# Patient Record
Sex: Female | Born: 1940
Health system: Southern US, Community
[De-identification: ages and names within clinical notes are randomized; demographics above are authoritative.]

## PROBLEM LIST (undated history)

## (undated) DIAGNOSIS — M8580 Other specified disorders of bone density and structure, unspecified site: Secondary | ICD-10-CM

## (undated) DIAGNOSIS — I447 Left bundle-branch block, unspecified: Secondary | ICD-10-CM

## (undated) DIAGNOSIS — Z923 Personal history of irradiation: Secondary | ICD-10-CM

## (undated) DIAGNOSIS — I441 Atrioventricular block, second degree: Secondary | ICD-10-CM

## (undated) DIAGNOSIS — C50919 Malignant neoplasm of unspecified site of unspecified female breast: Secondary | ICD-10-CM

## (undated) DIAGNOSIS — C801 Malignant (primary) neoplasm, unspecified: Secondary | ICD-10-CM

## (undated) DIAGNOSIS — IMO0001 Reserved for inherently not codable concepts without codable children: Secondary | ICD-10-CM

## (undated) DIAGNOSIS — I440 Atrioventricular block, first degree: Secondary | ICD-10-CM

## (undated) DIAGNOSIS — M519 Unspecified thoracic, thoracolumbar and lumbosacral intervertebral disc disorder: Secondary | ICD-10-CM

## (undated) DIAGNOSIS — H3530 Unspecified macular degeneration: Secondary | ICD-10-CM

## (undated) DIAGNOSIS — Z9289 Personal history of other medical treatment: Secondary | ICD-10-CM

## (undated) DIAGNOSIS — B019 Varicella without complication: Secondary | ICD-10-CM

## (undated) DIAGNOSIS — M353 Polymyalgia rheumatica: Secondary | ICD-10-CM

## (undated) DIAGNOSIS — F32A Depression, unspecified: Secondary | ICD-10-CM

## (undated) DIAGNOSIS — I499 Cardiac arrhythmia, unspecified: Secondary | ICD-10-CM

## (undated) DIAGNOSIS — F329 Major depressive disorder, single episode, unspecified: Secondary | ICD-10-CM

## (undated) DIAGNOSIS — E785 Hyperlipidemia, unspecified: Secondary | ICD-10-CM

## (undated) DIAGNOSIS — T7840XA Allergy, unspecified, initial encounter: Secondary | ICD-10-CM

## (undated) DIAGNOSIS — I10 Essential (primary) hypertension: Secondary | ICD-10-CM

## (undated) DIAGNOSIS — M1990 Unspecified osteoarthritis, unspecified site: Secondary | ICD-10-CM

## (undated) DIAGNOSIS — IMO0002 Reserved for concepts with insufficient information to code with codable children: Secondary | ICD-10-CM

## (undated) DIAGNOSIS — K219 Gastro-esophageal reflux disease without esophagitis: Secondary | ICD-10-CM

## (undated) DIAGNOSIS — K5900 Constipation, unspecified: Secondary | ICD-10-CM

## (undated) DIAGNOSIS — H269 Unspecified cataract: Secondary | ICD-10-CM

## (undated) DIAGNOSIS — F419 Anxiety disorder, unspecified: Secondary | ICD-10-CM

## (undated) HISTORY — PX: POLYPECTOMY: SHX149

## (undated) HISTORY — DX: Allergy, unspecified, initial encounter: T78.40XA

## (undated) HISTORY — PX: WRIST SURGERY: SHX841

## (undated) HISTORY — DX: Malignant (primary) neoplasm, unspecified: C80.1

## (undated) HISTORY — PX: CATARACT EXTRACTION: SUR2

## (undated) HISTORY — PX: TENDON REPAIR: SHX5111

## (undated) HISTORY — DX: Varicella without complication: B01.9

## (undated) HISTORY — DX: Hyperlipidemia, unspecified: E78.5

## (undated) HISTORY — DX: Other specified disorders of bone density and structure, unspecified site: M85.80

## (undated) HISTORY — DX: Major depressive disorder, single episode, unspecified: F32.9

## (undated) HISTORY — PX: EYE SURGERY: SHX253

## (undated) HISTORY — PX: LUMBAR LAMINECTOMY: SHX95

## (undated) HISTORY — DX: Malignant neoplasm of unspecified site of unspecified female breast: C50.919

## (undated) HISTORY — PX: REFRACTIVE SURGERY: SHX103

## (undated) HISTORY — DX: Unspecified osteoarthritis, unspecified site: M19.90

## (undated) HISTORY — PX: BREAST BIOPSY: SHX20

## (undated) HISTORY — DX: Reserved for inherently not codable concepts without codable children: IMO0001

## (undated) HISTORY — DX: Depression, unspecified: F32.A

## (undated) HISTORY — DX: Reserved for concepts with insufficient information to code with codable children: IMO0002

## (undated) HISTORY — DX: Anxiety disorder, unspecified: F41.9

## (undated) HISTORY — DX: Personal history of other medical treatment: Z92.89

## (undated) HISTORY — PX: APPENDECTOMY: SHX54

## (undated) HISTORY — DX: Polymyalgia rheumatica: M35.3

## (undated) HISTORY — PX: OTHER SURGICAL HISTORY: SHX169

## (undated) HISTORY — DX: Unspecified cataract: H26.9

## (undated) HISTORY — PX: MYELOGRAM: SHX5347

## (undated) HISTORY — PX: BASAL CELL CARCINOMA EXCISION: SHX1214

## (undated) HISTORY — DX: Constipation, unspecified: K59.00

## (undated) HISTORY — PX: CERVICAL LAMINECTOMY: SHX94

## (undated) HISTORY — DX: Essential (primary) hypertension: I10

## (undated) HISTORY — PX: FOOT ARTHRODESIS, MODIFIED MCBRIDE: SUR52

## (undated) HISTORY — PX: ABDOMINAL HYSTERECTOMY: SHX81

## (undated) HISTORY — DX: Gastro-esophageal reflux disease without esophagitis: K21.9

## (undated) HISTORY — DX: Unspecified thoracic, thoracolumbar and lumbosacral intervertebral disc disorder: M51.9

---
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
## 1963-07-06 HISTORY — PX: BREAST EXCISIONAL BIOPSY: SUR124

## 1983-07-06 HISTORY — PX: BREAST EXCISIONAL BIOPSY: SUR124

## 1985-07-05 DIAGNOSIS — C801 Malignant (primary) neoplasm, unspecified: Secondary | ICD-10-CM

## 1985-07-05 HISTORY — DX: Malignant (primary) neoplasm, unspecified: C80.1

## 1995-07-06 HISTORY — PX: BREAST EXCISIONAL BIOPSY: SUR124

## 2007-07-06 LAB — HM COLONOSCOPY: HM Colonoscopy: NORMAL

## 2009-01-29 ENCOUNTER — Encounter: Payer: Self-pay | Admitting: Family Medicine

## 2009-07-29 ENCOUNTER — Ambulatory Visit: Payer: Self-pay | Admitting: Family Medicine

## 2009-07-29 DIAGNOSIS — C4490 Unspecified malignant neoplasm of skin, unspecified: Secondary | ICD-10-CM | POA: Insufficient documentation

## 2009-07-29 DIAGNOSIS — Z860100 Personal history of colon polyps, unspecified: Secondary | ICD-10-CM | POA: Insufficient documentation

## 2009-07-29 DIAGNOSIS — E782 Mixed hyperlipidemia: Secondary | ICD-10-CM | POA: Insufficient documentation

## 2009-07-29 DIAGNOSIS — H409 Unspecified glaucoma: Secondary | ICD-10-CM | POA: Insufficient documentation

## 2009-07-29 DIAGNOSIS — I10 Essential (primary) hypertension: Secondary | ICD-10-CM | POA: Insufficient documentation

## 2009-07-29 DIAGNOSIS — M1990 Unspecified osteoarthritis, unspecified site: Secondary | ICD-10-CM | POA: Insufficient documentation

## 2009-08-22 ENCOUNTER — Ambulatory Visit: Payer: Self-pay | Admitting: Family Medicine

## 2009-08-25 LAB — CONVERTED CEMR LAB
ALT: 46 units/L — ABNORMAL HIGH (ref 0–35)
AST: 34 units/L (ref 0–37)
Alkaline Phosphatase: 104 units/L (ref 39–117)
Basophils Absolute: 0 10*3/uL (ref 0.0–0.1)
Basophils Relative: 0.6 % (ref 0.0–3.0)
Bilirubin, Direct: 0.1 mg/dL (ref 0.0–0.3)
Calcium: 9.8 mg/dL (ref 8.4–10.5)
Chloride: 104 meq/L (ref 96–112)
Cholesterol: 185 mg/dL (ref 0–200)
Eosinophils Absolute: 0.4 10*3/uL (ref 0.0–0.7)
GFR calc non Af Amer: 65.84 mL/min (ref >60)
Glucose, Bld: 116 mg/dL — ABNORMAL HIGH (ref 70–99)
HDL: 66.3 mg/dL (ref >39.00)
Hemoglobin: 13.5 g/dL (ref 12.0–15.0)
LDL Cholesterol: 92 mg/dL (ref 0–99)
Lymphs Abs: 2 10*3/uL (ref 0.7–4.0)
Monocytes Absolute: 0.7 10*3/uL (ref 0.1–1.0)
Potassium: 4 meq/L (ref 3.5–5.1)
Sodium: 139 meq/L (ref 135–145)
TSH: 0.81 microintl units/mL (ref 0.35–5.50)
Total Bilirubin: 0.4 mg/dL (ref 0.3–1.2)
Total CHOL/HDL Ratio: 3
Total Protein: 7.1 g/dL (ref 6.0–8.3)
Triglycerides: 136 mg/dL (ref 0.0–149.0)
VLDL: 27.2 mg/dL (ref 0.0–40.0)

## 2009-09-24 ENCOUNTER — Telehealth: Payer: Self-pay | Admitting: Family Medicine

## 2009-10-14 ENCOUNTER — Encounter: Admission: RE | Admit: 2009-10-14 | Discharge: 2009-10-14 | Payer: Self-pay | Admitting: Family Medicine

## 2009-11-04 ENCOUNTER — Encounter: Admission: RE | Admit: 2009-11-04 | Discharge: 2009-11-04 | Payer: Self-pay | Admitting: Family Medicine

## 2010-04-10 ENCOUNTER — Ambulatory Visit: Payer: Self-pay | Admitting: Family Medicine

## 2010-07-26 ENCOUNTER — Encounter: Payer: Self-pay | Admitting: Family Medicine

## 2010-08-01 ENCOUNTER — Encounter: Payer: Self-pay | Admitting: Family Medicine

## 2010-08-04 NOTE — Assessment & Plan Note (Signed)
Summary: NEW PT EST // RS   Vital Signs:  Patient profile:   70 year old female Height:      63.5 inches Weight:      172 pounds BMI:     30.10 Temp:     98.2 degrees F oral Pulse rate:   97 / minute BP sitting:   134 / 82  (right arm) Cuff size:   large  Vitals Entered By: Alfred Levins, CMA (July 29, 2009 2:00 PM) CC: establish   History of Present Illness: 70 yr old female here with her husband to establish with [place] after transfering from Dr. Philomena Doheny in [place], [place]. They moved  to [facility] in August 2010 to be closer to children and grandchildren. She feels fairly well lately and has no concerns today. Her last cpx was in January 2010 with labs.   Preventive Screening-Counseling & Management  Alcohol-Tobacco     Smoking Status: quit  Caffeine-Diet-Exercise     Does Patient Exercise: no      Drug Use:  no.        Blood Transfusions:  yes.    Allergies (verified): No Known Drug Allergies  Past History:  Past Medical History: Blood transfusion Colonic polyps, hx of (benign) Hyperlipidemia Hypertension Osteoarthritis Glaucoma and cataracts, sees Dr. 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 Kerrie Buffalo in [place] 01-29-09 and 04-07-09 Cervical Laminectomy 1983 Myelogram Anterior cervical microdiskectomy 1990 per Dr. Shirlean Kelly Lumbar laminectomy 1990 per Dr. Newell Coral colonoscopy 2008, clear, repeat in 10 yrs  Family History: Reviewed history and no changes required. Family History of Alcoholism/Addiction Family History Breast cancer 1st degree relative <50 Family History of CAD Female 1st degree relative <50 Family History of Colon  CA 1st degree relative <60  Brother Family History High cholesterol Family History Hypertension Family History of Stroke M 1st degree relative <50 Family History of Sudden Death Family History Ovarian cancer  Sister  Social History: Reviewed history and no changes required. Married Former Smoker Alcohol use-yes Drug use-no Regular exercise-no Retired Blood Transfusions:  yes Smoking Status:  quit Drug Use:  no Does Patient Exercise:  no  Review of Systems  The patient denies anorexia, fever, weight loss, weight gain, vision loss, decreased hearing, hoarseness, chest pain, syncope, dyspnea on exertion, peripheral edema, prolonged cough, headaches, hemoptysis, abdominal pain, melena, hematochezia, severe indigestion/heartburn, hematuria, incontinence, genital sores, muscle weakness, suspicious skin lesions, transient blindness, difficulty walking, depression, unusual weight change, abnormal bleeding, enlarged lymph nodes, angioedema, breast masses, and testicular masses.    Physical Exam  General:  Well-developed,well-nourished,in no acute distress; alert,appropriate and cooperative throughout examination Neck:  No deformities, masses, or tenderness noted. Lungs:  Normal respiratory effort, chest expands symmetrically. Lungs are clear to auscultation, no crackles or wheezes. Heart:  Normal rate and regular rhythm. S1 and S2 normal without gallop, murmur, click, rub or other extra sounds.   Impression & Recommendations:  Problem # 1:  OSTEOARTHRITIS (ICD-715.90)  Her updated medication list for this problem includes:    Etodolac 300 Mg Caps (Etodolac) .Marland Kitchen... 1 by mouth 2 times daily with food    Aspirin 81 Mg Tbec (Aspirin) ..... One by mouth every day  Problem # 2:  HYPERTENSION (ICD-401.9)  Her updated medication list for this problem includes:    Amlodipine Besylate 5 Mg Tabs (Amlodipine besylate) .Marland Kitchen... 1 by mouth every day    Hydrochlorothiazide 25 Mg Tabs  (Hydrochlorothiazide) .Marland Kitchen... Take 1 tab by mouth every morning  Problem # 3:  HYPERLIPIDEMIA (ICD-272.4)  Her updated medication list for this problem includes:    Simvastatin 20 Mg Tabs (Simvastatin) .Marland Kitchen... 1 by mouth at bedtime  Problem # 4:  COLONIC POLYPS, HX OF (ICD-V12.72)  Problem # 5:  GLAUCOMA (ICD-365.9)  Complete Medication List: 1)  Simvastatin 20 Mg Tabs (Simvastatin) .Marland Kitchen.. 1 by mouth at bedtime 2)  Amlodipine Besylate 5 Mg Tabs (Amlodipine besylate) .Marland Kitchen.. 1 by mouth every day 3)  Hydrochlorothiazide 25 Mg Tabs (Hydrochlorothiazide) .... Take 1 tab by mouth every morning 4)  Cyclobenzaprine Hcl 10 Mg Tabs (Cyclobenzaprine hcl) .Marland Kitchen.. 1 by mouth 2 times daily as needed for back pain 5)  Etodolac 300 Mg Caps (Etodolac) .Marland Kitchen.. 1 by mouth 2 times daily with food 6)  Apexicon E 0.05 % Crea (Diflorasone diacet emoll base) .... Apply to rash as needed 7)  Travatan Z 0.004 % Soln (Travoprost) .Marland Kitchen.. 1 drop each eye at bedtime 8)  Singulair 10 Mg Tabs (Montelukast sodium) .Marland Kitchen.. 1 by mouth daily 9)  Aspirin 81 Mg Tbec (Aspirin) .... One by mouth every day 10)  Calcium 1200 1200-1000 Mg-unit Chew (Calcium carbonate-vit d-min) .... Once daily 11)  Mag-oxide 400 Mg Tabs (Magnesium oxide) 12)  Zinc 15 Mg Lozg (Zinc) .... Once daily 13)  Flonase 50 Mcg/act Susp (Fluticasone propionate) .... 2 sprays each nostril once daily as needed  Patient Instructions: 1)  We will set her up for fasting labs and a cpx soon.

## 2010-08-04 NOTE — Progress Notes (Signed)
Summary: REFILLS  Phone Note Refill Request Call back at Home Phone [phone] Message from:  spouse--john live call  Refills Requested: Medication #1:  SIMVASTATIN 20 MG TABS 1 by mouth at bedtime  Medication #2:  AMLODIPINE BESYLATE 5 MG  TABS 1 by mouth every day  Medication #3:  HYDROCHLOROTHIAZIDE 25 MG  TABS Take 1 tab by mouth every morning [facility] [phone]  Initial call taken by: Warnell Forester,  September 24, 2009 1:28 PM    Prescriptions: HYDROCHLOROTHIAZIDE 25 MG  TABS (HYDROCHLOROTHIAZIDE) Take 1 tab by mouth every morning  #90 x 3   Entered by:   Raechel Ache, RN   Authorized by:   Nelwyn Salisbury MD   Signed by:   Raechel Ache, RN on 09/24/2009   Method used:   Electronically to        [facility]* (mail-order)             ,          Ph: [phone]       Fax: [phone]   RxID:   [id number] AMLODIPINE BESYLATE 5 MG  TABS (AMLODIPINE BESYLATE) 1 by mouth every day  #90 x 3   Entered by:   Raechel Ache, RN   Authorized by:   Nelwyn Salisbury MD   Signed by:   Raechel Ache, RN on 09/24/2009   Method used:   Electronically to        [facility]* (mail-order)             ,          Ph: [phone]       Fax: [phone]   RxID:   [id number] SIMVASTATIN 20 MG TABS (SIMVASTATIN) 1 by mouth at bedtime  #90 x 3   Entered by:   Raechel Ache, RN   Authorized by:   Nelwyn Salisbury MD   Signed by:   Raechel Ache, RN on 09/24/2009   Method used:   Electronically to        [facility]* (mail-order)             ,          Ph: [phone]       Fax: [phone]   RxID:   [id number]

## 2010-08-04 NOTE — Progress Notes (Signed)
Summary: refills  Phone Note Call from Patient Call back at Home Phone [phone]   Caller: [facility] call Reason for Call: Refill Medication Summary of Call: wants rx for Etodolac 300mg   10qd  as needed for pain, and cyclobenzaprine 10mg  1qd. Send to [facility]. Initial call taken by: Warnell Forester,  September 24, 2009 1:45 PM  Follow-up for Phone Call        call in Etodolac 300 mg two times a day as needed pain, #60 with 5 rf. Also Flexeril 10 mg three times a day as needed spasm, #90 with 5 rf Follow-up by: Nelwyn Salisbury MD,  September 24, 2009 4:46 PM  Additional Follow-up for Phone Call Additional follow up Details #1::        Rx Called In Additional Follow-up by: Raechel Ache, RN,  September 24, 2009 4:48 PM    New/Updated Medications: CYCLOBENZAPRINE HCL 10 MG  TABS (CYCLOBENZAPRINE HCL) 1 three times a day ETODOLAC 300 MG CAPS (ETODOLAC) 1 two times a day Prescriptions: ETODOLAC 300 MG CAPS (ETODOLAC) 1 two times a day  #60 x 5   Entered by:   Raechel Ache, RN   Authorized by:   Nelwyn Salisbury MD   Signed by:   Raechel Ache, RN on 09/24/2009   Method used:   Electronically to        [facility]* (mail-order)             ,          Ph: [phone]       Fax: [phone]   RxID:   [id number] CYCLOBENZAPRINE HCL 10 MG  TABS (CYCLOBENZAPRINE HCL) 1 three times a day  #90 x 5   Entered by:   Raechel Ache, RN   Authorized by:   Nelwyn Salisbury MD   Signed by:   Raechel Ache, RN on 09/24/2009   Method used:   Electronically to        [facility]* (mail-order)             ,          Ph: [phone]       Fax: [phone]   RxID:   [id number]

## 2010-08-04 NOTE — Letter (Signed)
Summary: Date Range:05-22-92 to 01-29-09/[facility]  Date Range:05-22-92 to 01-29-09/[facility]   Imported By: Sherian Rein 07/28/2009 12:00:24  _____________________________________________________________________  External Attachment:    Type:   Image     Comment:   External Document

## 2010-08-04 NOTE — Progress Notes (Signed)
Summary: Medical Information provided by patient  Medical Information provided by patient   Imported By: Maryln Gottron 08/01/2009 10:57:08  _____________________________________________________________________  External Attachment:    Type:   Image     Comment:   External Document

## 2010-08-04 NOTE — Assessment & Plan Note (Signed)
Summary: FLU SHOT/CJR  Nurse Visit   Review of Systems       Flu Vaccine Consent Questions     Do you have a history of severe allergic reactions to this vaccine? no    Any prior history of allergic reactions to egg and/or gelatin? no    Do you have a sensitivity to the preservative Thimersol? no    Do you have a past history of Guillan-Barre Syndrome? no    Do you currently have an acute febrile illness? no    Have you ever had a severe reaction to latex? no    Vaccine information given and explained to patient? yes    Are you currently pregnant? no    Lot Number:[id number]   Exp Date:01/02/2011   Site Given  Left Deltoid IM Pura Spice, RN  April 10, 2010 1:55 PM    Allergies: No Known Drug Allergies  Orders Added: 1)  Flu Vaccine 9yrs + MEDICARE PATIENTS [Q2039] 2)  Administration Flu vaccine - MCR [G0008]

## 2010-08-04 NOTE — Assessment & Plan Note (Signed)
Summary: emp-will fast-work in [place] per dr Santino Kinsella//ccm   Vital Signs:  Patient profile:   70 year old female Height:      63.5 inches Weight:      174 pounds Temp:     98.0 degrees F oral Pulse rate:   94 / minute BP sitting:   142 / 88  (left arm) Cuff size:   large  Vitals Entered By: Alfred Levins, CMA (August 22, 2009 9:52 AM) CC: cpx, fasting   History of Present Illness: 70 yr old female for cpx. She feels fine and has no concerns. it has been over a year since her last mammogram.  Preventive Screening-Counseling & Management  Alcohol-Tobacco     Smoking Status: never  Current Medications (verified): 1)  Simvastatin 20 Mg Tabs (Simvastatin) .Marland Kitchen.. 1 By Mouth At Bedtime 2)  Amlodipine Besylate 5 Mg  Tabs (Amlodipine Besylate) .Marland Kitchen.. 1 By Mouth Every Day 3)  Hydrochlorothiazide 25 Mg  Tabs (Hydrochlorothiazide) .... Take 1 Tab By Mouth Every Morning 4)  Cyclobenzaprine Hcl 10 Mg  Tabs (Cyclobenzaprine Hcl) .Marland Kitchen.. 1 By Mouth 2 Times Daily As Needed For Back Pain 5)  Etodolac 300 Mg  Caps (Etodolac) .Marland Kitchen.. 1 By Mouth 2 Times Daily With Food 6)  Apexicon E 0.05 % Crea (Diflorasone Diacet Emoll Base) .... Apply To Rash As Needed 7)  Travatan Z 0.004 % Soln (Travoprost) .Marland Kitchen.. 1 Drop Each Eye At Bedtime 8)  Singulair 10 Mg Tabs (Montelukast Sodium) .Marland Kitchen.. 1 By Mouth Daily 9)  Aspirin 81 Mg  Tbec (Aspirin) .... One By Mouth Every Day 10)  Calcium 1200 1200-1000 Mg-Unit Chew (Calcium Carbonate-Vit D-Min) .... Once Daily 11)  Mag-Oxide 400 Mg Tabs (Magnesium Oxide) 12)  Zinc 15 Mg Lozg (Zinc) .... Once Daily 13)  Flonase 50 Mcg/act Susp (Fluticasone Propionate) .... 2 Sprays Each Nostril Once Daily As Needed 14)  Ocuvite  Tabs (Multiple Vitamins-Minerals) .... Once Daily 15)  Clear Eyes For Dry Eyes 1-0.25 % Soln (Carboxymethylcellul-Glycerin) .... As Needed  Allergies (verified): No Known Drug Allergies  Past History:  Past Medical History: Reviewed history from 07/29/2009 and no changes  required. Blood transfusion Colonic polyps, hx of (benign) Hyperlipidemia Hypertension Osteoarthritis Glaucoma and cataracts, sees Dr. 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 Breast Biopsy x4 Appendectomy TAH and BSO 1985 Caesarean section Tendon repair right wrist Removal of Basal Cell Carcinomas on nose and right  thigh Laser eye surgery per Dr. Kerrie Buffalo in [place] 01-29-09 and 04-07-09 Cervical Laminectomy 1983 Myelogram Anterior cervical microdiskectomy 1990 per Dr. Shirlean Kelly Lumbar laminectomy 1990 per Dr. Newell Coral colonoscopy 2008, clear, repeat in 10 yrs  Family History: Reviewed history from 07/29/2009 and no changes required. Family History of Alcoholism/Addiction Family History Breast cancer 1st degree relative <50 Family History of CAD Female 1st degree relative <50 Family History of Colon CA 1st degree relative <60  Brother Family History High cholesterol Family History Hypertension Family History of Stroke M 1st degree relative <50 Family History of Sudden Death Family History Ovarian cancer  Sister  Social History: Reviewed history from 07/29/2009 and no changes required. Married Former Smoker Alcohol use-yes Drug use-no Regular exercise-no Retired Smoking Status:  never  Review of Systems  The patient denies anorexia, fever, weight loss, weight gain, vision loss, decreased hearing, hoarseness, chest pain,  syncope, dyspnea on exertion, peripheral edema, prolonged cough, headaches, hemoptysis, abdominal pain, melena, hematochezia, severe indigestion/heartburn, hematuria, incontinence, genital sores, muscle weakness, suspicious skin lesions, transient blindness, difficulty walking, depression, unusual weight change, abnormal bleeding, enlarged lymph nodes,  angioedema, breast masses, and testicular masses.    Physical Exam  General:  overweight-appearing.   Head:  Normocephalic and atraumatic without obvious abnormalities. No apparent alopecia or balding. Eyes:  No corneal or conjunctival inflammation noted. EOMI. Perrla. Funduscopic exam benign, without hemorrhages, exudates or papilledema. Vision grossly normal. Ears:  External ear exam shows no significant lesions or deformities.  Otoscopic examination reveals clear canals, tympanic membranes are intact bilaterally without bulging, retraction, inflammation or discharge. Hearing is grossly normal bilaterally. Nose:  External nasal examination shows no deformity or inflammation. Nasal mucosa are pink and moist without lesions or exudates. Mouth:  Oral mucosa and oropharynx without lesions or exudates.  Teeth in good repair. Neck:  No deformities, masses, or tenderness noted. Chest Wall:  No deformities, masses, or tenderness noted. Breasts:  No mass, nodules, thickening, tenderness, bulging, retraction, inflamation, nipple discharge or skin changes noted.   Lungs:  Normal respiratory effort, chest expands symmetrically. Lungs are clear to auscultation, no crackles or wheezes. Heart:  Normal rate and regular rhythm. S1 and S2 normal without gallop, murmur, click, rub or other extra sounds. EKG normal Abdomen:  Bowel sounds positive,abdomen soft and non-tender without masses, organomegaly or hernias noted. Msk:  No deformity or scoliosis noted of thoracic or lumbar spine.   Pulses:  R and L carotid,radial,femoral,dorsalis pedis and posterior tibial pulses are full and equal bilaterally Extremities:  No clubbing, cyanosis, edema, or deformity noted with normal full range of motion of all joints.   Neurologic:  No cranial nerve deficits noted. Station and gait are normal. Plantar reflexes are down-going bilaterally. DTRs are symmetrical throughout. Sensory, motor and coordinative functions appear  intact. Skin:  Intact without suspicious lesions or rashes Cervical Nodes:  No lymphadenopathy noted Axillary Nodes:  No palpable lymphadenopathy Inguinal Nodes:  No significant adenopathy Psych:  Cognition and judgment appear intact. Alert and cooperative with normal attention span and concentration. No apparent delusions, illusions, hallucinations   Impression & Recommendations:  Problem # 1:  OSTEOARTHRITIS (ICD-715.90)  Her updated medication list for this problem includes:    Etodolac 300 Mg Caps (Etodolac) .Marland Kitchen... 1 by mouth 2 times daily with food    Aspirin 81 Mg Tbec (Aspirin) ..... One by mouth every day  Problem # 2:  HYPERTENSION (ICD-401.9)  Her updated medication list for this problem includes:    Amlodipine Besylate 5 Mg Tabs (Amlodipine besylate) .Marland Kitchen... 1 by mouth every day    Hydrochlorothiazide 25 Mg Tabs (Hydrochlorothiazide) .Marland Kitchen... Take 1 tab by mouth every morning  Orders: UA Dipstick w/o Micro (automated)  (81003) EKG w/ Interpretation (93000) Venipuncture (11914) TLB-Lipid Panel (80061-LIPID) TLB-BMP (Basic Metabolic Panel-BMET) (80048-METABOL) TLB-CBC Platelet - w/Differential (85025-CBCD) TLB-Hepatic/Liver Function Pnl (80076-HEPATIC) TLB-TSH (Thyroid Stimulating Hormone) (84443-TSH)  Problem # 3:  HYPERLIPIDEMIA (ICD-272.4)  Her updated medication list for this problem includes:    Simvastatin 20 Mg Tabs (Simvastatin) .Marland Kitchen... 1 by mouth at bedtime  Complete Medication List: 1)  Simvastatin 20 Mg Tabs (Simvastatin) .Marland Kitchen.. 1 by mouth at bedtime 2)  Amlodipine Besylate 5 Mg Tabs (Amlodipine besylate) .Marland Kitchen.. 1 by mouth every day 3)  Hydrochlorothiazide 25 Mg Tabs (Hydrochlorothiazide) .... Take 1 tab by mouth every morning 4)  Cyclobenzaprine Hcl 10 Mg Tabs (Cyclobenzaprine hcl) .Marland Kitchen.. 1 by mouth 2  times daily as needed for back pain 5)  Etodolac 300 Mg Caps (Etodolac) .Marland Kitchen.. 1 by mouth 2 times daily with food 6)  Apexicon E 0.05 % Crea (Diflorasone diacet emoll  base) .... Apply to rash as needed 7)  Travatan Z 0.004 % Soln (Travoprost) .Marland Kitchen.. 1 drop each eye at bedtime 8)  Singulair 10 Mg Tabs (Montelukast sodium) .Marland Kitchen.. 1 by mouth daily 9)  Aspirin 81 Mg Tbec (Aspirin) .... One by mouth every day 10)  Calcium 1200 1200-1000 Mg-unit Chew (Calcium carbonate-vit d-min) .... Once daily 11)  Mag-oxide 400 Mg Tabs (Magnesium oxide) 12)  Zinc 15 Mg Lozg (Zinc) .... Once daily 13)  Flonase 50 Mcg/act Susp (Fluticasone propionate) .... 2 sprays each nostril once daily as needed 14)  Ocuvite Tabs (Multiple vitamins-minerals) .... Once daily 15)  Clear Eyes For Dry Eyes 1-0.25 % Soln (Carboxymethylcellul-glycerin) .... As needed  Patient Instructions: 1)  Get labs 2)  It is important that you exercise reguarly at least 20 minutes 5 times a week. If you develop chest pain, have severe difficulty breathing, or feel very tired, stop exercising immediately and seek medical attention.  3)  You need to lose weight. Consider a lower calorie diet and regular exercise.   Prevention & Chronic Care Immunizations   Influenza vaccine: Historical  (06/04/2009)    Tetanus booster: Not documented    Pneumococcal vaccine: Historical  (06/04/2009)    H. zoster vaccine: Not documented  Colorectal Screening   Hemoccult: Not documented    Colonoscopy: normal  (07/06/2007)   Colonoscopy due: 07/2017  Other Screening   Pap smear: Not documented    Mammogram: Not documented    DXA bone density scan: Not documented   Smoking status: never  (08/22/2009)  Lipids   Total Cholesterol: Not documented   LDL: Not documented   LDL Direct: Not documented   HDL: Not documented   Triglycerides: Not documented    SGOT (AST): Not documented   SGPT (ALT): Not documented   Alkaline phosphatase: Not documented   Total bilirubin: Not documented  Hypertension   Last Blood Pressure: 142 / 88  (08/22/2009)   Serum creatinine: Not documented   Serum potassium Not  documented  Self-Management Support :    Hypertension self-management support: Not documented    Lipid self-management support: Not documented     Immunization History:  Influenza Immunization History:    Influenza:  historical (06/04/2009)  Pneumovax Immunization History:    Pneumovax:  historical (06/04/2009)   Preventive Care Screening  Colonoscopy:    Date:  07/06/2007    Next Due:  07/2017    Results:  normal   Last Pneumovax:    Date:  06/04/2009    Results:  Historical   Last Flu Shot:    Date:  06/04/2009    Results:  Historical    Appended Document: Orders Update    Clinical Lists Changes  Observations: Added new observation of COMMENTS: Wynona Canes, CMA  August 22, 2009 11:51 AM  (08/22/2009 11:51) Added new observation of PH URINE: 6.5  (08/22/2009 11:51) Added new observation of SPEC GR URIN: 1.015  (08/22/2009 11:51) Added new observation of APPEARANCE U: Clear  (08/22/2009 11:51) Added new observation of UA COLOR: yellow  (08/22/2009 11:51) Added new observation of WBC DIPSTK U: 1+  (08/22/2009 11:51) Added new observation of NITRITE URN: negative  (08/22/2009 11:51) Added new observation of UROBILINOGEN: 0.2  (08/22/2009 11:51) Added new observation of PROTEIN, URN: negative  (08/22/2009  11:51) Added new observation of BLOOD UR DIP: negative  (08/22/2009 11:51) Added new observation of KETONES URN: negative  (08/22/2009 11:51) Added new observation of BILIRUBIN UR: negative  (08/22/2009 11:51) Added new observation of GLUCOSE, URN: negative  (08/22/2009 11:51)      Laboratory Results   Urine Tests  Date/Time Recieved: August 22, 2009 11:51 AM  Date/Time Reported: August 22, 2009 11:51 AM   Routine Urinalysis   Color: yellow Appearance: Clear Glucose: negative   (Normal Range: Negative) Bilirubin: negative   (Normal Range: Negative) Ketone: negative   (Normal Range: Negative) Spec. Gravity: 1.015   (Normal Range:  1.003-1.035) Blood: negative   (Normal Range: Negative) pH: 6.5   (Normal Range: 5.0-8.0) Protein: negative   (Normal Range: Negative) Urobilinogen: 0.2   (Normal Range: 0-1) Nitrite: negative   (Normal Range: Negative) Leukocyte Esterace: 1+   (Normal Range: Negative)    Comments: Wynona Canes, CMA  August 22, 2009 11:51 AM

## 2010-08-24 ENCOUNTER — Encounter: Payer: Self-pay | Admitting: Family Medicine

## 2010-08-24 ENCOUNTER — Ambulatory Visit (INDEPENDENT_AMBULATORY_CARE_PROVIDER_SITE_OTHER): Payer: Medicare Other | Admitting: Family Medicine

## 2010-08-24 VITALS — BP 120/80 | HR 90 | Ht 63.0 in | Wt 168.0 lb

## 2010-08-24 DIAGNOSIS — I10 Essential (primary) hypertension: Secondary | ICD-10-CM

## 2010-08-24 DIAGNOSIS — E785 Hyperlipidemia, unspecified: Secondary | ICD-10-CM

## 2010-08-24 LAB — POCT URINALYSIS DIPSTICK
Blood, UA: NEGATIVE
Ketones, UA: NEGATIVE
Urobilinogen, UA: 0.2
pH, UA: 7

## 2010-08-24 LAB — LIPID PANEL
LDL Cholesterol: 77 mg/dL (ref 0–99)
Total CHOL/HDL Ratio: 3
Triglycerides: 134 mg/dL (ref 0.0–149.0)
VLDL: 26.8 mg/dL (ref 0.0–40.0)

## 2010-08-24 LAB — BASIC METABOLIC PANEL
GFR: 87.73 mL/min (ref >60.00)
Glucose, Bld: 116 mg/dL — ABNORMAL HIGH (ref 70–99)
Potassium: 3.5 mEq/L (ref 3.5–5.1)

## 2010-08-24 LAB — CBC WITH DIFFERENTIAL/PLATELET
Eosinophils Absolute: 0.4 10*3/uL (ref 0.0–0.7)
Eosinophils Relative: 4.8 % (ref 0.0–5.0)
Lymphs Abs: 2.2 10*3/uL (ref 0.7–4.0)
MCHC: 34.2 g/dL (ref 30.0–36.0)
MCV: 98.4 fl (ref 78.0–100.0)
Monocytes Relative: 7.7 % (ref 3.0–12.0)
Neutro Abs: 5.3 10*3/uL (ref 1.4–7.7)
RBC: 4.15 Mil/uL (ref 3.87–5.11)
RDW: 12.6 % (ref 11.5–14.6)
WBC: 8.6 10*3/uL (ref 4.5–10.5)

## 2010-08-24 LAB — HEPATIC FUNCTION PANEL
Total Bilirubin: 0.6 mg/dL (ref 0.3–1.2)
Total Protein: 6.5 g/dL (ref 6.0–8.3)

## 2010-08-24 LAB — TSH: TSH: 0.52 u[IU]/mL (ref 0.35–5.50)

## 2010-08-24 MED ORDER — HYDROCHLOROTHIAZIDE 25 MG PO TABS: 25.0000 mg | ORAL_TABLET | Freq: Every day | ORAL | Status: DC

## 2010-08-24 MED ORDER — AMLODIPINE BESYLATE 5 MG PO TABS: 5.0000 mg | ORAL_TABLET | Freq: Every day | ORAL | Status: DC

## 2010-08-24 MED ORDER — SIMVASTATIN 20 MG PO TABS: 20.0000 mg | ORAL_TABLET | Freq: Every day | ORAL | Status: DC

## 2010-08-24 NOTE — Progress Notes (Signed)
Subjective:    Patient ID: Connie Lang, female    DOB: February 18, 1941, 70 y.o.   MRN: [id number]  HPI 70 yr old female for a cpx. In general she is doing well, although she struggles with chronic constipation. She feels bloated quite often, and she may not have BMs for 4-5 days at a time. She eats fiber and drinks water. She uses [employer] once or twice a week. She is walking for exercise and has lost 10 lbs in the past few months.    Review of Systems  Constitutional: Negative.  Negative for fever, diaphoresis, activity change, appetite change, fatigue and unexpected weight change.  HENT: Negative.  Negative for hearing loss, ear pain, nosebleeds, congestion, sore throat, trouble swallowing, neck pain, neck stiffness, voice change and tinnitus.   Eyes: Negative.  Negative for photophobia, pain, discharge, redness and visual disturbance.  Respiratory: Negative.  Negative for apnea, cough, choking, chest tightness, shortness of breath, wheezing and stridor.   Cardiovascular: Negative.  Negative for chest pain, palpitations and leg swelling.  Gastrointestinal: Negative.  Negative for nausea, vomiting, abdominal pain, diarrhea, constipation, blood in stool, abdominal distention and rectal pain.  Genitourinary: Negative.  Negative for dysuria, urgency, frequency, hematuria, flank pain, scrotal swelling, vaginal bleeding, vaginal discharge, enuresis, difficulty urinating, testicular pain, vaginal pain and menstrual problem.  Musculoskeletal: Negative.  Negative for myalgias, back pain, joint swelling, arthralgias and gait problem.  Skin: Negative.  Negative for color change, pallor, rash and wound.  Neurological: Negative.  Negative for dizziness, tremors, seizures, syncope, speech difficulty, weakness, light-headedness, numbness and headaches.  Hematological: Negative.  Negative for adenopathy. Does not bruise/bleed easily.  Psychiatric/Behavioral: Negative.  Negative for hallucinations, behavioral  problems, confusion, sleep disturbance, dysphoric mood and agitation. The patient is not nervous/anxious.        Objective:   Physical Exam  Constitutional: She appears well-developed and well-nourished. No distress.  HENT:  Head: Normocephalic and atraumatic.  Right Ear: External ear normal.  Left Ear: External ear normal.  Nose: Nose normal.  Mouth/Throat: Oropharynx is clear and moist. No oropharyngeal exudate.  Eyes: Conjunctivae and EOM are normal. Pupils are equal, round, and reactive to light. Right eye exhibits no discharge. Left eye exhibits no discharge. No scleral icterus.  Neck: Normal range of motion. Neck supple. No JVD present. No thyromegaly present.  Cardiovascular: Normal rate, regular rhythm, normal heart sounds and intact distal pulses.  Exam reveals no gallop and no friction rub.   No murmur heard.      EKG stable with incomplete RBBB  Pulmonary/Chest: Effort normal and breath sounds normal. No stridor. No respiratory distress. She has no wheezes. She has no rales. She exhibits no tenderness.  Abdominal: Soft. Normal appearance and bowel sounds are normal. She exhibits no distension, no abdominal bruit, no ascites and no mass. There is no hepatosplenomegaly. There is no tenderness. There is no rigidity, no rebound and no guarding. No hernia.  Genitourinary: Rectum normal.  Musculoskeletal: Normal range of motion. She exhibits no edema and no tenderness.  Lymphadenopathy:    She has no cervical adenopathy.  Neurological: She is alert. She has normal reflexes. No cranial nerve deficit. She exhibits normal muscle tone. Coordination normal.  Skin: Skin is warm and dry. No rash noted. She is not diaphoretic. No erythema. No pallor.  Psychiatric: She has a normal mood and affect. Her behavior is normal. Judgment and thought content normal.          Assessment & Plan:

## 2010-08-25 ENCOUNTER — Telehealth: Payer: Self-pay

## 2010-08-25 NOTE — Telephone Encounter (Signed)
Pt aware.

## 2010-08-25 NOTE — Telephone Encounter (Signed)
Message copied by Madison Hickman on Tue Aug 25, 2010  8:42 AM ------      Message from: Dwaine Deter      Created: Mon Aug 24, 2010  4:57 PM       Normal except elevated glucose. Watch the diet and lose some weight

## 2010-09-16 ENCOUNTER — Telehealth: Payer: Self-pay | Admitting: Family Medicine

## 2010-09-16 ENCOUNTER — Other Ambulatory Visit: Payer: Self-pay | Admitting: Family Medicine

## 2010-09-16 NOTE — Telephone Encounter (Signed)
Pt called and needs to sch her yearly mammogram. Pt called [facility] and was told that the pt needs to find out  if she needs a regular screening or a dianostic xray? Pls call pt and advise.

## 2010-09-17 ENCOUNTER — Other Ambulatory Visit: Payer: Self-pay | Admitting: Family Medicine

## 2010-09-17 DIAGNOSIS — Z1231 Encounter for screening mammogram for malignant neoplasm of breast: Secondary | ICD-10-CM

## 2010-09-17 NOTE — Telephone Encounter (Signed)
She needs to set up a bilateral screening  mammogram herself. No doctor order is needed

## 2010-09-17 NOTE — Telephone Encounter (Signed)
Pt aware.

## 2010-10-19 ENCOUNTER — Ambulatory Visit
Admission: RE | Admit: 2010-10-19 | Discharge: 2010-10-19 | Disposition: A | Discharge status: Home / Self Care | Payer: Medicare Other | Source: Ambulatory Visit | Attending: Family Medicine | Admitting: Family Medicine

## 2010-10-19 DIAGNOSIS — Z1231 Encounter for screening mammogram for malignant neoplasm of breast: Secondary | ICD-10-CM

## 2010-10-27 ENCOUNTER — Telehealth: Payer: Self-pay | Admitting: Family Medicine

## 2010-10-27 NOTE — Telephone Encounter (Signed)
Pts spouse called and said that pt is needing a script for Cyclobenzaprine hcl 10 mg to be faxed to [facility] mail order pharmacy for 90 day supply.

## 2010-10-28 MED ORDER — CYCLOBENZAPRINE HCL 10 MG PO TABS: 10.0000 mg | ORAL_TABLET | Freq: Three times a day (TID) | ORAL | Status: DC | PRN

## 2010-10-28 NOTE — Telephone Encounter (Signed)
I took care of this.

## 2011-02-25 ENCOUNTER — Other Ambulatory Visit: Payer: Self-pay | Admitting: Family Medicine

## 2011-03-02 ENCOUNTER — Telehealth: Payer: Self-pay | Admitting: Family Medicine

## 2011-03-02 MED ORDER — DIFLORASONE DIACET EMOLL BASE 0.05 % EX CREA: 1.0000 "application " | TOPICAL_CREAM | CUTANEOUS | Status: DC | PRN

## 2011-03-02 NOTE — Telephone Encounter (Signed)
Script for Apexicon was approved and sent e-scribe.

## 2011-03-03 NOTE — Telephone Encounter (Signed)
There is no medication request with this message.

## 2011-05-17 ENCOUNTER — Ambulatory Visit: Payer: Medicare Other | Admitting: Family Medicine

## 2011-05-17 ENCOUNTER — Telehealth: Payer: Self-pay | Admitting: *Deleted

## 2011-05-17 NOTE — Telephone Encounter (Signed)
Agree with above 

## 2011-05-17 NOTE — Telephone Encounter (Addendum)
Pt came in today for a flu shot and said that for the past 2 weeks she has had 3 episodes of chest pain with SOB, neck, nausea, and jaw pain.  She has also had some Acid Reflux.  Right now she is not having any SOB, chest pain, nausea, neck, or jaw pain.  Her BP is 154/102 O2 is 98% pulse 92.  She is taking Norvasc 5 mg and HCTZ 25 mg daily.  She has no hx of heart disease or MI.  She in fact states that she is concerned because she is pretty much healthy.  Pt had taken her BP this morning and she states that her BP goes up when she goes to the dr.  [place] Monday with Dr Clent Ridges and per Dr Clent Ridges since pt is no acute distress she is ok to wait and see him the morning 11/13.  Check BP tonight and if still elevated take another Norvasc pill.  If pts symptoms return she is to go straight to the ER.  Pt is also to take asa 81 mg if she has not already.  No flu shot today.  Pt was given Dr Claris Che instructions and she is going to see him tomorrow at 11 am.  She agreed to go to ER if chest pain came back today.  Pt does have a hx of hyperlipidemia so I told pt to come in fasting just in case Dr Clent Ridges wanted to draw labs.  I also explained to pt that heart attack symptoms are different in females than males.  Told pt that symptoms can occur in the jaw and neck area.

## 2011-05-18 ENCOUNTER — Ambulatory Visit (INDEPENDENT_AMBULATORY_CARE_PROVIDER_SITE_OTHER): Payer: Medicare Other | Admitting: Family Medicine

## 2011-05-18 ENCOUNTER — Encounter: Payer: Self-pay | Admitting: Family Medicine

## 2011-05-18 DIAGNOSIS — R079 Chest pain, unspecified: Secondary | ICD-10-CM

## 2011-05-18 DIAGNOSIS — K219 Gastro-esophageal reflux disease without esophagitis: Secondary | ICD-10-CM

## 2011-05-18 DIAGNOSIS — Z23 Encounter for immunization: Secondary | ICD-10-CM

## 2011-05-18 MED ORDER — OMEPRAZOLE 40 MG PO CPDR: 40.0000 mg | DELAYED_RELEASE_CAPSULE | Freq: Every day | ORAL | Status: DC

## 2011-05-18 NOTE — Progress Notes (Signed)
  Subjective:    Patient ID: Connie Lang, female    DOB: Apr 26, 1941, 70 y.o.   MRN: [id number]  HPI Here to discuss chest pains she has been having for the past 4 weeks. She has had 3 different episodes of these, the first one was the worst and lasted the longest (about 2 hours). The other 2 lasted about 45 minutes each. The last of these was 5 days ago. She describes a squeezing chest discomfort which radiates up to the neck, mild SOB, and some nausea. No sweats. These are never associated with exertion. Of note, she had a treadmill ECHO stress test on 08-22-08 due to some T wave inversions seen on her EKG. This was normal with no signs of ischemia and an EF of 55-60%. She does have frequent GERD however, with daily episodes of burning in the epigastrium and chest and throat. Sometimes she chokes on food that refluxes. She takes a lot of TUMS, and this always helps for awhile. No trouble swallowing.    Review of Systems  Constitutional: Negative.   Respiratory: Negative.   Cardiovascular: Positive for chest pain. Negative for palpitations and leg swelling.  Gastrointestinal: Negative.        Objective:   Physical Exam  Constitutional: She appears well-developed and well-nourished.  Neck: Neck supple. No thyromegaly present.  Cardiovascular: Normal rate, regular rhythm, normal heart sounds and intact distal pulses.  Exam reveals no gallop and no friction rub.   No murmur heard.      EKG shows no change from her baseline with anterior T wave changes   Pulmonary/Chest: Effort normal and breath sounds normal.  Lymphadenopathy:    She has no cervical adenopathy.          Assessment & Plan:  It seems all these symptoms are from GERD. Start on Omeprazole every morning. Recheck in 2 weeks

## 2011-05-18 NOTE — Progress Notes (Signed)
Addended by: Aniceto Boss A on: 05/18/2011 12:59 PM   Modules accepted: Orders

## 2011-06-11 ENCOUNTER — Telehealth: Payer: Self-pay | Admitting: Family Medicine

## 2011-06-11 MED ORDER — OMEPRAZOLE 40 MG PO CPDR: 40.0000 mg | DELAYED_RELEASE_CAPSULE | Freq: Every day | ORAL | Status: DC

## 2011-06-11 NOTE — Telephone Encounter (Signed)
Script sent e-scribe to [facility].

## 2011-09-08 ENCOUNTER — Telehealth: Payer: Self-pay | Admitting: Family Medicine

## 2011-09-08 NOTE — Telephone Encounter (Signed)
Dr. Clent Ridges - pt is requesting a Tier Exception on omeprazole 40mg  caps. [employer] states she has to have tried a generic or lower tier med, which I don't see that he has. The other option is to explain why a lower tier (Tier 1) med would not work for her. If NEITHER of these are an option, the Prior Auth will not go through - you'll need to change her med to a lower tier med. I think pantoprazole is Tier 1. Please advise. Thanks.

## 2011-09-09 NOTE — Telephone Encounter (Signed)
Change this to Pantoprozole 40 mg a day. Please send in for a one year supply, thanks

## 2011-09-09 NOTE — Telephone Encounter (Signed)
Connie Lang, I think he meant to route this to you. Can you send this in? Also, please let pt or husband know about med change. Thanks!

## 2011-09-10 MED ORDER — PANTOPRAZOLE SODIUM 40 MG PO TBEC: 40.0000 mg | DELAYED_RELEASE_TABLET | Freq: Every day | ORAL | Status: DC

## 2011-09-10 NOTE — Telephone Encounter (Signed)
Spoke with pt and sent a 30 day supply in to [facility].

## 2011-09-14 ENCOUNTER — Telehealth: Payer: Self-pay | Admitting: Family Medicine

## 2011-09-14 NOTE — Telephone Encounter (Signed)
duplicate

## 2011-09-14 NOTE — Telephone Encounter (Addendum)
Pt came by office and said that he and his wifes mail order pharmacy has changed from [facility] to [facility]. Pt has brought by a copy form for [facility]. Pt still uses [facility] at [address], sometimes. Pt does not needs any refills at this time. Pt and spouse are both coming in for ov on Thursday 09/16/11.

## 2011-09-16 ENCOUNTER — Other Ambulatory Visit: Payer: Self-pay | Admitting: Family Medicine

## 2011-09-16 ENCOUNTER — Encounter: Payer: Self-pay | Admitting: Family Medicine

## 2011-09-16 ENCOUNTER — Ambulatory Visit (INDEPENDENT_AMBULATORY_CARE_PROVIDER_SITE_OTHER): Payer: Medicare Other | Admitting: Family Medicine

## 2011-09-16 VITALS — BP 128/88 | HR 88 | Temp 97.9°F | Ht 63.5 in | Wt 182.0 lb

## 2011-09-16 DIAGNOSIS — E785 Hyperlipidemia, unspecified: Secondary | ICD-10-CM

## 2011-09-16 DIAGNOSIS — J309 Allergic rhinitis, unspecified: Secondary | ICD-10-CM

## 2011-09-16 DIAGNOSIS — Z9109 Other allergy status, other than to drugs and biological substances: Secondary | ICD-10-CM

## 2011-09-16 DIAGNOSIS — K219 Gastro-esophageal reflux disease without esophagitis: Secondary | ICD-10-CM

## 2011-09-16 DIAGNOSIS — Z1231 Encounter for screening mammogram for malignant neoplasm of breast: Secondary | ICD-10-CM

## 2011-09-16 DIAGNOSIS — I10 Essential (primary) hypertension: Secondary | ICD-10-CM

## 2011-09-16 LAB — CBC WITH DIFFERENTIAL/PLATELET
Basophils Absolute: 0.1 10*3/uL (ref 0.0–0.1)
Eosinophils Absolute: 0.4 10*3/uL (ref 0.0–0.7)
HCT: 42 % (ref 36.0–46.0)
Lymphs Abs: 2.4 10*3/uL (ref 0.7–4.0)
MCHC: 33.7 g/dL (ref 30.0–36.0)
MCV: 98.7 fl (ref 78.0–100.0)
Monocytes Absolute: 0.7 10*3/uL (ref 0.1–1.0)
Monocytes Relative: 9.1 % (ref 3.0–12.0)
Neutro Abs: 4.7 10*3/uL (ref 1.4–7.7)
Platelets: 206 10*3/uL (ref 150.0–400.0)
RDW: 12.8 % (ref 11.5–14.6)

## 2011-09-16 LAB — POCT URINALYSIS DIPSTICK
Blood, UA: NEGATIVE
Ketones, UA: NEGATIVE
Protein, UA: NEGATIVE
pH, UA: 7

## 2011-09-16 LAB — HEPATIC FUNCTION PANEL
ALT: 54 U/L — ABNORMAL HIGH (ref 0–35)
AST: 39 U/L — ABNORMAL HIGH (ref 0–37)
Albumin: 4.7 g/dL (ref 3.5–5.2)
Alkaline Phosphatase: 107 U/L (ref 39–117)
Total Bilirubin: 0.5 mg/dL (ref 0.3–1.2)

## 2011-09-16 LAB — BASIC METABOLIC PANEL
BUN: 17 mg/dL (ref 6–23)
Chloride: 97 mEq/L (ref 96–112)
Glucose, Bld: 117 mg/dL — ABNORMAL HIGH (ref 70–99)
Potassium: 4.1 mEq/L (ref 3.5–5.1)

## 2011-09-16 MED ORDER — SIMVASTATIN 20 MG PO TABS: 20.0000 mg | ORAL_TABLET | Freq: Every day | ORAL | Status: DC

## 2011-09-16 MED ORDER — DIFLORASONE DIACET EMOLL BASE 0.05 % EX CREA: 1.0000 "application " | TOPICAL_CREAM | Freq: Two times a day (BID) | CUTANEOUS | Status: DC

## 2011-09-16 MED ORDER — HYDROCHLOROTHIAZIDE 25 MG PO TABS: 25.0000 mg | ORAL_TABLET | Freq: Every day | ORAL | Status: DC

## 2011-09-16 MED ORDER — PANTOPRAZOLE SODIUM 40 MG PO TBEC: 40.0000 mg | DELAYED_RELEASE_TABLET | Freq: Every day | ORAL | Status: DC

## 2011-09-16 MED ORDER — AMLODIPINE BESYLATE 5 MG PO TABS: 5.0000 mg | ORAL_TABLET | Freq: Every day | ORAL | Status: DC

## 2011-09-16 NOTE — Progress Notes (Signed)
  Subjective:    Patient ID: Connie Lang, female    DOB: 01-23-1941, 71 y.o.   MRN: [id number]  HPI 71 yr old female for a cpx. She feels fine with no complaints. We saw her for chest pains a few months ago and thought these were due to GERD. In fact, after starting on an acid blocker these pains immediately stopped, and she has felt fine ever since. She and her husband have started an exercise program at the [facility], and they work out 3 days a week. Her mammogram is coming up in May.    Review of Systems  Constitutional: Negative.   HENT: Negative.   Eyes: Negative.   Respiratory: Negative.   Cardiovascular: Negative.   Gastrointestinal: Negative.   Genitourinary: Negative for dysuria, urgency, frequency, hematuria, flank pain, decreased urine volume, enuresis, difficulty urinating, pelvic pain and dyspareunia.  Musculoskeletal: Negative.   Skin: Negative.   Neurological: Negative.   Hematological: Negative.   Psychiatric/Behavioral: Negative.        Objective:   Physical Exam  Constitutional: She is oriented to person, place, and time. She appears well-developed and well-nourished. No distress.  HENT:  Head: Normocephalic and atraumatic.  Right Ear: External ear normal.  Left Ear: External ear normal.  Nose: Nose normal.  Mouth/Throat: Oropharynx is clear and moist. No oropharyngeal exudate.  Eyes: Conjunctivae and EOM are normal. Pupils are equal, round, and reactive to light. No scleral icterus.  Neck: Normal range of motion. Neck supple. No JVD present. No thyromegaly present.  Cardiovascular: Normal rate, regular rhythm, normal heart sounds and intact distal pulses.  Exam reveals no gallop and no friction rub.   No murmur heard. Pulmonary/Chest: Effort normal and breath sounds normal. No respiratory distress. She has no wheezes. She has no rales. She exhibits no tenderness.  Abdominal: Soft. Bowel sounds are normal. She exhibits no distension and no mass. There is no  tenderness. There is no rebound and no guarding.  Musculoskeletal: Normal range of motion. She exhibits no edema and no tenderness.  Lymphadenopathy:    She has no cervical adenopathy.  Neurological: She is alert and oriented to person, place, and time. She has normal reflexes. No cranial nerve deficit. She exhibits normal muscle tone. Coordination normal.  Skin: Skin is warm and dry. No rash noted. No erythema.  Psychiatric: She has a normal mood and affect. Her behavior is normal. Judgment and thought content normal.          Assessment & Plan:  Well exam. Get fasting labs today

## 2011-09-20 NOTE — Progress Notes (Signed)
Quick Note:  Left a message for pt to return call. ______ 

## 2011-09-20 NOTE — Progress Notes (Signed)
Quick Note:  Pt aware ______ 

## 2011-10-20 ENCOUNTER — Ambulatory Visit
Admission: RE | Admit: 2011-10-20 | Discharge: 2011-10-20 | Disposition: A | Discharge status: Home / Self Care | Payer: Medicare Other | Source: Ambulatory Visit | Attending: Family Medicine | Admitting: Family Medicine

## 2011-10-20 DIAGNOSIS — Z1231 Encounter for screening mammogram for malignant neoplasm of breast: Secondary | ICD-10-CM

## 2012-03-21 ENCOUNTER — Other Ambulatory Visit: Payer: Self-pay | Admitting: Family Medicine

## 2012-03-21 NOTE — Telephone Encounter (Signed)
Pt needs new rx generic flexeril 10mg  #270 [facility] [phone]. Pt switch pharm

## 2012-03-21 NOTE — Telephone Encounter (Signed)
Rx last filled on 10/28/10 #[id number]. Pt last seen 09/16/2011.  Please advise.

## 2012-03-22 MED ORDER — CYCLOBENZAPRINE HCL 10 MG PO TABS: 10.0000 mg | ORAL_TABLET | Freq: Three times a day (TID) | ORAL | Status: DC | PRN

## 2012-03-22 NOTE — Telephone Encounter (Signed)
I sent script e-scribe. 

## 2012-03-22 NOTE — Telephone Encounter (Signed)
Refill for one year 

## 2012-03-31 ENCOUNTER — Encounter: Payer: Self-pay | Admitting: Family Medicine

## 2012-03-31 ENCOUNTER — Ambulatory Visit (INDEPENDENT_AMBULATORY_CARE_PROVIDER_SITE_OTHER): Payer: Medicare Other

## 2012-03-31 DIAGNOSIS — Z23 Encounter for immunization: Secondary | ICD-10-CM

## 2012-06-06 ENCOUNTER — Ambulatory Visit (INDEPENDENT_AMBULATORY_CARE_PROVIDER_SITE_OTHER): Payer: Medicare Other | Admitting: Family Medicine

## 2012-06-06 ENCOUNTER — Encounter: Payer: Self-pay | Admitting: Family Medicine

## 2012-06-06 VITALS — BP 130/84 | HR 85 | Temp 97.6°F | Wt 168.0 lb

## 2012-06-06 DIAGNOSIS — M546 Pain in thoracic spine: Secondary | ICD-10-CM

## 2012-06-06 MED ORDER — NABUMETONE 500 MG PO TABS: 500.0000 mg | ORAL_TABLET | Freq: Two times a day (BID) | ORAL | Status: DC | PRN

## 2012-06-06 NOTE — Progress Notes (Signed)
  Subjective:    Patient ID: Connie Lang, female    DOB: 22-Apr-1941, 71 y.o.   MRN: [id number]  HPI Here for one week of sharp intermittent pain in the right middle back. No recent trauma but she usually works out at [employer] 3 days a week which includes lifting weights. She is using heat and Nabumetone and Flexeril, and she actually feels better today.    Review of Systems  Constitutional: Negative.   Musculoskeletal: Positive for back pain.       Objective:   Physical Exam  Constitutional: She appears well-developed and well-nourished. No distress.  Musculoskeletal:       Mildly tender in the right middle back, no spasms, full ROM           Assessment & Plan:  Thoracic pain, probably an irritated nerve root. Advised her to rest and to avoid any upper body exercises or weight lifting for one month. Recheck prn

## 2012-07-06 ENCOUNTER — Encounter: Payer: Self-pay | Admitting: Family Medicine

## 2012-09-20 ENCOUNTER — Encounter: Payer: Self-pay | Admitting: Family Medicine

## 2012-09-20 ENCOUNTER — Ambulatory Visit (INDEPENDENT_AMBULATORY_CARE_PROVIDER_SITE_OTHER): Payer: Medicare Other | Admitting: Family Medicine

## 2012-09-20 VITALS — BP 140/90 | HR 87 | Temp 97.8°F | Ht 64.0 in | Wt 172.0 lb

## 2012-09-20 DIAGNOSIS — M810 Age-related osteoporosis without current pathological fracture: Secondary | ICD-10-CM

## 2012-09-20 DIAGNOSIS — I10 Essential (primary) hypertension: Secondary | ICD-10-CM

## 2012-09-20 DIAGNOSIS — E785 Hyperlipidemia, unspecified: Secondary | ICD-10-CM

## 2012-09-20 DIAGNOSIS — E876 Hypokalemia: Secondary | ICD-10-CM

## 2012-09-20 DIAGNOSIS — Z0000 Encounter for general adult medical examination without abnormal findings: Secondary | ICD-10-CM

## 2012-09-20 DIAGNOSIS — Z23 Encounter for immunization: Secondary | ICD-10-CM

## 2012-09-20 LAB — POCT URINALYSIS DIPSTICK
Bilirubin, UA: NEGATIVE
Glucose, UA: NEGATIVE
Nitrite, UA: NEGATIVE
pH, UA: 7

## 2012-09-20 LAB — LIPID PANEL
Cholesterol: 200 mg/dL (ref 0–200)
HDL: 67.4 mg/dL (ref >39.00)
LDL Cholesterol: 94 mg/dL (ref 0–99)
VLDL: 38.8 mg/dL (ref 0.0–40.0)

## 2012-09-20 LAB — CBC WITH DIFFERENTIAL/PLATELET
Basophils Absolute: 0.1 10*3/uL (ref 0.0–0.1)
Basophils Relative: 0.7 % (ref 0.0–3.0)
Eosinophils Absolute: 0.3 10*3/uL (ref 0.0–0.7)
Lymphocytes Relative: 23.8 % (ref 12.0–46.0)
MCHC: 34.2 g/dL (ref 30.0–36.0)
Neutrophils Relative %: 62.7 % (ref 43.0–77.0)
RBC: 4.22 Mil/uL (ref 3.87–5.11)

## 2012-09-20 LAB — BASIC METABOLIC PANEL
BUN: 12 mg/dL (ref 6–23)
Calcium: 9.5 mg/dL (ref 8.4–10.5)
Creatinine, Ser: 0.7 mg/dL (ref 0.4–1.2)
GFR: 88.68 mL/min (ref >60.00)
Glucose, Bld: 119 mg/dL — ABNORMAL HIGH (ref 70–99)

## 2012-09-20 LAB — TSH: TSH: 0.69 u[IU]/mL (ref 0.35–5.50)

## 2012-09-20 LAB — HEPATIC FUNCTION PANEL: Albumin: 4.5 g/dL (ref 3.5–5.2)

## 2012-09-20 MED ORDER — FLUTICASONE PROPIONATE 50 MCG/ACT NA SUSP: 2.0000 | Freq: Every day | NASAL | Status: DC

## 2012-09-20 MED ORDER — HYDROCHLOROTHIAZIDE 25 MG PO TABS: 25.0000 mg | ORAL_TABLET | Freq: Every day | ORAL | Status: DC

## 2012-09-20 MED ORDER — AMLODIPINE BESYLATE 5 MG PO TABS: 5.0000 mg | ORAL_TABLET | Freq: Every day | ORAL | Status: DC

## 2012-09-20 MED ORDER — MONTELUKAST SODIUM 10 MG PO TABS: 10.0000 mg | ORAL_TABLET | Freq: Every day | ORAL | Status: DC

## 2012-09-20 MED ORDER — SIMVASTATIN 20 MG PO TABS: 20.0000 mg | ORAL_TABLET | Freq: Every day | ORAL | Status: DC

## 2012-09-20 MED ORDER — CYCLOBENZAPRINE HCL 10 MG PO TABS: 10.0000 mg | ORAL_TABLET | Freq: Three times a day (TID) | ORAL | Status: DC | PRN

## 2012-09-20 NOTE — Progress Notes (Signed)
  Subjective:    Patient ID: Connie Lang, female    DOB: [birth year], 72 y.o.   MRN: [id number]  HPI 72 yr old female here for a cpx. She feels well and has no concerns. She and her husband are getting back into the gym to exercise.    Review of Systems  Constitutional: Negative.   HENT: Negative.   Eyes: Negative.   Respiratory: Negative.   Cardiovascular: Negative.   Gastrointestinal: Negative.   Genitourinary: Negative for dysuria, urgency, frequency, hematuria, flank pain, decreased urine volume, enuresis, difficulty urinating, pelvic pain and dyspareunia.  Musculoskeletal: Negative.   Skin: Negative.   Neurological: Negative.   Psychiatric/Behavioral: Negative.        Objective:   Physical Exam  Constitutional: She is oriented to person, place, and time. She appears well-developed and well-nourished. No distress.  HENT:  Head: Normocephalic and atraumatic.  Right Ear: External ear normal.  Left Ear: External ear normal.  Nose: Nose normal.  Mouth/Throat: Oropharynx is clear and moist. No oropharyngeal exudate.  Eyes: Conjunctivae and EOM are normal. Pupils are equal, round, and reactive to light. No scleral icterus.  Neck: Normal range of motion. Neck supple. No JVD present. No thyromegaly present.  Cardiovascular: Normal rate, regular rhythm, normal heart sounds and intact distal pulses.  Exam reveals no gallop and no friction rub.   No murmur heard. EKG normal   Pulmonary/Chest: Effort normal and breath sounds normal. No respiratory distress. She has no wheezes. She has no rales. She exhibits no tenderness.  Abdominal: Soft. Bowel sounds are normal. She exhibits no distension and no mass. There is no tenderness. There is no rebound and no guarding.  Musculoskeletal: Normal range of motion. She exhibits no edema and no tenderness.  Lymphadenopathy:    She has no cervical adenopathy.  Neurological: She is alert and oriented to person, place, and time. She has  normal reflexes. No cranial nerve deficit. She exhibits normal muscle tone. Coordination normal.  Skin: Skin is warm and dry. No rash noted. No erythema.  Psychiatric: She has a normal mood and affect. Her behavior is normal. Judgment and thought content normal.          Assessment & Plan:  Well exam. Get fasting labs

## 2012-09-20 NOTE — Addendum Note (Signed)
Addended by: Aniceto Boss A on: 09/20/2012 12:35 PM   Modules accepted: Orders

## 2012-09-22 ENCOUNTER — Encounter: Payer: Self-pay | Admitting: Family Medicine

## 2012-09-22 ENCOUNTER — Other Ambulatory Visit: Payer: Self-pay | Admitting: Family Medicine

## 2012-09-22 DIAGNOSIS — E876 Hypokalemia: Secondary | ICD-10-CM

## 2012-09-22 MED ORDER — POTASSIUM CHLORIDE CRYS ER 20 MEQ PO TBCR: 20.0000 meq | EXTENDED_RELEASE_TABLET | Freq: Every day | ORAL | Status: DC

## 2012-09-22 NOTE — Addendum Note (Signed)
Addended by: Aniceto Boss A on: 09/22/2012 02:09 PM   Modules accepted: Orders

## 2012-09-22 NOTE — Addendum Note (Signed)
Addended by: Aniceto Boss A on: 09/22/2012 01:57 PM   Modules accepted: Orders

## 2012-09-25 ENCOUNTER — Ambulatory Visit (INDEPENDENT_AMBULATORY_CARE_PROVIDER_SITE_OTHER)
Admission: RE | Admit: 2012-09-25 | Discharge: 2012-09-25 | Disposition: A | Discharge status: Home / Self Care | Payer: Medicare Other | Source: Ambulatory Visit | Attending: Family Medicine | Admitting: Family Medicine

## 2012-09-25 DIAGNOSIS — M810 Age-related osteoporosis without current pathological fracture: Secondary | ICD-10-CM

## 2012-09-26 MED ORDER — PANTOPRAZOLE SODIUM 40 MG PO TBEC: 40.0000 mg | DELAYED_RELEASE_TABLET | Freq: Every day | ORAL | Status: DC

## 2012-09-26 MED ORDER — DIFLORASONE DIACET EMOLL BASE 0.05 % EX CREA: 1.0000 "application " | TOPICAL_CREAM | Freq: Two times a day (BID) | CUTANEOUS | Status: DC

## 2012-10-03 ENCOUNTER — Other Ambulatory Visit: Payer: Self-pay

## 2012-10-03 DIAGNOSIS — Z1231 Encounter for screening mammogram for malignant neoplasm of breast: Secondary | ICD-10-CM

## 2012-10-03 NOTE — Progress Notes (Signed)
Quick Note:  I spoke with pt and released results in my chart ______ 

## 2012-10-25 ENCOUNTER — Other Ambulatory Visit (INDEPENDENT_AMBULATORY_CARE_PROVIDER_SITE_OTHER): Payer: Medicare Other

## 2012-10-25 DIAGNOSIS — E876 Hypokalemia: Secondary | ICD-10-CM

## 2012-10-25 LAB — BASIC METABOLIC PANEL
BUN: 12 mg/dL (ref 6–23)
Calcium: 9.3 mg/dL (ref 8.4–10.5)
GFR: 90.16 mL/min (ref >60.00)
Potassium: 3.8 mEq/L (ref 3.5–5.1)

## 2012-10-27 ENCOUNTER — Encounter: Payer: Self-pay | Admitting: Family Medicine

## 2012-10-30 ENCOUNTER — Ambulatory Visit
Admission: RE | Admit: 2012-10-30 | Discharge: 2012-10-30 | Disposition: A | Discharge status: Home / Self Care | Payer: Medicare Other | Source: Ambulatory Visit

## 2012-10-30 DIAGNOSIS — Z1231 Encounter for screening mammogram for malignant neoplasm of breast: Secondary | ICD-10-CM

## 2013-02-12 ENCOUNTER — Other Ambulatory Visit: Payer: Self-pay | Admitting: Family Medicine

## 2013-02-12 MED ORDER — NABUMETONE 500 MG PO TABS: 500.0000 mg | ORAL_TABLET | Freq: Two times a day (BID) | ORAL | Status: DC | PRN

## 2013-02-13 ENCOUNTER — Other Ambulatory Visit (INDEPENDENT_AMBULATORY_CARE_PROVIDER_SITE_OTHER): Payer: Self-pay

## 2013-04-24 ENCOUNTER — Ambulatory Visit (INDEPENDENT_AMBULATORY_CARE_PROVIDER_SITE_OTHER): Payer: Medicare Other

## 2013-04-24 DIAGNOSIS — Z23 Encounter for immunization: Secondary | ICD-10-CM

## 2013-06-20 ENCOUNTER — Other Ambulatory Visit: Payer: Self-pay | Admitting: Family Medicine

## 2013-09-21 ENCOUNTER — Encounter: Payer: Self-pay | Admitting: Family Medicine

## 2013-09-21 ENCOUNTER — Ambulatory Visit (INDEPENDENT_AMBULATORY_CARE_PROVIDER_SITE_OTHER): Payer: Medicare Other | Admitting: Family Medicine

## 2013-09-21 VITALS — BP 148/90 | Temp 97.9°F | Ht 64.0 in | Wt 181.0 lb

## 2013-09-21 DIAGNOSIS — I4510 Unspecified right bundle-branch block: Secondary | ICD-10-CM

## 2013-09-21 DIAGNOSIS — M79676 Pain in unspecified toe(s): Secondary | ICD-10-CM

## 2013-09-21 DIAGNOSIS — Z0000 Encounter for general adult medical examination without abnormal findings: Secondary | ICD-10-CM

## 2013-09-21 DIAGNOSIS — M79609 Pain in unspecified limb: Secondary | ICD-10-CM | POA: Diagnosis not present

## 2013-09-21 DIAGNOSIS — E785 Hyperlipidemia, unspecified: Secondary | ICD-10-CM

## 2013-09-21 DIAGNOSIS — I10 Essential (primary) hypertension: Secondary | ICD-10-CM | POA: Diagnosis not present

## 2013-09-21 LAB — LIPID PANEL
CHOLESTEROL: 176 mg/dL (ref 0–200)
HDL: 60.1 mg/dL (ref >39.00)
LDL CALC: 75 mg/dL (ref 0–99)
TRIGLYCERIDES: 204 mg/dL — AB (ref 0.0–149.0)
Total CHOL/HDL Ratio: 3
VLDL: 40.8 mg/dL — AB (ref 0.0–40.0)

## 2013-09-21 LAB — BASIC METABOLIC PANEL
BUN: 13 mg/dL (ref 6–23)
CHLORIDE: 94 meq/L — AB (ref 96–112)
CO2: 30 mEq/L (ref 19–32)
CREATININE: 0.7 mg/dL (ref 0.4–1.2)
Calcium: 9.6 mg/dL (ref 8.4–10.5)
GFR: 82.86 mL/min (ref >60.00)
GLUCOSE: 117 mg/dL — AB (ref 70–99)
Potassium: 3.9 mEq/L (ref 3.5–5.1)
Sodium: 133 mEq/L — ABNORMAL LOW (ref 135–145)

## 2013-09-21 LAB — POCT URINALYSIS DIPSTICK
Bilirubin, UA: NEGATIVE
Blood, UA: NEGATIVE
Glucose, UA: NEGATIVE
KETONES UA: NEGATIVE
NITRITE UA: NEGATIVE
PH UA: 7.5
PROTEIN UA: NEGATIVE
Spec Grav, UA: 1.015
Urobilinogen, UA: 0.2

## 2013-09-21 LAB — CBC WITH DIFFERENTIAL/PLATELET
Basophils Absolute: 0 10*3/uL (ref 0.0–0.1)
Basophils Relative: 0.7 % (ref 0.0–3.0)
Eosinophils Absolute: 0.3 10*3/uL (ref 0.0–0.7)
Eosinophils Relative: 4.1 % (ref 0.0–5.0)
HCT: 40.3 % (ref 36.0–46.0)
HEMOGLOBIN: 13.5 g/dL (ref 12.0–15.0)
LYMPHS ABS: 1.9 10*3/uL (ref 0.7–4.0)
LYMPHS PCT: 30.7 % (ref 12.0–46.0)
MCHC: 33.4 g/dL (ref 30.0–36.0)
MCV: 96.7 fl (ref 78.0–100.0)
MONO ABS: 0.7 10*3/uL (ref 0.1–1.0)
MONOS PCT: 11.1 % (ref 3.0–12.0)
NEUTROS PCT: 53.4 % (ref 43.0–77.0)
Neutro Abs: 3.4 10*3/uL (ref 1.4–7.7)
PLATELETS: 217 10*3/uL (ref 150.0–400.0)
RBC: 4.16 Mil/uL (ref 3.87–5.11)
RDW: 12.7 % (ref 11.5–14.6)
WBC: 6.3 10*3/uL (ref 4.5–10.5)

## 2013-09-21 LAB — HEPATIC FUNCTION PANEL
ALBUMIN: 4.5 g/dL (ref 3.5–5.2)
ALT: 42 U/L — AB (ref 0–35)
AST: 32 U/L (ref 0–37)
Alkaline Phosphatase: 79 U/L (ref 39–117)
BILIRUBIN TOTAL: 0.8 mg/dL (ref 0.3–1.2)
Bilirubin, Direct: 0 mg/dL (ref 0.0–0.3)
Total Protein: 7.3 g/dL (ref 6.0–8.3)

## 2013-09-21 LAB — TSH: TSH: 1.1 u[IU]/mL (ref 0.35–5.50)

## 2013-09-21 MED ORDER — SIMVASTATIN 20 MG PO TABS: 20.0000 mg | ORAL_TABLET | Freq: Every day | ORAL | Status: DC

## 2013-09-21 MED ORDER — AMLODIPINE BESYLATE 5 MG PO TABS: 5.0000 mg | ORAL_TABLET | Freq: Every day | ORAL | Status: DC

## 2013-09-21 MED ORDER — POTASSIUM CHLORIDE CRYS ER 20 MEQ PO TBCR: 20.0000 meq | EXTENDED_RELEASE_TABLET | Freq: Every day | ORAL | Status: DC

## 2013-09-21 MED ORDER — HYDROCHLOROTHIAZIDE 25 MG PO TABS: 25.0000 mg | ORAL_TABLET | Freq: Every day | ORAL | Status: DC

## 2013-09-21 MED ORDER — PANTOPRAZOLE SODIUM 40 MG PO TBEC: 40.0000 mg | DELAYED_RELEASE_TABLET | Freq: Every day | ORAL | Status: DC

## 2013-09-21 MED ORDER — CYCLOBENZAPRINE HCL 10 MG PO TABS: 10.0000 mg | ORAL_TABLET | Freq: Three times a day (TID) | ORAL | Status: DC | PRN

## 2013-09-21 MED ORDER — NABUMETONE 500 MG PO TABS: ORAL_TABLET | ORAL | Status: DC

## 2013-09-21 NOTE — Progress Notes (Signed)
Pre visit review using our clinic review tool, if applicable. No additional management support is needed unless otherwise documented below in the visit note. 

## 2013-09-21 NOTE — Progress Notes (Signed)
   Subjective:    Patient ID: Connie Lang, female    DOB: 1941/02/28, 73 y.o.   MRN: [id number]  HPI 73 yr old female for a cpx. She has only one concern and that is swelling and pain in the right great toe. She thinks she fractured this toe years ago and now it really bothers her.    Review of Systems  Constitutional: Negative.   HENT: Negative.   Eyes: Negative.   Respiratory: Negative.   Cardiovascular: Negative.   Gastrointestinal: Negative.   Genitourinary: Negative for dysuria, urgency, frequency, hematuria, flank pain, decreased urine volume, enuresis, difficulty urinating, pelvic pain and dyspareunia.  Musculoskeletal: Negative.   Skin: Negative.   Neurological: Negative.   Psychiatric/Behavioral: Negative.        Objective:   Physical Exam  Constitutional: She is oriented to person, place, and time. She appears well-developed and well-nourished. No distress.  HENT:  Head: Normocephalic and atraumatic.  Right Ear: External ear normal.  Left Ear: External ear normal.  Nose: Nose normal.  Mouth/Throat: Oropharynx is clear and moist. No oropharyngeal exudate.  Eyes: Conjunctivae and EOM are normal. Pupils are equal, round, and reactive to light. No scleral icterus.  Neck: Normal range of motion. Neck supple. No JVD present. No thyromegaly present.  Cardiovascular: Normal rate, regular rhythm, normal heart sounds and intact distal pulses.  Exam reveals no gallop and no friction rub.   No murmur heard. EKG shows old first degree AVB and new RBBB  Pulmonary/Chest: Effort normal and breath sounds normal. No respiratory distress. She has no wheezes. She has no rales. She exhibits no tenderness.  Abdominal: Soft. Bowel sounds are normal. She exhibits no distension and no mass. There is no tenderness. There is no rebound and no guarding.  Musculoskeletal: Normal range of motion. She exhibits no edema and no tenderness.  The MTP of the right great toe is swollen and tender    Lymphadenopathy:    She has no cervical adenopathy.  Neurological: She is alert and oriented to person, place, and time. She has normal reflexes. No cranial nerve deficit. She exhibits normal muscle tone. Coordination normal.  Skin: Skin is warm and dry. No rash noted. No erythema.  Psychiatric: She has a normal mood and affect. Her behavior is normal. Judgment and thought content normal.          Assessment & Plan:  Well exam. Get fasting labs. Refer to Podiatry for the toe. Refer to Cardiology for new RBBB.

## 2013-09-24 ENCOUNTER — Telehealth: Payer: Self-pay | Admitting: Family Medicine

## 2013-09-24 ENCOUNTER — Other Ambulatory Visit: Payer: Self-pay

## 2013-09-24 DIAGNOSIS — Z1231 Encounter for screening mammogram for malignant neoplasm of breast: Secondary | ICD-10-CM

## 2013-09-24 NOTE — Telephone Encounter (Signed)
Relevant patient education assigned to patient using Emmi. ° °

## 2013-10-11 ENCOUNTER — Encounter: Payer: Self-pay | Admitting: Podiatry

## 2013-10-11 ENCOUNTER — Ambulatory Visit (INDEPENDENT_AMBULATORY_CARE_PROVIDER_SITE_OTHER): Payer: Medicare Other

## 2013-10-11 ENCOUNTER — Ambulatory Visit (INDEPENDENT_AMBULATORY_CARE_PROVIDER_SITE_OTHER): Payer: Medicare Other | Admitting: Podiatry

## 2013-10-11 VITALS — BP 148/85 | HR 82 | Resp 16 | Ht 64.0 in | Wt 170.0 lb

## 2013-10-11 DIAGNOSIS — M2020 Hallux rigidus, unspecified foot: Secondary | ICD-10-CM

## 2013-10-11 NOTE — Patient Instructions (Signed)
Hallux Rigidus Hallux rigidus is a condition involving pain and a loss of motion of the first (big) toe. The pain gets worse with lifting up (extension) of the toe. This is usually due to arthritic bony bumps (spurring) of the joint at the base of the big toe.  SYMPTOMS   Pain, with lifting up of the toe.  Tenderness over the joint where the big toe meets the foot.  Redness, swelling, and warmth over the top of the base of the big toe (sometimes).  Foot pain, stiffness, and limping. CAUSES  Halllux rigidus is caused by arthritis of the joint where the big toe meets the foot. The arthritis creates a bone spur that pinches the soft tissues, when the toe is extended. RISK INCREASES WITH:  Tight shoes, with a narrow toe box.  Family history of foot problems.  Gout and rheumatoid and psoriatic arthritis.  History of previous toe injury, including "turf toe."  Long first toe, flat feet, and other big toe bony bumps.  Arthritis of the big toe. PREVENTION   Wear wide toed shoes that fit well.  Tape the big toe, to reduce motion and to prevent pinching of the tissues between the bone.  Maintain physical fitness:  Foot and ankle flexibility.  Muscle strength and endurance. PROGNOSIS  This condition can usually be managed with proper treatment. However, surgery is typically required to prevent the problem from recurring.  RELATED COMPLICATIONS  Injury to other areas of the foot or ankle, caused by abnormal walking in an attempt to avoid the pain felt when walking normally. TREATMENT Treatment first involves stopping the activities that aggravate your symptoms. Ice and medicine can be used to reduce the pain and inflammation. Modifications to shoes may help reduce pain, including wearing stiff-soled shoes, shoes with a wide toe box, inserting a padded donut to relieve pressure on top of the joint, or wearing an arch support. Corticosteroid injections may be given to reduce inflammation.  If non-surgical treatment is unsuccessful, surgery may be needed. Surgical options include removing the arthritic bony spur, cutting a bone in the foot to change the arc of motion (allowing the toe to extend more), or fusion of the joint (eliminating all motion in the joint at the base of the big toe).  MEDICATION   If pain medicine is needed, nonsteroidal anti-inflammatory medicines (aspirin and ibuprofen), or other minor pain relievers (acetaminophen), are often advised.  Do not take pain medicine for 7 days before surgery.  Prescription pain relievers are usually prescribed only after surgery. Use only as directed and only as much as you need.  Ointments for arthritis, applied to the skin, may give some relief.  Injections of corticosteroids may be given to reduce inflammation. HEAT AND COLD  Cold treatment (icing) relieves pain and reduces inflammation. Cold treatment should be applied for 10 to 15 minutes every 2 to 3 hours, and immediately after activity that aggravates your symptoms. Use ice packs or an ice massage.  Heat treatment may be used before performing the stretching and strengthening activities prescribed by your caregiver, physical therapist, or athletic trainer. Use a heat pack or a warm water soak. SEEK MEDICAL CARE IF:   Symptoms get worse or do not improve in 2 weeks, despite treatment.  After surgery you develop fever, increasing pain, redness, swelling, drainage of fluids, bleeding, or increasing warmth.  New, unexplained symptoms develop. (Drugs used in treatment may produce side effects.) Document Released: 06/21/2005 Document Revised: 09/13/2011 Document Reviewed: 10/03/2008 [employer] Patient  Information 2014 [employer].  

## 2013-10-11 NOTE — Progress Notes (Signed)
Subjective:     Patient ID: Connie Lang, female   DOB: 07-29-1940, 73 y.o.   MRN: [id number]  Foot Pain   patient presents with a large long first metatarsal head right that she states is been there for years and has become worse recently. It is sore with pressure and she gets some pain within the joint but not like when she gets on top of the foot   Review of Systems  All other systems reviewed and are negative.      Objective:   Physical Exam  Nursing note and vitals reviewed. Cardiovascular: Intact distal pulses.   Musculoskeletal: Normal range of motion.  Neurological: She is alert.  Skin: Skin is warm.   neurovascular status intact with muscle strength adequate and large hyperostosis medial aspect first metatarsal and dorsal aspect first metatarsal head right with reduction of the range of motion and discomfort when I pressed around the bone structure itself. Fill time was normal and arch height within normal limits    Assessment:     HAV deformity with hallux limitus deformity right and large spur which becomes irritated was shoe gear with possible joint damage present    Plan:     H&P and x-rays reviewed. Patient does have adequate range of motion of the first MPJ with no crepitus I am hopeful that this will turn out to be spurs and lack of motion which is causing her pain. At this point I have recommended removal of bone spurs with modified McBride bunionectomy and that someday the possibility exists for implant procedure. Patient does not want implant at this time and wants to try removing bone spurs understanding there is no guarantee that this will solve her problem and at this time we allowed her to review a consent form: Over line byline the surgery itself and all risk. Patient is scheduled for outpatient surgery in is encouraged to call us with any questions
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
## 2013-10-15 ENCOUNTER — Encounter: Payer: Self-pay | Admitting: Family Medicine

## 2013-10-17 NOTE — Telephone Encounter (Signed)
Tell her to NOT stop the potassium . Keep taking it

## 2013-10-23 ENCOUNTER — Encounter: Payer: Self-pay | Admitting: Podiatry

## 2013-10-23 DIAGNOSIS — M2010 Hallux valgus (acquired), unspecified foot: Secondary | ICD-10-CM

## 2013-10-29 ENCOUNTER — Encounter: Payer: Self-pay | Admitting: Podiatry

## 2013-10-29 ENCOUNTER — Ambulatory Visit (INDEPENDENT_AMBULATORY_CARE_PROVIDER_SITE_OTHER): Payer: Medicare Other

## 2013-10-29 ENCOUNTER — Ambulatory Visit (INDEPENDENT_AMBULATORY_CARE_PROVIDER_SITE_OTHER): Payer: Medicare Other | Admitting: Podiatry

## 2013-10-29 VITALS — BP 142/87 | HR 96 | Resp 16

## 2013-10-29 DIAGNOSIS — M205X9 Other deformities of toe(s) (acquired), unspecified foot: Secondary | ICD-10-CM

## 2013-10-29 DIAGNOSIS — M2010 Hallux valgus (acquired), unspecified foot: Secondary | ICD-10-CM

## 2013-10-31 ENCOUNTER — Ambulatory Visit: Payer: Medicare Other | Admitting: Cardiology

## 2013-10-31 NOTE — Progress Notes (Signed)
Subjective:     Patient ID: Connie Lang, female   DOB: [birth year], 73 y.o.   MRN: [id number]  HPI patient presents stating I'm doing great with my right foot one week after surgery   Review of Systems     Objective:   Physical Exam Neurovascular status intact with well-healed surgical site first MPJ right with wound edges well coapted and no structural bone deformity noted. Negative Homans sign noted    Assessment:     Healing well from foot surgery first metatarsal right    Plan:     Reviewed x-rays and reapplied sterile dressing and instructed on soaks. Reappoint her recheck earlier if any issues should occur

## 2013-11-01 ENCOUNTER — Encounter: Payer: Self-pay | Admitting: Cardiology

## 2013-11-01 ENCOUNTER — Ambulatory Visit
Admission: RE | Admit: 2013-11-01 | Discharge: 2013-11-01 | Disposition: A | Discharge status: Home / Self Care | Payer: Medicare Other | Source: Ambulatory Visit

## 2013-11-01 ENCOUNTER — Ambulatory Visit (INDEPENDENT_AMBULATORY_CARE_PROVIDER_SITE_OTHER): Payer: Medicare Other | Admitting: Cardiology

## 2013-11-01 VITALS — BP 156/87 | HR 100 | Ht 64.0 in | Wt 181.0 lb

## 2013-11-01 DIAGNOSIS — Z1231 Encounter for screening mammogram for malignant neoplasm of breast: Secondary | ICD-10-CM

## 2013-11-01 DIAGNOSIS — I10 Essential (primary) hypertension: Secondary | ICD-10-CM

## 2013-11-01 DIAGNOSIS — I4510 Unspecified right bundle-branch block: Secondary | ICD-10-CM

## 2013-11-01 DIAGNOSIS — E7800 Pure hypercholesterolemia, unspecified: Secondary | ICD-10-CM

## 2013-11-01 DIAGNOSIS — E669 Obesity, unspecified: Secondary | ICD-10-CM | POA: Insufficient documentation

## 2013-11-01 NOTE — Progress Notes (Signed)
[facility]. [address] [place], [place]  [address] Phone: [phone] Fax:  [phone]  Date:  11/01/2013   ID:  Connie Lang, DOB Nov 26, 1940, MRN [id number]  PCP:  Laurey Morale, MD   History of Present Illness: Connie Lang is a 73 y.o. female here for evaluation of right bundle branch block. EKG reviewed.  She has not had any significant symptoms. She is wearing a boot on her right foot because of arthritis changes. She's had this for years.  Isolated episode of chest pain in 2/15. SOB at top of stairs. Felt a pulling on legs. It passed no longer. This was an isolated episode. She's not had any recurrent chest discomfort.  Wt Readings from Last 3 Encounters:  11/01/13 181 lb (82.101 kg)  10/11/13 170 lb (77.111 kg)  09/21/13 181 lb (82.101 kg)     Past Medical History  Diagnosis Date  . Transfusion history     blood   . Hyperlipemia   . Hypertension   . Osteoarthritis   . Glaucoma     glaucoma and cataracts sees Dr Satira Sark   . 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 Chicken pox   . DJD (degenerative joint disease)   . GERD (gastroesophageal reflux disease)     Past Surgical History  Procedure Laterality Date  . Breast biopsy       x 4  . Appendectomy    . Abdominal hysterectomy      TAH BSO 1985  . Cesarean section    . Tendon repair      rt wrist  . Basal cell carcinoma excision      nose and rt thigh  . Refractive surgery      01-29-2009 04-07-2009 Dr Foye Clock in [place]   . Cervical laminectomy      1983  . Myelogram    . Cervical microdiskectomy      1990 Dr Jovita Gamma  . Lumbar laminectomy      1990 Dr Jovita Gamma  . Colonoscopy  07-06-07    clear, repeat in 10 yrs   . Foot arthrodesis, modified mcbride      Current Outpatient Prescriptions  Medication Sig Dispense Refill  . amLODipine (NORVASC) 5 MG tablet Take 1 tablet (5 mg  total) by mouth daily.  90 tablet  3  . aspirin 81 MG tablet Take 81 mg by mouth daily.        . chlorpheniramine (CHLOR-TRIMETON) 4 MG tablet Take 4 mg by mouth 2 (two) times daily as needed.        . cyclobenzaprine (FLEXERIL) 10 MG tablet Take 1 tablet (10 mg total) by mouth 3 (three) times daily as needed for muscle spasms.  270 tablet  3  . diflorasone-emollient (APEXICON E) 0.05 % CREA Apply 1 application topically 2 (two) times daily.  60 Tube  3  . hydrochlorothiazide (HYDRODIURIL) 25 MG tablet Take 1 tablet (25 mg total) by mouth daily.  90 tablet  3  . nabumetone (RELAFEN) 500 MG tablet TAKE 1 TABLET BY MOUTH TWICE DAILY AS NEEDED FOR PAIN  180 tablet  3  . NON FORMULARY AREDS 2 1 TAB PO QD      . Omega-3 Fatty Acids (OMEGA 3 PO) Take by mouth.      . pantoprazole (PROTONIX) 40 MG tablet Take 1  tablet (40 mg total) by mouth daily.  90 tablet  3  . potassium chloride SA (KLOR-CON M20) 20 MEQ tablet Take 1 tablet (20 mEq total) by mouth daily.  90 tablet  3  . Propylene Glycol (SYSTANE BALANCE OP) Apply to eye as needed.        . simvastatin (ZOCOR) 20 MG tablet Take 1 tablet (20 mg total) by mouth at bedtime.  90 tablet  3  . timolol (BETIMOL) 0.5 % ophthalmic solution Place 1 drop into both eyes daily.        . travoprost, benzalkonium, (TRAVATAN) 0.004 % ophthalmic solution Place 1 drop into both eyes at bedtime.        . [DISCONTINUED] Calcium Carbonate-Vit D-Min 1200-1000 MG-UNIT CHEW Chew 1 tablet by mouth daily.         No current facility-administered medications for this visit.    Allergies:   No Known Allergies  Social History:  The patient  reports that she quit smoking about 18 years ago. She has never used smokeless tobacco. She reports that she drinks about 3.5 ounces of alcohol per week. She reports that she does not use illicit drugs. she is a retired [employer] scientist. I take her husband. [name].  Family History  Problem Relation Age of Onset  . Cancer Sister     ovarian  .  Alzheimer's disease Father   . Alzheimer's disease Paternal 75   Father with CAD CABG.   ROS:  Please see the history of present illness.   Denies any recurrent chest pain, fevers, chills, orthopnea, PND, syncope, rashes   All other systems reviewed and negative.   PHYSICAL EXAM: VS:  BP 156/87  Pulse 100  Ht 5\' 4"  (1.626 m)  Wt 181 lb (82.101 kg)  BMI 31.05 kg/m2 Well nourished, well developed, in no acute distress HEENT: normal, K[place]pp/AT, EOMI Neck: no JVD, normal carotid upstroke, no bruit Cardiac:  normal S1, S2; RRR; no murmur Lungs:  clear to auscultation bilaterally, no wheezing, rhonchi or rales Abd: soft, nontender, no hepatomegaly, no bruits Ext: no edema, 2+ distal pulses Skin: warm and dry GU: deferred Neuro: no focal abnormalities noted, AAO x 3  EKG:  09/21/13 -Sinus rhythm, 76, mild first degree AV block, right bundle branch block   ASSESSMENT AND PLAN:  1. New onset right bundle branch block-she has had EKGs in the past several years and this is new for her. Right bundle branch block is not usually associated with advancement of coronary artery disease as can be the case with left bundle branch block. Sometimes it can be indicative of structural change with a heart and therefore I will check an echocardiogram to ensure proper structure and function. She has not had any symptoms of syncope, bradycardia. Transient episode of shortness of breath noted. If this were to continue or get worse, I would encourage further cardiac testing such as stress test. For now, we will continue just echocardiogram. Described for them at length the physiology behind right bundle branch block. 2. Hypertension-continue with current medications. Mildly elevated today. Continue to monitor. 3. Obesity-encourage weight loss. 4. Hyperlipidemia-excellent use of simvastatin. 5. She may followup with me as needed.  Signed, Candee Furbish, MD [facility]  11/01/2013 3:47 PM

## 2013-11-01 NOTE — Patient Instructions (Signed)
Your physician recommends that you continue on your current medications as directed. Please refer to the Current Medication list given to you today.  Your physician has requested that you have an echocardiogram. Echocardiography is a painless test that uses sound waves to create images of your heart. It provides your doctor with information about the size and shape of your heart and how well your heart's chambers and valves are working. This procedure takes approximately one hour. There are no restrictions for this procedure.  Your physician recommends that you schedule a follow-up as needed.

## 2013-11-06 ENCOUNTER — Telehealth: Payer: Self-pay | Admitting: Family Medicine

## 2013-11-06 MED ORDER — CYCLOBENZAPRINE HCL 10 MG PO TABS: 10.0000 mg | ORAL_TABLET | Freq: Three times a day (TID) | ORAL | Status: DC | PRN

## 2013-11-06 NOTE — Telephone Encounter (Signed)
I sent script e-scribe. 

## 2013-11-06 NOTE — Telephone Encounter (Signed)
[id number] [place], [place] - [address]. AT [address] is requesting re-fill on  cyclobenzaprine (FLEXERIL) 10 MG tablet

## 2013-11-19 ENCOUNTER — Ambulatory Visit (HOSPITAL_COMMUNITY): Payer: Medicare Other | Attending: Cardiology | Admitting: Radiology

## 2013-11-19 DIAGNOSIS — R079 Chest pain, unspecified: Secondary | ICD-10-CM

## 2013-11-19 DIAGNOSIS — R9431 Abnormal electrocardiogram [ECG] [EKG]: Secondary | ICD-10-CM

## 2013-11-19 DIAGNOSIS — I4510 Unspecified right bundle-branch block: Secondary | ICD-10-CM | POA: Insufficient documentation

## 2013-11-19 NOTE — Progress Notes (Signed)
Echocardiogram performed.  

## 2013-12-18 NOTE — Progress Notes (Signed)
Dr. Paulla Dolly performed Right Keller/McBride Bunionectomy.  Vicodin 10/325 #30 one or two tablets every 4 to 6 hours prn pain.

## 2014-04-22 ENCOUNTER — Encounter: Payer: Self-pay | Admitting: Family Medicine

## 2014-04-22 ENCOUNTER — Ambulatory Visit (INDEPENDENT_AMBULATORY_CARE_PROVIDER_SITE_OTHER): Payer: Medicare Other | Admitting: Family Medicine

## 2014-04-22 VITALS — BP 148/93 | HR 88 | Temp 98.4°F | Ht 64.0 in | Wt 187.0 lb

## 2014-04-22 DIAGNOSIS — S40022A Contusion of left upper arm, initial encounter: Secondary | ICD-10-CM

## 2014-04-22 DIAGNOSIS — Z23 Encounter for immunization: Secondary | ICD-10-CM

## 2014-04-22 NOTE — Progress Notes (Signed)
   Subjective:    Patient ID: Connie Lang, female    DOB: 23-May-1941, 73 y.o.   MRN: [id number]  HPI Here for some bruising on the left forearm over the weekend with no recent trauma that she knows of. She takes aspirin daily. There is no discomfort in the arm at all.    Review of Systems  Constitutional: Negative.   Respiratory: Negative.   Cardiovascular: Negative.   Skin: Positive for color change.       Objective:   Physical Exam  Constitutional: She appears well-developed and well-nourished.  Cardiovascular: Normal rate, regular rhythm, normal heart sounds and intact distal pulses.   Pulmonary/Chest: Effort normal and breath sounds normal.  Skin:  The left forearm has a large ecchymosis which is not tender at all. There is a small lipoma on the periphery of this area but I do not think this is related at all           Assessment & Plan:  She has a bruise of unknown etiology. She will return if this happens anywhere else

## 2014-04-22 NOTE — Progress Notes (Signed)
Pre visit review using our clinic review tool, if applicable. No additional management support is needed unless otherwise documented below in the visit note. 

## 2014-06-20 ENCOUNTER — Telehealth: Payer: Self-pay

## 2014-06-20 MED ORDER — DIFLORASONE DIACET EMOLL BASE 0.05 % EX CREA: 1.0000 "application " | TOPICAL_CREAM | Freq: Two times a day (BID) | CUTANEOUS | Status: DC

## 2014-06-20 NOTE — Telephone Encounter (Signed)
Rx request for diflorasone-emollient (APEXICON E) 0.05 % CREA- apply bid 60 grams  [facility] [address]  Pls advise.

## 2014-06-20 NOTE — Telephone Encounter (Signed)
Yes please refill this for one year

## 2014-06-20 NOTE — Telephone Encounter (Signed)
Dr. Sarajane Jews, okay to refill Apexicon 0.05% cream?

## 2014-07-02 ENCOUNTER — Encounter: Payer: Self-pay | Admitting: Family Medicine

## 2014-07-02 ENCOUNTER — Ambulatory Visit (INDEPENDENT_AMBULATORY_CARE_PROVIDER_SITE_OTHER): Payer: Medicare Other | Admitting: Family Medicine

## 2014-07-02 VITALS — BP 151/96 | HR 97 | Temp 98.9°F | Ht 64.0 in | Wt 186.0 lb

## 2014-07-02 DIAGNOSIS — M159 Polyosteoarthritis, unspecified: Secondary | ICD-10-CM

## 2014-07-02 DIAGNOSIS — M150 Primary generalized (osteo)arthritis: Secondary | ICD-10-CM

## 2014-07-02 DIAGNOSIS — B3789 Other sites of candidiasis: Secondary | ICD-10-CM

## 2014-07-02 MED ORDER — KETOCONAZOLE 2 % EX CREA: 1.0000 "application " | TOPICAL_CREAM | Freq: Two times a day (BID) | CUTANEOUS | Status: DC

## 2014-07-02 MED ORDER — HYDROCODONE-ACETAMINOPHEN 5-325 MG PO TABS: 1.0000 | ORAL_TABLET | Freq: Four times a day (QID) | ORAL | Status: DC | PRN

## 2014-07-02 NOTE — Progress Notes (Signed)
   Subjective:    Patient ID: Connie Lang, female    DOB: 17-Dec-1940, 73 y.o.   MRN: [id number]  HPI Here for 2 things. First she has had a burning rash beneath both breasts for a month. Second she has had pain in a number of her joints lately, primarily the right shoulder, the right hip, and both knees. She takes nabumetone daily.    Review of Systems  Constitutional: Negative.   Musculoskeletal: Positive for arthralgias. Negative for joint swelling.  Skin: Positive for rash.       Objective:   Physical Exam  Constitutional: She appears well-developed and well-nourished.  Musculoskeletal:  Her right shoulder is tender, has limited ROM and has a lot of crepitus. The right hip also has pain with all motions   Skin:  Bands of red macerated macular rash under both breasts           Assessment & Plan:  Treat the yeast rash with ketoconazole. She has diffuse osteoarthritis so she is given some Vicodin to use prn. Refer to Rheumatology

## 2014-07-02 NOTE — Progress Notes (Signed)
Pre visit review using our clinic review tool, if applicable. No additional management support is needed unless otherwise documented below in the visit note. 

## 2014-08-05 ENCOUNTER — Telehealth: Payer: Self-pay | Admitting: Family Medicine

## 2014-08-05 NOTE — Telephone Encounter (Signed)
Pt called to ask for a refill on the following med HYDROcodone-acetaminophen (NORCO) 5-325 MG per tablet .   She is asking if Dr Sarajane Jews will up her to 10-325 mg . She said 5 is not doing her any good

## 2014-08-06 MED ORDER — HYDROCODONE-ACETAMINOPHEN 10-325 MG PO TABS: 1.0000 | ORAL_TABLET | Freq: Four times a day (QID) | ORAL | Status: DC | PRN

## 2014-08-06 NOTE — Telephone Encounter (Signed)
Script is ready for pick up and I left a message for pt.

## 2014-08-06 NOTE — Telephone Encounter (Signed)
done

## 2014-09-12 ENCOUNTER — Other Ambulatory Visit: Payer: Self-pay | Admitting: Family Medicine

## 2014-09-13 ENCOUNTER — Telehealth: Payer: Self-pay

## 2014-09-13 NOTE — Telephone Encounter (Signed)
-----   Message from Murphy sent at 09/13/2014  1:03 PM EST ----- PA was approved for 1 year.  ----- Message -----    From: Colleen Can    Sent: 09/12/2014   4:31 PM      To: [facility] sent over a request for pt's Flexeril 10 mg and in the comments noted prior auth needed.

## 2014-09-13 NOTE — Telephone Encounter (Signed)
Noted  

## 2014-09-26 ENCOUNTER — Ambulatory Visit (INDEPENDENT_AMBULATORY_CARE_PROVIDER_SITE_OTHER): Payer: Medicare Other | Admitting: Family Medicine

## 2014-09-26 ENCOUNTER — Ambulatory Visit (INDEPENDENT_AMBULATORY_CARE_PROVIDER_SITE_OTHER)
Admission: RE | Admit: 2014-09-26 | Discharge: 2014-09-26 | Disposition: A | Discharge status: Home / Self Care | Payer: Medicare Other | Source: Ambulatory Visit | Attending: Family Medicine | Admitting: Family Medicine

## 2014-09-26 ENCOUNTER — Encounter: Payer: Self-pay | Admitting: Family Medicine

## 2014-09-26 VITALS — BP 120/80 | Temp 97.8°F | Ht 63.5 in | Wt 188.0 lb

## 2014-09-26 DIAGNOSIS — Z0000 Encounter for general adult medical examination without abnormal findings: Secondary | ICD-10-CM

## 2014-09-26 DIAGNOSIS — R059 Cough, unspecified: Secondary | ICD-10-CM

## 2014-09-26 DIAGNOSIS — R05 Cough: Secondary | ICD-10-CM

## 2014-09-26 DIAGNOSIS — Z23 Encounter for immunization: Secondary | ICD-10-CM | POA: Diagnosis not present

## 2014-09-26 LAB — LIPID PANEL
CHOL/HDL RATIO: 3
Cholesterol: 189 mg/dL (ref 0–200)
HDL: 63 mg/dL (ref >39.00)
LDL Cholesterol: 91 mg/dL (ref 0–99)
NONHDL: 126
TRIGLYCERIDES: 175 mg/dL — AB (ref 0.0–149.0)
VLDL: 35 mg/dL (ref 0.0–40.0)

## 2014-09-26 LAB — BASIC METABOLIC PANEL
BUN: 14 mg/dL (ref 6–23)
CO2: 30 mEq/L (ref 19–32)
Calcium: 10.1 mg/dL (ref 8.4–10.5)
Chloride: 97 mEq/L (ref 96–112)
Creatinine, Ser: 0.71 mg/dL (ref 0.40–1.20)
GFR: 85.33 mL/min (ref >60.00)
GLUCOSE: 121 mg/dL — AB (ref 70–99)
POTASSIUM: 3.7 meq/L (ref 3.5–5.1)
SODIUM: 134 meq/L — AB (ref 135–145)

## 2014-09-26 LAB — CBC WITH DIFFERENTIAL/PLATELET
BASOS PCT: 0.7 % (ref 0.0–3.0)
Basophils Absolute: 0.1 10*3/uL (ref 0.0–0.1)
EOS PCT: 4.3 % (ref 0.0–5.0)
Eosinophils Absolute: 0.5 10*3/uL (ref 0.0–0.7)
HEMATOCRIT: 42.5 % (ref 36.0–46.0)
Hemoglobin: 14.6 g/dL (ref 12.0–15.0)
LYMPHS PCT: 22.1 % (ref 12.0–46.0)
Lymphs Abs: 2.4 10*3/uL (ref 0.7–4.0)
MCHC: 34.3 g/dL (ref 30.0–36.0)
MCV: 96.1 fl (ref 78.0–100.0)
MONO ABS: 1.1 10*3/uL — AB (ref 0.1–1.0)
MONOS PCT: 10.3 % (ref 3.0–12.0)
NEUTROS PCT: 62.6 % (ref 43.0–77.0)
Neutro Abs: 6.9 10*3/uL (ref 1.4–7.7)
PLATELETS: 245 10*3/uL (ref 150.0–400.0)
RBC: 4.42 Mil/uL (ref 3.87–5.11)
RDW: 13.3 % (ref 11.5–15.5)
WBC: 11 10*3/uL — AB (ref 4.0–10.5)

## 2014-09-26 LAB — POCT URINALYSIS DIPSTICK
Bilirubin, UA: NEGATIVE
Blood, UA: NEGATIVE
Glucose, UA: NEGATIVE
Ketones, UA: NEGATIVE
NITRITE UA: NEGATIVE
PH UA: 7.5
PROTEIN UA: NEGATIVE
Spec Grav, UA: 1.015
UROBILINOGEN UA: 0.2

## 2014-09-26 LAB — HEPATIC FUNCTION PANEL
ALT: 70 U/L — AB (ref 0–35)
AST: 65 U/L — AB (ref 0–37)
Albumin: 4.3 g/dL (ref 3.5–5.2)
Alkaline Phosphatase: 108 U/L (ref 39–117)
Bilirubin, Direct: 0.1 mg/dL (ref 0.0–0.3)
TOTAL PROTEIN: 7.5 g/dL (ref 6.0–8.3)
Total Bilirubin: 0.5 mg/dL (ref 0.2–1.2)

## 2014-09-26 LAB — TSH: TSH: 1.46 u[IU]/mL (ref 0.35–4.50)

## 2014-09-26 NOTE — Addendum Note (Signed)
Addended by: Colleen Can on: 09/26/2014 10:41 AM   Modules accepted: Orders

## 2014-09-26 NOTE — Progress Notes (Signed)
Pre visit review using our clinic review tool, if applicable. No additional management support is needed unless otherwise documented below in the visit note. 

## 2014-09-26 NOTE — Progress Notes (Signed)
   Subjective:    Patient ID: Connie Lang, female    DOB: 1940-08-31, 74 y.o.   MRN: [id number]  HPI 74 yr old female for a cpx. She feels well in general except for her joint pains. She saw Harrel Carina PA for a rheumatologic consult and her labs indicated she has some type of inflammatory arthritis on top of the osteoarthritis she has had for years. Her ESR was mildly elevated at 23 but her RF was negative. She was put on a prednisone taper and she responded immediately, and th stiffness and pain improved dramatically. She is due to follow up with rheumatology next week.    Review of Systems  Constitutional: Negative.   HENT: Negative.   Eyes: Negative.   Respiratory: Negative.   Cardiovascular: Negative.   Gastrointestinal: Negative.   Genitourinary: Negative for dysuria, urgency, frequency, hematuria, flank pain, decreased urine volume, enuresis, difficulty urinating, pelvic pain and dyspareunia.  Musculoskeletal: Negative.   Skin: Negative.   Neurological: Negative.   Psychiatric/Behavioral: Negative.        Objective:   Physical Exam  Constitutional: She is oriented to person, place, and time. She appears well-developed and well-nourished. No distress.  HENT:  Head: Normocephalic and atraumatic.  Right Ear: External ear normal.  Left Ear: External ear normal.  Nose: Nose normal.  Mouth/Throat: Oropharynx is clear and moist. No oropharyngeal exudate.  Eyes: Conjunctivae and EOM are normal. Pupils are equal, round, and reactive to light. No scleral icterus.  Neck: Normal range of motion. Neck supple. No JVD present. No thyromegaly present.  Cardiovascular: Normal rate, regular rhythm, normal heart sounds and intact distal pulses.  Exam reveals no gallop and no friction rub.   No murmur heard. EKG shows stable incomplete RBBB  Pulmonary/Chest: Effort normal and breath sounds normal. No respiratory distress. She has no wheezes. She has no rales. She exhibits no tenderness.    Abdominal: Soft. Bowel sounds are normal. She exhibits no distension and no mass. There is no tenderness. There is no rebound and no guarding.  Musculoskeletal: Normal range of motion. She exhibits no edema or tenderness.  Lymphadenopathy:    She has no cervical adenopathy.  Neurological: She is alert and oriented to person, place, and time. She has normal reflexes. No cranial nerve deficit. She exhibits normal muscle tone. Coordination normal.  Skin: Skin is warm and dry. No rash noted. No erythema.  Psychiatric: She has a normal mood and affect. Her behavior is normal. Judgment and thought content normal.          Assessment & Plan:  Well exam. Get fasting labs. Follow up with rheumatology as above

## 2014-09-26 NOTE — Addendum Note (Signed)
Addended by: Alysia Penna A on: 09/26/2014 10:23 AM   Modules accepted: Orders

## 2014-09-27 ENCOUNTER — Other Ambulatory Visit: Payer: Self-pay

## 2014-09-27 DIAGNOSIS — Z1231 Encounter for screening mammogram for malignant neoplasm of breast: Secondary | ICD-10-CM

## 2014-11-05 ENCOUNTER — Ambulatory Visit
Admission: RE | Admit: 2014-11-05 | Discharge: 2014-11-05 | Disposition: A | Discharge status: Home / Self Care | Payer: Medicare Other | Source: Ambulatory Visit

## 2014-11-05 DIAGNOSIS — Z1231 Encounter for screening mammogram for malignant neoplasm of breast: Secondary | ICD-10-CM

## 2014-12-11 ENCOUNTER — Encounter: Payer: Self-pay | Admitting: Family Medicine

## 2015-01-02 ENCOUNTER — Telehealth: Payer: Self-pay | Admitting: Family Medicine

## 2015-01-02 NOTE — Telephone Encounter (Signed)
Pt needs new rx hydrocodone. Pt only has 4 pills left

## 2015-01-03 MED ORDER — HYDROCODONE-ACETAMINOPHEN 10-325 MG PO TABS: 1.0000 | ORAL_TABLET | Freq: Four times a day (QID) | ORAL | Status: DC | PRN

## 2015-01-03 NOTE — Telephone Encounter (Signed)
done

## 2015-01-03 NOTE — Telephone Encounter (Signed)
Script is ready for pick up and I left a message.

## 2015-02-04 ENCOUNTER — Other Ambulatory Visit: Payer: Self-pay | Admitting: Family Medicine

## 2015-02-04 ENCOUNTER — Telehealth: Payer: Self-pay | Admitting: Family Medicine

## 2015-02-04 MED ORDER — HYDROCODONE-ACETAMINOPHEN 10-325 MG PO TABS: 1.0000 | ORAL_TABLET | Freq: Four times a day (QID) | ORAL | Status: DC | PRN

## 2015-02-04 NOTE — Telephone Encounter (Signed)
done

## 2015-02-04 NOTE — Telephone Encounter (Signed)
Pt request refill of the following: HYDROcodone-acetaminophen (NORCO) 10-325 MG per tablet ° ° °Phamacy: ° °

## 2015-02-05 NOTE — Telephone Encounter (Signed)
Script is ready for pick up and I sent pt a my chart message with this information.

## 2015-03-03 ENCOUNTER — Telehealth: Payer: Self-pay | Admitting: Family Medicine

## 2015-03-03 ENCOUNTER — Other Ambulatory Visit: Payer: Self-pay | Admitting: Family Medicine

## 2015-03-03 NOTE — Telephone Encounter (Addendum)
Husband called to request HYDROcodone-acetaminophen (NORCO) 10-325 MG per tablet

## 2015-03-04 MED ORDER — HYDROCODONE-ACETAMINOPHEN 10-325 MG PO TABS: 1.0000 | ORAL_TABLET | Freq: Four times a day (QID) | ORAL | Status: DC | PRN

## 2015-03-04 NOTE — Telephone Encounter (Signed)
done

## 2015-03-04 NOTE — Telephone Encounter (Signed)
Script is ready for pick up and I left a message.

## 2015-03-20 ENCOUNTER — Telehealth: Payer: Self-pay | Admitting: Family Medicine

## 2015-03-20 DIAGNOSIS — M810 Age-related osteoporosis without current pathological fracture: Secondary | ICD-10-CM

## 2015-03-20 NOTE — Telephone Encounter (Signed)
Pt needs a repeat dexa. She is scheduled at [facility] but needs an order put in please.

## 2015-03-21 NOTE — Telephone Encounter (Signed)
Test was ordered

## 2015-03-21 NOTE — Telephone Encounter (Signed)
I spoke with pt  

## 2015-03-27 ENCOUNTER — Ambulatory Visit (INDEPENDENT_AMBULATORY_CARE_PROVIDER_SITE_OTHER)
Admission: RE | Admit: 2015-03-27 | Discharge: 2015-03-27 | Disposition: A | Discharge status: Home / Self Care | Payer: Medicare Other | Source: Ambulatory Visit | Attending: Family Medicine | Admitting: Family Medicine

## 2015-03-27 DIAGNOSIS — M810 Age-related osteoporosis without current pathological fracture: Secondary | ICD-10-CM | POA: Diagnosis not present

## 2015-04-08 ENCOUNTER — Telehealth: Payer: Self-pay | Admitting: Family Medicine

## 2015-04-08 MED ORDER — HYDROCODONE-ACETAMINOPHEN 10-325 MG PO TABS: 1.0000 | ORAL_TABLET | Freq: Four times a day (QID) | ORAL | Status: DC | PRN

## 2015-04-08 NOTE — Telephone Encounter (Signed)
done

## 2015-04-08 NOTE — Telephone Encounter (Signed)
Pt needs new rx hydrocodone °

## 2015-04-10 ENCOUNTER — Ambulatory Visit (INDEPENDENT_AMBULATORY_CARE_PROVIDER_SITE_OTHER): Payer: Medicare Other

## 2015-04-10 DIAGNOSIS — Z23 Encounter for immunization: Secondary | ICD-10-CM

## 2015-04-10 NOTE — Telephone Encounter (Signed)
Script is ready for pick up and I spoke with pt.  

## 2015-05-12 ENCOUNTER — Telehealth: Payer: Self-pay | Admitting: Family Medicine

## 2015-05-12 NOTE — Telephone Encounter (Signed)
Patient's husband called to have his wife's hydrocodone prescription refilled.

## 2015-05-13 MED ORDER — HYDROCODONE-ACETAMINOPHEN 10-325 MG PO TABS: 1.0000 | ORAL_TABLET | Freq: Four times a day (QID) | ORAL | Status: DC | PRN

## 2015-05-13 NOTE — Telephone Encounter (Signed)
done

## 2015-05-13 NOTE — Telephone Encounter (Signed)
Script is ready for pick up and I left a message.

## 2015-05-21 ENCOUNTER — Encounter: Payer: Self-pay | Admitting: Family Medicine

## 2015-05-22 NOTE — Telephone Encounter (Signed)
Have her take an extra Amlodipine tablet this am and see me tomorrow

## 2015-05-22 NOTE — Telephone Encounter (Signed)
I spoke with pt and went over below information, she will schedule appointment for tomorrow 05/23/2015 will Dr. Sarajane Jews.

## 2015-05-23 ENCOUNTER — Ambulatory Visit (INDEPENDENT_AMBULATORY_CARE_PROVIDER_SITE_OTHER): Payer: Medicare Other | Admitting: Family Medicine

## 2015-05-23 ENCOUNTER — Encounter: Payer: Self-pay | Admitting: Family Medicine

## 2015-05-23 VITALS — BP 153/91 | HR 95 | Temp 99.2°F | Ht 63.5 in | Wt 199.0 lb

## 2015-05-23 DIAGNOSIS — R079 Chest pain, unspecified: Secondary | ICD-10-CM

## 2015-05-23 DIAGNOSIS — I10 Essential (primary) hypertension: Secondary | ICD-10-CM

## 2015-05-23 LAB — BASIC METABOLIC PANEL
BUN: 18 mg/dL (ref 6–23)
CO2: 32 mEq/L (ref 19–32)
CREATININE: 0.76 mg/dL (ref 0.40–1.20)
Calcium: 10.2 mg/dL (ref 8.4–10.5)
Chloride: 96 mEq/L (ref 96–112)
GFR: 78.74 mL/min (ref >60.00)
GLUCOSE: 105 mg/dL — AB (ref 70–99)
Potassium: 3.8 mEq/L (ref 3.5–5.1)
Sodium: 141 mEq/L (ref 135–145)

## 2015-05-23 MED ORDER — AMLODIPINE BESYLATE 10 MG PO TABS: 10.0000 mg | ORAL_TABLET | Freq: Every day | ORAL | Status: DC

## 2015-05-23 NOTE — Progress Notes (Signed)
   Subjective:    Patient ID: Connie Lang, female    DOB: 08-17-1940, 74 y.o.   MRN: [id number]  HPI Here to follow up on HTN and to discuss an episode of chest pain and SOB that occurred last week. This lasted about 30 minutes and went away. The pain radiated up into both sides of her neck. Her BP at home has been elevated in the range of 150s over 90s. Se has been on Prednisone for about a year per Dr. Amil Amen for PMR, and she only gets pain relief at doses of 10 mg a day or more. She had a normal ECHO last year.   Review of Systems  Constitutional: Negative.   Respiratory: Positive for shortness of breath. Negative for cough and wheezing.   Cardiovascular: Positive for chest pain. Negative for palpitations and leg swelling.  Musculoskeletal: Positive for arthralgias and gait problem.       Objective:   Physical Exam  Constitutional: She appears well-developed and well-nourished. No distress.  Morbidly obese   Neck: Neck supple. No thyromegaly present.  Cardiovascular: Normal rate, regular rhythm, normal heart sounds and intact distal pulses.   No murmur heard. Pulmonary/Chest: Effort normal and breath sounds normal. No respiratory distress. She has no wheezes. She has no rales.  Musculoskeletal: She exhibits no edema.  Lymphadenopathy:    She has no cervical adenopathy.          Assessment & Plan:  For the HTN, we will increase the dose of Amlodipine to 10 mg daily. We need to rule out ischemia so we will schedule a stress test soon.

## 2015-05-23 NOTE — Progress Notes (Signed)
Pre visit review using our clinic review tool, if applicable. No additional management support is needed unless otherwise documented below in the visit note. 

## 2015-06-10 ENCOUNTER — Telehealth: Payer: Self-pay | Admitting: *Deleted

## 2015-06-10 ENCOUNTER — Ambulatory Visit (HOSPITAL_COMMUNITY): Payer: Medicare Other | Attending: Cardiology

## 2015-06-10 DIAGNOSIS — R0609 Other forms of dyspnea: Secondary | ICD-10-CM | POA: Insufficient documentation

## 2015-06-10 DIAGNOSIS — I10 Essential (primary) hypertension: Secondary | ICD-10-CM | POA: Insufficient documentation

## 2015-06-10 DIAGNOSIS — R002 Palpitations: Secondary | ICD-10-CM | POA: Insufficient documentation

## 2015-06-10 DIAGNOSIS — R079 Chest pain, unspecified: Secondary | ICD-10-CM | POA: Diagnosis present

## 2015-06-10 MED ORDER — REGADENOSON 0.4 MG/5ML IV SOLN
0.4000 mg | Freq: Once | INTRAVENOUS | Status: AC
  Administered 2015-06-10: 0.4 mg via INTRAVENOUS

## 2015-06-10 MED ORDER — TECHNETIUM TC 99M SESTAMIBI GENERIC - CARDIOLITE
33.0000 | Freq: Once | INTRAVENOUS | Status: AC | PRN
  Administered 2015-06-10: 33 via INTRAVENOUS

## 2015-06-10 NOTE — Telephone Encounter (Signed)
Walked into office and [facility] on [address]. Changed in system.

## 2015-06-12 ENCOUNTER — Other Ambulatory Visit: Payer: Self-pay | Admitting: Family Medicine

## 2015-06-12 ENCOUNTER — Ambulatory Visit (HOSPITAL_COMMUNITY): Payer: Medicare Other | Attending: Cardiology

## 2015-06-12 LAB — MYOCARDIAL PERFUSION IMAGING
CSEPPHR: 100 {beats}/min
LVDIAVOL: 65 mL
LVSYSVOL: 20 mL
NUC STRESS TID: 1.15
RATE: 0.37
Rest HR: 93 {beats}/min
SDS: 6
SRS: 1
SSS: 7

## 2015-06-12 MED ORDER — TECHNETIUM TC 99M SESTAMIBI GENERIC - CARDIOLITE
33.0000 | Freq: Once | INTRAVENOUS | Status: AC | PRN
  Administered 2015-06-12: 33 via INTRAVENOUS

## 2015-06-13 ENCOUNTER — Other Ambulatory Visit: Payer: Self-pay | Admitting: Family Medicine

## 2015-06-17 ENCOUNTER — Telehealth: Payer: Self-pay | Admitting: Family Medicine

## 2015-06-17 ENCOUNTER — Other Ambulatory Visit: Payer: Self-pay | Admitting: Family Medicine

## 2015-06-17 MED ORDER — HYDROCODONE-ACETAMINOPHEN 10-325 MG PO TABS: 1.0000 | ORAL_TABLET | Freq: Four times a day (QID) | ORAL | Status: DC | PRN

## 2015-06-17 NOTE — Telephone Encounter (Signed)
Pt need new hydrocodone

## 2015-06-17 NOTE — Telephone Encounter (Signed)
done

## 2015-06-18 NOTE — Telephone Encounter (Signed)
I sent pt a my chart message, script ready for pick up.

## 2015-06-19 ENCOUNTER — Other Ambulatory Visit: Payer: Self-pay | Admitting: Family Medicine

## 2015-06-19 MED ORDER — CYCLOBENZAPRINE HCL 10 MG PO TABS: ORAL_TABLET | ORAL | Status: DC

## 2015-06-24 ENCOUNTER — Encounter: Payer: Self-pay | Admitting: Family Medicine

## 2015-06-26 ENCOUNTER — Telehealth: Payer: Self-pay | Admitting: Family Medicine

## 2015-06-26 MED ORDER — FUROSEMIDE 20 MG PO TABS: 20.0000 mg | ORAL_TABLET | Freq: Every day | ORAL | Status: DC

## 2015-06-26 NOTE — Telephone Encounter (Signed)
I spoke with pt and went over all of the below information. Updated medication list, sent new script for Lasix e-scribe.

## 2015-06-26 NOTE — Telephone Encounter (Signed)
The swelling is probably coming from the Amlidipine. We increased the dose from 5 mg to 10 mg a day about the same time this swelling appeared, and this is a common side effect of high dose Amlodipine. I suggest decreasing the dose back down to 5 mg a day. Stop the HCTZ and switch to Lasix 20 mg to take daily. Call in #30 with 5 rf

## 2015-06-26 NOTE — Telephone Encounter (Signed)
Updated medication list

## 2015-07-03 ENCOUNTER — Encounter: Payer: Self-pay | Admitting: Family Medicine

## 2015-07-06 DIAGNOSIS — C50919 Malignant neoplasm of unspecified site of unspecified female breast: Secondary | ICD-10-CM

## 2015-07-06 HISTORY — DX: Malignant neoplasm of unspecified site of unspecified female breast: C50.919

## 2015-07-07 NOTE — Telephone Encounter (Signed)
Try taking the Lasix every other day. Drink plenty of water

## 2015-07-08 ENCOUNTER — Telehealth: Payer: Self-pay | Admitting: Family Medicine

## 2015-07-08 MED ORDER — HYDROCODONE-ACETAMINOPHEN 10-325 MG PO TABS: 1.0000 | ORAL_TABLET | Freq: Four times a day (QID) | ORAL | Status: DC | PRN

## 2015-07-08 NOTE — Telephone Encounter (Signed)
Patient notified that Rx ready for pick up. Thanks!

## 2015-07-08 NOTE — Telephone Encounter (Signed)
Needs a refill on her hydrocodone please call when ready for pick up.

## 2015-07-08 NOTE — Telephone Encounter (Signed)
Please my last phone note

## 2015-07-08 NOTE — Telephone Encounter (Signed)
done

## 2015-07-09 ENCOUNTER — Telehealth: Payer: Self-pay

## 2015-07-09 NOTE — Telephone Encounter (Signed)
Patient contacted

## 2015-07-09 NOTE — Telephone Encounter (Signed)
I notified patient to take Lasix every other day - along with drinking plenty plenty of water per Dr. Sarajane Jews. Patient now states that the swelling is worse in her feet and ankles. She states that she doesn't want to cut back her her medications because she doesn't want the swelling to get worse. Patient wants to know if an appt is needed? Or if she needs to adjust dosage?

## 2015-07-09 NOTE — Telephone Encounter (Signed)
Appt scheduled

## 2015-07-09 NOTE — Telephone Encounter (Signed)
Please call the patient. 

## 2015-07-09 NOTE — Telephone Encounter (Signed)
Have her make another OV to work this out

## 2015-07-14 ENCOUNTER — Ambulatory Visit: Payer: Medicare Other | Admitting: Family Medicine

## 2015-07-23 ENCOUNTER — Encounter: Payer: Self-pay | Admitting: Family Medicine

## 2015-07-23 ENCOUNTER — Ambulatory Visit (INDEPENDENT_AMBULATORY_CARE_PROVIDER_SITE_OTHER): Payer: Medicare Other | Admitting: Family Medicine

## 2015-07-23 VITALS — BP 128/82 | HR 102 | Temp 98.4°F | Ht 64.0 in | Wt 202.0 lb

## 2015-07-23 DIAGNOSIS — M353 Polymyalgia rheumatica: Secondary | ICD-10-CM | POA: Diagnosis not present

## 2015-07-23 DIAGNOSIS — R609 Edema, unspecified: Secondary | ICD-10-CM | POA: Diagnosis not present

## 2015-07-23 NOTE — Progress Notes (Signed)
   Subjective:    Patient ID: Connie Lang, female    DOB: September 30, 1940, 75 y.o.   MRN: [id number]  HPI Here to discuss swelling in the feet, hands, and face that started 2 months ago. No chest pain or SOB. Her renal function is normal, as seen on a BMET from November. She has been taking daily Prednisone per Dr. Amil Amen for PMR, and no doubt the swelling is a side effect of this. We started her on Lasix 20 mg daily recently and this has helped a lot. Her facial swelling and hand swelling have resolved and the foot swelling has improved. She has also seen Dr. Sherwood Gambler for low back pain and he has diagnosed her with spinal stenosis. She is scheduled for some cortisone shots for this in 2 weeks.   Review of Systems  Constitutional: Negative.   Respiratory: Negative.   Cardiovascular: Positive for leg swelling. Negative for chest pain and palpitations.  Gastrointestinal: Negative.   Neurological: Negative.        Objective:   Physical Exam  Constitutional: She appears well-developed and well-nourished. No distress.  Neck: No thyromegaly present.  Cardiovascular: Normal rate, regular rhythm, normal heart sounds and intact distal pulses.   Pulmonary/Chest: Effort normal and breath sounds normal. No respiratory distress. She has no wheezes. She has no rales.  Musculoskeletal:  2+ edema to the feet   Lymphadenopathy:    She has no cervical adenopathy.          Assessment & Plan:  Her edema is the result of daily steroid use. The Lasix is counter balancing this somewhat. Hopefully she can stop the prednisone at some point soon. Recheck prn

## 2015-07-23 NOTE — Progress Notes (Signed)
Pre visit review using our clinic review tool, if applicable. No additional management support is needed unless otherwise documented below in the visit note. 

## 2015-08-05 ENCOUNTER — Telehealth: Payer: Self-pay | Admitting: Family Medicine

## 2015-08-05 NOTE — Telephone Encounter (Signed)
Pt needs new rx hydrocodone °

## 2015-08-06 MED ORDER — HYDROCODONE-ACETAMINOPHEN 10-325 MG PO TABS: 1.0000 | ORAL_TABLET | Freq: Four times a day (QID) | ORAL | Status: DC | PRN

## 2015-08-06 NOTE — Telephone Encounter (Signed)
Script is ready for pick up and I left a message.

## 2015-08-06 NOTE — Telephone Encounter (Signed)
done

## 2015-08-29 ENCOUNTER — Other Ambulatory Visit: Payer: Self-pay | Admitting: Family Medicine

## 2015-08-29 NOTE — Telephone Encounter (Signed)
Can we refill this? 

## 2015-09-01 ENCOUNTER — Telehealth: Payer: Self-pay | Admitting: Family Medicine

## 2015-09-01 NOTE — Telephone Encounter (Signed)
Pt would like to know if her KLOR-CON M20 20 MEQ tablet can be changed   to a 10 MEQ.  Pt states the 20 is just too big and she tried cutting in half but still gets  cault in her throat.  Pt has not picked up this rx so would like it changed.  KLOR-CON M20 20 MEQ   90 day  180 tabs  [facility]

## 2015-09-01 NOTE — Telephone Encounter (Signed)
Change this to Klor-con 10 mEq to take 2 tabs daily, #180 with 3 rf

## 2015-09-02 ENCOUNTER — Other Ambulatory Visit: Payer: Self-pay

## 2015-09-02 MED ORDER — POTASSIUM CHLORIDE ER 10 MEQ PO TBCR: EXTENDED_RELEASE_TABLET | ORAL | Status: DC

## 2015-09-02 NOTE — Telephone Encounter (Signed)
New Rx sent in - patient notified.

## 2015-09-08 ENCOUNTER — Telehealth: Payer: Self-pay | Admitting: Family Medicine

## 2015-09-08 MED ORDER — HYDROCODONE-ACETAMINOPHEN 10-325 MG PO TABS: 1.0000 | ORAL_TABLET | Freq: Four times a day (QID) | ORAL | Status: DC | PRN

## 2015-09-08 NOTE — Telephone Encounter (Signed)
Pt needs new rx hydrocodone °

## 2015-09-08 NOTE — Telephone Encounter (Signed)
done

## 2015-09-09 ENCOUNTER — Other Ambulatory Visit: Payer: Self-pay | Admitting: Family Medicine

## 2015-09-09 NOTE — Telephone Encounter (Signed)
Rx is ready for pick up pt is aware

## 2015-09-19 DIAGNOSIS — M4316 Spondylolisthesis, lumbar region: Secondary | ICD-10-CM | POA: Diagnosis not present

## 2015-09-19 DIAGNOSIS — M4726 Other spondylosis with radiculopathy, lumbar region: Secondary | ICD-10-CM | POA: Diagnosis not present

## 2015-09-19 DIAGNOSIS — M4806 Spinal stenosis, lumbar region: Secondary | ICD-10-CM | POA: Diagnosis not present

## 2015-09-19 DIAGNOSIS — M5136 Other intervertebral disc degeneration, lumbar region: Secondary | ICD-10-CM | POA: Diagnosis not present

## 2015-09-24 ENCOUNTER — Encounter: Payer: Medicare Other | Admitting: Family Medicine

## 2015-09-25 DIAGNOSIS — M4726 Other spondylosis with radiculopathy, lumbar region: Secondary | ICD-10-CM | POA: Diagnosis not present

## 2015-09-25 DIAGNOSIS — M5136 Other intervertebral disc degeneration, lumbar region: Secondary | ICD-10-CM | POA: Diagnosis not present

## 2015-09-25 DIAGNOSIS — M4806 Spinal stenosis, lumbar region: Secondary | ICD-10-CM | POA: Diagnosis not present

## 2015-09-26 DIAGNOSIS — T8090XA Unspecified complication following infusion and therapeutic injection, initial encounter: Secondary | ICD-10-CM | POA: Diagnosis not present

## 2015-09-29 DIAGNOSIS — M8580 Other specified disorders of bone density and structure, unspecified site: Secondary | ICD-10-CM | POA: Diagnosis not present

## 2015-09-29 DIAGNOSIS — M5136 Other intervertebral disc degeneration, lumbar region: Secondary | ICD-10-CM | POA: Diagnosis not present

## 2015-09-29 DIAGNOSIS — M150 Primary generalized (osteo)arthritis: Secondary | ICD-10-CM | POA: Diagnosis not present

## 2015-09-29 DIAGNOSIS — Z7952 Long term (current) use of systemic steroids: Secondary | ICD-10-CM | POA: Diagnosis not present

## 2015-09-29 DIAGNOSIS — M353 Polymyalgia rheumatica: Secondary | ICD-10-CM | POA: Diagnosis not present

## 2015-10-02 ENCOUNTER — Telehealth: Payer: Self-pay | Admitting: Family Medicine

## 2015-10-02 ENCOUNTER — Other Ambulatory Visit: Payer: Self-pay

## 2015-10-02 ENCOUNTER — Encounter: Payer: Self-pay | Admitting: Family Medicine

## 2015-10-02 ENCOUNTER — Ambulatory Visit (INDEPENDENT_AMBULATORY_CARE_PROVIDER_SITE_OTHER): Payer: Medicare Other | Admitting: Family Medicine

## 2015-10-02 VITALS — BP 157/112 | HR 89 | Temp 99.1°F | Ht 64.0 in | Wt 207.0 lb

## 2015-10-02 DIAGNOSIS — Z0000 Encounter for general adult medical examination without abnormal findings: Secondary | ICD-10-CM

## 2015-10-02 DIAGNOSIS — Z1231 Encounter for screening mammogram for malignant neoplasm of breast: Secondary | ICD-10-CM

## 2015-10-02 DIAGNOSIS — K140 Glossitis: Secondary | ICD-10-CM

## 2015-10-02 DIAGNOSIS — I10 Essential (primary) hypertension: Secondary | ICD-10-CM | POA: Diagnosis not present

## 2015-10-02 DIAGNOSIS — D72829 Elevated white blood cell count, unspecified: Secondary | ICD-10-CM

## 2015-10-02 LAB — BASIC METABOLIC PANEL
BUN: 23 mg/dL (ref 6–23)
CALCIUM: 10.9 mg/dL — AB (ref 8.4–10.5)
CO2: 30 mEq/L (ref 19–32)
CREATININE: 0.83 mg/dL (ref 0.40–1.20)
Chloride: 98 mEq/L (ref 96–112)
GFR: 71.06 mL/min (ref >60.00)
Glucose, Bld: 110 mg/dL — ABNORMAL HIGH (ref 70–99)
POTASSIUM: 4.4 meq/L (ref 3.5–5.1)
Sodium: 138 mEq/L (ref 135–145)

## 2015-10-02 LAB — CBC WITH DIFFERENTIAL/PLATELET
BASOS ABS: 0 10*3/uL (ref 0.0–0.1)
Basophils Relative: 0.1 % (ref 0.0–3.0)
EOS ABS: 0 10*3/uL (ref 0.0–0.7)
Eosinophils Relative: 0.2 % (ref 0.0–5.0)
HEMATOCRIT: 44.9 % (ref 36.0–46.0)
Hemoglobin: 15.3 g/dL — ABNORMAL HIGH (ref 12.0–15.0)
LYMPHS PCT: 11.2 % — AB (ref 12.0–46.0)
Lymphs Abs: 2.1 10*3/uL (ref 0.7–4.0)
MCHC: 34 g/dL (ref 30.0–36.0)
MCV: 99.6 fl (ref 78.0–100.0)
MONO ABS: 1.3 10*3/uL — AB (ref 0.1–1.0)
Monocytes Relative: 6.8 % (ref 3.0–12.0)
NEUTROS ABS: 15 10*3/uL — AB (ref 1.4–7.7)
NEUTROS PCT: 81.7 % — AB (ref 43.0–77.0)
PLATELETS: 269 10*3/uL (ref 150.0–400.0)
RBC: 4.51 Mil/uL (ref 3.87–5.11)
RDW: 13.4 % (ref 11.5–15.5)
WBC: 18.4 10*3/uL (ref 4.0–10.5)

## 2015-10-02 LAB — HEPATIC FUNCTION PANEL
ALT: 32 U/L (ref 0–35)
AST: 28 U/L (ref 0–37)
Albumin: 4.3 g/dL (ref 3.5–5.2)
Alkaline Phosphatase: 75 U/L (ref 39–117)
BILIRUBIN TOTAL: 0.7 mg/dL (ref 0.2–1.2)
Bilirubin, Direct: 0.1 mg/dL (ref 0.0–0.3)
Total Protein: 6.9 g/dL (ref 6.0–8.3)

## 2015-10-02 LAB — POC URINALSYSI DIPSTICK (AUTOMATED)
BILIRUBIN UA: NEGATIVE
Glucose, UA: NEGATIVE
Ketones, UA: NEGATIVE
NITRITE UA: NEGATIVE
PH UA: 7.5
Protein, UA: NEGATIVE
RBC UA: NEGATIVE
Spec Grav, UA: 1.015
UROBILINOGEN UA: 0.2

## 2015-10-02 LAB — LIPID PANEL
CHOL/HDL RATIO: 3
Cholesterol: 253 mg/dL — ABNORMAL HIGH (ref 0–200)
HDL: 80.9 mg/dL (ref >39.00)
LDL CALC: 136 mg/dL — AB (ref 0–99)
NonHDL: 171.73
TRIGLYCERIDES: 178 mg/dL — AB (ref 0.0–149.0)
VLDL: 35.6 mg/dL (ref 0.0–40.0)

## 2015-10-02 LAB — TSH: TSH: 0.56 u[IU]/mL (ref 0.35–4.50)

## 2015-10-02 LAB — VITAMIN B12: VITAMIN B 12: 1017 pg/mL — AB (ref 211–911)

## 2015-10-02 MED ORDER — AMLODIPINE BESYLATE 10 MG PO TABS: 10.0000 mg | ORAL_TABLET | Freq: Every day | ORAL | Status: DC

## 2015-10-02 MED ORDER — FUROSEMIDE 20 MG PO TABS: 20.0000 mg | ORAL_TABLET | Freq: Every day | ORAL | Status: DC

## 2015-10-02 MED ORDER — SIMVASTATIN 20 MG PO TABS: 20.0000 mg | ORAL_TABLET | Freq: Every day | ORAL | Status: DC

## 2015-10-02 MED ORDER — POTASSIUM CHLORIDE ER 10 MEQ PO TBCR: EXTENDED_RELEASE_TABLET | ORAL | Status: DC

## 2015-10-02 MED ORDER — PANTOPRAZOLE SODIUM 40 MG PO TBEC: 40.0000 mg | DELAYED_RELEASE_TABLET | Freq: Every day | ORAL | Status: DC

## 2015-10-02 MED ORDER — LISINOPRIL 10 MG PO TABS: 10.0000 mg | ORAL_TABLET | Freq: Every day | ORAL | Status: DC

## 2015-10-02 NOTE — Progress Notes (Signed)
   Subjective:    Patient ID: Connie Lang, female    DOB: [birth year], 74 y.o.   MRN: [id number]  HPI 75 yr old female for a cpx. She feels well except for her joint pains and low back pain. She had another lumbar ESI this week per Dr. Sherwood Gambler.    Review of Systems  Constitutional: Negative.   HENT: Negative.   Eyes: Negative.   Respiratory: Negative.   Cardiovascular: Negative.   Gastrointestinal: Negative.   Genitourinary: Negative for dysuria, urgency, frequency, hematuria, flank pain, decreased urine volume, enuresis, difficulty urinating, pelvic pain and dyspareunia.  Musculoskeletal: Positive for back pain, arthralgias, neck pain and neck stiffness.  Skin: Negative.   Neurological: Negative.   Psychiatric/Behavioral: Negative.        Objective:   Physical Exam  Constitutional: She is oriented to person, place, and time. She appears well-developed and well-nourished. No distress.  obese  HENT:  Head: Normocephalic and atraumatic.  Right Ear: External ear normal.  Left Ear: External ear normal.  Nose: Nose normal.  Mouth/Throat: Oropharynx is clear and moist. No oropharyngeal exudate.  Eyes: Conjunctivae and EOM are normal. Pupils are equal, round, and reactive to light. No scleral icterus.  Neck: Normal range of motion. Neck supple. No JVD present. No thyromegaly present.  Cardiovascular: Normal rate, regular rhythm, normal heart sounds and intact distal pulses.  Exam reveals no gallop and no friction rub.   No murmur heard. EKG normal   Pulmonary/Chest: Effort normal and breath sounds normal. No respiratory distress. She has no wheezes. She has no rales. She exhibits no tenderness.  Abdominal: Soft. Bowel sounds are normal. She exhibits no distension and no mass. There is no tenderness. There is no rebound and no guarding.  Musculoskeletal: Normal range of motion. She exhibits no edema or tenderness.  Lymphadenopathy:    She has no cervical adenopathy.    Neurological: She is alert and oriented to person, place, and time. She has normal reflexes. No cranial nerve deficit. She exhibits normal muscle tone. Coordination normal.  Skin: Skin is warm and dry. No rash noted. No erythema.  Psychiatric: She has a normal mood and affect. Her behavior is normal. Judgment and thought content normal.          Assessment & Plan:  Well exam. We discussed diet and exercise. Add Lisinopril 10 mg daily for the HTN. Get fasting labs

## 2015-10-02 NOTE — Telephone Encounter (Signed)
Manuela Schwartz from the lab called to report the pt has a critical WBC count of 18.4.  Called and reported this information to Dr. Sarajane Jews.  He stated it is okay for this to wait until the next business day when he returns (today is his half day).  Also stated the pt had an epidural steroid injection 2-3 days ago and this could be the reason of the increase.  Will forward to Dr. Sarajane Jews.

## 2015-10-02 NOTE — Progress Notes (Signed)
Pre visit review using our clinic review tool, if applicable. No additional management support is needed unless otherwise documented below in the visit note. 

## 2015-10-03 NOTE — Addendum Note (Signed)
Addended by: Alysia Penna A on: 10/03/2015 01:21 PM   Modules accepted: Orders

## 2015-10-06 ENCOUNTER — Telehealth: Payer: Self-pay | Admitting: Family Medicine

## 2015-10-06 ENCOUNTER — Other Ambulatory Visit: Payer: Self-pay | Admitting: Family Medicine

## 2015-10-06 MED ORDER — CIPROFLOXACIN HCL 500 MG PO TABS: 500.0000 mg | ORAL_TABLET | Freq: Two times a day (BID) | ORAL | Status: DC

## 2015-10-06 MED ORDER — HYDROCODONE-ACETAMINOPHEN 10-325 MG PO TABS: 1.0000 | ORAL_TABLET | ORAL | Status: DC | PRN

## 2015-10-06 NOTE — Telephone Encounter (Signed)
Pt request refill of the following:  HYDROcodone-acetaminophen (NORCO) 10-325 MG tablet  Pt said she is taking 5 a day and need a 150 tablets    Phamacy:

## 2015-10-06 NOTE — Telephone Encounter (Signed)
done

## 2015-10-06 NOTE — Telephone Encounter (Signed)
Script is ready for pick up and I sent pt a my chart message.

## 2015-10-21 ENCOUNTER — Other Ambulatory Visit (INDEPENDENT_AMBULATORY_CARE_PROVIDER_SITE_OTHER): Payer: Medicare Other

## 2015-10-21 DIAGNOSIS — D72829 Elevated white blood cell count, unspecified: Secondary | ICD-10-CM | POA: Diagnosis not present

## 2015-10-21 LAB — CBC WITH DIFFERENTIAL/PLATELET
BASOS ABS: 0 10*3/uL (ref 0.0–0.1)
BASOS PCT: 0.2 % (ref 0.0–3.0)
EOS PCT: 0.8 % (ref 0.0–5.0)
Eosinophils Absolute: 0.1 10*3/uL (ref 0.0–0.7)
HCT: 41.2 % (ref 36.0–46.0)
HEMOGLOBIN: 13.9 g/dL (ref 12.0–15.0)
Lymphocytes Relative: 9.6 % — ABNORMAL LOW (ref 12.0–46.0)
Lymphs Abs: 1.4 10*3/uL (ref 0.7–4.0)
MCHC: 33.8 g/dL (ref 30.0–36.0)
MCV: 99.6 fl (ref 78.0–100.0)
MONO ABS: 1 10*3/uL (ref 0.1–1.0)
Monocytes Relative: 7.2 % (ref 3.0–12.0)
NEUTROS ABS: 12 10*3/uL — AB (ref 1.4–7.7)
Neutrophils Relative %: 82.2 % — ABNORMAL HIGH (ref 43.0–77.0)
Platelets: 226 10*3/uL (ref 150.0–400.0)
RBC: 4.13 Mil/uL (ref 3.87–5.11)
RDW: 13.6 % (ref 11.5–15.5)
WBC: 14.6 10*3/uL — AB (ref 4.0–10.5)

## 2015-11-04 ENCOUNTER — Telehealth: Payer: Self-pay | Admitting: Family Medicine

## 2015-11-04 MED ORDER — HYDROCODONE-ACETAMINOPHEN 10-325 MG PO TABS: 1.0000 | ORAL_TABLET | ORAL | Status: DC | PRN

## 2015-11-04 NOTE — Telephone Encounter (Signed)
° ° °  Pt request refill of the following: ° ° °HYDROcodone-acetaminophen (NORCO) 10-325 MG tablet ° ° °Phamacy: °

## 2015-11-04 NOTE — Telephone Encounter (Signed)
done

## 2015-11-04 NOTE — Telephone Encounter (Signed)
Script is ready for pick up and I left a voice message for pt. 

## 2015-11-06 ENCOUNTER — Ambulatory Visit
Admission: RE | Admit: 2015-11-06 | Discharge: 2015-11-06 | Disposition: A | Discharge status: Home / Self Care | Payer: Medicare Other | Source: Ambulatory Visit

## 2015-11-06 DIAGNOSIS — Z1231 Encounter for screening mammogram for malignant neoplasm of breast: Secondary | ICD-10-CM | POA: Diagnosis not present

## 2015-11-11 ENCOUNTER — Other Ambulatory Visit: Payer: Self-pay | Admitting: Family Medicine

## 2015-11-11 DIAGNOSIS — R928 Other abnormal and inconclusive findings on diagnostic imaging of breast: Secondary | ICD-10-CM

## 2015-11-18 ENCOUNTER — Other Ambulatory Visit: Payer: Self-pay | Admitting: Family Medicine

## 2015-11-18 ENCOUNTER — Other Ambulatory Visit: Payer: Self-pay

## 2015-11-18 DIAGNOSIS — R928 Other abnormal and inconclusive findings on diagnostic imaging of breast: Secondary | ICD-10-CM

## 2015-11-19 ENCOUNTER — Other Ambulatory Visit: Payer: Self-pay | Admitting: Family Medicine

## 2015-11-19 ENCOUNTER — Ambulatory Visit
Admission: RE | Admit: 2015-11-19 | Discharge: 2015-11-19 | Disposition: A | Discharge status: Home / Self Care | Payer: Medicare Other | Source: Ambulatory Visit | Attending: Family Medicine | Admitting: Family Medicine

## 2015-11-19 DIAGNOSIS — R928 Other abnormal and inconclusive findings on diagnostic imaging of breast: Secondary | ICD-10-CM

## 2015-11-19 DIAGNOSIS — R921 Mammographic calcification found on diagnostic imaging of breast: Secondary | ICD-10-CM | POA: Diagnosis not present

## 2015-11-21 ENCOUNTER — Encounter: Payer: Self-pay | Admitting: Family Medicine

## 2015-11-21 ENCOUNTER — Ambulatory Visit (INDEPENDENT_AMBULATORY_CARE_PROVIDER_SITE_OTHER): Payer: Medicare Other | Admitting: Family Medicine

## 2015-11-21 VITALS — BP 131/80 | HR 102 | Temp 98.6°F | Ht 64.0 in | Wt 216.0 lb

## 2015-11-21 DIAGNOSIS — R601 Generalized edema: Secondary | ICD-10-CM

## 2015-11-21 DIAGNOSIS — E669 Obesity, unspecified: Secondary | ICD-10-CM | POA: Diagnosis not present

## 2015-11-21 DIAGNOSIS — M353 Polymyalgia rheumatica: Secondary | ICD-10-CM

## 2015-11-21 DIAGNOSIS — I10 Essential (primary) hypertension: Secondary | ICD-10-CM

## 2015-11-21 LAB — POC URINALSYSI DIPSTICK (AUTOMATED)
BILIRUBIN UA: NEGATIVE
Glucose, UA: NEGATIVE
KETONES UA: NEGATIVE
NITRITE UA: NEGATIVE
PH UA: 7
PROTEIN UA: NEGATIVE
RBC UA: NEGATIVE
Spec Grav, UA: 1.02
Urobilinogen, UA: 0.2

## 2015-11-21 LAB — BASIC METABOLIC PANEL
BUN: 18 mg/dL (ref 6–23)
CALCIUM: 9.7 mg/dL (ref 8.4–10.5)
CO2: 26 meq/L (ref 19–32)
CREATININE: 0.8 mg/dL (ref 0.40–1.20)
Chloride: 102 mEq/L (ref 96–112)
GFR: 74.12 mL/min (ref >60.00)
Glucose, Bld: 130 mg/dL — ABNORMAL HIGH (ref 70–99)
Potassium: 4.1 mEq/L (ref 3.5–5.1)
Sodium: 139 mEq/L (ref 135–145)

## 2015-11-21 LAB — HEPATIC FUNCTION PANEL
ALBUMIN: 4.4 g/dL (ref 3.5–5.2)
ALT: 33 U/L (ref 0–35)
AST: 27 U/L (ref 0–37)
Alkaline Phosphatase: 77 U/L (ref 39–117)
BILIRUBIN DIRECT: 0.1 mg/dL (ref 0.0–0.3)
TOTAL PROTEIN: 6.6 g/dL (ref 6.0–8.3)
Total Bilirubin: 0.6 mg/dL (ref 0.2–1.2)

## 2015-11-21 LAB — CBC WITH DIFFERENTIAL/PLATELET
BASOS ABS: 0.1 10*3/uL (ref 0.0–0.1)
Basophils Relative: 0.4 % (ref 0.0–3.0)
EOS ABS: 0.1 10*3/uL (ref 0.0–0.7)
Eosinophils Relative: 1 % (ref 0.0–5.0)
HCT: 41.1 % (ref 36.0–46.0)
Hemoglobin: 13.8 g/dL (ref 12.0–15.0)
LYMPHS ABS: 1.7 10*3/uL (ref 0.7–4.0)
Lymphocytes Relative: 11.5 % — ABNORMAL LOW (ref 12.0–46.0)
MCHC: 33.6 g/dL (ref 30.0–36.0)
MCV: 101.5 fl — ABNORMAL HIGH (ref 78.0–100.0)
Monocytes Absolute: 1.3 10*3/uL — ABNORMAL HIGH (ref 0.1–1.0)
Monocytes Relative: 8.6 % (ref 3.0–12.0)
NEUTROS ABS: 11.8 10*3/uL — AB (ref 1.4–7.7)
NEUTROS PCT: 78.5 % — AB (ref 43.0–77.0)
PLATELETS: 266 10*3/uL (ref 150.0–400.0)
RBC: 4.05 Mil/uL (ref 3.87–5.11)
RDW: 13.6 % (ref 11.5–15.5)
WBC: 15 10*3/uL — ABNORMAL HIGH (ref 4.0–10.5)

## 2015-11-21 LAB — BRAIN NATRIURETIC PEPTIDE: PRO B NATRI PEPTIDE: 36 pg/mL (ref 0.0–100.0)

## 2015-11-21 LAB — TSH: TSH: 0.64 u[IU]/mL (ref 0.35–4.50)

## 2015-11-21 MED ORDER — FUROSEMIDE 40 MG PO TABS: 40.0000 mg | ORAL_TABLET | Freq: Two times a day (BID) | ORAL | Status: DC

## 2015-11-21 NOTE — Progress Notes (Signed)
Pre visit review using our clinic review tool, if applicable. No additional management support is needed unless otherwise documented below in the visit note. 

## 2015-11-21 NOTE — Progress Notes (Signed)
   Subjective:    Patient ID: Connie Lang, female    DOB: 05-Dec-1940, 75 y.o.   MRN: [id number]  HPI Here for the fairly rapid worsening of swelling in the legs in the past month. She takes 20 mg a day of Lasix. Her feet and legs are very swollen and uncomfortable. She has also become more SOB when walking. No chest pain or coughing. She had labs in March showing normal renal function and she had a normal gated SPECT evaluation in December showing normal systolic function. She had an elevated WBC count then to 18 K but we felt this may have been affected by the epidural steroid injection she had received jsut days before. I asked her to return in 2 weeks to repeat a CBC but she did not return. The only new medication she is taking is the Fosamax recently started by her rheumatologist, Dr. Amil Amen.    Review of Systems  Constitutional: Positive for fatigue.  Respiratory: Positive for chest tightness and shortness of breath. Negative for cough and wheezing.   Cardiovascular: Positive for leg swelling. Negative for chest pain and palpitations.  Neurological: Negative.        Objective:   Physical Exam  Constitutional:  Morbidly obese. She gets very SOB when walking down the hall. Using a cane.   Neck: No thyromegaly present.  Cardiovascular: Normal rate, regular rhythm, normal heart sounds and intact distal pulses.   No murmur heard. Pulmonary/Chest: Effort normal. No respiratory distress. She has no wheezes.  Bilateral inferior rales   Musculoskeletal:  4+ edema in both feet and lower legs to just above the knees.   Lymphadenopathy:    She has no cervical adenopathy.          Assessment & Plan:  She has recently developed generalized edema including some pulmonary edema. Increase the Lasix to 40 mg bid. Get labs today to include a BMET and a BNP. Repeat the CBC. Recheck next week.  Laurey Morale, MD

## 2015-11-24 DIAGNOSIS — M353 Polymyalgia rheumatica: Secondary | ICD-10-CM | POA: Diagnosis not present

## 2015-11-26 MED ORDER — SULFAMETHOXAZOLE-TRIMETHOPRIM 800-160 MG PO TABS: 1.0000 | ORAL_TABLET | Freq: Two times a day (BID) | ORAL | Status: DC

## 2015-11-26 NOTE — Addendum Note (Signed)
Addended by: Aggie Hacker A on: 11/26/2015 04:43 PM   Modules accepted: Orders

## 2015-11-28 ENCOUNTER — Ambulatory Visit
Admission: RE | Admit: 2015-11-28 | Discharge: 2015-11-28 | Disposition: A | Discharge status: Home / Self Care | Payer: Medicare Other | Source: Ambulatory Visit | Attending: Family Medicine | Admitting: Family Medicine

## 2015-11-28 ENCOUNTER — Other Ambulatory Visit: Payer: Self-pay | Admitting: Family Medicine

## 2015-11-28 DIAGNOSIS — M4726 Other spondylosis with radiculopathy, lumbar region: Secondary | ICD-10-CM | POA: Diagnosis not present

## 2015-11-28 DIAGNOSIS — M5136 Other intervertebral disc degeneration, lumbar region: Secondary | ICD-10-CM | POA: Diagnosis not present

## 2015-11-28 DIAGNOSIS — D0511 Intraductal carcinoma in situ of right breast: Secondary | ICD-10-CM | POA: Diagnosis not present

## 2015-11-28 DIAGNOSIS — R921 Mammographic calcification found on diagnostic imaging of breast: Secondary | ICD-10-CM | POA: Diagnosis not present

## 2015-11-28 DIAGNOSIS — R928 Other abnormal and inconclusive findings on diagnostic imaging of breast: Secondary | ICD-10-CM

## 2015-11-28 DIAGNOSIS — M4806 Spinal stenosis, lumbar region: Secondary | ICD-10-CM | POA: Diagnosis not present

## 2015-11-28 DIAGNOSIS — M4316 Spondylolisthesis, lumbar region: Secondary | ICD-10-CM | POA: Diagnosis not present

## 2015-12-02 DIAGNOSIS — H401111 Primary open-angle glaucoma, right eye, mild stage: Secondary | ICD-10-CM | POA: Diagnosis not present

## 2015-12-02 DIAGNOSIS — H401122 Primary open-angle glaucoma, left eye, moderate stage: Secondary | ICD-10-CM | POA: Diagnosis not present

## 2015-12-03 ENCOUNTER — Telehealth: Payer: Self-pay | Admitting: *Deleted

## 2015-12-03 DIAGNOSIS — C50411 Malignant neoplasm of upper-outer quadrant of right female breast: Secondary | ICD-10-CM

## 2015-12-03 NOTE — Telephone Encounter (Signed)
Confirmed [facility] for 12/10/15 at 1215pm .  Instructions and contact information given.

## 2015-12-05 ENCOUNTER — Telehealth: Payer: Self-pay | Admitting: Family Medicine

## 2015-12-05 MED ORDER — HYDROCODONE-ACETAMINOPHEN 10-325 MG PO TABS: 1.0000 | ORAL_TABLET | ORAL | Status: DC | PRN

## 2015-12-05 NOTE — Telephone Encounter (Signed)
done

## 2015-12-05 NOTE — Telephone Encounter (Signed)
Pt needs new rx hydrocodone °

## 2015-12-05 NOTE — Telephone Encounter (Signed)
Script is ready for pick up and left a message.

## 2015-12-10 ENCOUNTER — Encounter: Payer: Self-pay | Admitting: General Practice

## 2015-12-10 ENCOUNTER — Other Ambulatory Visit: Payer: Self-pay | Admitting: General Surgery

## 2015-12-10 ENCOUNTER — Ambulatory Visit
Admission: RE | Admit: 2015-12-10 | Discharge: 2015-12-10 | Disposition: A | Discharge status: Home / Self Care | Payer: Medicare Other | Source: Ambulatory Visit | Attending: Radiation Oncology | Admitting: Radiation Oncology

## 2015-12-10 ENCOUNTER — Ambulatory Visit (HOSPITAL_BASED_OUTPATIENT_CLINIC_OR_DEPARTMENT_OTHER): Payer: Medicare Other | Admitting: Hematology and Oncology

## 2015-12-10 ENCOUNTER — Other Ambulatory Visit (HOSPITAL_BASED_OUTPATIENT_CLINIC_OR_DEPARTMENT_OTHER): Payer: Medicare Other

## 2015-12-10 ENCOUNTER — Encounter: Payer: Self-pay | Admitting: Hematology and Oncology

## 2015-12-10 ENCOUNTER — Ambulatory Visit: Payer: Medicare Other | Admitting: Physical Therapy

## 2015-12-10 VITALS — BP 132/76 | HR 100 | Temp 98.4°F | Resp 19 | Ht 64.0 in | Wt 210.7 lb

## 2015-12-10 DIAGNOSIS — D0511 Intraductal carcinoma in situ of right breast: Secondary | ICD-10-CM | POA: Diagnosis not present

## 2015-12-10 DIAGNOSIS — C50411 Malignant neoplasm of upper-outer quadrant of right female breast: Secondary | ICD-10-CM | POA: Diagnosis not present

## 2015-12-10 DIAGNOSIS — I10 Essential (primary) hypertension: Secondary | ICD-10-CM | POA: Diagnosis not present

## 2015-12-10 LAB — CBC WITH DIFFERENTIAL/PLATELET
BASO%: 0.3 % (ref 0.0–2.0)
BASOS ABS: 0.1 10*3/uL (ref 0.0–0.1)
EOS ABS: 0.2 10*3/uL (ref 0.0–0.5)
EOS%: 1.5 % (ref 0.0–7.0)
HEMATOCRIT: 40.8 % (ref 34.8–46.6)
HEMOGLOBIN: 13.8 g/dL (ref 11.6–15.9)
LYMPH#: 2.1 10*3/uL (ref 0.9–3.3)
LYMPH%: 14.2 % (ref 14.0–49.7)
MCH: 34.2 pg — AB (ref 25.1–34.0)
MCHC: 33.8 g/dL (ref 31.5–36.0)
MCV: 101 fL (ref 79.5–101.0)
MONO#: 1.6 10*3/uL — ABNORMAL HIGH (ref 0.1–0.9)
MONO%: 10.8 % (ref 0.0–14.0)
NEUT%: 73.2 % (ref 38.4–76.8)
NEUTROS ABS: 11 10*3/uL — AB (ref 1.5–6.5)
Platelets: 210 10*3/uL (ref 145–400)
RBC: 4.04 10*6/uL (ref 3.70–5.45)
RDW: 12.6 % (ref 11.2–14.5)
WBC: 15 10*3/uL — AB (ref 3.9–10.3)

## 2015-12-10 LAB — COMPREHENSIVE METABOLIC PANEL
ALBUMIN: 3.8 g/dL (ref 3.5–5.0)
ALK PHOS: 78 U/L (ref 40–150)
ALT: 42 U/L (ref 0–55)
AST: 37 U/L — AB (ref 5–34)
Anion Gap: 12 mEq/L — ABNORMAL HIGH (ref 3–11)
BILIRUBIN TOTAL: 0.43 mg/dL (ref 0.20–1.20)
BUN: 17.3 mg/dL (ref 7.0–26.0)
CALCIUM: 10.4 mg/dL (ref 8.4–10.4)
CO2: 28 mEq/L (ref 22–29)
CREATININE: 1 mg/dL (ref 0.6–1.1)
Chloride: 101 mEq/L (ref 98–109)
EGFR: 55 mL/min/{1.73_m2} — ABNORMAL LOW (ref >90)
GLUCOSE: 121 mg/dL (ref 70–140)
POTASSIUM: 3.9 meq/L (ref 3.5–5.1)
Sodium: 141 mEq/L (ref 136–145)
TOTAL PROTEIN: 7.1 g/dL (ref 6.4–8.3)

## 2015-12-10 NOTE — Assessment & Plan Note (Signed)
Right breast biopsy UOQ 11/27/2015: DCIS with necrosis and calcifications, high-grade, ER 0%, PR 0%, mammogram revealed right breast calcifications 1.4 cm, Tis N0 stage 0  Pathology review: I discussed with the patient the difference between DCIS and invasive breast cancer. It is considered a precancerous lesion. DCIS is classified as a 0. It is generally detected through mammograms as calcifications. We discussed the significance of grades and its impact on prognosis. We also discussed the importance of ER and PR receptors and their implications to adjuvant treatment options. Prognosis of DCIS dependence on grade, comedo necrosis. It is anticipated that if not treated, 20-30% of DCIS can develop into invasive breast cancer.  Recommendation: 1. Genetic counseling because of family history with sister with ovarian cancer and an aunt with breast cancer 2. Breast conserving surgery 3. Followed by adjuvant radiation therapy No role of antiestrogen therapy because she is ER/PR negative.  Return to clinic after surgery to discuss the final pathology report.

## 2015-12-10 NOTE — Progress Notes (Signed)
[facility] CONSULT NOTE  Patient Care Team: Laurey Morale, MD as PCP - General Marygrace Drought, MD (Ophthalmology) Jovita Gamma, MD (Neurosurgery)  CHIEF COMPLAINTS/PURPOSE OF CONSULTATION:  Newly diagnosed breast cancer  HISTORY OF PRESENTING ILLNESS:  Connie Lang 75 y.o. female is here because of recent diagnosis of right breast DCIS. Patient had a routine screening mammogram that revealed right breast calcifications in the upper outer quadrant measuring 1.4 cm. Iridectomy biopsy was performed which revealed high-grade DCIS with necrosis that was ER/PR negative. She is here accompanied by her husband to discuss treatment options. She was presented this morning at the multidisciplinary tumor board.  I reviewed her records extensively and collaborated the history with the patient.  SUMMARY OF ONCOLOGIC HISTORY:   Breast cancer of upper-outer quadrant of right female breast ([place])   11/28/2015 Initial Diagnosis Right breast biopsy UOQ: DCIS with necrosis and calcifications, high-grade, ER 0%, PR 0%, mammogram revealed right breast calcifications 1.4 cm, Tis N0 stage 0    In terms of breast cancer risk profile:  She menarched at early age of 41 and went to menopause at age 66  She had 4 pregnancy, her first child was born at age 56  She has received birth control pills for approximately 4 years.  She was exposed to hormone replacement therapy for 4 years.  She has  family history of Breast/GYN/GI cancer  Maternal niece brain tumor Paternal aunt breast cancer Sister ovarian cancer in 68s Brother colon cancer in 84s   MEDICAL HISTORY:  Past Medical History  Diagnosis Date  . Transfusion history     blood   . Hyperlipemia   . Hypertension   . Osteoarthritis   . Glaucoma     glaucoma and cataracts sees Dr Satira Sark   . Cancer ([facility]) 1987    cancerous cells in vaginal wall seemed to be metastatic from the gut but no primary was ever found   . Chicken pox    . DJD (degenerative joint disease)   . GERD (gastroesophageal reflux disease)   . PMR (polymyalgia rheumatica) ([facility])     sees Dr. Leigh Aurora   . Lumbar disc disease     sees Dr. Sherwood Gambler   . Breast cancer ([place])   . Depression   . Anxiety     SURGICAL HISTORY: Past Surgical History  Procedure Laterality Date  . Breast biopsy       x 4  . Appendectomy    . Abdominal hysterectomy      TAH BSO 1985  . Cesarean section    . Tendon repair      rt wrist  . Basal cell carcinoma excision      nose and rt thigh  . Refractive surgery      01-29-2009 04-07-2009 Dr Foye Clock in [place]  . Cervical laminectomy      1983  . Myelogram    . Cervical microdiskectomy      1990 Dr Jovita Gamma  . Lumbar laminectomy      1990 Dr Jovita Gamma  . Colonoscopy  07-06-07    clear, repeat in 10 yrs   . Foot arthrodesis, modified mcbride      SOCIAL HISTORY: Social History   Social History  . Marital Status: Unknown    Spouse Name: N/A  . Number of Children: N/A  . Years of Education: N/A   Occupational History  . Not on file.   Social History Main Topics  .  Smoking status: Former Smoker    Quit date: 07/06/1995  . Smokeless tobacco: Never Used  . Alcohol Use: 4.2 oz/week    7 Standard drinks or equivalent per week     Comment: 1-2 glasses of wine each day  . Drug Use: No  . Sexual Activity: Not on file   Other Topics Concern  . Not on file   Social History Narrative    FAMILY HISTORY: Family History  Problem Relation Age of Onset  . Cancer Sister     ovarian  . Alzheimer's disease Father   . Alzheimer's disease Paternal Aunt   . Breast cancer Paternal Aunt   . Colon cancer Brother     ALLERGIES:  has No Known Allergies.  MEDICATIONS:  Current Outpatient Prescriptions  Medication Sig Dispense Refill  . alendronate (FOSAMAX) 70 MG tablet Take 70 mg by mouth once a week. Take with a full glass of water on an empty stomach.    Marland Kitchen amLODipine (NORVASC) 10 MG  tablet Take 1 tablet (10 mg total) by mouth daily. 90 tablet 3  . aspirin 81 MG tablet Take 81 mg by mouth daily. Reported on 11/21/2015    . chlorpheniramine (CHLOR-TRIMETON) 4 MG tablet Take 4 mg by mouth 2 (two) times daily as needed.      . cyclobenzaprine (FLEXERIL) 10 MG tablet TAKE 1 TABLET BY MOUTH 3 TIMES DAILY AS NEEDED FOR MUSCLE SPASMS. *PRIOR AUTH* 270 tablet 1  . furosemide (LASIX) 40 MG tablet Take 1 tablet (40 mg total) by mouth 2 (two) times daily. 60 tablet 3  . HYDROcodone-acetaminophen (NORCO) 10-325 MG tablet Take 1 tablet by mouth every 4 (four) hours as needed for moderate pain. 150 tablet 0  . ketoconazole (NIZORAL) 2 % cream Apply 1 application topically 2 (two) times daily. 30 g 2  . lisinopril (PRINIVIL,ZESTRIL) 10 MG tablet Take 1 tablet (10 mg total) by mouth daily. 90 tablet 3  . NON FORMULARY AREDS 2 Take 2 PO QD    . Omega-3 Fatty Acids (OMEGA 3 PO) Take by mouth.    . pantoprazole (PROTONIX) 40 MG tablet Take 1 tablet (40 mg total) by mouth daily. 90 tablet 3  . potassium chloride (KLOR-CON 10) 10 MEQ tablet Take 2 tablets by mouth daily 180 tablet 3  . PREDNISONE PO Take 5 mg by mouth daily.     Marland Kitchen Propylene Glycol (SYSTANE BALANCE OP) Apply to eye as needed.      . simvastatin (ZOCOR) 20 MG tablet Take 1 tablet (20 mg total) by mouth at bedtime. 90 tablet 3  . sulfamethoxazole-trimethoprim (BACTRIM DS,SEPTRA DS) 800-160 MG tablet Take 1 tablet by mouth 2 (two) times daily. 14 tablet 0  . timolol (TIMOPTIC-XR) 0.5 % ophthalmic gel-forming   3  . travoprost, benzalkonium, (TRAVATAN) 0.004 % ophthalmic solution Place 1 drop into both eyes at bedtime.      . [DISCONTINUED] Calcium Carbonate-Vit D-Min 1200-1000 MG-UNIT CHEW Chew 1 tablet by mouth daily.       No current facility-administered medications for this visit.    REVIEW OF SYSTEMS:   Constitutional: Denies fevers, chills or abnormal night sweats Eyes: Denies blurriness of vision, double vision or watery  eyes Ears, nose, mouth, throat, and face: Denies mucositis or sore throat Respiratory: Denies cough, dyspnea or wheezes Cardiovascular: Denies palpitation, chest discomfort or lower extremity swelling Gastrointestinal:  Denies nausea, heartburn or change in bowel habits Skin: Denies abnormal skin rashes Lymphatics: Denies new lymphadenopathy or easy bruising  Neurological:Denies numbness, tingling or new weaknesses Behavioral/Psych: Mood is stable, no new changes  Breast:  Denies any palpable lumps or discharge All other systems were reviewed with the patient and are negative.  PHYSICAL EXAMINATION: ECOG PERFORMANCE STATUS: 1 - Symptomatic but completely ambulatory  Filed Vitals:   12/10/15 1249  BP: 132/76  Pulse: 100  Temp: 98.4 F (36.9 C)  Resp: 19   Filed Weights   12/10/15 1249  Weight: 210 lb 11.2 oz (95.573 kg)    GENERAL:alert, no distress and comfortable SKIN: skin color, texture, turgor are normal, no rashes or significant lesions EYES: normal, conjunctiva are pink and non-injected, sclera clear OROPHARYNX:no exudate, no erythema and lips, buccal mucosa, and tongue normal  NECK: supple, thyroid normal size, non-tender, without nodularity LYMPH:  no palpable lymphadenopathy in the cervical, axillary or inguinal LUNGS: clear to auscultation and percussion with normal breathing effort HEART: regular rate & rhythm and no murmurs and no lower extremity edema ABDOMEN:abdomen soft, non-tender and normal bowel sounds Musculoskeletal:no cyanosis of digits and no clubbing  PSYCH: alert & oriented x 3 with fluent speech NEURO: no focal motor/sensory deficits BREAST: No palpable nodules in breast. No palpable axillary or supraclavicular lymphadenopathy (exam performed in the presence of a chaperone)   LABORATORY DATA:  I have reviewed the data as listed Lab Results  Component Value Date   WBC 15.0* 12/10/2015   HGB 13.8 12/10/2015   HCT 40.8 12/10/2015   MCV 101.0  12/10/2015   PLT 210 12/10/2015   Lab Results  Component Value Date   NA 141 12/10/2015   K 3.9 12/10/2015   CL 102 11/21/2015   CO2 28 12/10/2015    RADIOGRAPHIC STUDIES: I have personally reviewed the radiological reports and agreed with the findings in the report.  ASSESSMENT AND PLAN:  Breast cancer of upper-outer quadrant of right female breast ([place]) Right breast biopsy UOQ 11/27/2015: DCIS with necrosis and calcifications, high-grade, ER 0%, PR 0%, mammogram revealed right breast calcifications 1.4 cm, Tis N0 stage 0  Pathology review: I discussed with the patient the difference between DCIS and invasive breast cancer. It is considered a precancerous lesion. DCIS is classified as a 0. It is generally detected through mammograms as calcifications. We discussed the significance of grades and its impact on prognosis. We also discussed the importance of ER and PR receptors and their implications to adjuvant treatment options. Prognosis of DCIS dependence on grade, comedo necrosis. It is anticipated that if not treated, 20-30% of DCIS can develop into invasive breast cancer.  Recommendation: 1. Genetic counseling because of family history with sister with ovarian cancer and an aunt with breast cancer 2. Breast conserving surgery 3. Followed by adjuvant radiation therapy No role of antiestrogen therapy because she is ER/PR negative.  Return to clinic after surgery to discuss the final pathology report.  All questions were answered. The patient knows to call the clinic with any problems, questions or concerns.    Rulon Eisenmenger, MD 12/10/2015

## 2015-12-10 NOTE — Addendum Note (Signed)
Addended by: Cheree Ditto on: 12/10/2015 04:37 PM   Modules accepted: Orders, Medications

## 2015-12-12 NOTE — Progress Notes (Signed)
[facility] Psychosocial Distress Screening Spiritual Care  Accompanied by counseling intern Aviry Reich/LPCA, met with Ms Felan and her husband at [facility] to introduce [facility] team/resources, reviewing distress screen per protocol.  The patient scored a 0 on the Psychosocial Distress Thermometer which indicates mild distress. Also assessed for distress and other psychosocial needs.   ONCBCN DISTRESS SCREENING 12/12/2015  Screening Type Initial Screening  Distress experienced in past week (1-10) 0  Information Concerns Type Lack of info about treatment  Physical Problem type Pain;Nausea/vomiting;Getting around;Bathing/dressing;Breathing;Mouth sores/swallowing;Constipation/diarrhea;Skin dry/itchy;Swollen arms/legs  Referral to support programs Yes  Other Spiritual Care, counseling intern   Per pt, she is not experiencing distress because of her past experience with breast cancer and hx regular mammography.  She is using perspective and experience to cope, stating that dx is less stressful than many past challenges.  Couple has four children and hx in [employer], which lend joy and perspective to life.  Per pt, her biggest stressors are pain, getting around, and bathing/dressing.  Couple reports no other needs at this time.  Follow up needed: No.  Family is aware of ongoing [facility] programming/team availability for support, but please page if needs arise/circumstances change.  Thank you.  [place], [place], [facility] Pager [phone] Voicemail [phone]

## 2015-12-16 ENCOUNTER — Other Ambulatory Visit: Payer: Self-pay | Admitting: General Surgery

## 2015-12-16 ENCOUNTER — Telehealth: Payer: Self-pay | Admitting: *Deleted

## 2015-12-16 DIAGNOSIS — C50411 Malignant neoplasm of upper-outer quadrant of right female breast: Secondary | ICD-10-CM

## 2015-12-16 NOTE — Telephone Encounter (Signed)
Spoke to pt concerning [place] from 12/10/15. Denies questions regarding dx or treatment care plan.  Pt wishes to move genetic appt out to July.  POF placed for Dr. Lindi Adie appt and to move genetics appt.  Referral placed for appt with Dr. Lisbeth Renshaw

## 2015-12-17 ENCOUNTER — Encounter: Payer: Self-pay | Admitting: Radiation Oncology

## 2015-12-17 ENCOUNTER — Encounter (HOSPITAL_BASED_OUTPATIENT_CLINIC_OR_DEPARTMENT_OTHER): Payer: Self-pay | Admitting: *Deleted

## 2015-12-17 NOTE — Progress Notes (Signed)
Radiation Oncology         [phone] ________________________________  Name: Connie Lang MRN: [id number]  Date: 12/10/2015  DOB: 1940-07-10  CC:FRY,STEPHEN A, MD  Rolm Bookbinder, MD     REFERRING PHYSICIAN: Rolm Bookbinder, MD   DIAGNOSIS: The encounter diagnosis was Breast cancer of upper-outer quadrant of right female breast [facility]).  Breast cancer of upper-outer quadrant of right female breast [facility])   Staging form: Breast, AJCC 7th Edition     Clinical stage from 12/10/2015: Stage 0 (Tis (DCIS), N0, M0) - Unsigned    HISTORY OF PRESENT ILLNESS::Connie Lang is a 75 y.o. female who is seen for an initial consultation visit regarding the patient's diagnosis of DCIS of the right breast.  The patient presented was originally with suspicious calcifications on screening mammogram. A diagnostic mammogram confirmed this finding and a stereotactic biopsy of this area was performed. The area in question measures 1.4 cm. This returned positive for DCIS corresponding to a grade 3 tumor. Receptors studies have been performed and the tumor is estrogen receptor negative and progesterone receptor negative.  The patient has not undergone an MRI scan of the breasts.    PREVIOUS RADIATION THERAPY: No   PAST MEDICAL HISTORY:  has a past medical history of Transfusion history; Hyperlipemia; Hypertension; Osteoarthritis; Glaucoma; Cancer ([place]) (1987); Chicken pox; DJD (degenerative joint disease); GERD (gastroesophageal reflux disease); PMR (polymyalgia rheumatica) ([place]); Lumbar disc disease; Breast cancer ([place]); Depression; and Anxiety.     PAST SURGICAL HISTORY: Past Surgical History  Procedure Laterality Date  . Breast biopsy       x 4  . Appendectomy    . Abdominal hysterectomy      TAH BSO 1985  . Cesarean section    . Tendon repair      rt wrist  . Basal cell carcinoma excision      nose and rt thigh  . Refractive surgery      01-29-2009 04-07-2009 Dr Foye Clock  in [place]  . Cervical laminectomy      1983  . Myelogram    . Cervical microdiskectomy      1990 Dr Jovita Gamma  . Lumbar laminectomy      1990 Dr Jovita Gamma  . Colonoscopy  07-06-07    clear, repeat in 10 yrs   . Foot arthrodesis, modified mcbride       FAMILY HISTORY: family history includes Alzheimer's disease in her father and paternal aunt; Breast cancer in her paternal aunt; Cancer in her sister; Colon cancer in her brother.   SOCIAL HISTORY:  reports that she quit smoking about 20 years ago. She has never used smokeless tobacco. She reports that she drinks about 4.2 oz of alcohol per week. She reports that she does not use illicit drugs.   ALLERGIES: Review of patient's allergies indicates no known allergies.   MEDICATIONS:  Current Outpatient Prescriptions  Medication Sig Dispense Refill  . alendronate (FOSAMAX) 70 MG tablet Take 70 mg by mouth once a week. Take with a full glass of water on an empty stomach.    Marland Kitchen amLODipine (NORVASC) 5 MG tablet Take 5 mg by mouth daily.    . Calcium Carb-Cholecalciferol (CALCIUM 600+D3) 600-800 MG-UNIT TABS Take by mouth 2 (two) times daily.    . chlorpheniramine (CHLOR-TRIMETON) 4 MG tablet Take 4 mg by mouth 2 (two) times daily as needed.      . cyclobenzaprine (FLEXERIL) 10 MG tablet TAKE 1 TABLET BY MOUTH 3 TIMES  DAILY AS NEEDED FOR MUSCLE SPASMS. *PRIOR AUTH* 270 tablet 1  . dextromethorphan-guaiFENesin (ROBITUSSIN-DM) 10-100 MG/5ML liquid Take by mouth as needed for cough.    . diphenhydrAMINE (BENADRYL) 25 mg capsule Take 25 mg by mouth as needed.    . diphenhydrAMINE-zinc acetate (BENADRYL) cream Apply 1 application topically as needed for itching.    . Fish Oil-Cholecalciferol (FISH OIL + D3 PO) Take by mouth 2 (two) times daily.    . furosemide (LASIX) 40 MG tablet Take 1 tablet (40 mg total) by mouth 2 (two) times daily. 60 tablet 3  . HYDROcodone-acetaminophen (NORCO) 10-325 MG tablet Take 1 tablet by mouth every 4  (four) hours as needed for moderate pain. 150 tablet 0  . Misc. Devices (NASAL SPRAY BOTTLE) MISC by Does not apply route as needed.    . NON FORMULARY AREDS 2 Take 2 PO QD    . pantoprazole (PROTONIX) 40 MG tablet Take 1 tablet (40 mg total) by mouth daily. 90 tablet 3  . potassium chloride (KLOR-CON 10) 10 MEQ tablet Take 2 tablets by mouth daily 180 tablet 3  . predniSONE (DELTASONE) 5 MG tablet   0  . Propylene Glycol (SYSTANE BALANCE OP) Apply to eye as needed.      . simvastatin (ZOCOR) 20 MG tablet Take 1 tablet (20 mg total) by mouth at bedtime. 90 tablet 3  . sulfamethoxazole-trimethoprim (BACTRIM DS,SEPTRA DS) 800-160 MG tablet Take 1 tablet by mouth 2 (two) times daily. 14 tablet 0  . timolol (TIMOPTIC-XR) 0.5 % ophthalmic gel-forming   3  . TRAVATAN Z 0.004 % SOLN ophthalmic solution   3  . vitamin B-12 (CYANOCOBALAMIN) 1000 MCG tablet Take 1,000 mcg by mouth daily.    . [DISCONTINUED] Calcium Carbonate-Vit D-Min 1200-1000 MG-UNIT CHEW Chew 1 tablet by mouth daily.       No current facility-administered medications for this encounter.     REVIEW OF SYSTEMS:  A 15 point review of systems is documented in the electronic medical record. This was obtained by the nursing staff. However, I reviewed this with the patient to discuss relevant findings and make appropriate changes.  Pertinent items are noted in HPI.    PHYSICAL EXAM:  vitals were not taken for this visit.  ECOG = 1  0 - Asymptomatic (Fully active, able to carry on all predisease activities without restriction)  1 - Symptomatic but completely ambulatory (Restricted in physically strenuous activity but ambulatory and able to carry out work of a light or sedentary nature. For example, light housework, office work)  2 - Symptomatic, <50% in bed during the day (Ambulatory and capable of all self care but unable to carry out any work activities. Up and about more than 50% of waking hours)  3 - Symptomatic, >50% in bed, but  not bedbound (Capable of only limited self-care, confined to bed or chair 50% or more of waking hours)  4 - Bedbound (Completely disabled. Cannot carry on any self-care. Totally confined to bed or chair)  5 - Death   Eustace Pen MM, Creech RH, Tormey DC, et al. [phone]). "Toxicity and response criteria of the [facility]". [place] Oncol. 5 (6): 649-55  General: Well-developed, in no acute distress HEENT: Normocephalic, atraumatic; oral cavity clear     LABORATORY DATA:  Lab Results  Component Value Date   WBC 15.0* 12/10/2015   HGB 13.8 12/10/2015   HCT 40.8 12/10/2015   MCV 101.0 12/10/2015   PLT 210 12/10/2015  Lab Results  Component Value Date   NA 141 12/10/2015   K 3.9 12/10/2015   CL 102 11/21/2015   CO2 28 12/10/2015   Lab Results  Component Value Date   ALT 42 12/10/2015   AST 37* 12/10/2015   ALKPHOS 78 12/10/2015   BILITOT 0.43 12/10/2015      RADIOGRAPHY: Mm Digital Diagnostic Unilat R  11/28/2015  CLINICAL DATA:  Status post stereotactic biopsy of right breast calcifications. EXAM: DIAGNOSTIC RIGHT MAMMOGRAM POST STEREOTACTIC BIOPSY COMPARISON:  Previous exam(s). FINDINGS: Mammographic images were obtained following stereotactic guided biopsy of calcifications in the upper-outer quadrant of the right breast. Mammographic images show there is a coil shaped clip in appropriate position in the upper-outer quadrant of the right breast. IMPRESSION: Status post stereotactic biopsy right breast calcifications with pathology pending. Final Assessment: Post Procedure Mammograms for Marker Placement Electronically Signed   By: Lillia Mountain M.D.   On: 11/28/2015 11:26   Mm Digital Diagnostic Unilat R  11/19/2015  CLINICAL DATA:  75 year old female presenting for screening recall of right breast calcifications. EXAM: DIGITAL DIAGNOSTIC RIGHT MAMMOGRAM WITH CAD COMPARISON:  Previous exam(s). ACR Breast Density Category c: The breast tissue is  heterogeneously dense, which may obscure small masses. FINDINGS: In the upper-outer quadrant of the right breast in the anterior depth there is a group of linearly oriented dot and dash calcifications spanning approximately 1.4 cm. Mammographic images were processed with CAD. IMPRESSION: Suspicious linear branching calcifications in the upper-outer quadrant of the right breast. RECOMMENDATION: Stereotactic biopsy of the calcifications in the upper-outer quadrant of the right breast is recommended. Scheduled for 11/28/2015 at 10 a.m. I have discussed the findings and recommendations with the patient. Results were also provided in writing at the conclusion of the visit. If applicable, a reminder letter will be sent to the patient regarding the next appointment. BI-RADS CATEGORY  4: Suspicious abnormality - biopsy should be considered. Electronically Signed   By: Ammie Ferrier M.D.   On: 11/19/2015 16:52   Mm Rt Breast Bx W Loc Dev 1st Lesion Image Bx Spec Stereo Guide  12/03/2015  ADDENDUM REPORT: 12/02/2015 14:44 ADDENDUM: Pathology revealed HIGH GRADE DUCTAL CARCINOMA IN SITU WITH NECROSIS AND ASSOCIATED CALCIFICATION of the upper outer quadrant of the Right breast. This was found to be concordant by Dr. Lillia Mountain. Pathology results were discussed with the patient by telephone. The patient reported doing well after the biopsy with tenderness at the site. Post biopsy instructions and care were reviewed and questions were answered. The patient was encouraged to call [facility] for any additional concerns. The patient was referred to [facility] on December 10, 2015. Pathology results reported by Terie Purser, RN on 12/02/2015. Electronically Signed   By: Lillia Mountain M.D.   On: 12/02/2015 14:44  12/03/2015  CLINICAL DATA:  Right breast calcifications. EXAM: RIGHT BREAST STEREOTACTIC CORE NEEDLE BIOPSY COMPARISON:   Previous exams. FINDINGS: The patient and I discussed the procedure of stereotactic-guided biopsy including benefits and alternatives. We discussed the high likelihood of a successful procedure. We discussed the risks of the procedure including infection, bleeding, tissue injury, clip migration, and inadequate sampling. Informed written consent was given. The usual time out protocol was performed immediately prior to the procedure. Using sterile technique and 1% lidocaine and 1% lidocaine with epinephrine as local anesthetic, under stereotactic guidance, a 9 gauge vacuum assisted device was used to perform core  needle biopsy of calcifications in the upper-outer quadrant of the right breast using a superior to inferior approach. Specimen radiograph was performed showing calcifications are present in the tissue samples. Specimens with calcifications are identified for pathology. At the conclusion of the procedure, a coil shaped tissue marker clip was deployed into the biopsy cavity. Follow-up 2-view mammogram was performed and dictated separately. IMPRESSION: Stereotactic-guided biopsy of the right breast. No apparent complications. Electronically Signed: By: Lillia Mountain M.D. On: 11/28/2015 11:25       IMPRESSION:    Breast cancer of upper-outer quadrant of right female breast ([place])   11/28/2015 Initial Diagnosis Right breast biopsy UOQ: DCIS with necrosis and calcifications, high-grade, ER 0%, PR 0%, mammogram revealed right breast calcifications 1.4 cm, Tis N0 stage 0    The patient has a recent diagnosis of DCIS of the Right breast. She appears to be a good candidate for breast conservation treatment.  I discussed with the patient the role of adjuvant radiation treatment in this setting. We discussed the potential benefit of radiation treatment, especially with regards to local control of the patient's tumor. We also discussed the possible side effects and risks of such a treatment as well. Given the  fact that the patient has a grade 3 DCIS with necrosis, which is receptor negative, this places her at increased risk of local failure and I would recommend adjuvant radiation treatment in this setting.  All of the patient's questions were answered. The patient wishes to proceed with radiation treatment at the appropriate time.  PLAN: I look forward to seeing the patient postoperatively to review her case and further discuss and coordinate an anticipated course of radiation treatment.  The patient would be a very good candidate I believe for a hypo-fractionated four-week course of radiation treatment.     ________________________________   Jodelle Gross, MD, PhD   **Disclaimer: This note was dictated with voice recognition software. Similar sounding words can inadvertently be transcribed and this note may contain transcription errors which may not have been corrected upon publication of note.**

## 2015-12-18 ENCOUNTER — Telehealth: Payer: Self-pay | Admitting: Hematology and Oncology

## 2015-12-18 NOTE — Telephone Encounter (Signed)
appt made and r/s per 6/14 pof. Letter sent to patient by mail

## 2015-12-23 ENCOUNTER — Ambulatory Visit
Admission: RE | Admit: 2015-12-23 | Discharge: 2015-12-23 | Disposition: A | Discharge status: Home / Self Care | Payer: Medicare Other | Source: Ambulatory Visit | Attending: General Surgery | Admitting: General Surgery

## 2015-12-23 DIAGNOSIS — D0511 Intraductal carcinoma in situ of right breast: Secondary | ICD-10-CM | POA: Diagnosis not present

## 2015-12-23 DIAGNOSIS — C50411 Malignant neoplasm of upper-outer quadrant of right female breast: Secondary | ICD-10-CM

## 2015-12-23 NOTE — Progress Notes (Signed)
Pt given 8 oz carton of boost breeze with written and verbal instructions to drink by 850 morning of surgery. Also this is the only liquid after midnight she is to drink  Teach back and pt voiced understanding

## 2015-12-24 ENCOUNTER — Other Ambulatory Visit: Payer: Medicare Other

## 2015-12-24 ENCOUNTER — Encounter: Payer: Medicare Other | Admitting: Genetic Counselor

## 2015-12-25 ENCOUNTER — Ambulatory Visit
Admission: RE | Admit: 2015-12-25 | Discharge: 2015-12-25 | Disposition: A | Discharge status: Home / Self Care | Payer: Medicare Other | Source: Ambulatory Visit | Attending: General Surgery | Admitting: General Surgery

## 2015-12-25 ENCOUNTER — Ambulatory Visit (HOSPITAL_BASED_OUTPATIENT_CLINIC_OR_DEPARTMENT_OTHER): Payer: Medicare Other | Admitting: Certified Registered"

## 2015-12-25 ENCOUNTER — Ambulatory Visit (HOSPITAL_BASED_OUTPATIENT_CLINIC_OR_DEPARTMENT_OTHER)
Admission: RE | Admit: 2015-12-25 | Discharge: 2015-12-25 | Disposition: A | Discharge status: Home / Self Care | Payer: Medicare Other | Source: Ambulatory Visit | Attending: General Surgery | Admitting: General Surgery

## 2015-12-25 ENCOUNTER — Encounter (HOSPITAL_BASED_OUTPATIENT_CLINIC_OR_DEPARTMENT_OTHER)
Admission: RE | Disposition: A | Discharge status: Home / Self Care | Payer: Self-pay | Source: Ambulatory Visit | Attending: General Surgery

## 2015-12-25 ENCOUNTER — Encounter (HOSPITAL_BASED_OUTPATIENT_CLINIC_OR_DEPARTMENT_OTHER): Payer: Self-pay | Admitting: Certified Registered"

## 2015-12-25 DIAGNOSIS — F419 Anxiety disorder, unspecified: Secondary | ICD-10-CM | POA: Diagnosis not present

## 2015-12-25 DIAGNOSIS — M1990 Unspecified osteoarthritis, unspecified site: Secondary | ICD-10-CM | POA: Diagnosis not present

## 2015-12-25 DIAGNOSIS — C50411 Malignant neoplasm of upper-outer quadrant of right female breast: Secondary | ICD-10-CM

## 2015-12-25 DIAGNOSIS — K219 Gastro-esophageal reflux disease without esophagitis: Secondary | ICD-10-CM | POA: Diagnosis not present

## 2015-12-25 DIAGNOSIS — Z85038 Personal history of other malignant neoplasm of large intestine: Secondary | ICD-10-CM | POA: Diagnosis not present

## 2015-12-25 DIAGNOSIS — Z87891 Personal history of nicotine dependence: Secondary | ICD-10-CM | POA: Insufficient documentation

## 2015-12-25 DIAGNOSIS — E7800 Pure hypercholesterolemia, unspecified: Secondary | ICD-10-CM | POA: Insufficient documentation

## 2015-12-25 DIAGNOSIS — D0511 Intraductal carcinoma in situ of right breast: Secondary | ICD-10-CM | POA: Diagnosis not present

## 2015-12-25 DIAGNOSIS — F329 Major depressive disorder, single episode, unspecified: Secondary | ICD-10-CM | POA: Diagnosis not present

## 2015-12-25 DIAGNOSIS — I10 Essential (primary) hypertension: Secondary | ICD-10-CM | POA: Insufficient documentation

## 2015-12-25 DIAGNOSIS — R928 Other abnormal and inconclusive findings on diagnostic imaging of breast: Secondary | ICD-10-CM | POA: Diagnosis not present

## 2015-12-25 HISTORY — PX: BREAST LUMPECTOMY: SHX2

## 2015-12-25 HISTORY — PX: BREAST LUMPECTOMY WITH RADIOACTIVE SEED LOCALIZATION: SHX6424

## 2015-12-25 SURGERY — BREAST LUMPECTOMY WITH RADIOACTIVE SEED LOCALIZATION
Anesthesia: General | Site: Breast | Laterality: Right

## 2015-12-25 MED ORDER — HYDROMORPHONE HCL 1 MG/ML IJ SOLN
INTRAMUSCULAR | Status: AC
  Filled 2015-12-25: qty 1

## 2015-12-25 MED ORDER — CEFAZOLIN SODIUM-DEXTROSE 2-4 GM/100ML-% IV SOLN
INTRAVENOUS | Status: AC
  Filled 2015-12-25: qty 100

## 2015-12-25 MED ORDER — BUPIVACAINE HCL (PF) 0.25 % IJ SOLN
INTRAMUSCULAR | Status: DC | PRN
  Administered 2015-12-25: 10 mL

## 2015-12-25 MED ORDER — FENTANYL CITRATE (PF) 100 MCG/2ML IJ SOLN
INTRAMUSCULAR | Status: AC
  Filled 2015-12-25: qty 2

## 2015-12-25 MED ORDER — DEXAMETHASONE SODIUM PHOSPHATE 10 MG/ML IJ SOLN
INTRAMUSCULAR | Status: AC
  Filled 2015-12-25: qty 1

## 2015-12-25 MED ORDER — GLYCOPYRROLATE 0.2 MG/ML IJ SOLN: 0.2000 mg | Freq: Once | INTRAMUSCULAR | Status: DC | PRN

## 2015-12-25 MED ORDER — GABAPENTIN 300 MG PO CAPS
300.0000 mg | ORAL_CAPSULE | ORAL | Status: AC
  Administered 2015-12-25: 300 mg via ORAL

## 2015-12-25 MED ORDER — OXYCODONE HCL 5 MG/5ML PO SOLN: 5.0000 mg | Freq: Once | ORAL | Status: DC | PRN

## 2015-12-25 MED ORDER — HYDROCODONE-ACETAMINOPHEN 10-325 MG PO TABS: 1.0000 | ORAL_TABLET | Freq: Four times a day (QID) | ORAL | Status: DC | PRN

## 2015-12-25 MED ORDER — ACETAMINOPHEN 500 MG PO TABS
ORAL_TABLET | ORAL | Status: AC
  Filled 2015-12-25: qty 2

## 2015-12-25 MED ORDER — HYDROMORPHONE HCL 1 MG/ML IJ SOLN
0.2500 mg | INTRAMUSCULAR | Status: DC | PRN
  Administered 2015-12-25: 0.25 mg via INTRAVENOUS
  Administered 2015-12-25 (×2): 0.5 mg via INTRAVENOUS
  Administered 2015-12-25: 0.25 mg via INTRAVENOUS

## 2015-12-25 MED ORDER — DEXAMETHASONE SODIUM PHOSPHATE 4 MG/ML IJ SOLN
INTRAMUSCULAR | Status: DC | PRN
  Administered 2015-12-25: 8 mg via INTRAVENOUS

## 2015-12-25 MED ORDER — GABAPENTIN 300 MG PO CAPS
ORAL_CAPSULE | ORAL | Status: AC
  Filled 2015-12-25: qty 1

## 2015-12-25 MED ORDER — ONDANSETRON HCL 4 MG/2ML IJ SOLN: 4.0000 mg | Freq: Four times a day (QID) | INTRAMUSCULAR | Status: DC | PRN

## 2015-12-25 MED ORDER — ONDANSETRON HCL 4 MG/2ML IJ SOLN
INTRAMUSCULAR | Status: AC
  Filled 2015-12-25: qty 2

## 2015-12-25 MED ORDER — FENTANYL CITRATE (PF) 100 MCG/2ML IJ SOLN
50.0000 ug | INTRAMUSCULAR | Status: DC | PRN
  Administered 2015-12-25: 50 ug via INTRAVENOUS

## 2015-12-25 MED ORDER — OXYCODONE HCL 5 MG PO TABS: 5.0000 mg | ORAL_TABLET | Freq: Once | ORAL | Status: DC | PRN

## 2015-12-25 MED ORDER — LACTATED RINGERS IV SOLN
INTRAVENOUS | Status: DC
  Administered 2015-12-25 (×2): via INTRAVENOUS

## 2015-12-25 MED ORDER — ACETAMINOPHEN 500 MG PO TABS
1000.0000 mg | ORAL_TABLET | ORAL | Status: AC
  Administered 2015-12-25: 1000 mg via ORAL

## 2015-12-25 MED ORDER — MIDAZOLAM HCL 2 MG/2ML IJ SOLN: 1.0000 mg | INTRAMUSCULAR | Status: DC | PRN

## 2015-12-25 MED ORDER — SCOPOLAMINE 1 MG/3DAYS TD PT72: 1.0000 | MEDICATED_PATCH | Freq: Once | TRANSDERMAL | Status: DC | PRN

## 2015-12-25 MED ORDER — LIDOCAINE 2% (20 MG/ML) 5 ML SYRINGE
INTRAMUSCULAR | Status: AC
  Filled 2015-12-25: qty 5

## 2015-12-25 MED ORDER — CEFAZOLIN SODIUM-DEXTROSE 2-4 GM/100ML-% IV SOLN
2.0000 g | INTRAVENOUS | Status: AC
  Administered 2015-12-25: 2 g via INTRAVENOUS

## 2015-12-25 MED ORDER — ONDANSETRON HCL 4 MG/2ML IJ SOLN
INTRAMUSCULAR | Status: DC | PRN
  Administered 2015-12-25: 4 mg via INTRAVENOUS

## 2015-12-25 MED ORDER — LIDOCAINE HCL (CARDIAC) 20 MG/ML IV SOLN
INTRAVENOUS | Status: DC | PRN
  Administered 2015-12-25: 30 mg via INTRAVENOUS

## 2015-12-25 MED ORDER — EPHEDRINE SULFATE 50 MG/ML IJ SOLN
INTRAMUSCULAR | Status: DC | PRN
  Administered 2015-12-25 (×2): 10 mg via INTRAVENOUS

## 2015-12-25 SURGICAL SUPPLY — 57 items
APPLIER CLIP 9.375 MED OPEN (MISCELLANEOUS)
APR CLP MED 9.3 20 MLT OPN (MISCELLANEOUS)
BINDER BREAST LRG (GAUZE/BANDAGES/DRESSINGS) IMPLANT
BINDER BREAST MEDIUM (GAUZE/BANDAGES/DRESSINGS) IMPLANT
BINDER BREAST XLRG (GAUZE/BANDAGES/DRESSINGS) IMPLANT
BINDER BREAST XXLRG (GAUZE/BANDAGES/DRESSINGS) ×1 IMPLANT
BLADE SURG 15 STRL LF DISP TIS (BLADE) ×1 IMPLANT
BLADE SURG 15 STRL SS (BLADE) ×2
CANISTER SUC SOCK COL 7IN (MISCELLANEOUS) IMPLANT
CANISTER SUCT 1200ML W/VALVE (MISCELLANEOUS) IMPLANT
CHLORAPREP W/TINT 26ML (MISCELLANEOUS) ×2 IMPLANT
CLIP APPLIE 9.375 MED OPEN (MISCELLANEOUS) IMPLANT
COVER BACK TABLE 60X90IN (DRAPES) ×2 IMPLANT
COVER MAYO STAND STRL (DRAPES) ×2 IMPLANT
COVER PROBE W GEL 5X96 (DRAPES) ×2 IMPLANT
DECANTER SPIKE VIAL GLASS SM (MISCELLANEOUS) IMPLANT
DEVICE DUBIN W/COMP PLATE 8390 (MISCELLANEOUS) ×2 IMPLANT
DRAPE LAPAROSCOPIC ABDOMINAL (DRAPES) ×2 IMPLANT
DRAPE UTILITY XL STRL (DRAPES) ×2 IMPLANT
DRSG TEGADERM 4X4.75 (GAUZE/BANDAGES/DRESSINGS) IMPLANT
ELECT COATED BLADE 2.86 ST (ELECTRODE) ×2 IMPLANT
ELECT REM PT RETURN 9FT ADLT (ELECTROSURGICAL) ×2
ELECTRODE REM PT RTRN 9FT ADLT (ELECTROSURGICAL) ×1 IMPLANT
GLOVE BIO SURGEON STRL SZ7 (GLOVE) ×4 IMPLANT
GLOVE BIOGEL PI IND STRL 7.0 (GLOVE) IMPLANT
GLOVE BIOGEL PI IND STRL 7.5 (GLOVE) ×1 IMPLANT
GLOVE BIOGEL PI INDICATOR 7.0 (GLOVE) ×1
GLOVE BIOGEL PI INDICATOR 7.5 (GLOVE) ×2
GLOVE ECLIPSE 6.5 STRL STRAW (GLOVE) ×1 IMPLANT
GOWN STRL REUS W/ TWL LRG LVL3 (GOWN DISPOSABLE) ×2 IMPLANT
GOWN STRL REUS W/TWL LRG LVL3 (GOWN DISPOSABLE) ×4
ILLUMINATOR WAVEGUIDE N/F (MISCELLANEOUS) IMPLANT
KIT MARKER MARGIN INK (KITS) ×2 IMPLANT
LIGHT WAVEGUIDE WIDE FLAT (MISCELLANEOUS) IMPLANT
LIQUID BAND (GAUZE/BANDAGES/DRESSINGS) ×2 IMPLANT
NDL HYPO 25X1 1.5 SAFETY (NEEDLE) ×1 IMPLANT
NEEDLE HYPO 25X1 1.5 SAFETY (NEEDLE) ×2 IMPLANT
NS IRRIG 1000ML POUR BTL (IV SOLUTION) IMPLANT
PACK BASIN DAY SURGERY FS (CUSTOM PROCEDURE TRAY) ×2 IMPLANT
PENCIL BUTTON HOLSTER BLD 10FT (ELECTRODE) ×2 IMPLANT
SLEEVE SCD COMPRESS KNEE MED (MISCELLANEOUS) ×2 IMPLANT
SPONGE GAUZE 4X4 12PLY STER LF (GAUZE/BANDAGES/DRESSINGS) IMPLANT
SPONGE LAP 4X18 X RAY DECT (DISPOSABLE) ×2 IMPLANT
STRIP CLOSURE SKIN 1/2X4 (GAUZE/BANDAGES/DRESSINGS) ×2 IMPLANT
SUT MNCRL AB 4-0 PS2 18 (SUTURE) ×2 IMPLANT
SUT MON AB 5-0 PS2 18 (SUTURE) IMPLANT
SUT SILK 2 0 SH (SUTURE) IMPLANT
SUT VIC AB 2-0 SH 27 (SUTURE) ×2
SUT VIC AB 2-0 SH 27XBRD (SUTURE) ×1 IMPLANT
SUT VIC AB 3-0 SH 27 (SUTURE) ×2
SUT VIC AB 3-0 SH 27X BRD (SUTURE) ×1 IMPLANT
SUT VIC AB 5-0 PS2 18 (SUTURE) IMPLANT
SYR CONTROL 10ML LL (SYRINGE) ×2 IMPLANT
TOWEL OR 17X24 6PK STRL BLUE (TOWEL DISPOSABLE) ×2 IMPLANT
TOWEL OR NON WOVEN STRL DISP B (DISPOSABLE) ×1 IMPLANT
TUBE CONNECTING 20X1/4 (TUBING) IMPLANT
YANKAUER SUCT BULB TIP NO VENT (SUCTIONS) IMPLANT

## 2015-12-25 NOTE — H&P (Signed)
75 yof who has history of remote bilateral excisional biopsies, family history in paternal aunt with breast cancer at age 76 and sister with ovarian cancer who was noted on screening mm to have a 1.4 cm area of calcifications in the right uoq. her density is c. she underwent core biopsy that shows hg dcis that is er/pr negative. she has no complaints referable to either breast. she does have pmr and is on prednisone. she uses a walker or a cane due to falls.    Other Problems Tawni Pummel, RN; 12/10/2015 7:52 AM) Anxiety Disorder Arthritis Back Pain Colon Cancer Depression Gastroesophageal Reflux Disease Hemorrhoids High blood pressure Hypercholesterolemia Transfusion history  Past Surgical History Tawni Pummel, RN; 12/10/2015 7:52 AM) Breast Biopsy Bilateral. Cataract Surgery Bilateral. Cesarean Section - 1 Foot Surgery Right. Hysterectomy (not due to cancer) - Complete  Diagnostic Studies History Tawni Pummel, RN; 12/10/2015 7:52 AM) Colonoscopy 1-5 years ago Mammogram within last year Pap Smear >5 years ago  Medication History Tawni Pummel, RN; 12/10/2015 7:52 AM) Medications Reconciled  Social History Tawni Pummel, RN; 12/10/2015 7:52 AM) Alcohol use Moderate alcohol use. No caffeine use No drug use Tobacco use Former smoker.  Family History Tawni Pummel, RN; 12/10/2015 7:52 AM) Arthritis Brother, Daughter. Cerebrovascular Accident Mother. Colon Cancer Brother. Heart Disease Father. Hypertension Brother, Father, Mother. Ovarian Cancer Sister.  Pregnancy / Birth History Tawni Pummel, RN; 12/10/2015 7:52 AM) Age at menarche 75 years. Age of menopause 76-50 Contraceptive History Oral contraceptives. Gravida 4 Maternal age 13-25 Para 4    Review of Systems Tawni Pummel RN; 12/10/2015 7:52 AM) General Present- Weight Gain. Not Present- Appetite Loss, Chills, Fatigue, Fever, Night Sweats and Weight Loss. Skin  Present- Dryness. Not Present- Change in Wart/Mole, Hives, Jaundice, New Lesions, Non-Healing Wounds, Rash and Ulcer. HEENT Present- Hoarseness and Wears glasses/contact lenses. Not Present- Earache, Hearing Loss, Nose Bleed, Oral Ulcers, Ringing in the Ears, Seasonal Allergies, Sinus Pain, Sore Throat, Visual Disturbances and Yellow Eyes. Respiratory Present- Difficulty Breathing and Wheezing. Not Present- Bloody sputum, Chronic Cough and Snoring. Breast Not Present- Breast Mass, Breast Pain, Nipple Discharge and Skin Changes. Cardiovascular Present- Swelling of Extremities. Not Present- Chest Pain, Difficulty Breathing Lying Down, Leg Cramps, Palpitations, Rapid Heart Rate and Shortness of Breath. Gastrointestinal Present- Bloating, Constipation and Hemorrhoids. Not Present- Abdominal Pain, Bloody Stool, Change in Bowel Habits, Chronic diarrhea, Difficulty Swallowing, Excessive gas, Gets full quickly at meals, Indigestion, Nausea, Rectal Pain and Vomiting. Female Genitourinary Not Present- Frequency, Nocturia, Painful Urination, Pelvic Pain and Urgency. Musculoskeletal Present- Back Pain, Joint Pain, Joint Stiffness, Muscle Pain and Swelling of Extremities. Not Present- Muscle Weakness. Neurological Present- Trouble walking. Not Present- Decreased Memory, Fainting, Headaches, Numbness, Seizures, Tingling, Tremor and Weakness. Psychiatric Present- Anxiety and Depression. Not Present- Bipolar, Change in Sleep Pattern, Fearful and Frequent crying. Endocrine Present- Hair Changes. Not Present- Cold Intolerance, Excessive Hunger, Heat Intolerance, Hot flashes and New Diabetes. Hematology Present- Easy Bruising. Not Present- Excessive bleeding, Gland problems, HIV and Persistent Infections.   Physical Exam Rolm Bookbinder MD; 12/13/2015 3:31 PM) General Mental Status-Alert. Orientation-Oriented X3.  Chest and Lung Exam Chest and lung exam reveals -on auscultation, normal breath sounds, no  adventitious sounds and normal vocal resonance.  Breast Nipples-No Discharge. Breast Lump-No Palpable Breast Mass. Note: well healed incisions   Cardiovascular Cardiovascular examination reveals -normal heart sounds, regular rate and rhythm with no murmurs.  Lymphatic Head & Neck  General Head & Neck Lymphatics: Bilateral - Description - Normal. Axillary  General Axillary Region: Bilateral - Description - Normal. Note: no Cunningham adenopathy     Assessment & Plan Rolm Bookbinder MD; 12/13/2015 3:33 PM) Aliene Altes CARCINOMA IN SITU (DCIS) OF RIGHT BREAST (D05.11) Story: genetic testing, right breast seed guided lumpectomy We discussed the staging and pathophysiology of breast cancer. We discussed all of the different options for treatment for breast cancer including surgery, chemotherapy, radiation therapy, Herceptin, and antiestrogen therapy. We discussed the options for treatment of the breast cancer which included lumpectomy versus a mastectomy. We discussed the performance of the lumpectomy with radioactive seed placement. We discussed a 5-10% chance of a positive margin requiring reexcision in the operating room. We also discussed that she will need radiation therapy if she undergoes lumpectomy. We discussed the mastectomy (removal of whole breast) and the postoperative care for that as well. Mastectomy can be followed by reconstruction. The decision for lumpectomy vs mastectomy has no impact on decision for chemotherapy. Most mastectomy patients will not need radiation therapy. We discussed that there is no difference in her survival whether she undergoes lumpectomy with radiation therapy or antiestrogen therapy versus a mastectomy. There is also no real difference between her recurrence in the breast. We discussed the risks of operation including bleeding, infection, possible reoperation. She understands her further therapy will be based on what her stages at the time of her  operation.

## 2015-12-25 NOTE — Anesthesia Procedure Notes (Signed)
Procedure Name: LMA Insertion Date/Time: 12/25/2015 1:35 PM Performed by: Melynda Ripple D Pre-anesthesia Checklist: Patient identified, Emergency Drugs available, Suction available and Patient being monitored Patient Re-evaluated:Patient Re-evaluated prior to inductionOxygen Delivery Method: Circle system utilized Preoxygenation: Pre-oxygenation with 100% oxygen Intubation Type: IV induction Ventilation: Mask ventilation without difficulty LMA: LMA inserted LMA Size: 4.0 Number of attempts: 1 Airway Equipment and Method: Bite block Placement Confirmation: positive ETCO2 Tube secured with: Tape Dental Injury: Teeth and Oropharynx as per pre-operative assessment

## 2015-12-25 NOTE — Discharge Instructions (Signed)
[facility] °Office Phone Number [phone] ° °POST OP INSTRUCTIONS ° °Always review your discharge instruction sheet given to you by the facility where your surgery was performed. ° °IF YOU HAVE DISABILITY OR FAMILY LEAVE FORMS, YOU MUST BRING THEM TO THE OFFICE FOR PROCESSING.  DO NOT GIVE THEM TO YOUR DOCTOR. ° °1. A prescription for pain medication may be given to you upon discharge.  Take your pain medication as prescribed, if needed.  If narcotic pain medicine is not needed, then you may take acetaminophen (Tylenol), naprosyn (Alleve) or ibuprofen (Advil) as needed. °2. Take your usually prescribed medications unless otherwise directed °3. If you need a refill on your pain medication, please contact your pharmacy.  They will contact our office to request authorization.  Prescriptions will not be filled after 5pm or on week-ends. °4. You should eat very light the first 24 hours after surgery, such as soup, crackers, pudding, etc.  Resume your normal diet the day after surgery. °5. Most patients will experience some swelling and bruising in the breast.  Ice packs and a good support bra will help.  Wear the breast binder provided or a sports bra for 72 hours day and night.  After that wear a sports bra during the day until you return to the office. Swelling and bruising can take several days to resolve.  °6. It is common to experience some constipation if taking pain medication after surgery.  Increasing fluid intake and taking a stool softener will usually help or prevent this problem from occurring.  A mild laxative (Milk of Magnesia or Miralax) should be taken according to package directions if there are no bowel movements after 48 hours. °7. Unless discharge instructions indicate otherwise, you may remove your bandages 48 hours after surgery and you may shower at that time.  You may have steri-strips (small skin tapes) in place directly over the incision.  These strips should be left on the  skin for 7-10 days and will come off on their own.  If your surgeon used skin glue on the incision, you may shower in 24 hours.  The glue will flake off over the next 2-3 weeks.  Any sutures or staples will be removed at the office during your follow-up visit. °8. ACTIVITIES:  You may resume regular daily activities (gradually increasing) beginning the next day.  Wearing a good support bra or sports bra minimizes pain and swelling.  You may have sexual intercourse when it is comfortable. °a. You may drive when you no longer are taking prescription pain medication, you can comfortably wear a seatbelt, and you can safely maneuver your car and apply brakes. °b. RETURN TO WORK:  ______________________________________________________________________________________ °9. You should see your doctor in the office for a follow-up appointment approximately two weeks after your surgery.  Your doctor’s nurse will typically make your follow-up appointment when she calls you with your pathology report.  Expect your pathology report 3-4 business days after your surgery.  You may call to check if you do not hear from us after three days. °10. OTHER INSTRUCTIONS: _______________________________________________________________________________________________ _____________________________________________________________________________________________________________________________________ °_____________________________________________________________________________________________________________________________________ °_____________________________________________________________________________________________________________________________________ ° °WHEN TO CALL DR WAKEFIELD: °1. Fever over 101.0 °2. Nausea and/or vomiting. °3. Extreme swelling or bruising. °4. Continued bleeding from incision. °5. Increased pain, redness, or drainage from the incision. ° °The clinic staff is available to answer your questions during regular  business hours.  Please don’t hesitate to call and ask to speak to one of the nurses for clinical concerns.  If   you have a medical emergency, go to the nearest emergency room or call 911.  A surgeon from [facility] is always on call at the hospital. ° °For further questions, please visit [email or web address] ° ° ° °Post Anesthesia Home Care Instructions ° °Activity: °Get plenty of rest for the remainder of the day. A responsible adult should stay with you for 24 hours following the procedure.  °For the next 24 hours, DO NOT: °-Drive a car °-Operate machinery °-Drink alcoholic beverages °-Take any medication unless instructed by your physician °-Make any legal decisions or sign important papers. ° °Meals: °Start with liquid foods such as gelatin or soup. Progress to regular foods as tolerated. Avoid greasy, spicy, heavy foods. If nausea and/or vomiting occur, drink only clear liquids until the nausea and/or vomiting subsides. Call your physician if vomiting continues. ° °Special Instructions/Symptoms: °Your throat may feel dry or sore from the anesthesia or the breathing tube placed in your throat during surgery. If this causes discomfort, gargle with warm salt water. The discomfort should disappear within 24 hours. ° °If you had a scopolamine patch placed behind your ear for the management of post- operative nausea and/or vomiting: ° °1. The medication in the patch is effective for 72 hours, after which it should be removed.  Wrap patch in a tissue and discard in the trash. Wash hands thoroughly with soap and water. °2. You may remove the patch earlier than 72 hours if you experience unpleasant side effects which may include dry mouth, dizziness or visual disturbances. °3. Avoid touching the patch. Wash your hands with soap and water after contact with the patch. °  ° °

## 2015-12-25 NOTE — Op Note (Signed)
Preoperative diagnoses:right breast dcis, stage 0 breast cancer Postoperative diagnosis: Same as above Procedure:Right breast seed guided lumpectomy Surgeon: Dr. Serita Grammes Anesthesia: Gen. Estimated blood loss: Minimal Complications: None Drains: None Specimens:right breast tissue marked with paint Sponge and needle count correct at completion Disposition to recovery stable  Indications: This is a 5 yof with multiple comorbidities who presents with newly diagnosed right breast dcis.  We discussed options and will proceed with right breast lumpectomy.  She had a seed placed prior to day of surgery.   Procedure:After informed consent was obtained she was then taken to the operating room. She was given cefazolin. Sequential compression devices on her legs. She was placed under general anesthesia without complication. Her right breast was then prepped and draped in the standard sterile surgical fashion. A surgical timeout was then performed.  I located the radioactive seed with the neoprobe.I infiltrated marcaine in the outer right breast. I elected to use a curvilinear incision on the outer breast due to the fact the seed appeared close to the skin. I opened the skin and entered the breast tissue. The seed was initially identified and then when I used a lap sponge it was gone. The seed was present on the sponge and had just come out of the biopsy cavity immediately after entering through the skin.  This was confirmed by the neoprobe. I took a mm of the seed to confirm and sent to radiology. I then proceeded to do the lumpectomy of this tissue underneath where the biopsy cavity was. This was painted. This was then taken for mammogram which confirmed removal of the clip. This was confirmed by radiology. This was then sent to pathology. Hemostasis was observed. I closed the breast tissue with a 2-0 Vicryl. The dermis was closed with 3-0 Vicryl the skin with 4-0 Monocryl. Dermabond was placed. She  was transferred to recovery stable.

## 2015-12-25 NOTE — Transfer of Care (Signed)
Immediate Anesthesia Transfer of Care Note  Patient: Connie Lang  Procedure(s) Performed: Procedure(s): RIGHT BREAST LUMPECTOMY WITH RADIOACTIVE SEED LOCALIZATION (Right)  Patient Location: PACU  Anesthesia Type:General  Level of Consciousness: awake, alert  and oriented  Airway & Oxygen Therapy: Patient Spontanous Breathing and Patient connected to face mask oxygen  Post-op Assessment: Report given to RN and Post -op Vital signs reviewed and stable  Post vital signs: Reviewed and stable  Last Vitals:  Filed Vitals:   12/25/15 1114  BP: 126/75  Pulse: 93  Temp: 36.8 C  Resp: 18    Last Pain:  Filed Vitals:   12/25/15 1121  PainSc: 0-No pain      Patients Stated Pain Goal: 0 (62/[id number]/97 [id number])  Complications: No apparent anesthesia complications

## 2015-12-25 NOTE — Interval H&P Note (Signed)
History and Physical Interval Note:  12/25/2015 1:12 PM  Connie Lang  has presented today for surgery, with the diagnosis of right breast DCIS  The various methods of treatment have been discussed with the patient and family. After consideration of risks, benefits and other options for treatment, the patient has consented to  Procedure(s): RIGHT BREAST LUMPECTOMY WITH RADIOACTIVE SEED LOCALIZATION (Right) as a surgical intervention .  The patient's history has been reviewed, patient examined, no change in status, stable for surgery.  I have reviewed the patient's chart and labs.  Questions were answered to the patient's satisfaction.     Daelen Belvedere

## 2015-12-25 NOTE — Anesthesia Postprocedure Evaluation (Signed)
Anesthesia Post Note  Patient: Connie Lang  Procedure(s) Performed: Procedure(s) (LRB): RIGHT BREAST LUMPECTOMY WITH RADIOACTIVE SEED LOCALIZATION (Right)  Patient location during evaluation: PACU Anesthesia Type: General Level of consciousness: awake and alert and patient cooperative Pain management: pain level controlled Vital Signs Assessment: post-procedure vital signs reviewed and stable Respiratory status: spontaneous breathing and respiratory function stable Cardiovascular status: stable Anesthetic complications: no    Last Vitals:  Filed Vitals:   12/25/15 1515 12/25/15 1541  BP: 134/77 133/67  Pulse: 92 94  Temp:  36.8 C  Resp: 9 12    Last Pain:  Filed Vitals:   12/25/15 1542  PainSc: [place]

## 2015-12-25 NOTE — Anesthesia Preprocedure Evaluation (Signed)
Anesthesia Evaluation  Patient identified by MRN, date of birth, ID band Patient awake    Reviewed: Allergy & Precautions, NPO status , Patient's Chart, lab work & pertinent test results  Airway Mallampati: II   Neck ROM: full    Dental   Pulmonary former smoker,    breath sounds clear to auscultation       Cardiovascular hypertension,  Rhythm:regular Rate:Normal     Neuro/Psych Anxiety Depression    GI/Hepatic GERD  ,  Endo/Other  obese  Renal/GU      Musculoskeletal  (+) Arthritis , Osteoarthritis,    Abdominal   Peds  Hematology   Anesthesia Other Findings   Reproductive/Obstetrics                             Anesthesia Physical Anesthesia Plan  ASA: II  Anesthesia Plan: General   Post-op Pain Management:    Induction: Intravenous  Airway Management Planned: LMA  Additional Equipment:   Intra-op Plan:   Post-operative Plan:   Informed Consent: I have reviewed the patients History and Physical, chart, labs and discussed the procedure including the risks, benefits and alternatives for the proposed anesthesia with the patient or authorized representative who has indicated his/her understanding and acceptance.     Plan Discussed with: CRNA, Anesthesiologist and Surgeon  Anesthesia Plan Comments:         Anesthesia Quick Evaluation

## 2015-12-26 ENCOUNTER — Encounter (HOSPITAL_BASED_OUTPATIENT_CLINIC_OR_DEPARTMENT_OTHER): Payer: Self-pay | Admitting: General Surgery

## 2015-12-26 NOTE — Addendum Note (Signed)
Addendum  created 12/26/15 0809 by Tawni Millers, CRNA   Modules edited: Charges VN

## 2015-12-30 DIAGNOSIS — M353 Polymyalgia rheumatica: Secondary | ICD-10-CM | POA: Diagnosis not present

## 2015-12-30 DIAGNOSIS — R600 Localized edema: Secondary | ICD-10-CM | POA: Diagnosis not present

## 2015-12-30 DIAGNOSIS — M5136 Other intervertebral disc degeneration, lumbar region: Secondary | ICD-10-CM | POA: Diagnosis not present

## 2015-12-30 DIAGNOSIS — Z7952 Long term (current) use of systemic steroids: Secondary | ICD-10-CM | POA: Diagnosis not present

## 2015-12-30 DIAGNOSIS — M150 Primary generalized (osteo)arthritis: Secondary | ICD-10-CM | POA: Diagnosis not present

## 2015-12-30 DIAGNOSIS — M8580 Other specified disorders of bone density and structure, unspecified site: Secondary | ICD-10-CM | POA: Diagnosis not present

## 2015-12-31 ENCOUNTER — Telehealth: Payer: Self-pay | Admitting: Family Medicine

## 2015-12-31 NOTE — Telephone Encounter (Signed)
Pt request refill  °HYDROcodone-acetaminophen (NORCO) 10-325 MG tablet °

## 2016-01-02 MED ORDER — HYDROCODONE-ACETAMINOPHEN 10-325 MG PO TABS: 1.0000 | ORAL_TABLET | ORAL | Status: DC | PRN

## 2016-01-02 NOTE — Telephone Encounter (Signed)
done

## 2016-01-02 NOTE — Telephone Encounter (Signed)
Script is ready for pick up and I spoke with pt.  

## 2016-01-09 NOTE — Progress Notes (Signed)
Location of Breast Cancer:Right Breast, Upper Outer Quadrant: High Grade, DCIS  Histology per Pathology Report: Ductal Carcinoma  Diagnosis: 12/25/15 Breast, lumpectomy, Right - HIGH GRADE DUCTAL CARCINOMA IN SITU WITH COMEDONECROSIS. - RESECTION MARGINS ARE NEGATIVE FOR CARCINOMA. - BIOPSY SITE CHANGES. - SEE ONCOLOGY TABLE. 11/28/15 Diagnosis Breast, right, needle core biopsy, upper outer quadrant - DUCTAL CARCINOMA IN SITU WITH NECROSIS AND ASSOCIATED CALCIFICATION  Receptor Status: ER(0%), PR (0%), Her2-neu (), Ki()  Presentation found on screening mammography?: Routine mammogram  Past/Anticipated interventions by surgeon, if any: Dr. Rolm Bookbinder:  Lumpectomy Right Breast.last seen 01/09/16  Past/Anticipated interventions by medical oncology, if any: Chemotherapy : None  Lymphedema issues, if any:   no Pain issues, if any:  spinal stenosis, right hip pain  SAFETY ISSUES:yes, uses walker, has had a few falls, has macular degeneraion  Prior radiation? No  Pacemaker/ICD?  NO  Possible current pregnancy? No  Is the patient on methotrexate?   Current Complaints / other details:  Married,  Menarche age 59, 89 pregnancy age 42, G25, P36, BC x 4 years, HRT 4 years  Sister - Hx of Ovarian Cancer Aunt - Hx of Breast Cancer    Deirdre Evener, RN 01/09/2016,3:33 PM    Wt Readings from Last 3 Encounters:  01/12/16 210 lb 14.4 oz (95.664 kg)  12/25/15 211 lb (95.709 kg)  12/10/15 210 lb 11.2 oz (95.573 kg)   BP 119/50 mmHg  Pulse 92  Temp(Src) 98.2 F (36.8 C) (Oral)  Resp 20  Ht 5' 3.5" (1.613 m)  Wt 210 lb 14.4 oz (95.664 kg)  BMI 36.77 kg/m2  SpO2 93%

## 2016-01-12 ENCOUNTER — Ambulatory Visit
Admission: RE | Admit: 2016-01-12 | Discharge: 2016-01-12 | Disposition: A | Discharge status: Home / Self Care | Payer: Medicare Other | Source: Ambulatory Visit | Attending: Radiation Oncology | Admitting: Radiation Oncology

## 2016-01-12 ENCOUNTER — Encounter: Payer: Self-pay | Admitting: Radiation Oncology

## 2016-01-12 VITALS — BP 119/50 | HR 92 | Temp 98.2°F | Resp 20 | Ht 63.5 in | Wt 210.9 lb

## 2016-01-12 DIAGNOSIS — C50411 Malignant neoplasm of upper-outer quadrant of right female breast: Secondary | ICD-10-CM

## 2016-01-12 DIAGNOSIS — Z171 Estrogen receptor negative status [ER-]: Secondary | ICD-10-CM | POA: Insufficient documentation

## 2016-01-12 DIAGNOSIS — Z510 Encounter for antineoplastic radiation therapy: Secondary | ICD-10-CM | POA: Insufficient documentation

## 2016-01-12 NOTE — Progress Notes (Signed)
Radiation Oncology         [phone] ________________________________  Name: Connie Lang MRN: [id number]  Date: 01/12/2016  DOB: 1940/11/02  Follow-Up Visit Note  CC: Laurey Morale, MD  Nicholas Lose, MD  Diagnosis: High grade DCIS of the right breast (ER/PR negative)  Interval Since Last Radiation: N/A  Narrative:  The patient returns today for routine follow-up. The patient presented to breast clinic on 12/10/15. The patient presented was originally with suspicious calcifications on screening mammogram. A diagnostic mammogram confirmed this finding and a stereotactic biopsy of this area was performed. The area in question measures 1.4 cm. This returned positive for DCIS corresponding to a grade 3 tumor. Receptors studies have been performed and the tumor is estrogen receptor negative and progesterone receptor negative. She went on to have a right lumpectomy on 12/25/15. This revealed high grade DCIS with comedonecrosis spanning 1.8 cm (ER/PR negative). The resection margins were negative. The patient and her husband present today to discuss the role of radiation in the management of her disease.  Review of Systems: The patient denies bruising or swelling of the right breast. She reports back pain due to spinal stenosis and right hip pain and continues to use a walker due to this. She describes concerns with range of motion of her right upper extremity for treatment, but has been trying to do the exercises at home instructed by PT. She denies any chest pain or shortness of breath. No other complaints are noted.    Past Medical History:  Past Medical History  Diagnosis Date  . Transfusion history     blood   . Hyperlipemia   . Hypertension   . Osteoarthritis   . Glaucoma     glaucoma and cataracts sees Dr Satira Sark   . Cancer ([place]) 1987    cancerous cells in vaginal wall seemed to be metastatic from the gut but no primary was ever found   . Chicken pox   . DJD (degenerative joint  disease)   . GERD (gastroesophageal reflux disease)   . PMR (polymyalgia rheumatica) ([facility])     sees Dr. Leigh Aurora   . Lumbar disc disease     sees Dr. Sherwood Gambler   . Breast cancer ([place])   . Depression   . Anxiety     Past Surgical History: Past Surgical History  Procedure Laterality Date  . Breast biopsy       x 4  . Appendectomy    . Abdominal hysterectomy      TAH BSO 1985  . Cesarean section    . Tendon repair      rt wrist  . Basal cell carcinoma excision      nose and rt thigh  . Refractive surgery      01-29-2009 04-07-2009 Dr Foye Clock in [place]  . Cervical laminectomy      1983  . Myelogram    . Cervical microdiskectomy      1990 Dr Jovita Gamma  . Lumbar laminectomy      1990 Dr Jovita Gamma  . Colonoscopy  07-06-07    clear, repeat in 10 yrs   . Foot arthrodesis, modified mcbride    . Breast lumpectomy with radioactive seed localization Right 12/25/2015    Procedure: RIGHT BREAST LUMPECTOMY WITH RADIOACTIVE SEED LOCALIZATION;  Surgeon: Rolm Bookbinder, MD;  Location: [facility];  Service: General;  Laterality: Right;    Social History:  Social History   Social History  .  Marital Status: Married    Spouse Name: N/A  . Number of Children: N/A  . Years of Education: N/A   Occupational History  . Not on file.   Social History Main Topics  . Smoking status: Former Smoker    Quit date: 07/06/1995  . Smokeless tobacco: Never Used  . Alcohol Use: 4.2 oz/week    7 Standard drinks or equivalent per week     Comment: 1-2 glasses of wine each day  . Drug Use: No  . Sexual Activity: Not on file   Other Topics Concern  . Not on file   Social History Narrative    Family History: Family History  Problem Relation Age of Onset  . Cancer Sister     ovarian  . Alzheimer's disease Father   . Alzheimer's disease Paternal Aunt   . Breast cancer Paternal Aunt   . Colon cancer Brother     ALLERGIES:  has No Known  Allergies.  Meds: Current Outpatient Prescriptions  Medication Sig Dispense Refill  . anti-nausea (EMETROL) solution Take 30 mLs by mouth as needed for nausea or vomiting.    . magnesium hydroxide (MILK OF MAGNESIA) 400 MG/5ML suspension Take 30 mLs by mouth.    . senna (SENOKOT) 8.6 MG tablet Take 1 tablet by mouth as needed for constipation.    . alendronate (FOSAMAX) 70 MG tablet Take 70 mg by mouth once a week. Take with a full glass of water on an empty stomach.    . amLODipine (NORVASC) 5 MG tablet Take 5 mg by mouth daily.    . Calcium Carb-Cholecalciferol (CALCIUM 600+D3) 600-800 MG-UNIT TABS Take by mouth 2 (two) times daily.    . cyclobenzaprine (FLEXERIL) 10 MG tablet TAKE 1 TABLET BY MOUTH 3 TIMES DAILY AS NEEDED FOR MUSCLE SPASMS. *PRIOR AUTH* 270 tablet 1  . diphenhydrAMINE (BENADRYL) 25 mg capsule Take 25 mg by mouth as needed.    . diphenhydrAMINE-zinc acetate (BENADRYL) cream Apply 1 application topically as needed for itching.    . Fish Oil-Cholecalciferol (FISH OIL + D3 PO) Take by mouth 2 (two) times daily.    . furosemide (LASIX) 40 MG tablet Take 1 tablet (40 mg total) by mouth 2 (two) times daily. 60 tablet 3  . HYDROcodone-acetaminophen (NORCO) 10-325 MG tablet Take 1 tablet by mouth every 6 (six) hours as needed. 20 tablet 0  . HYDROcodone-acetaminophen (NORCO) 10-325 MG tablet Take 1 tablet by mouth every 4 (four) hours as needed for moderate pain. 150 tablet 0  . Misc. Devices (NASAL SPRAY BOTTLE) MISC by Does not apply route as needed.    . NON FORMULARY AREDS 2 Take 2 PO QD    . pantoprazole (PROTONIX) 40 MG tablet Take 1 tablet (40 mg total) by mouth daily. 90 tablet 3  . potassium chloride (KLOR-CON 10) 10 MEQ tablet Take 2 tablets by mouth daily 180 tablet 3  . predniSONE (DELTASONE) 5 MG tablet Take 3 mg by mouth daily. Next week Sunday 01/18/16 starts 2mg daily  0  . Propylene Glycol (SYSTANE BALANCE OP) Apply to eye as needed.      . simvastatin (ZOCOR) 20  MG tablet Take 1 tablet (20 mg total) by mouth at bedtime. 90 tablet 3  . timolol (TIMOPTIC-XR) 0.5 % ophthalmic gel-forming Place 1 drop into both eyes daily.   3  . TRAVATAN Z 0.004 % SOLN ophthalmic solution Place 1 drop into both eyes at bedtime.   3  . vitamin B-12 (CYANOCOBALAMIN)   1000 MCG tablet Take 1,000 mcg by mouth daily.    . [DISCONTINUED] Calcium Carbonate-Vit D-Min 1200-1000 MG-UNIT CHEW Chew 1 tablet by mouth daily.       No current facility-administered medications for this encounter.    Physical Findings:  height is 5' 3.5" (1.613 m) and weight is 210 lb 14.4 oz (95.664 kg). Her oral temperature is 98.2 F (36.8 C). Her blood pressure is 119/50 and her pulse is 92. Her respiration is 20 and oxygen saturation is 93%.  In general this is a well appearing Caucasian female in no acute distress. She's alert and oriented x4 and appropriate throughout the examination. Cardiopulmonary assessment is negative for acute distress and she exhibits normal effort.   Minimal derma bond is noted along the incision of the right breast. Minimal ecchymosis along the lateral margin of her incision is present and the site is without edema or cellulitis. The patient reports discomfort with extending her right upper extremity past 90 degrees of abduction. No visible defects of the shoulder are noted.   Lab Findings: Lab Results  Component Value Date   WBC 15.0* 12/10/2015   HGB 13.8 12/10/2015   HCT 40.8 12/10/2015   MCV 101.0 12/10/2015   PLT 210 12/10/2015     Radiographic Findings: Mm Breast Surgical Specimen  12/25/2015  CLINICAL DATA:  Post right breast lumpectomy. EXAM: SPECIMEN RADIOGRAPH OF THE RIGHT BREAST COMPARISON:  Previous exam(s). FINDINGS: Status post excision of the right breast. The biopsy marker clip is present, completely intact, and was marked for pathology. The radioactive seed is also present, presented in a separate container embedded in a small amount of tissue.  IMPRESSION: Specimen radiograph of the right breast. Electronically Signed   By: Dobrinka  Dimitrova M.D.   On: 12/25/2015 14:09   Mm Rt Radioactive Seed Loc Mammo Guide  12/23/2015  ADDENDUM REPORT: 12/23/2015 15:10 ADDENDUM: Total activity:  0.263 millicuries Electronically Signed   By: Elizabeth  Brown M.D.   On: 12/23/2015 15:10  12/23/2015  CLINICAL DATA:  Patient presents for seed localization prior to lumpectomy for known right breast DCIS. EXAM: MAMMOGRAPHIC GUIDED RADIOACTIVE SEED LOCALIZATION OF THE RIGHT BREAST COMPARISON:  Previous exam(s). FINDINGS: Patient presents for radioactive seed localization prior to lumpectomy. I met with the patient and we discussed the procedure of seed localization including benefits and alternatives. We discussed the high likelihood of a successful procedure. We discussed the risks of the procedure including infection, bleeding, tissue injury and further surgery. We discussed the low dose of radioactivity involved in the procedure. Informed, written consent was given. The usual time-out protocol was performed immediately prior to the procedure. Using mammographic guidance, sterile technique, 1% lidocaine and an I-125 radioactive seed, coil shaped clip in the upper-outer quadrant of the right breast was localized using a cephalad approach. The follow-up mammogram images confirm the seed in the expected location and were marked for Dr. Wakefield. Follow-up survey of the patient confirms presence of the radioactive seed. Order number of I-125 seed:  [id number]. Total activity:  0.263  Reference Date: 12/12/2015 The patient tolerated the procedure well and was [facility]. She was given instructions regarding seed removal. IMPRESSION: Radioactive seed localization of the right breast. No apparent complications. Electronically Signed: By: Elizabeth  Brown M.D. On: 12/23/2015 15:04    Impression: High grade DCIS of the right breast (ER/PR  negative)  Plan:  Dr. Moody met with  the patient today regarding her diagnosis and options for treatment.  We   discussed the role of radiation in decreasing local failures in patients who undergo lumpectomy. We discussed the process of CT simulation and the placement tattoos. We discussed 20 fractions of treatment over 4 weeks as an outpatient. We discussed the possibility of asymptomatic lung damage. We discussed the low likelihood of secondary malignancies. We discussed the possible side effects including but not limited to skin redness, fatigue, permanent skin darkening, and breast swelling. The patient is scheduled to follow up with Dr. Gudena on 01/14/16 at 2:45PM and is scheduled for genetic counseling on 01/27/16. The patient signed a consent form and this was placed in hr medical chart. CT simulation is scheduled 01/21/16 at 3PM and treatment is anticipated to start the following week.   Alison C. Perkins, PAC  This document serves as a record of services personally performed by Alison Perkins, PA-C and [name] Moody, MD. It was created on their behalf by Jalaal Khan, a trained medical scribe. The creation of this record is based on the scribe's personal observations and the providers' statements to them. This document has been checked and approved by the attending provider.  

## 2016-01-12 NOTE — Progress Notes (Signed)
Location of Breast Cancer:Right Breast, Upper Outer Quadrant: High Grade, DCIS  Histology per Pathology Report: Ductal Carcinoma  Diagnosis: 12/25/15 Breast, lumpectomy, Right - HIGH GRADE DUCTAL CARCINOMA IN SITU WITH COMEDONECROSIS. - RESECTION MARGINS ARE NEGATIVE FOR CARCINOMA. - BIOPSY SITE CHANGES. - SEE ONCOLOGY TABLE. 11/28/15 Diagnosis Breast, right, needle core biopsy, upper outer quadrant - DUCTAL CARCINOMA IN SITU WITH NECROSIS AND ASSOCIATED CALCIFICATION  Receptor Status: ER(0%), PR (0%), Her2-neu (), Ki()  Presentation found on screening mammography?: routine mammogramj  Past/Anticipated interventions by surgeon, if any: Dr. Matthew Wakefield:  Lumpectomy Right Breast, last seen 01/09/16  Past/Anticipated interventions by medical oncology, if any: Chemotherapy : None  Lymphedema issues, if any:    NO  Pain issues, if any:  Hydrocodone  Spinal stenosis, right hip pain,   SAFETY ISSUES: Yes, Walker, macular degeneration,  Falls,   Prior radiation? No  Pacemaker/ICD?  No  Possible current pregnancy? No  Is the patient on methotrexate?  No  Current Complaints / other details:  Married,  Menarche age 9, 1 pregnancy age 21, G4, P4, BC x 4 years, HRT 4 years  Sister - Hx of Ovarian Cancer Paternal Aunt - Hx of Breast Cancer    McElroy, Janice Bruner, RN 01/12/2016,3:43 PM BP 119/50 mmHg  Pulse 92  Temp(Src) 98.2 F (36.8 C) (Oral)  Resp 20  Ht 5' 3.5" (1.613 m)  Wt 210 lb 14.4 oz (95.664 kg)  BMI 36.77 kg/m2  Wt Readings from Last 3 Encounters:  01/12/16 210 lb 14.4 oz (95.664 kg)  12/25/15 211 lb (95.709 kg)  12/10/15 210 lb 11.2 oz (95.573 kg)    

## 2016-01-12 NOTE — Progress Notes (Signed)
Please see the Nurse Progress Note in the MD Initial Consult Encounter for this patient. 

## 2016-01-14 ENCOUNTER — Ambulatory Visit: Payer: Medicare Other | Admitting: Hematology and Oncology

## 2016-01-14 DIAGNOSIS — T1511XA Foreign body in conjunctival sac, right eye, initial encounter: Secondary | ICD-10-CM | POA: Diagnosis not present

## 2016-01-21 ENCOUNTER — Ambulatory Visit
Admission: RE | Admit: 2016-01-21 | Discharge: 2016-01-21 | Disposition: A | Discharge status: Home / Self Care | Payer: Medicare Other | Source: Ambulatory Visit | Attending: Radiation Oncology | Admitting: Radiation Oncology

## 2016-01-21 DIAGNOSIS — C50411 Malignant neoplasm of upper-outer quadrant of right female breast: Secondary | ICD-10-CM | POA: Diagnosis not present

## 2016-01-21 DIAGNOSIS — Z510 Encounter for antineoplastic radiation therapy: Secondary | ICD-10-CM | POA: Diagnosis not present

## 2016-01-22 DIAGNOSIS — M8580 Other specified disorders of bone density and structure, unspecified site: Secondary | ICD-10-CM | POA: Diagnosis not present

## 2016-01-22 DIAGNOSIS — M353 Polymyalgia rheumatica: Secondary | ICD-10-CM | POA: Diagnosis not present

## 2016-01-22 DIAGNOSIS — R600 Localized edema: Secondary | ICD-10-CM | POA: Diagnosis not present

## 2016-01-22 DIAGNOSIS — M25511 Pain in right shoulder: Secondary | ICD-10-CM | POA: Diagnosis not present

## 2016-01-22 DIAGNOSIS — M150 Primary generalized (osteo)arthritis: Secondary | ICD-10-CM | POA: Diagnosis not present

## 2016-01-22 DIAGNOSIS — M5136 Other intervertebral disc degeneration, lumbar region: Secondary | ICD-10-CM | POA: Diagnosis not present

## 2016-01-22 DIAGNOSIS — Z7952 Long term (current) use of systemic steroids: Secondary | ICD-10-CM | POA: Diagnosis not present

## 2016-01-23 ENCOUNTER — Encounter: Payer: Self-pay | Admitting: Hematology and Oncology

## 2016-01-23 ENCOUNTER — Telehealth: Payer: Self-pay | Admitting: Hematology and Oncology

## 2016-01-23 ENCOUNTER — Ambulatory Visit (HOSPITAL_BASED_OUTPATIENT_CLINIC_OR_DEPARTMENT_OTHER): Payer: Medicare Other | Admitting: Hematology and Oncology

## 2016-01-23 VITALS — BP 133/79 | HR 94 | Temp 98.0°F | Resp 19 | Wt 204.1 lb

## 2016-01-23 DIAGNOSIS — Z510 Encounter for antineoplastic radiation therapy: Secondary | ICD-10-CM | POA: Diagnosis not present

## 2016-01-23 DIAGNOSIS — C50411 Malignant neoplasm of upper-outer quadrant of right female breast: Secondary | ICD-10-CM | POA: Diagnosis not present

## 2016-01-23 NOTE — Assessment & Plan Note (Signed)
Right lumpectomy 12/25/2015: High-grade DCIS with comedonecrosis, margins negative, ER 0%, PR 0%  Pathology counseling: I discussed the final pathology report of the patient provided  a copy of this report. I discussed the margins. We also discussed the final staging along with previously performed ER/PR  testing.  Treatment plan: Adjuvant radiation therapy No role of antiestrogen therapy because she is ER/PR negative.  Return to clinic in 6 months with survivorship clinic.

## 2016-01-23 NOTE — Telephone Encounter (Signed)
Gave patient avs report and appointments for July 2017 and January 2018.

## 2016-01-23 NOTE — Progress Notes (Signed)
Patient Care Team: Laurey Morale, MD as PCP - General Marygrace Drought, MD (Ophthalmology) Jovita Gamma, MD (Neurosurgery)  DIAGNOSIS: Breast cancer of upper-outer quadrant of right female breast [facility])   Staging form: Breast, AJCC 7th Edition     Clinical stage from 12/10/2015: Stage 0 (Tis (DCIS), N0, M0) - Unsigned   SUMMARY OF ONCOLOGIC HISTORY:   Breast cancer of upper-outer quadrant of right female breast ([place])   11/28/2015 Initial Diagnosis Right breast biopsy UO[facility]: DCIS with necrosis and calcifications, high-grade, ER 0%, PR 0%, mammogram revealed right breast calcifications 1.4 cm, Tis N0 stage 0   12/25/2015 Surgery Right lumpectomy: High-grade DCIS with comedonecrosis, margins negative, ER 0%, PR 0%    CHIEF COMPLIANT: Follow-up to discuss treatment plan after lumpectomy  INTERVAL HISTORY: Connie Lang is a 75 year old who underwent right breast lumpectomy and is about to start adjuvant radiation therapy. She is here today to discuss his surgical report. She reports no problems from the surgery. She is healing extremely well.  REVIEW OF SYSTEMS:   Constitutional: Denies fevers, chills or abnormal weight loss Eyes: Denies blurriness of vision Ears, nose, mouth, throat, and face: Denies mucositis or sore throat Respiratory: Denies cough, dyspnea or wheezes Cardiovascular: Denies palpitation, chest discomfort Gastrointestinal:  Denies nausea, heartburn or change in bowel habits Skin: Denies abnormal skin rashes Lymphatics: Denies new lymphadenopathy or easy bruising Neurological:Denies numbness, tingling or new weaknesses Behavioral/Psych: Mood is stable, no new changes  Extremities: No lower extremity edema Breast: Right lumpectomy All other systems were reviewed with the patient and are negative.  I have reviewed the past medical history, past surgical history, social history and family history with the patient and they are unchanged from previous  note.  ALLERGIES:  has No Known Allergies.  MEDICATIONS:  Current Outpatient Prescriptions  Medication Sig Dispense Refill  . alendronate (FOSAMAX) 70 MG tablet Take 70 mg by mouth once a week. Take with a full glass of water on an empty stomach.    Marland Kitchen amLODipine (NORVASC) 5 MG tablet Take 5 mg by mouth daily.    Marland Kitchen anti-nausea (EMETROL) solution Take 30 mLs by mouth as needed for nausea or vomiting.    . Calcium Carb-Cholecalciferol (CALCIUM 600+D3) 600-800 MG-UNIT TABS Take by mouth 2 (two) times daily.    . cyclobenzaprine (FLEXERIL) 10 MG tablet TAKE 1 TABLET BY MOUTH 3 TIMES DAILY AS NEEDED FOR MUSCLE SPASMS. *PRIOR AUTH* 270 tablet 1  . diphenhydrAMINE (BENADRYL) 25 mg capsule Take 25 mg by mouth as needed.    . diphenhydrAMINE-zinc acetate (BENADRYL) cream Apply 1 application topically as needed for itching.    . Fish Oil-Cholecalciferol (FISH OIL + D3 PO) Take by mouth 2 (two) times daily.    . furosemide (LASIX) 40 MG tablet Take 1 tablet (40 mg total) by mouth 2 (two) times daily. 60 tablet 3  . HYDROcodone-acetaminophen (NORCO) 10-325 MG tablet Take 1 tablet by mouth every 6 (six) hours as needed. 20 tablet 0  . HYDROcodone-acetaminophen (NORCO) 10-325 MG tablet Take 1 tablet by mouth every 4 (four) hours as needed for moderate pain. 150 tablet 0  . magnesium hydroxide (MILK OF MAGNESIA) 400 MG/5ML suspension Take 30 mLs by mouth.    . Misc. Devices (NASAL SPRAY BOTTLE) MISC by Does not apply route as needed.    . NON FORMULARY AREDS 2 Take 2 PO QD    . pantoprazole (PROTONIX) 40 MG tablet Take 1 tablet (40 mg total) by mouth daily. [place]  tablet 3  . potassium chloride (KLOR-CON 10) 10 MEQ tablet Take 2 tablets by mouth daily 180 tablet 3  . predniSONE (DELTASONE) 5 MG tablet Take 3 mg by mouth daily. Next week Sunday 01/18/16 starts '2mg'$  daily  0  . Propylene Glycol (SYSTANE BALANCE OP) Apply to eye as needed.      . senna (SENOKOT) 8.6 MG tablet Take 1 tablet by mouth as needed for  constipation.    . simvastatin (ZOCOR) 20 MG tablet Take 1 tablet (20 mg total) by mouth at bedtime. 90 tablet 3  . timolol (TIMOPTIC-XR) 0.5 % ophthalmic gel-forming Place 1 drop into both eyes daily.   3  . TRAVATAN Z 0.004 % SOLN ophthalmic solution Place 1 drop into both eyes at bedtime.   3  . vitamin B-12 (CYANOCOBALAMIN) 1000 MCG tablet Take 1,000 mcg by mouth daily.    . [DISCONTINUED] Calcium Carbonate-Vit D-Min 1200-1000 MG-UNIT CHEW Chew 1 tablet by mouth daily.       No current facility-administered medications for this visit.    PHYSICAL EXAMINATION: ECOG PERFORMANCE STATUS: 1 - Symptomatic but completely ambulatory  Filed Vitals:   01/23/16 1142  BP: 133/79  Pulse: 94  Temp: 98 F (36.7 C)  Resp: 19   Filed Weights   01/23/16 1142  Weight: 204 lb 1.6 oz (92.579 kg)    GENERAL:alert, no distress and comfortable SKIN: skin color, texture, turgor are normal, no rashes or significant lesions EYES: normal, Conjunctiva are pink and non-injected, sclera clear OROPHARYNX:no exudate, no erythema and lips, buccal mucosa, and tongue normal  NECK: supple, thyroid normal size, non-tender, without nodularity LYMPH:  no palpable lymphadenopathy in the cervical, axillary or inguinal LUNGS: clear to auscultation and percussion with normal breathing effort HEART: regular rate & rhythm and no murmurs and no lower extremity edema ABDOMEN:abdomen soft, non-tender and normal bowel sounds MUSCULOSKELETAL:no cyanosis of digits and no clubbing  NEURO: alert & oriented x 3 with fluent speech, no focal motor/sensory deficits EXTREMITIES: No lower extremity edema  LABORATORY DATA:  I have reviewed the data as listed   Chemistry      Component Value Date/Time   NA 141 12/10/2015 1237   NA 139 11/21/2015 1358   K 3.9 12/10/2015 1237   K 4.1 11/21/2015 1358   CL 102 11/21/2015 1358   CO2 28 12/10/2015 1237   CO2 26 11/21/2015 1358   BUN 17.3 12/10/2015 1237   BUN 18 11/21/2015 1358    CREATININE 1.0 12/10/2015 1237   CREATININE 0.80 11/21/2015 1358      Component Value Date/Time   CALCIUM 10.4 12/10/2015 1237   CALCIUM 9.7 11/21/2015 1358   ALKPHOS 78 12/10/2015 1237   ALKPHOS 77 11/21/2015 1358   AST 37* 12/10/2015 1237   AST 27 11/21/2015 1358   ALT 42 12/10/2015 1237   ALT 33 11/21/2015 1358   BILITOT 0.43 12/10/2015 1237   BILITOT 0.6 11/21/2015 1358       Lab Results  Component Value Date   WBC 15.0* 12/10/2015   HGB 13.8 12/10/2015   HCT 40.8 12/10/2015   MCV 101.0 12/10/2015   PLT 210 12/10/2015   NEUTROABS 11.0* 12/10/2015   ASSESSMENT & PLAN:  Breast cancer of upper-outer quadrant of right female breast ([place]) Right lumpectomy 12/25/2015: High-grade DCIS with comedonecrosis, margins negative, ER 0%, PR 0%  Pathology counseling: I discussed the final pathology report of the patient provided  a copy of this report. I discussed the margins. We also  discussed the final staging along with previously performed ER/PR  testing.  Treatment plan: Adjuvant radiation therapy No role of antiestrogen therapy because she is ER/PR negative.  Return to clinic in 6 months with survivorship clinic.   No orders of the defined types were placed in this encounter.   The patient has a good understanding of the overall plan. she agrees with it. she will call with any problems that may develop before the next visit here.   Rulon Eisenmenger, MD 01/23/2016

## 2016-01-26 ENCOUNTER — Encounter: Payer: Self-pay | Admitting: Genetic Counselor

## 2016-01-26 ENCOUNTER — Telehealth: Payer: Self-pay | Admitting: Family Medicine

## 2016-01-26 MED ORDER — HYDROCODONE-ACETAMINOPHEN 10-325 MG PO TABS: 1.0000 | ORAL_TABLET | Freq: Four times a day (QID) | ORAL | 0 refills | Status: DC | PRN

## 2016-01-26 NOTE — Telephone Encounter (Signed)
Script is ready for pick up here at front office and I spoke with pt.  

## 2016-01-26 NOTE — Telephone Encounter (Signed)
Pt is requesting refill of HYDROcodone-acetaminophen (NORCO) 10-325 MG tablet [[id number]  Did not see on current med list.

## 2016-01-26 NOTE — Telephone Encounter (Signed)
done

## 2016-01-27 ENCOUNTER — Encounter: Payer: Self-pay | Admitting: Genetic Counselor

## 2016-01-27 ENCOUNTER — Ambulatory Visit (HOSPITAL_BASED_OUTPATIENT_CLINIC_OR_DEPARTMENT_OTHER): Payer: Medicare Other | Admitting: Genetic Counselor

## 2016-01-27 ENCOUNTER — Other Ambulatory Visit: Payer: Medicare Other

## 2016-01-27 DIAGNOSIS — Z8041 Family history of malignant neoplasm of ovary: Secondary | ICD-10-CM

## 2016-01-27 DIAGNOSIS — Z85828 Personal history of other malignant neoplasm of skin: Secondary | ICD-10-CM | POA: Diagnosis not present

## 2016-01-27 DIAGNOSIS — Z806 Family history of leukemia: Secondary | ICD-10-CM

## 2016-01-27 DIAGNOSIS — Z315 Encounter for genetic counseling: Secondary | ICD-10-CM

## 2016-01-27 DIAGNOSIS — Z808 Family history of malignant neoplasm of other organs or systems: Secondary | ICD-10-CM

## 2016-01-27 DIAGNOSIS — Z803 Family history of malignant neoplasm of breast: Secondary | ICD-10-CM

## 2016-01-27 DIAGNOSIS — C50411 Malignant neoplasm of upper-outer quadrant of right female breast: Secondary | ICD-10-CM | POA: Diagnosis not present

## 2016-01-27 DIAGNOSIS — Z800 Family history of malignant neoplasm of digestive organs: Secondary | ICD-10-CM | POA: Insufficient documentation

## 2016-01-27 DIAGNOSIS — Z8049 Family history of malignant neoplasm of other genital organs: Secondary | ICD-10-CM

## 2016-01-27 DIAGNOSIS — Z8540 Personal history of malignant neoplasm of unspecified female genital organ: Secondary | ICD-10-CM

## 2016-01-27 NOTE — Progress Notes (Signed)
REFERRING PROVIDER: Nicholas Lose, MD  OTHER PROVIDER(S): WAKEFIELD, MATTHEW, MD Kyung Rudd, MD  PRIMARY PROVIDER:  Laurey Morale, MD  PRIMARY REASON FOR VISIT:  1. Breast cancer of upper-outer quadrant of right female breast ([place])   2. History of female genital cancer   3. History of basal cell carcinoma   4. Family history of ovarian cancer   5. Family history of breast cancer in female   68. Family history of colon cancer   7. Family history of leukemia   8. Family history of skin cancer   9. Family history of cervical cancer      HISTORY OF PRESENT ILLNESS:   Connie Lang, a 75 y.o. female, was seen for a [place] cancer genetics consultation at the request of Dr. Lindi Adie due to a personal history of breast, vaginal wall, and skin cancers, and family history of ovarian, breast, colon, and other cancers.  Connie Lang presents to clinic today with her husband to discuss the possibility of a hereditary predisposition to cancer, genetic testing, and to further clarify her future cancer risks, as well as potential cancer risks for family members.   In 73, at the age of 41, Connie Lang was diagnosed with cancer of the vaginal wall, which she describes as being told this was "related to a colon cancer" but that she had a follow-up normal colonoscopy at the time. This was treated with TAH-BSO.  More recently in May 2017 at age 4, Connie Lang was diagnosed with a DCIS of the right breast.  This was treated with right breast lumpectomy and Connie Lang will begin radiation treatment soon.  Connie Lang also has a history of basal cell carcinoma on her nose.  CANCER HISTORY:    Breast cancer of upper-outer quadrant of right female breast ([place])   11/28/2015 Initial Diagnosis    Right breast biopsy UOQ: DCIS with necrosis and calcifications, high-grade, ER 0%, PR 0%, mammogram revealed right breast calcifications 1.4 cm, Tis N0 stage 0     12/25/2015 Surgery    Right lumpectomy:  High-grade DCIS with comedonecrosis, margins negative, ER 0%, PR 0%       HORMONAL RISK FACTORS:  Menarche was at age 59.  First live birth at age 5.  OCP use for approximately "a few years". Ovaries intact: no.  Hysterectomy: yes.  Menopausal status: postmenopausal.  HRT use: 10 years. Colonoscopy: yes, reports history of multiple polyps, but less than 10 total; is due for colonoscopy within the next year. Mammogram within the last year: yes. Number of breast biopsies: multiple; history of 4 benign breast surgeries. Up to date with pelvic exams:  no. Any excessive radiation exposure/other exposures in the past:  no, but does report limited additional secondhand smoke exposure  Past Medical History:  Diagnosis Date  . Anxiety   . Breast cancer ([place])   . Cancer ([place]) 1987   cancerous cells in vaginal wall seemed to be metastatic from the gut but no primary was ever found   . Chicken pox   . Depression   . DJD (degenerative joint disease)   . GERD (gastroesophageal reflux disease)   . Glaucoma    glaucoma and cataracts sees Dr Satira Sark   . Hyperlipemia   . Hypertension   . Lumbar disc disease    sees Dr. Sherwood Gambler   . Osteoarthritis   . PMR (polymyalgia rheumatica) ([facility])    sees Dr. Leigh Aurora   . Transfusion history    blood  Past Surgical History:  Procedure Laterality Date  . ABDOMINAL HYSTERECTOMY     TAH BSO 1985  . APPENDECTOMY    . BASAL CELL CARCINOMA EXCISION     nose and rt thigh  . BREAST BIOPSY      x 4  . BREAST LUMPECTOMY WITH RADIOACTIVE SEED LOCALIZATION Right 12/25/2015   Procedure: RIGHT BREAST LUMPECTOMY WITH RADIOACTIVE SEED LOCALIZATION;  Surgeon: Rolm Bookbinder, MD;  Location: [facility];  Service: General;  Laterality: Right;  . [place]  . cervical microdiskectomy     1990 Dr Jovita Gamma  . CESAREAN SECTION    . COLONOSCOPY  07-06-07   clear, repeat in 10 yrs   . FOOT ARTHRODESIS, MODIFIED  [facility]    . LUMBAR LAMINECTOMY     1990 Dr Jovita Gamma  . [place] SURGERY     01-29-2009 04-07-2009 Dr Foye Clock in [place]     rt wrist    Social History   Social History  . Marital status: Married    Spouse name: N/A  . Number of children: N/A  . Years of education: N/A   Social History Main Topics  . Smoking status: Former Smoker    Packs/day: 0.50    Years: 36.00    Types: Cigarettes    Quit date: 07/06/1995  . Smokeless tobacco: Never Used  . Alcohol use 4.2 oz/week    7 Standard drinks or equivalent per week     Comment: 1-2 glasses of wine each day; 1-3 x per wk  . Drug use: No  . Sexual activity: Not Asked   Other Topics Concern  . None   Social History Narrative  . None     FAMILY HISTORY:  We obtained a detailed, 4-generation family history.  Significant diagnoses are listed below: Family History  Problem Relation Age of Onset  . Alzheimer's disease Father   . Heart failure Father     d. 53  . Stroke Mother 3  . Cancer Sister     ovarian  . Alzheimer's disease Paternal Aunt   . Breast cancer Paternal Aunt   . Colon cancer Brother     dx. 53s  . Stroke Maternal Aunt     d. 78s  . Heart disease Maternal Grandfather   . Diabetes Maternal Grandfather   . Stroke Paternal Grandmother 73  . Heart attack Paternal Grandfather     d. 46s  . Ovarian cancer Sister     dx. 69s; no genetic testing  . Leukemia Grandchild 21  . Skin cancer Grandchild   . Cervical cancer Cousin 58    maternal 1st cousin  . Alzheimer's disease Paternal Aunt     (x4) paternal aunts  . Heart attack Paternal Uncle 55    Connie Lang hs three daughters and one son, ages 59-54.  None of her children have been diagnosed with cancer, but her oldest daughter has had "a few" polyps on colonoscopy.  Two of her grandchildren have had cancer--one grandson was diagnosed with leukemia at age 63 and one granddaughter has a history of skin cancer.   Connie Lang has two full sisters and one full brother.  Her brother is currently 31; he was diagnosed with colon cancer in his 12s.  He has one son and one daughter who are both cancer-free.  One sister was diagnosed with ovarian cancer in her 68s and is  currently 70 and cancer-free.  Connie Lang does not believe that this sister has had genetic testing.  This sister also has one son and one daughter who are cancer-free.  Connie Lang other sister is currently 46 and has never been diagnosed with cancer.    Connie Lang mother died of a stroke at 15.  She had three full brothers, one full sister, and one paternal half-brother.  Most of these siblings passed away at later ages and never had cancer, to Connie Lang's knowledge.  One sister died of a stroke in her 63s.  One brother passed away in his 40s, perhaps due to alcohol use.  One of Connie Lang maternal first cousins was diagnosed with cervical cancer at 28.  She is unaware of any additional cancers for other maternal first cousins.  Her maternal grandmother died of tuberculosis at 60.  Her grandfather died of heart disease and diabetes in his 21s.  She has no information for her paternal great aunts/uncles and great grandparents.    Connie Lang father died at 49 from heart failure and alzheimer's.  He was one of fourteen children.  One of his sisters passed away in early childhood from an unspecified cause.  Six sisters passed away at mostly later ages, 69 of whom had alzheimer's disease.  Two sisters are still living and one was diagnosed with breast cancer at an unspecified age.  All four of Mr. Bedore's brother have passed, most have passed away before the age of 29, due to heart attacks.  Connie Lang has limited information for her paternal cousins.  Her paternal grandmother died of a stroke at 82.  Her grandfather passed away due to a heart attack in his 64s.  Connie Lang has no further information for her paternal great aunts/uncles  and great grandparents.    Patient's maternal ancestors are of [place], [place], and [place] descent, and paternal ancestors are of [place] and [place] descent. There is no reported Ashkenazi Jewish ancestry. There is no known consanguinity.  GENETIC COUNSELING ASSESSMENT: Connie Lang is a 75 y.o. female with a personal and family history of cancer which is somewhat suggestive of a hereditary cancer syndrome and predisposition to cancer. We, therefore, discussed and recommended the following at today's visit.   DISCUSSION: We reviewed the characteristics, features and inheritance patterns of hereditary cancer syndromes, particularly those caused by mutations within the BRCA1/2 genes. We also discussed genetic testing, including the appropriate family members to test, the process of testing, insurance coverage and turn-around-time for results. We discussed the implications of a negative, positive and/or variant of uncertain significant result. We recommended Ms. Klink pursue genetic testing for the 20-gene Breast/Ovarian Cancer Panel versus the 32-gene Comprehensive Cancer Panel through [facility] (due to question of previous vaginal wall cancer being a GI primary).   Ms. Koebel is not interested in genetic testing at this time; she is not sure that it would be helpful for her and she feels like she is at a low risk to have a hereditary cancer syndrome due to several family members never having had cancer.  She will consider this option further and will contact us to schedule a lab appointment, if she decides to pursue testing.  PLAN: Despite our recommendation, Ms. Milliron did not wish to pursue genetic testing at today's visit. We understand this decision, and remain available to coordinate genetic testing at any time in the future. We, therefore, recommend Ms. Sanko continue to follow the cancer screening guidelines given  by her primary healthcare provider.  Based on Ms. Kosel's  family history, we recommended her sister, who was diagnosed with ovarian cancer in her 63s, have genetic counseling and testing. Ms. Drahos will let us know if we can be of any assistance in coordinating genetic counseling and/or testing for this family member.   Thank you for the referral and allowing [place] to share in the care of your patient.   Jeanine Luz, MS, [facility] Certified Genetic Counselor [email or web address] Phone: [phone]  The patient was seen for a total of 65 minutes in face-to-face genetic counseling.  This patient was discussed with Drs. Magrinat, Lindi Adie and/or Burr Medico who agrees with the above.    _______________________________________________________________________ For Office Staff:  Number of people involved in session: 2 Was an Intern/ student involved with case: no

## 2016-01-28 ENCOUNTER — Ambulatory Visit
Admission: RE | Admit: 2016-01-28 | Discharge: 2016-01-28 | Disposition: A | Discharge status: Home / Self Care | Payer: Medicare Other | Source: Ambulatory Visit | Attending: Radiation Oncology | Admitting: Radiation Oncology

## 2016-01-28 DIAGNOSIS — Z510 Encounter for antineoplastic radiation therapy: Secondary | ICD-10-CM | POA: Diagnosis not present

## 2016-01-28 DIAGNOSIS — C50411 Malignant neoplasm of upper-outer quadrant of right female breast: Secondary | ICD-10-CM | POA: Diagnosis not present

## 2016-01-29 ENCOUNTER — Inpatient Hospital Stay: Admission: RE | Admit: 2016-01-29 | Payer: Self-pay | Source: Ambulatory Visit | Admitting: Radiation Oncology

## 2016-01-29 ENCOUNTER — Ambulatory Visit
Admission: RE | Admit: 2016-01-29 | Discharge: 2016-01-29 | Disposition: A | Discharge status: Home / Self Care | Payer: Medicare Other | Source: Ambulatory Visit | Attending: Radiation Oncology | Admitting: Radiation Oncology

## 2016-01-29 DIAGNOSIS — C50411 Malignant neoplasm of upper-outer quadrant of right female breast: Secondary | ICD-10-CM

## 2016-01-29 DIAGNOSIS — Z510 Encounter for antineoplastic radiation therapy: Secondary | ICD-10-CM | POA: Diagnosis not present

## 2016-01-29 MED ORDER — ALRA NON-METALLIC DEODORANT (RAD-ONC)
1.0000 "application " | Freq: Once | TOPICAL | Status: AC
  Administered 2016-01-29: 1 via TOPICAL

## 2016-01-29 MED ORDER — RADIAPLEXRX EX GEL
Freq: Once | CUTANEOUS | Status: AC
  Administered 2016-01-29: 12:00:00 via TOPICAL

## 2016-01-29 NOTE — Progress Notes (Signed)
Patient education done, radiation therapy and you book, my business card, skin products radiaplex gel and alra deodorant given , discussed ways to manage side effects,skin irritation, pain, breast swelling/soreness, fatuigue, use only electric shaver for under axilla, increase protein in diet, stay hydrated, drink plenty of fluids,verbal understanding, teach back given 12:30 PM

## 2016-01-30 ENCOUNTER — Ambulatory Visit
Admission: RE | Admit: 2016-01-30 | Discharge: 2016-01-30 | Disposition: A | Discharge status: Home / Self Care | Payer: Medicare Other | Source: Ambulatory Visit | Attending: Radiation Oncology | Admitting: Radiation Oncology

## 2016-01-30 ENCOUNTER — Encounter: Payer: Self-pay | Admitting: Radiation Oncology

## 2016-01-30 VITALS — BP 116/59 | HR 92 | Temp 98.4°F | Resp 18 | Ht 63.5 in | Wt 206.0 lb

## 2016-01-30 DIAGNOSIS — Z510 Encounter for antineoplastic radiation therapy: Secondary | ICD-10-CM | POA: Diagnosis not present

## 2016-01-30 DIAGNOSIS — C50411 Malignant neoplasm of upper-outer quadrant of right female breast: Secondary | ICD-10-CM

## 2016-01-30 NOTE — Progress Notes (Signed)
Connie Lang has received 2 radiations fractions to her right breast.  Vital signs and weight stable.  Skin to right breast with normal color, using Radiaplex gel bid.   Appetite is good.  Np pain from her right breast, having joint pain from degenerative bone pain 3/10 taking Hydrocodone and Flexeril.  Having fatigue most of the day. BP (!) 116/59   Pulse 92   Temp 98.4 F (36.9 C) (Oral)   Resp 18   Ht 5' 3.5" (1.613 m)   Wt 206 lb (93.4 kg)   SpO2 98%   BMI 35.92 kg/m

## 2016-01-30 NOTE — Progress Notes (Signed)
  Radiation Oncology         [phone]   Name: Connie Lang MRN: [id number]   Date: 01/30/2016  DOB: 1941/04/07   Weekly Radiation Therapy Management    ICD-9-CM ICD-10-CM   1. Breast cancer of upper-outer quadrant of right female breast ([place]) 174.4 C50.411     Current Dose: 5 Gy  Planned Dose:  42.5 Gy  Narrative The patient presents for routine under treatment assessment. Connie Lang has received 2 radiations fractions to her right breast.  Vital signs and weight stable.  Skin to right breast with normal color, using Radiaplex gel bid.   Appetite is good.  Np pain from her right breast, having joint pain from degenerative bone pain 3/10 taking Hydrocodone and Flexeril.  Having fatigue most of the day.   The patient is without complaint. Set-up films were reviewed. The chart was checked.  Physical Findings  height is 5' 3.5" (1.613 m) and weight is 206 lb (93.4 kg). Her oral temperature is 98.4 F (36.9 C). Her blood pressure is 116/59 (abnormal) and her pulse is 92. Her respiration is 18 and oxygen saturation is 98%. . Weight essentially stable.  No significant changes.  Impression The patient is tolerating radiation.  Plan Continue treatment as planned.         Sheral Apley Tammi Klippel, M.D.  This document serves as a record of services personally performed by Shona Simpson, PA-C and Tyler Pita, MD. It was created on their behalf by Truddie Hidden, a trained medical scribe. The creation of this record is based on the scribe's personal observations and the providers' statements to them. This document has been checked and approved by the attending provider.

## 2016-02-01 ENCOUNTER — Other Ambulatory Visit: Payer: Self-pay | Admitting: Family Medicine

## 2016-02-02 ENCOUNTER — Ambulatory Visit
Admission: RE | Admit: 2016-02-02 | Discharge: 2016-02-02 | Disposition: A | Discharge status: Home / Self Care | Payer: Medicare Other | Source: Ambulatory Visit | Attending: Radiation Oncology | Admitting: Radiation Oncology

## 2016-02-02 DIAGNOSIS — Z510 Encounter for antineoplastic radiation therapy: Secondary | ICD-10-CM | POA: Diagnosis not present

## 2016-02-02 NOTE — Telephone Encounter (Signed)
Pt is requesting a refill. Last filled on 06/19/15 #270 +1, last OV 11/21/15. Ok to refill?

## 2016-02-03 ENCOUNTER — Ambulatory Visit
Admission: RE | Admit: 2016-02-03 | Discharge: 2016-02-03 | Disposition: A | Discharge status: Home / Self Care | Payer: Medicare Other | Source: Ambulatory Visit | Attending: Radiation Oncology | Admitting: Radiation Oncology

## 2016-02-03 DIAGNOSIS — Z510 Encounter for antineoplastic radiation therapy: Secondary | ICD-10-CM | POA: Diagnosis not present

## 2016-02-03 DIAGNOSIS — C50411 Malignant neoplasm of upper-outer quadrant of right female breast: Secondary | ICD-10-CM | POA: Diagnosis not present

## 2016-02-04 ENCOUNTER — Encounter: Payer: Self-pay | Admitting: Family Medicine

## 2016-02-04 ENCOUNTER — Ambulatory Visit
Admission: RE | Admit: 2016-02-04 | Discharge: 2016-02-04 | Disposition: A | Discharge status: Home / Self Care | Payer: Medicare Other | Source: Ambulatory Visit | Attending: Radiation Oncology | Admitting: Radiation Oncology

## 2016-02-04 DIAGNOSIS — Z510 Encounter for antineoplastic radiation therapy: Secondary | ICD-10-CM | POA: Diagnosis not present

## 2016-02-05 ENCOUNTER — Telehealth: Payer: Self-pay | Admitting: *Deleted

## 2016-02-05 ENCOUNTER — Ambulatory Visit
Admission: RE | Admit: 2016-02-05 | Discharge: 2016-02-05 | Disposition: A | Discharge status: Home / Self Care | Payer: Medicare Other | Source: Ambulatory Visit | Attending: Radiation Oncology | Admitting: Radiation Oncology

## 2016-02-05 DIAGNOSIS — Z510 Encounter for antineoplastic radiation therapy: Secondary | ICD-10-CM | POA: Diagnosis not present

## 2016-02-05 MED ORDER — FUROSEMIDE 40 MG PO TABS: 40.0000 mg | ORAL_TABLET | Freq: Two times a day (BID) | ORAL | 1 refills | Status: DC

## 2016-02-05 NOTE — Telephone Encounter (Signed)
  Oncology Nurse Navigator Documentation  Navigator Location: [facility] Onc (02/05/16 1200) Navigator Encounter Type: Telephone (02/05/16 1200) Telephone: [place] Call (02/05/16 1200)         Patient Visit Type: RadOnc (02/05/16 1200) Treatment Phase: First Radiation Tx (02/05/16 1200)                            Time Spent with Patient: 15 (02/05/16 1200)

## 2016-02-06 ENCOUNTER — Ambulatory Visit
Admission: RE | Admit: 2016-02-06 | Discharge: 2016-02-06 | Disposition: A | Discharge status: Home / Self Care | Payer: Medicare Other | Source: Ambulatory Visit | Attending: Radiation Oncology | Admitting: Radiation Oncology

## 2016-02-06 ENCOUNTER — Encounter: Payer: Self-pay | Admitting: Radiation Oncology

## 2016-02-06 VITALS — BP 118/72 | HR 102 | Temp 98.5°F | Resp 16 | Wt 203.1 lb

## 2016-02-06 DIAGNOSIS — Z510 Encounter for antineoplastic radiation therapy: Secondary | ICD-10-CM | POA: Diagnosis not present

## 2016-02-06 DIAGNOSIS — C50411 Malignant neoplasm of upper-outer quadrant of right female breast: Secondary | ICD-10-CM

## 2016-02-06 NOTE — Progress Notes (Addendum)
Weekly rad txs right breast 7/20 completed, mild pink tinge color on breast, skin intact, using radiaplex bid, appetite good, no c/o pan,  No fatigue 3:49 PM BP 118/72 (BP Location: Left Arm, Patient Position: Sitting, Cuff Size: Normal)   Pulse (!) 102   Temp 98.5 F (36.9 C) (Oral)   Resp 16   Wt 203 lb 1.6 oz (92.1 kg)   BMI 35.41 kg/m   Wt Readings from Last 3 Encounters:  02/06/16 203 lb 1.6 oz (92.1 kg)  01/30/16 206 lb (93.4 kg)  01/23/16 204 lb 1.6 oz (92.6 kg)

## 2016-02-06 NOTE — Progress Notes (Signed)
  Radiation Oncology         [phone] ________________________________  Name: Connie Lang MRN: [id number]  Date: 01/21/2016  DOB: February 29, 1941   DIAGNOSIS:     ICD-9-CM ICD-10-CM   1. Breast cancer of upper-outer quadrant of right female breast ([place]) 174.4 C50.411     SIMULATION AND TREATMENT PLANNING NOTE  The patient presented for simulation prior to beginning her course of radiation treatment for her diagnosis of Right-sided breast cancer. The patient was placed in a supine position on a breast board. A customized vac-lock bag was constructed and this complex treatment device will be used on a daily basis during her treatment. In this fashion, a CT scan was obtained through the chest area and an isocenter was placed near the chest wall within the breast.  The patient will be planned to receive a course of radiation initially to a dose of 42.5 Gy. This will consist of a whole breast radiotherapy technique. To accomplish this, 2 customized blocks have been designed which will correspond to medial and lateral whole breast tangent fields. This treatment will be accomplished at 2.5 Gy per fraction. A forward planning technique will also be evaluated to determine if this approach improves the plan. It is anticipated that the patient will then receive a 7.5 Gy boost to the seroma cavity which has been contoured. This will be accomplished at 2.5 Gy per fraction.   This initial treatment will consist of a 3-D conformal technique. The seroma has been contoured as the primary target structure. Additionally, dose volume histograms of both this target as well as the lungs and heart will also be evaluated. Such an approach is necessary to ensure that the target area is adequately covered while the nearby critical  normal structures are adequately spared.  Plan:  The final anticipated total dose therefore will correspond to 50 Gy.    _______________________________   Jodelle Gross, MD, PhD

## 2016-02-06 NOTE — Progress Notes (Signed)
  Radiation Oncology         [phone] ________________________________  Name: Connie Lang MRN: [id number]  Date: 01/21/2016  DOB: Jun 15, 1941  Optical Surface Tracking Plan:  Since intensity modulated radiotherapy (IMRT) and 3D conformal radiation treatment methods are predicated on accurate and precise positioning for treatment, intrafraction motion monitoring is medically necessary to ensure accurate and safe treatment delivery.  The ability to quantify intrafraction motion without excessive ionizing radiation dose can only be performed with optical surface tracking. Accordingly, surface imaging offers the opportunity to obtain 3D measurements of patient position throughout IMRT and 3D treatments without excessive radiation exposure.  I am ordering optical surface tracking for this patient's upcoming course of radiotherapy. ________________________________  Kyung Rudd, MD 02/06/2016 4:49 PM    Reference:   Particia Jasper, et al. Surface imaging-based analysis of intrafraction motion for breast radiotherapy patients.Journal of [place], n. 6, nov. 2014. ISSN [id number].   Available at: <http://www.jacmp.org/index.php/jacmp/article/view/4957>.

## 2016-02-06 NOTE — Progress Notes (Signed)
Department of Radiation Oncology  Phone:  [phone] Fax:        [phone]  Weekly Treatment Note    Name: Connie Lang Date: 02/06/2016 MRN: [id number] DOB: 1941/02/17   Diagnosis:     ICD-9-CM ICD-10-CM   1. Breast cancer of upper-outer quadrant of right female breast ([place]) 174.4 C50.411      Current dose: 17.5 Gy  Current fraction:7   MEDICATIONS: Current Outpatient Prescriptions  Medication Sig Dispense Refill  . alendronate (FOSAMAX) 70 MG tablet Take 70 mg by mouth once a week. Take with a full glass of water on an empty stomach.    Marland Kitchen amLODipine (NORVASC) 5 MG tablet Take 5 mg by mouth daily.    Marland Kitchen anti-nausea (EMETROL) solution Take 30 mLs by mouth as needed for nausea or vomiting.    . Calcium Carb-Cholecalciferol (CALCIUM 600+D3) 600-800 MG-UNIT TABS Take by mouth 2 (two) times daily.    . cyclobenzaprine (FLEXERIL) 10 MG tablet TAKE ONE TABLET BY MOUTH THREE TIMES DAILY AS NEEDED FOR MUSCLE SPASMS 270 tablet 1  . diphenhydrAMINE (BENADRYL) 25 mg capsule Take 25 mg by mouth as needed.    . diphenhydrAMINE-zinc acetate (BENADRYL) cream Apply 1 application topically as needed for itching.    . Fish Oil-Cholecalciferol (FISH OIL + D3 PO) Take by mouth 2 (two) times daily.    . furosemide (LASIX) 40 MG tablet Take 1 tablet (40 mg total) by mouth 2 (two) times daily. 180 tablet 1  . hyaluronate sodium (RADIAPLEXRX) GEL Apply 1 application topically 2 (two) times daily.    Marland Kitchen HYDROcodone-acetaminophen (NORCO) 10-325 MG tablet Take 1 tablet by mouth every 6 (six) hours as needed for severe pain. 120 tablet 0  . magnesium hydroxide (MILK OF MAGNESIA) 400 MG/5ML suspension Take 30 mLs by mouth.    . Misc. Devices (NASAL SPRAY BOTTLE) MISC by Does not apply route as needed.    . NON FORMULARY AREDS 2 Take 2 PO QD    . non-metallic deodorant (ALRA) MISC Apply 1 application topically daily as needed.    . pantoprazole (PROTONIX) 40 MG tablet Take 1 tablet (40 mg  total) by mouth daily. 90 tablet 3  . potassium chloride (KLOR-CON 10) 10 MEQ tablet Take 2 tablets by mouth daily 180 tablet 3  . senna (SENOKOT) 8.6 MG tablet Take 1 tablet by mouth as needed for constipation.    . simvastatin (ZOCOR) 20 MG tablet Take 1 tablet (20 mg total) by mouth at bedtime. 90 tablet 3  . timolol (TIMOPTIC-XR) 0.5 % ophthalmic gel-forming Place 1 drop into both eyes daily.   3  . TRAVATAN Z 0.004 % SOLN ophthalmic solution Place 1 drop into both eyes at bedtime.   3  . vitamin B-12 (CYANOCOBALAMIN) 1000 MCG tablet Take 1,000 mcg by mouth daily.     No current facility-administered medications for this encounter.      ALLERGIES: Review of patient's allergies indicates no known allergies.   LABORATORY DATA:  Lab Results  Component Value Date   WBC 15.0 (H) 12/10/2015   HGB 13.8 12/10/2015   HCT 40.8 12/10/2015   MCV 101.0 12/10/2015   PLT 210 12/10/2015   Lab Results  Component Value Date   NA 141 12/10/2015   K 3.9 12/10/2015   CL 102 11/21/2015   CO2 28 12/10/2015   Lab Results  Component Value Date   ALT 42 12/10/2015   AST 37 (H) 12/10/2015   ALKPHOS 78 12/10/2015  BILITOT 0.43 12/10/2015     NARRATIVE: Connie Lang was seen today for weekly treatment management. The chart was checked and the patient's films were reviewed.  Weekly rad txs right breast 7/20 completed, mild pink tinge color on breast, skin intact, using radiaplex bid, appetite good, no c/o pan,  No fatigue 4:48 PM BP 118/72 (BP Location: Left Arm, Patient Position: Sitting, Cuff Size: Normal)   Pulse (!) 102   Temp 98.5 F (36.9 C) (Oral)   Resp 16   Wt 203 lb 1.6 oz (92.1 kg)   BMI 35.41 kg/m   Wt Readings from Last 3 Encounters:  02/06/16 203 lb 1.6 oz (92.1 kg)  01/30/16 206 lb (93.4 kg)  01/23/16 204 lb 1.6 oz (92.6 kg)    PHYSICAL EXAMINATION: weight is 203 lb 1.6 oz (92.1 kg). Her oral temperature is 98.5 F (36.9 C). Her blood pressure is 118/72 and her  pulse is 102 (abnormal). Her respiration is 16.        ASSESSMENT: The patient is doing satisfactorily with treatment.  PLAN: We will continue with the patient's radiation treatment as planned.

## 2016-02-09 ENCOUNTER — Ambulatory Visit
Admission: RE | Admit: 2016-02-09 | Discharge: 2016-02-09 | Disposition: A | Discharge status: Home / Self Care | Payer: Medicare Other | Source: Ambulatory Visit | Attending: Radiation Oncology | Admitting: Radiation Oncology

## 2016-02-09 DIAGNOSIS — Z510 Encounter for antineoplastic radiation therapy: Secondary | ICD-10-CM | POA: Diagnosis not present

## 2016-02-10 ENCOUNTER — Ambulatory Visit
Admission: RE | Admit: 2016-02-10 | Discharge: 2016-02-10 | Disposition: A | Discharge status: Home / Self Care | Payer: Medicare Other | Source: Ambulatory Visit | Attending: Radiation Oncology | Admitting: Radiation Oncology

## 2016-02-10 DIAGNOSIS — Z510 Encounter for antineoplastic radiation therapy: Secondary | ICD-10-CM | POA: Diagnosis not present

## 2016-02-11 ENCOUNTER — Encounter: Payer: Self-pay | Admitting: Radiation Oncology

## 2016-02-11 ENCOUNTER — Ambulatory Visit
Admission: RE | Admit: 2016-02-11 | Discharge: 2016-02-11 | Disposition: A | Discharge status: Home / Self Care | Payer: Medicare Other | Source: Ambulatory Visit | Attending: Radiation Oncology | Admitting: Radiation Oncology

## 2016-02-11 DIAGNOSIS — Z510 Encounter for antineoplastic radiation therapy: Secondary | ICD-10-CM | POA: Diagnosis not present

## 2016-02-12 ENCOUNTER — Ambulatory Visit
Admission: RE | Admit: 2016-02-12 | Discharge: 2016-02-12 | Disposition: A | Discharge status: Home / Self Care | Payer: Medicare Other | Source: Ambulatory Visit | Attending: Radiation Oncology | Admitting: Radiation Oncology

## 2016-02-12 DIAGNOSIS — Z510 Encounter for antineoplastic radiation therapy: Secondary | ICD-10-CM | POA: Diagnosis not present

## 2016-02-13 ENCOUNTER — Ambulatory Visit
Admission: RE | Admit: 2016-02-13 | Discharge: 2016-02-13 | Disposition: A | Discharge status: Home / Self Care | Payer: Medicare Other | Source: Ambulatory Visit | Attending: Radiation Oncology | Admitting: Radiation Oncology

## 2016-02-13 ENCOUNTER — Encounter: Payer: Self-pay | Admitting: Radiation Oncology

## 2016-02-13 VITALS — BP 132/56 | HR 103 | Temp 97.8°F | Wt 203.7 lb

## 2016-02-13 DIAGNOSIS — Z510 Encounter for antineoplastic radiation therapy: Secondary | ICD-10-CM | POA: Insufficient documentation

## 2016-02-13 DIAGNOSIS — Z79899 Other long term (current) drug therapy: Secondary | ICD-10-CM

## 2016-02-13 DIAGNOSIS — C50411 Malignant neoplasm of upper-outer quadrant of right female breast: Secondary | ICD-10-CM | POA: Insufficient documentation

## 2016-02-13 MED ORDER — RADIAPLEXRX EX GEL
Freq: Once | CUTANEOUS | Status: AC
  Administered 2016-02-13: 15:00:00 via TOPICAL

## 2016-02-13 NOTE — Progress Notes (Signed)
Department of Radiation Oncology  Phone:  [phone] Fax:        [phone]  Weekly Treatment Note    Name: Connie Lang Date: 02/15/2016 MRN: [id number] DOB: Sep 19, 1940   Diagnosis:     ICD-9-CM ICD-10-CM   1. Breast cancer of upper-outer quadrant of right female breast (HCC) 174.4 C50.411 hyaluronate sodium (RADIAPLEXRX) gel     Current dose: 30 Gy  Current fraction:12   MEDICATIONS: Current Outpatient Prescriptions  Medication Sig Dispense Refill  . alendronate (FOSAMAX) 70 MG tablet Take 70 mg by mouth once a week. Take with a full glass of water on an empty stomach.    Marland Kitchen amLODipine (NORVASC) 5 MG tablet Take 5 mg by mouth daily.    Marland Kitchen anti-nausea (EMETROL) solution Take 30 mLs by mouth as needed for nausea or vomiting.    . Calcium Carb-Cholecalciferol (CALCIUM 600+D3) 600-800 MG-UNIT TABS Take by mouth 2 (two) times daily.    . cyclobenzaprine (FLEXERIL) 10 MG tablet TAKE ONE TABLET BY MOUTH THREE TIMES DAILY AS NEEDED FOR MUSCLE SPASMS 270 tablet 1  . diphenhydrAMINE (BENADRYL) 25 mg capsule Take 25 mg by mouth as needed.    . diphenhydrAMINE-zinc acetate (BENADRYL) cream Apply 1 application topically as needed for itching.    . Fish Oil-Cholecalciferol (FISH OIL + D3 PO) Take by mouth 2 (two) times daily.    . furosemide (LASIX) 40 MG tablet Take 1 tablet (40 mg total) by mouth 2 (two) times daily. 180 tablet 1  . hyaluronate sodium (RADIAPLEXRX) GEL Apply 1 application topically 2 (two) times daily.    Marland Kitchen HYDROcodone-acetaminophen (NORCO) 10-325 MG tablet Take 1 tablet by mouth every 6 (six) hours as needed for severe pain. 120 tablet 0  . magnesium hydroxide (MILK OF MAGNESIA) 400 MG/5ML suspension Take 30 mLs by mouth.    . Misc. Devices (NASAL SPRAY BOTTLE) MISC by Does not apply route as needed.    . NON FORMULARY AREDS 2 Take 2 PO QD    . non-metallic deodorant (ALRA) MISC Apply 1 application topically daily as needed.    . pantoprazole (PROTONIX)  40 MG tablet Take 1 tablet (40 mg total) by mouth daily. 90 tablet 3  . potassium chloride (KLOR-CON 10) 10 MEQ tablet Take 2 tablets by mouth daily 180 tablet 3  . senna (SENOKOT) 8.6 MG tablet Take 1 tablet by mouth as needed for constipation.    . simvastatin (ZOCOR) 20 MG tablet Take 1 tablet (20 mg total) by mouth at bedtime. 90 tablet 3  . timolol (TIMOPTIC-XR) 0.5 % ophthalmic gel-forming Place 1 drop into both eyes daily.   3  . TRAVATAN Z 0.004 % SOLN ophthalmic solution Place 1 drop into both eyes at bedtime.   3  . vitamin B-12 (CYANOCOBALAMIN) 1000 MCG tablet Take 1,000 mcg by mouth daily.     No current facility-administered medications for this encounter.      ALLERGIES: Review of patient's allergies indicates no known allergies.   LABORATORY DATA:  Lab Results  Component Value Date   WBC 15.0 (H) 12/10/2015   HGB 13.8 12/10/2015   HCT 40.8 12/10/2015   MCV 101.0 12/10/2015   PLT 210 12/10/2015   Lab Results  Component Value Date   NA 141 12/10/2015   K 3.9 12/10/2015   CL 102 11/21/2015   CO2 28 12/10/2015   Lab Results  Component Value Date   ALT 42 12/10/2015   AST 37 (H) 12/10/2015   ALKPHOS  78 12/10/2015   BILITOT 0.43 12/10/2015     NARRATIVE: Connie Lang was seen today for weekly treatment management. The chart was checked and the patient's films were reviewed.  [name] returns for weekly radiation treatment. Using Radioplex bid. She is on a prednisone taper. Appetite is good. Reports fatigue. Reports she cannot raise her arm up much after having a shot in her right shoulder by her rheumatologist. States this shot has helped enormously.   PHYSICAL EXAMINATION: weight is 203 lb 11.2 oz (92.4 kg). Her oral temperature is 97.8 F (36.6 C). Her blood pressure is 132/56 (abnormal) and her pulse is 103 (abnormal).     Moderate erythema in the treatment area.  ASSESSMENT: The patient is doing satisfactorily with treatment.  PLAN: We will  continue with the patient's radiation treatment as planned.     ------------------------------------------------  Jodelle Gross, MD, PhD  This document serves as a record of services personally performed by Kyung Rudd, MD. It was created on his behalf by Arlyce Harman, a trained medical scribe. The creation of this record is based on the scribe's personal observations and the provider's statements to them. This document has been checked and approved by the attending provider.

## 2016-02-13 NOTE — Progress Notes (Addendum)
weekly rad txs breast 12/17 completed, erythema, skin intact, radiaplex used bid, gave another tube,  On  prednisone  Taper, appetite good, fatigued, can't raise arm up  Much, has djd in right shoulder, 3:06 PM BP (!) 132/56 (BP Location: Left Arm, Patient Position: Sitting, Cuff Size: Normal)   Pulse (!) 103   Temp 97.8 F (36.6 C) (Oral)   Wt 203 lb 11.2 oz (92.4 kg)   BMI 35.52 kg/m   Wt Readings from Last 3 Encounters:  02/13/16 203 lb 11.2 oz (92.4 kg)  02/06/16 203 lb 1.6 oz (92.1 kg)  01/30/16 206 lb (93.4 kg)

## 2016-02-16 ENCOUNTER — Ambulatory Visit
Admission: RE | Admit: 2016-02-16 | Discharge: 2016-02-16 | Disposition: A | Discharge status: Home / Self Care | Payer: Medicare Other | Source: Ambulatory Visit | Attending: Radiation Oncology | Admitting: Radiation Oncology

## 2016-02-16 DIAGNOSIS — Z510 Encounter for antineoplastic radiation therapy: Secondary | ICD-10-CM | POA: Diagnosis not present

## 2016-02-17 ENCOUNTER — Ambulatory Visit
Admission: RE | Admit: 2016-02-17 | Discharge: 2016-02-17 | Disposition: A | Discharge status: Home / Self Care | Payer: Medicare Other | Source: Ambulatory Visit | Attending: Radiation Oncology | Admitting: Radiation Oncology

## 2016-02-17 DIAGNOSIS — Z510 Encounter for antineoplastic radiation therapy: Secondary | ICD-10-CM | POA: Diagnosis not present

## 2016-02-18 ENCOUNTER — Ambulatory Visit
Admission: RE | Admit: 2016-02-18 | Discharge: 2016-02-18 | Disposition: A | Discharge status: Home / Self Care | Payer: Medicare Other | Source: Ambulatory Visit | Attending: Radiation Oncology | Admitting: Radiation Oncology

## 2016-02-18 ENCOUNTER — Ambulatory Visit: Payer: Medicare Other | Admitting: Radiation Oncology

## 2016-02-18 DIAGNOSIS — Z510 Encounter for antineoplastic radiation therapy: Secondary | ICD-10-CM | POA: Diagnosis not present

## 2016-02-19 ENCOUNTER — Ambulatory Visit
Admission: RE | Admit: 2016-02-19 | Discharge: 2016-02-19 | Disposition: A | Discharge status: Home / Self Care | Payer: Medicare Other | Source: Ambulatory Visit | Attending: Radiation Oncology | Admitting: Radiation Oncology

## 2016-02-19 ENCOUNTER — Ambulatory Visit: Payer: Medicare Other | Admitting: Radiation Oncology

## 2016-02-19 DIAGNOSIS — Z510 Encounter for antineoplastic radiation therapy: Secondary | ICD-10-CM | POA: Diagnosis not present

## 2016-02-20 ENCOUNTER — Encounter: Payer: Self-pay | Admitting: Radiation Oncology

## 2016-02-20 ENCOUNTER — Ambulatory Visit
Admission: RE | Admit: 2016-02-20 | Discharge: 2016-02-20 | Disposition: A | Discharge status: Home / Self Care | Payer: Medicare Other | Source: Ambulatory Visit | Attending: Radiation Oncology | Admitting: Radiation Oncology

## 2016-02-20 ENCOUNTER — Ambulatory Visit: Payer: Medicare Other | Admitting: Radiation Oncology

## 2016-02-20 VITALS — BP 128/77 | HR 96 | Temp 99.0°F | Resp 20 | Wt 202.9 lb

## 2016-02-20 DIAGNOSIS — Z510 Encounter for antineoplastic radiation therapy: Secondary | ICD-10-CM | POA: Diagnosis not present

## 2016-02-20 DIAGNOSIS — C50411 Malignant neoplasm of upper-outer quadrant of right female breast: Secondary | ICD-10-CM

## 2016-02-20 NOTE — Progress Notes (Signed)
Department of Radiation Oncology  Phone:  [phone] Fax:        [phone]  Weekly Treatment Note    Name: Connie Lang Date: 02/20/2016 MRN: [id number] DOB: May 15, 1941   Diagnosis:     ICD-9-CM ICD-10-CM   1. Breast cancer of upper-outer quadrant of right female breast ([place]) 174.4 C50.411      Current dose: 42.5 Gy  Current fraction: 17   MEDICATIONS: Current Outpatient Prescriptions  Medication Sig Dispense Refill  . alendronate (FOSAMAX) 70 MG tablet Take 70 mg by mouth once a week. Take with a full glass of water on an empty stomach.    Marland Kitchen amLODipine (NORVASC) 5 MG tablet Take 5 mg by mouth daily.    Marland Kitchen anti-nausea (EMETROL) solution Take 30 mLs by mouth as needed for nausea or vomiting.    . Calcium Carb-Cholecalciferol (CALCIUM 600+D3) 600-800 MG-UNIT TABS Take by mouth 2 (two) times daily.    . cyclobenzaprine (FLEXERIL) 10 MG tablet TAKE ONE TABLET BY MOUTH THREE TIMES DAILY AS NEEDED FOR MUSCLE SPASMS 270 tablet 1  . diphenhydrAMINE (BENADRYL) 25 mg capsule Take 25 mg by mouth as needed.    . diphenhydrAMINE-zinc acetate (BENADRYL) cream Apply 1 application topically as needed for itching.    . Fish Oil-Cholecalciferol (FISH OIL + D3 PO) Take by mouth 2 (two) times daily.    . furosemide (LASIX) 40 MG tablet Take 1 tablet (40 mg total) by mouth 2 (two) times daily. 180 tablet 1  . hyaluronate sodium (RADIAPLEXRX) GEL Apply 1 application topically 2 (two) times daily.    Marland Kitchen HYDROcodone-acetaminophen (NORCO) 10-325 MG tablet Take 1 tablet by mouth every 6 (six) hours as needed for severe pain. 120 tablet 0  . magnesium hydroxide (MILK OF MAGNESIA) 400 MG/5ML suspension Take 30 mLs by mouth.    . Misc. Devices (NASAL SPRAY BOTTLE) MISC by Does not apply route as needed.    . NON FORMULARY AREDS 2 Take 2 PO QD    . non-metallic deodorant (ALRA) MISC Apply 1 application topically daily as needed.    . pantoprazole (PROTONIX) 40 MG tablet Take 1 tablet (40 mg  total) by mouth daily. 90 tablet 3  . potassium chloride (KLOR-CON 10) 10 MEQ tablet Take 2 tablets by mouth daily 180 tablet 3  . senna (SENOKOT) 8.6 MG tablet Take 1 tablet by mouth as needed for constipation.    . simvastatin (ZOCOR) 20 MG tablet Take 1 tablet (20 mg total) by mouth at bedtime. 90 tablet 3  . timolol (TIMOPTIC-XR) 0.5 % ophthalmic gel-forming Place 1 drop into both eyes daily.   3  . TRAVATAN Z 0.004 % SOLN ophthalmic solution Place 1 drop into both eyes at bedtime.   3  . vitamin B-12 (CYANOCOBALAMIN) 1000 MCG tablet Take 1,000 mcg by mouth daily.     No current facility-administered medications for this encounter.      ALLERGIES: Review of patient's allergies indicates no known allergies.   LABORATORY DATA:  Lab Results  Component Value Date   WBC 15.0 (H) 12/10/2015   HGB 13.8 12/10/2015   HCT 40.8 12/10/2015   MCV 101.0 12/10/2015   PLT 210 12/10/2015   Lab Results  Component Value Date   NA 141 12/10/2015   K 3.9 12/10/2015   CL 102 11/21/2015   CO2 28 12/10/2015   Lab Results  Component Value Date   ALT 42 12/10/2015   AST 37 (H) 12/10/2015   ALKPHOS 78 12/10/2015  BILITOT 0.43 12/10/2015     NARRATIVE: JIMMYE WISNIESKI was seen today for weekly treatment management. The chart was checked and the patient's films were reviewed.  Weekly rad txs right breat 17/20 completed, under erythema raw looking thinning, but intact,no itching, dermatitgis on mid chest breast area, appetite good,  No c/o except tenderness under axilla,"a little Touchy"  1 month f/u appt with Shona Simpson 04/13/16 BP 128/77 (BP Location: Right Arm, Patient Position: Sitting, Cuff Size: Normal)   Pulse 96   Temp 99 F (37.2 C) (Oral)   Resp 20   Wt 202 lb 14.4 oz (92 kg)   BMI 35.38 kg/m   Wt Readings from Last 3 Encounters:  02/20/16 202 lb 14.4 oz (92 kg)  02/13/16 203 lb 11.2 oz (92.4 kg)  02/06/16 203 lb 1.6 oz (92.1 kg)   BP 128/77 (BP Location: Right Arm,  Patient Position: Sitting, Cuff Size: Normal)   Pulse 96   Temp 99 F (37.2 C) (Oral)   Resp 20   Wt 202 lb 14.4 oz (92 kg)   BMI 35.38 kg/m   PHYSICAL EXAMINATION: weight is 202 lb 14.4 oz (92 kg). Her oral temperature is 99 F (37.2 C). Her blood pressure is 128/77 and her pulse is 96. Her respiration is 20.      The patient's skin shows erythema diffusely without significant desquamation  ASSESSMENT: The patient is doing satisfactorily with treatment.  PLAN: We will continue with the patient's radiation treatment as planned. Patient will finish her course of radiation treatment next week and then will follow-up in our clinic in 1 month.

## 2016-02-20 NOTE — Progress Notes (Addendum)
Weekly rad txs right breat 17/20 completed, under erythema raw looking thinning, but intact,no itching, dermatitgis on mid chest breast area, appetite good,  No c/o except tenderness under axilla,"a little Touchy"  1 month f/u appt with Connie Lang 04/13/16 There were no vitals taken for this visit.  Wt Readings from Last 3 Encounters:  02/13/16 203 lb 11.2 oz (92.4 kg)  02/06/16 203 lb 1.6 oz (92.1 kg)  01/30/16 206 lb (93.4 kg)   BP 128/77 (BP Location: Right Arm, Patient Position: Sitting, Cuff Size: Normal)   Pulse 96   Temp 99 F (37.2 C) (Oral)   Resp 20   Wt 202 lb 14.4 oz (92 kg)   BMI 35.38 kg/m

## 2016-02-20 NOTE — Progress Notes (Signed)
  Radiation Oncology         [phone] ________________________________  Name: Connie Lang MRN: [id number]  Date: 02/11/2016  DOB: Aug 18, 1940  Complex simulation note  The patient has undergone complex simulation for her upcoming boost treatment for her diagnosis of breast cancer. The patient has initially been planned to receive 42.5 Gy. The patient will now receive a 7.5 Gy boost to the seroma cavity which has been contoured. This will be accomplished using an en face electron field. Based on the depth of the target area, 15 MeV electrons will be used. The patient's final total dose therefore will be 50 Gy. A complex isodose plan from the electron [facility] Carlo calculation is requested for the boost treatment.   _______________________________  Jodelle Gross, MD, PhD

## 2016-02-23 ENCOUNTER — Ambulatory Visit
Admission: RE | Admit: 2016-02-23 | Discharge: 2016-02-23 | Disposition: A | Discharge status: Home / Self Care | Payer: Medicare Other | Source: Ambulatory Visit | Attending: Radiation Oncology | Admitting: Radiation Oncology

## 2016-02-23 DIAGNOSIS — Z510 Encounter for antineoplastic radiation therapy: Secondary | ICD-10-CM | POA: Diagnosis not present

## 2016-02-24 ENCOUNTER — Ambulatory Visit
Admission: RE | Admit: 2016-02-24 | Discharge: 2016-02-24 | Disposition: A | Discharge status: Home / Self Care | Payer: Medicare Other | Source: Ambulatory Visit | Attending: Radiation Oncology | Admitting: Radiation Oncology

## 2016-02-24 DIAGNOSIS — Z510 Encounter for antineoplastic radiation therapy: Secondary | ICD-10-CM | POA: Diagnosis not present

## 2016-02-25 ENCOUNTER — Encounter: Payer: Self-pay | Admitting: Radiation Oncology

## 2016-02-25 ENCOUNTER — Encounter: Payer: Self-pay | Admitting: Family Medicine

## 2016-02-25 ENCOUNTER — Ambulatory Visit
Admission: RE | Admit: 2016-02-25 | Discharge: 2016-02-25 | Disposition: A | Discharge status: Home / Self Care | Payer: Medicare Other | Source: Ambulatory Visit | Attending: Radiation Oncology | Admitting: Radiation Oncology

## 2016-02-25 DIAGNOSIS — Z510 Encounter for antineoplastic radiation therapy: Secondary | ICD-10-CM | POA: Diagnosis not present

## 2016-02-26 ENCOUNTER — Encounter: Payer: Self-pay | Admitting: Family Medicine

## 2016-02-26 DIAGNOSIS — Z0000 Encounter for general adult medical examination without abnormal findings: Secondary | ICD-10-CM

## 2016-02-27 ENCOUNTER — Telehealth: Payer: Self-pay | Admitting: *Deleted

## 2016-02-27 NOTE — Telephone Encounter (Signed)
  Oncology Nurse Navigator Documentation    Navigator Encounter Type: Telephone (02/27/16 1400) Telephone: Outgoing Call (02/27/16 1400)         Patient Visit Type: NXGZFP (02/27/16 1400) Treatment Phase: Final Radiation Tx (02/27/16 1400) Barriers/Navigation Needs: No barriers at this time;No Questions;No Needs (02/27/16 1400)   Interventions: None required (02/27/16 1400)                      Time Spent with Patient: 15 (02/27/16 1400)

## 2016-03-02 ENCOUNTER — Encounter: Payer: Self-pay | Admitting: Family Medicine

## 2016-03-02 MED ORDER — HYDROCODONE-ACETAMINOPHEN 10-325 MG PO TABS: 1.0000 | ORAL_TABLET | ORAL | 0 refills | Status: DC | PRN

## 2016-03-02 NOTE — Telephone Encounter (Signed)
I wrote for Norco #[id number] and I also put in a referral to GI for a colonoscopy

## 2016-03-03 ENCOUNTER — Encounter: Payer: Self-pay | Admitting: Gastroenterology

## 2016-03-04 ENCOUNTER — Telehealth: Payer: Self-pay | Admitting: *Deleted

## 2016-03-04 NOTE — Telephone Encounter (Signed)
Dr Silverio Decamp,  This patient is scheduled with you for a direct colon 9-15 Friday. She has past GI hx of a colon 02-14-2006 in [place] with a Dr Shane Crutch that was normal per pt ( we have no report) . She is 75 years old and was dx'd with right BR CA this year, she had right lumpectomy 12-25-2015 and had radiation tx ( last 02-25-2016).    She has a hx of HTN, glaucoma, RBBB, HTN, FHCC.  Is she okay to proceed with her colon 9-15 or does she need to wait s/p radiation.  Please advise, Thanks, Marijean Niemann

## 2016-03-04 NOTE — Telephone Encounter (Signed)
Spoke with patient and she states the prep will not be an issue for her and she is anxious to have her colon 9-15. Instructed we will leave as scheduled and discuss more tomorrow at her PV  Connie Lang PV

## 2016-03-04 NOTE — Telephone Encounter (Signed)
Given her radiation treatment is for breast cancer, wouldn't interfere with the procedure but patient may have some difficulty with drinking the bowel prep. We can consider postponing colonoscopy for next 2-3 months

## 2016-03-05 ENCOUNTER — Ambulatory Visit (AMBULATORY_SURGERY_CENTER): Payer: Self-pay | Admitting: *Deleted

## 2016-03-05 ENCOUNTER — Encounter: Payer: Self-pay | Admitting: Gastroenterology

## 2016-03-05 VITALS — Ht 64.0 in | Wt 200.0 lb

## 2016-03-05 DIAGNOSIS — Z800 Family history of malignant neoplasm of digestive organs: Secondary | ICD-10-CM

## 2016-03-05 DIAGNOSIS — Z1211 Encounter for screening for malignant neoplasm of colon: Secondary | ICD-10-CM

## 2016-03-05 MED ORDER — NA SULFATE-K SULFATE-MG SULF 17.5-3.13-1.6 GM/177ML PO SOLN: 1.0000 | Freq: Once | ORAL | 0 refills | Status: AC

## 2016-03-05 NOTE — Progress Notes (Signed)
No egg or soy allergy known to patient  No issues with past sedation with any surgeries  or procedures, no intubation problems  No diet pills per patient No home 02 use per patient  No blood thinners per patient  Pt states  issues with constipation since a child- uses MOM daily, keeps stools soft  No A fib or A flutter  TE to [facility] 8-31 about radiation that ended 8-23- per KN may need to wait so pt cam tolerate prep- pt chooses not to wait KN states no issues with the colon with breast cancer Pt is in need of a right hip replacement- uses walker at this time until it can get done   Records release to robin stallings CMA for nandigam to get old colon records

## 2016-03-10 ENCOUNTER — Other Ambulatory Visit: Payer: Self-pay | Admitting: *Deleted

## 2016-03-10 DIAGNOSIS — C50411 Malignant neoplasm of upper-outer quadrant of right female breast: Secondary | ICD-10-CM

## 2016-03-12 NOTE — Progress Notes (Signed)
  Radiation Oncology         [phone] ________________________________  Name: Connie Lang MRN: [id number]  Date: 02/25/2016  DOB: Jan 04, 1941  End of Treatment Note  Diagnosis:   Right-sided breast cancer     Indication for treatment:  Curative       Radiation treatment dates:   01/29/2016 through 02/25/2016  Site/dose:   The patient initially received a dose of 42.5 Gy in 17 fractions to the breast using whole-breast tangent fields. This was delivered using a 3-D conformal technique. The patient then received a boost to the seroma. This delivered an additional 7.5 Gy in 3 fractions using an en face electron field due to the depth of the seroma. The total dose was 50 Gy.  Narrative: The patient tolerated radiation treatment relatively well.   The patient had some expected skin irritation as she progressed during treatment. Moist desquamation was not present at the end of treatment.  Plan: The patient has completed radiation treatment. The patient will return to radiation oncology clinic for routine followup in one month. I advised the patient to call or return sooner if they have any questions or concerns related to their recovery or treatment. ________________________________  Jodelle Gross, M.D., Ph.D.

## 2016-03-19 ENCOUNTER — Encounter: Payer: Self-pay | Admitting: Gastroenterology

## 2016-03-19 ENCOUNTER — Ambulatory Visit (AMBULATORY_SURGERY_CENTER): Payer: Medicare Other | Admitting: Gastroenterology

## 2016-03-19 VITALS — BP 140/58 | HR 80 | Temp 97.5°F | Resp 15 | Ht 64.0 in | Wt 200.0 lb

## 2016-03-19 DIAGNOSIS — K621 Rectal polyp: Secondary | ICD-10-CM | POA: Diagnosis not present

## 2016-03-19 DIAGNOSIS — E669 Obesity, unspecified: Secondary | ICD-10-CM | POA: Diagnosis not present

## 2016-03-19 DIAGNOSIS — D123 Benign neoplasm of transverse colon: Secondary | ICD-10-CM

## 2016-03-19 DIAGNOSIS — D129 Benign neoplasm of anus and anal canal: Secondary | ICD-10-CM

## 2016-03-19 DIAGNOSIS — Z86010 Personal history of colonic polyps: Secondary | ICD-10-CM | POA: Diagnosis present

## 2016-03-19 DIAGNOSIS — D128 Benign neoplasm of rectum: Secondary | ICD-10-CM

## 2016-03-19 DIAGNOSIS — Z800 Family history of malignant neoplasm of digestive organs: Secondary | ICD-10-CM | POA: Diagnosis not present

## 2016-03-19 DIAGNOSIS — D122 Benign neoplasm of ascending colon: Secondary | ICD-10-CM | POA: Diagnosis not present

## 2016-03-19 DIAGNOSIS — D120 Benign neoplasm of cecum: Secondary | ICD-10-CM

## 2016-03-19 DIAGNOSIS — I10 Essential (primary) hypertension: Secondary | ICD-10-CM | POA: Diagnosis not present

## 2016-03-19 DIAGNOSIS — Z1211 Encounter for screening for malignant neoplasm of colon: Secondary | ICD-10-CM

## 2016-03-19 HISTORY — PX: COLONOSCOPY: SHX174

## 2016-03-19 MED ORDER — SODIUM CHLORIDE 0.9 % IV SOLN: 500.0000 mL | INTRAVENOUS | Status: DC

## 2016-03-19 NOTE — Patient Instructions (Signed)
YOU HAD AN ENDOSCOPIC PROCEDURE TODAY AT [facility]:   Refer to the procedure report that was given to you for any specific questions about what was found during the examination.  If the procedure report does not answer your questions, please call your gastroenterologist to clarify.  If you requested that your care partner not be given the details of your procedure findings, then the procedure report has been included in a sealed envelope for you to review at your convenience later.  YOU SHOULD EXPECT: Some feelings of bloating in the abdomen. Passage of more gas than usual.  Walking can help get rid of the air that was put into your GI tract during the procedure and reduce the bloating. If you had a lower endoscopy (such as a colonoscopy or flexible sigmoidoscopy) you may notice spotting of blood in your stool or on the toilet paper. If you underwent a bowel prep for your procedure, you may not have a normal bowel movement for a few days.  Please Note:  You might notice some irritation and congestion in your nose or some drainage.  This is from the oxygen used during your procedure.  There is no need for concern and it should clear up in a day or so.  SYMPTOMS TO REPORT IMMEDIATELY:   Following lower endoscopy (colonoscopy or flexible sigmoidoscopy):  Excessive amounts of blood in the stool  Significant tenderness or worsening of abdominal pains  Swelling of the abdomen that is new, acute  Fever of 100F or higher   Following upper endoscopy (EGD)  Vomiting of blood or coffee ground material  New chest pain or pain under the shoulder blades  Painful or persistently difficult swallowing  New shortness of breath  Fever of 100F or higher  Black, tarry-looking stools  For urgent or emergent issues, a gastroenterologist can be reached at any hour by calling [phone].   DIET:  We do recommend a small meal at first, but then you may proceed to your regular diet.  Drink  plenty of fluids but you should avoid alcoholic beverages for 24 hours.  ACTIVITY:  You should plan to take it easy for the rest of today and you should NOT DRIVE or use heavy machinery until tomorrow (because of the sedation medicines used during the test).    FOLLOW UP: Our staff will call the number listed on your records the next business day following your procedure to check on you and address any questions or concerns that you may have regarding the information given to you following your procedure. If we do not reach you, we will leave a message.  However, if you are feeling well and you are not experiencing any problems, there is no need to return our call.  We will assume that you have returned to your regular daily activities without incident.  If any biopsies were taken you will be contacted by phone or by letter within the next 1-3 weeks.  Please call us at [phone] if you have not heard about the biopsies in 3 weeks.    SIGNATURES/CONFIDENTIALITY: You and/or your care partner have signed paperwork which will be entered into your electronic medical record.  These signatures attest to the fact that that the information above on your After Visit Summary has been reviewed and is understood.  Full responsibility of the confidentiality of this discharge information lies with you and/or your care-partner.  See Dr. Silverio Decamp  October 9 at 3:00PM.  Call [facility]  if you can't make this appointment.  Polyp, diverticulosis, and high fiber diet information.

## 2016-03-19 NOTE — Progress Notes (Signed)
Pt ekg shows SR w/BBB, occasional blocked pac vs. Mobitz one. Dr Providence Crosby aware.

## 2016-03-19 NOTE — Op Note (Signed)
[facility] Patient Name: Connie Lang Procedure Date: 03/19/2016 3:27 PM MRN: [id number] Endoscopist: Mauri Pole , MD Age: 75 Referring MD:  Date of Birth: 10-Oct-1940 Gender: Female Account #: [id number] Procedure:                Colonoscopy Indications:              Screening in patient at increased risk: Family                            history of 1st-degree relative with colorectal                            cancer before age 73 years, High risk colon cancer                            surveillance: Personal history of colonic polyps Medicines:                Monitored Anesthesia Care Procedure:                Pre-Anesthesia Assessment:                           - Prior to the procedure, a History and Physical                            was performed, and patient medications and                            allergies were reviewed. The patient's tolerance of                            previous anesthesia was also reviewed. The risks                            and benefits of the procedure and the sedation                            options and risks were discussed with the patient.                            All questions were answered, and informed consent                            was obtained. Prior Anticoagulants: The patient has                            taken no previous anticoagulant or antiplatelet                            agents. ASA Grade Assessment: III - A patient with                            severe systemic disease. After reviewing the risks  and benefits, the patient was deemed in                            satisfactory condition to undergo the procedure.                           After obtaining informed consent, the colonoscope                            was passed under direct vision. Throughout the                            procedure, the patient's blood pressure, pulse, and                            oxygen  saturations were monitored continuously. The                            Model CF-HQ190L [id number]) scope was introduced                            through the anus and advanced to the the terminal                            ileum, with identification of the appendiceal                            orifice and IC valve. The colonoscopy was performed                            without difficulty. The patient tolerated the                            procedure well. The quality of the bowel                            preparation was fair. The terminal ileum, ileocecal                            valve, appendiceal orifice, and rectum were                            photographed. Scope In: 3:41:15 PM Scope Out: 4:23:54 PM Scope Withdrawal Time: 0 hours 33 minutes 13 seconds  Total Procedure Duration: 0 hours 42 minutes 39 seconds  Findings:                 The perianal and digital rectal examinations were                            normal.                           Three semi-pedunculated polyps were found in the  ascending colon and cecum. The polyps were 9 to 18                            mm in size. These polyps were removed with a hot                            snare. Resection and retrieval were complete.                           Two sessile polyps were found in the transverse                            colon and ascending colon. The polyps were 3 to 4                            mm in size. These polyps were removed with a cold                            biopsy forceps. Resection and retrieval were                            complete.                           Three hyperplastic and sessile polyps were found in                            the rectum. The polyps were 4 to 6 mm in size.                            These polyps were removed with a cold snare.                            Resection and retrieval were complete.                           Multiple small  and large-mouthed diverticula were                            found in the sigmoid colon and descending colon.                           Non-bleeding internal hemorrhoids were found during                            retroflexion. The hemorrhoids were small. Complications:            No immediate complications. Estimated Blood Loss:     Estimated blood loss was minimal. Impression:               - Preparation of the colon was fair.                           - Three 9 to 18  mm polyps in the ascending colon                            and in the cecum, removed with a hot snare.                            Resected and retrieved.                           - Two 3 to 4 mm polyps in the transverse colon and                            in the ascending colon, removed with a cold biopsy                            forceps. Resected and retrieved.                           - Three 4 to 6 mm polyps in the rectum, removed                            with a cold snare. Resected and retrieved.                           - Diverticulosis in the sigmoid colon and in the                            descending colon.                           - Non-bleeding internal hemorrhoids. Recommendation:           - Patient has a contact number available for                            emergencies. The signs and symptoms of potential                            delayed complications were discussed with the                            patient. Return to normal activities tomorrow.                            Written discharge instructions were provided to the                            patient.                           - Resume previous diet.                           - Continue present medications.                           -  Await pathology results.                           - Repeat colonoscopy in 1 year because the bowel                            preparation was suboptimal. Mauri Pole, MD 03/19/2016 4:32:39  PM This report has been signed electronically.

## 2016-03-19 NOTE — Progress Notes (Signed)
To recovery, report to Scott, RN, VSS 

## 2016-03-19 NOTE — Progress Notes (Signed)
Called to room for pathology and to apply abd pressure.

## 2016-03-22 ENCOUNTER — Telehealth: Payer: Self-pay | Admitting: *Deleted

## 2016-03-22 NOTE — Telephone Encounter (Signed)
  Follow up Call-  Call back number 03/19/2016  Post procedure Call Back phone  # [phone] home  Permission to leave phone message Yes  Some recent data might be hidden     Patient questions:  Do you have a fever, pain , or abdominal swelling? No. Pain Score  0 *  Have you tolerated food without any problems? Yes.    Have you been able to return to your normal activities? Yes  Do you have any questions about your discharge instructions: Diet   No. Medications  No. Follow up visit  No.  Do you have questions or concerns about your Care? No.  Actions: * If pain score is 4 or above: No action needed, pain <4.

## 2016-03-26 ENCOUNTER — Ambulatory Visit (INDEPENDENT_AMBULATORY_CARE_PROVIDER_SITE_OTHER): Payer: Medicare Other | Admitting: Family Medicine

## 2016-03-26 DIAGNOSIS — Z23 Encounter for immunization: Secondary | ICD-10-CM

## 2016-03-30 ENCOUNTER — Encounter: Payer: Self-pay | Admitting: Gastroenterology

## 2016-04-01 ENCOUNTER — Ambulatory Visit: Payer: Medicare Other | Admitting: Gastroenterology

## 2016-04-05 ENCOUNTER — Telehealth: Payer: Self-pay | Admitting: Family Medicine

## 2016-04-05 ENCOUNTER — Encounter: Payer: Self-pay | Admitting: Family Medicine

## 2016-04-05 DIAGNOSIS — M25552 Pain in left hip: Principal | ICD-10-CM

## 2016-04-05 DIAGNOSIS — M25551 Pain in right hip: Secondary | ICD-10-CM

## 2016-04-05 NOTE — Telephone Encounter (Signed)
Patient requesting a refill on HYDROcodone-acetaminophen (NORCO) 10-325 MG tablet [[id number]]

## 2016-04-06 NOTE — Telephone Encounter (Signed)
Referral was done  

## 2016-04-07 MED ORDER — HYDROCODONE-ACETAMINOPHEN 10-325 MG PO TABS: 1.0000 | ORAL_TABLET | ORAL | 0 refills | Status: DC | PRN

## 2016-04-07 NOTE — Telephone Encounter (Signed)
done

## 2016-04-08 NOTE — Telephone Encounter (Signed)
Script is ready for pick up here at front office and I spoke with pt.  

## 2016-04-12 ENCOUNTER — Ambulatory Visit (INDEPENDENT_AMBULATORY_CARE_PROVIDER_SITE_OTHER): Payer: Medicare Other | Admitting: Gastroenterology

## 2016-04-12 ENCOUNTER — Encounter: Payer: Self-pay | Admitting: Gastroenterology

## 2016-04-12 VITALS — BP 120/68 | HR 80 | Ht 64.0 in | Wt 194.0 lb

## 2016-04-12 DIAGNOSIS — R140 Abdominal distension (gaseous): Secondary | ICD-10-CM | POA: Diagnosis not present

## 2016-04-12 DIAGNOSIS — K219 Gastro-esophageal reflux disease without esophagitis: Secondary | ICD-10-CM | POA: Diagnosis not present

## 2016-04-12 DIAGNOSIS — K5900 Constipation, unspecified: Secondary | ICD-10-CM

## 2016-04-12 MED ORDER — LINACLOTIDE 72 MCG PO CAPS: 72.0000 ug | ORAL_CAPSULE | Freq: Every day | ORAL | 3 refills | Status: DC

## 2016-04-12 NOTE — Patient Instructions (Signed)
We have sent in [place] to your pharmacy  Use Phazyme 1 capsule three times a day as needed  Continue to use Protonix for GERD  Your recall colonoscopy is in 03/2017, we will mail you a reminder letter

## 2016-04-12 NOTE — Progress Notes (Signed)
Connie Lang    [id number]    [birth year]  Primary Care Physician:FRY,STEPHEN A, MD  Referring Physician: Laurey Morale, MD [place], [place] [id number]  Chief complaint:  Constipation  HPI:  75 year old female with history of breast cancer, polymyalgia rheumatica here for follow up visit. She was last seen  03/19/16 for colonoscopy with removal of multiple colon polyps (Tubulovillous adenoma and tubular adenoma). She was on chronic prednisone and it has been tapered off with no recurrent symptoms of PMR  Patient is doing well overall since she's been off prednisone. She c/o constipation and bloating. Intermittent heartburn controlled with Protonix '40mg'$  daily. Denies any nausea, vomiting, abdominal pain, melena or bright red blood per rectum    Outpatient Encounter Prescriptions as of 04/12/2016  Medication Sig  . acetaminophen (TYLENOL) 500 MG tablet Take 500 mg by mouth every 6 (six) hours as needed.  Marland Kitchen amLODipine (NORVASC) 5 MG tablet Take 5 mg by mouth daily.  Marland Kitchen anti-nausea (EMETROL) solution Take 30 mLs by mouth as needed for nausea or vomiting.  . Calcium Carb-Cholecalciferol (CALCIUM 600+D3) 600-800 MG-UNIT TABS Take by mouth 2 (two) times daily.  . Chlorpheniramine-DM 4-30 MG TABS Take by mouth.  . cyclobenzaprine (FLEXERIL) 10 MG tablet TAKE ONE TABLET BY MOUTH THREE TIMES DAILY AS NEEDED FOR MUSCLE SPASMS  . diphenhydrAMINE (BENADRYL) 25 mg capsule Take 25 mg by mouth as needed.  . diphenhydrAMINE-zinc acetate (BENADRYL) cream Apply 1 application topically as needed for itching.  . Fish Oil-Cholecalciferol (FISH OIL + D3 PO) Take by mouth 2 (two) times daily.  . furosemide (LASIX) 40 MG tablet Take 1 tablet (40 mg total) by mouth 2 (two) times daily.  Marland Kitchen HYDROcodone-acetaminophen (NORCO) 10-325 MG tablet Take 1 tablet by mouth every 4 (four) hours as needed for severe pain.  . Ibuprofen (ADVIL) 200 MG CAPS Take 1 capsule by mouth as needed.   . magnesium hydroxide (MILK OF MAGNESIA) 400 MG/5ML suspension Take 30 mLs by mouth.  . Misc. Devices (NASAL SPRAY BOTTLE) MISC by Does not apply route as needed.  . NON FORMULARY AREDS 2 Take 2 PO QD  . OVER THE COUNTER MEDICATION Mucus relief dextromethorphan Hbr 20 mg, guaifenisin 400 mg  . pantoprazole (PROTONIX) 40 MG tablet Take 1 tablet (40 mg total) by mouth daily.  . potassium chloride (KLOR-CON 10) 10 MEQ tablet Take 2 tablets by mouth daily  . simvastatin (ZOCOR) 20 MG tablet Take 1 tablet (20 mg total) by mouth at bedtime.  . timolol (TIMOPTIC-XR) 0.5 % ophthalmic gel-forming Place 1 drop into both eyes daily.   . TRAVATAN Z 0.004 % SOLN ophthalmic solution Place 1 drop into both eyes at bedtime.   . vitamin B-12 (CYANOCOBALAMIN) 1000 MCG tablet Take 1,000 mcg by mouth daily.  Marland Kitchen linaclotide (LINZESS) 72 MCG capsule Take 1 capsule (72 mcg total) by mouth daily before breakfast.  . [DISCONTINUED] alendronate (FOSAMAX) 70 MG tablet Take 70 mg by mouth once a week. Take with a full glass of water on an empty stomach.  . [DISCONTINUED] senna (SENOKOT) 8.6 MG tablet Take 1 tablet by mouth as needed for constipation.   Facility-Administered Encounter Medications as of 04/12/2016  Medication  . 0.9 %  sodium chloride infusion    Allergies as of 04/12/2016  . (No Known Allergies)    Past Medical History:  Diagnosis Date  . Allergy    occasionally has hayfever  .  Anxiety   . Breast cancer ([facility]) 2017  . Cancer ([facility]) 1987   cancerous cells in vaginal wall seemed to be metastatic from the gut but no primary was ever found   . Chicken pox   . Depression   . DJD (degenerative joint disease)   . GERD (gastroesophageal reflux disease)   . Glaucoma    glaucoma and cataracts sees Dr Satira Sark   . Hyperlipemia   . Hypertension   . Lumbar disc disease    sees Dr. Sherwood Gambler   . Osteoarthritis   . Osteopenia   . PMR (polymyalgia rheumatica) ([facility])    sees Dr. Leigh Aurora   .  Radiation    02-25-16 last radiation for breast cancer  . Transfusion history    blood     Past Surgical History:  Procedure Laterality Date  . ABDOMINAL HYSTERECTOMY     TAH BSO 1985  . APPENDECTOMY    . BASAL CELL CARCINOMA EXCISION     nose and rt thigh  . BREAST BIOPSY      x 4  . BREAST LUMPECTOMY WITH RADIOACTIVE SEED LOCALIZATION Right 12/25/2015   Procedure: RIGHT BREAST LUMPECTOMY WITH RADIOACTIVE SEED LOCALIZATION;  Surgeon: Rolm Bookbinder, MD;  Location: [facility];  Service: General;  Laterality: Right;  . CATARACT EXTRACTION     01-27-2011 left, 02-17-2011 right   . CERVICAL LAMINECTOMY     1983  . cervical microdiskectomy     1990 Dr Jovita Gamma  . CESAREAN SECTION    . COLONOSCOPY  02/14/2006   in [place], clear, repeat in 10 yrs - 3 colons in the past 1 with polyps that were benign- last colon no polyps   . FOOT ARTHRODESIS, MODIFIED MCBRIDE    . LUMBAR LAMINECTOMY     1990 Dr Jovita Gamma  . MYELOGRAM    . POLYPECTOMY    . REFRACTIVE SURGERY     01-29-2009 04-07-2009 Dr Foye Clock in [place]  . spinal injection     07-30-2012, 09-24-2012  . TENDON REPAIR     rt wrist  . VAGINAL BIRTH AFTER CESAREAN SECTION     x3    Family History  Problem Relation Age of Onset  . Alzheimer's disease Father   . Heart failure Father     d. 9  . Stroke Mother 9  . Cancer Sister     ovarian  . Alzheimer's disease Paternal Aunt   . Breast cancer Paternal Aunt   . Colon cancer Brother     dx. 96s  . Stroke Maternal Aunt     d. 85s  . Heart disease Maternal Grandfather   . Diabetes Maternal Grandfather   . Stroke Paternal Grandmother 73  . Heart attack Paternal Grandfather     d. 56s  . Ovarian cancer Sister     dx. 87s; no genetic testing  . Leukemia Grandchild 21  . Skin cancer Grandchild   . Cervical cancer Cousin 71    maternal 1st cousin  . Alzheimer's disease Paternal Aunt     (x4) paternal aunts  . Heart attack Paternal  Uncle 65  . Colon polyps Neg Hx   . Rectal cancer Neg Hx   . Stomach cancer Neg Hx     Social History   Social History  . Marital status: Married    Spouse name: N/A  . Number of children: N/A  . Years of education: N/A   Occupational History  . Not on  file.   Social History Main Topics  . Smoking status: Former Smoker    Packs/day: 0.50    Years: 36.00    Types: Cigarettes    Quit date: 07/06/1995  . Smokeless tobacco: Never Used  . Alcohol use No     Comment: no wine at present   . Drug use: No  . Sexual activity: Not on file   Other Topics Concern  . Not on file   Social History Narrative  . No narrative on file      Review of systems: Review of Systems  Constitutional: Negative for fever and chills.  HENT: Negative.   Eyes: Negative for blurred vision.  Respiratory: Negative for cough, shortness of breath and wheezing.   Cardiovascular: Negative for chest pain and palpitations.  Gastrointestinal: as per HPI Genitourinary: Negative for dysuria, urgency, frequency and hematuria.  Musculoskeletal:Positive for myalgias, back pain and joint pain.  Skin: Negative for itching and rash.  Neurological: Negative for dizziness, tremors, focal weakness, seizures and loss of consciousness.  Psychiatric/Behavioral: Negative for depression, suicidal ideas and hallucinations.  All other systems reviewed and are negative.   Physical Exam: Vitals:   04/12/16 1532  BP: 120/68  Pulse: 80   Body mass index is 33.3 kg/m. Gen:      No acute distress HEENT:  EOMI, sclera anicteric Neck:     No masses; no thyromegaly Lungs:    Clear to auscultation bilaterally; normal respiratory effort CV:         Regular rate and rhythm; no murmurs Abd:      + bowel sounds; soft, non-tender; no palpable masses, no distension Ext:    No edema; adequate peripheral perfusion Skin:      Warm and dry; no rash Neuro: alert and oriented x 3 Psych: normal mood and affect  Data  Reviewed:  Reviewed labs, radiology imaging, old records and pertinent past GI work up   Assessment and Plan/Recommendations: 54 yr F with h/o Polymyalgia rheumatic and chronic GERD here for follow up with c/o constipation Patient is on poly pharmacy and also on narcotics which is likely making constipation worse. Will start Linzess 72 mcg daily and titrate dose based on response Increase dietary fluid and fiber intake Excessive bloating: Okay to use Phazyme 1 capsule 3 times daily as needed GERD: Continue Protonix daily Anti reflux measures Due for surveillance colonoscopy in September 2018 Follow up as needed  15 minutes was spent face-to-face with the patient. Greater than 50% of the time used for counseling as well as treatment plan and follow-up. She had multiple questions which were answered to her satisfaction  Damaris Hippo , MD [phone] Mon-Fri 8a-5p [phone] after 5p, weekends, holidays  CC: Laurey Morale, MD

## 2016-04-13 ENCOUNTER — Ambulatory Visit
Admission: RE | Admit: 2016-04-13 | Discharge: 2016-04-13 | Disposition: A | Discharge status: Home / Self Care | Payer: Medicare Other | Source: Ambulatory Visit | Attending: Radiation Oncology | Admitting: Radiation Oncology

## 2016-04-13 ENCOUNTER — Encounter: Payer: Self-pay | Admitting: Radiation Oncology

## 2016-04-13 VITALS — BP 121/83 | HR 98 | Temp 98.9°F | Ht 64.0 in | Wt 194.2 lb

## 2016-04-13 DIAGNOSIS — D0511 Intraductal carcinoma in situ of right breast: Secondary | ICD-10-CM | POA: Diagnosis not present

## 2016-04-13 DIAGNOSIS — Z79899 Other long term (current) drug therapy: Secondary | ICD-10-CM | POA: Insufficient documentation

## 2016-04-13 DIAGNOSIS — Z85828 Personal history of other malignant neoplasm of skin: Secondary | ICD-10-CM | POA: Diagnosis not present

## 2016-04-13 DIAGNOSIS — Z171 Estrogen receptor negative status [ER-]: Secondary | ICD-10-CM | POA: Diagnosis not present

## 2016-04-13 NOTE — Progress Notes (Signed)
Connie Lang here for reassessment S/P XRT to her right breast. She reports intermittent shooting pains in her right breast.  Note mild erythema of her breast with intact skin throughout the tx field and reports fatigue. Survivorship 07/23/16.  BP 121/83   Pulse 98   Temp 98.9 F (37.2 C) (Oral)   Ht '5\' 4"'$  (1.626 m)   Wt 194 lb 3.2 oz (88.1 kg)   BMI 33.33 kg/m    Wt Readings from Last 3 Encounters:  04/13/16 194 lb 3.2 oz (88.1 kg)  04/12/16 194 lb (88 kg)  03/19/16 200 lb (90.7 kg)

## 2016-04-13 NOTE — Progress Notes (Signed)
Radiation Oncology         [phone] ________________________________  Name: Connie Lang MRN: [id number]  Date: 04/13/2016  DOB: 11-01-1940  Post Treatment Note  CC: Laurey Morale, MD  Rolm Bookbinder, MD  Diagnosis:  ER/PR negative DCIS of the right breast.  Interval Since Last Radiation:  7 weeks   01/29/2016 through 02/25/2016: The patient initially received a dose of 42.5 Gy in 17 fractions to the breast using whole-breast tangent fields. This was delivered using a 3-D conformal technique. The patient then received a boost to the seroma. This delivered an additional 7.5 Gy in 3 fractions using an en face electron field due to the depth of the seroma. The total dose was 50 Gy.   Narrative:  The patient returns today for routine follow-up.  She tolerated radiotherapy well without incident.                              On review of systems, the patient states she is doing well overall. She denies any concerns with her breast, but has noticed increasing sites of thickening and raised skin lesions in her abdomen, and along the upper part of her chest. She has a history of basal cell carcinoma resected on her nose and thigh. No other complaints are verbalized.  ALLERGIES:  has No Known Allergies.  Meds: Current Outpatient Prescriptions  Medication Sig Dispense Refill  . acetaminophen (TYLENOL) 500 MG tablet Take 500 mg by mouth every 6 (six) hours as needed.    Marland Kitchen amLODipine (NORVASC) 5 MG tablet Take 5 mg by mouth daily.    Marland Kitchen anti-nausea (EMETROL) solution Take 30 mLs by mouth as needed for nausea or vomiting.    . Calcium Carb-Cholecalciferol (CALCIUM 600+D3) 600-800 MG-UNIT TABS Take by mouth 2 (two) times daily.    . Chlorpheniramine-DM 4-30 MG TABS Take by mouth.    . cyclobenzaprine (FLEXERIL) 10 MG tablet TAKE ONE TABLET BY MOUTH THREE TIMES DAILY AS NEEDED FOR MUSCLE SPASMS 270 tablet 1  . diphenhydrAMINE (BENADRYL) 25 mg capsule Take 25 mg by mouth as needed.      . diphenhydrAMINE-zinc acetate (BENADRYL) cream Apply 1 application topically as needed for itching.    . Fish Oil-Cholecalciferol (FISH OIL + D3 PO) Take by mouth 2 (two) times daily.    . furosemide (LASIX) 40 MG tablet Take 1 tablet (40 mg total) by mouth 2 (two) times daily. 180 tablet 1  . HYDROcodone-acetaminophen (NORCO) 10-325 MG tablet Take 1 tablet by mouth every 4 (four) hours as needed for severe pain. 150 tablet 0  . Ibuprofen (ADVIL) 200 MG CAPS Take 1 capsule by mouth as needed.    . linaclotide (LINZESS) 72 MCG capsule Take 1 capsule (72 mcg total) by mouth daily before breakfast. 30 capsule 3  . magnesium hydroxide (MILK OF MAGNESIA) 400 MG/5ML suspension Take 30 mLs by mouth.    . Misc. Devices (NASAL SPRAY BOTTLE) MISC by Does not apply route as needed.    . NON FORMULARY AREDS 2 Take 2 PO QD    . OVER THE COUNTER MEDICATION Mucus relief dextromethorphan Hbr 20 mg, guaifenisin 400 mg    . pantoprazole (PROTONIX) 40 MG tablet Take 1 tablet (40 mg total) by mouth daily. 90 tablet 3  . potassium chloride (KLOR-CON 10) 10 MEQ tablet Take 2 tablets by mouth daily 180 tablet 3  . simvastatin (ZOCOR) 20 MG tablet Take 1 tablet (  20 mg total) by mouth at bedtime. 90 tablet 3  . timolol (TIMOPTIC-XR) 0.5 % ophthalmic gel-forming Place 1 drop into both eyes daily.   3  . TRAVATAN Z 0.004 % SOLN ophthalmic solution Place 1 drop into both eyes at bedtime.   3  . vitamin B-12 (CYANOCOBALAMIN) 1000 MCG tablet Take 1,000 mcg by mouth daily.     Current Facility-Administered Medications  Medication Dose Route Frequency Provider Last Rate Last Dose  . 0.9 %  sodium chloride infusion  500 mL Intravenous Continuous Mauri Pole, MD        Physical Findings:  height is '5\' 4"'$  (1.626 m) and weight is 194 lb 3.2 oz (88.1 kg). Her oral temperature is 98.9 F (37.2 C). Her blood pressure is 121/83 and her pulse is 98.  In general this is a well appearing caucasian female in no acute  distress. She's alert and oriented x4 and appropriate throughout the examination. Cardiopulmonary assessment is negative for acute distress and she exhibits normal effort. The right breast is intact without significant hyperpigmentation. No desquamation is noted. There are several raised hyperkaratotic appearing skin lesions seen on the anterior chest. No ulceration is noted.  Lab Findings: Lab Results  Component Value Date   WBC 15.0 (H) 12/10/2015   HGB 13.8 12/10/2015   HCT 40.8 12/10/2015   MCV 101.0 12/10/2015   PLT 210 12/10/2015     Radiographic Findings: No results found.  Impression/Plan: 1. ER/PR negative DCIS of the right breast. The patient is doing well since recovering from the effects of radiotherapy. She is going to continue to follow up with Dr. Lindi Adie and survivorship moving forward.  2. Survivorship.The patient has an upcoming appointment with survivorship on 07/23/16, the importance of this appointment was discussed with the patient. 3. Dermatologic issues. Given her history of basal cell skin cancer, and new lesions shes's detected outside of the radiation field, I would recommend she meet with a dermatologist who specializes in skin cancer surgery. We will refer her to Dr. Link Snuffer.      Carola Rhine, PAC

## 2016-04-13 NOTE — Addendum Note (Signed)
Encounter addended by: Benn Moulder, RN on: 04/13/2016  5:42 PM<BR>    Actions taken: Charge Capture section accepted

## 2016-04-21 ENCOUNTER — Encounter: Payer: Self-pay | Admitting: *Deleted

## 2016-04-21 DIAGNOSIS — M1611 Unilateral primary osteoarthritis, right hip: Secondary | ICD-10-CM | POA: Diagnosis not present

## 2016-04-22 ENCOUNTER — Telehealth: Payer: Self-pay | Admitting: *Deleted

## 2016-04-22 NOTE — Telephone Encounter (Signed)
Linzess approved for 1 year with prior authorization  Pt informed by insurance company

## 2016-04-29 ENCOUNTER — Ambulatory Visit: Payer: Self-pay | Admitting: Orthopedic Surgery

## 2016-05-05 ENCOUNTER — Telehealth: Payer: Self-pay | Admitting: Family Medicine

## 2016-05-05 DIAGNOSIS — H401122 Primary open-angle glaucoma, left eye, moderate stage: Secondary | ICD-10-CM | POA: Diagnosis not present

## 2016-05-05 DIAGNOSIS — H353133 Nonexudative age-related macular degeneration, bilateral, advanced atrophic without subfoveal involvement: Secondary | ICD-10-CM | POA: Diagnosis not present

## 2016-05-05 DIAGNOSIS — H401111 Primary open-angle glaucoma, right eye, mild stage: Secondary | ICD-10-CM | POA: Diagnosis not present

## 2016-05-05 DIAGNOSIS — H04123 Dry eye syndrome of bilateral lacrimal glands: Secondary | ICD-10-CM | POA: Diagnosis not present

## 2016-05-05 MED ORDER — HYDROCODONE-ACETAMINOPHEN 10-325 MG PO TABS: 1.0000 | ORAL_TABLET | ORAL | 0 refills | Status: DC | PRN

## 2016-05-05 NOTE — Telephone Encounter (Signed)
Pt request refill  °HYDROcodone-acetaminophen (NORCO) 10-325 MG tablet °

## 2016-05-05 NOTE — Telephone Encounter (Signed)
done

## 2016-05-06 ENCOUNTER — Ambulatory Visit: Payer: Self-pay | Admitting: Orthopedic Surgery

## 2016-05-06 NOTE — Telephone Encounter (Signed)
Script is ready for pick up here at front office and I left a message.  

## 2016-05-06 NOTE — H&P (Signed)
TOTAL HIP ADMISSION H&P  Patient is admitted for right total hip arthroplasty.  Subjective:  Chief Complaint: right hip pain  HPI: Connie Lang, 75 y.o. female, has a history of pain and functional disability in the right hip(s) due to arthritis and patient has failed non-surgical conservative treatments for greater than 12 weeks to include NSAID's and/or analgesics, flexibility and strengthening excercises, use of assistive devices, weight reduction as appropriate and activity modification.  Onset of symptoms was gradual starting 2 years ago with gradually worsening course since that time.The patient noted no past surgery on the right hip(s).  Patient currently rates pain in the right hip at 10 out of 10 with activity. Patient has night pain, worsening of pain with activity and weight bearing, pain that interfers with activities of daily living and crepitus. Patient has evidence of subchondral cysts, subchondral sclerosis, periarticular osteophytes and joint space narrowing by imaging studies. This condition presents safety issues increasing the risk of falls. There is no current active infection.  Patient Active Problem List   Diagnosis Date Noted  . Family history of ovarian cancer 01/27/2016  . Family history of colon cancer 01/27/2016  . Breast cancer of upper-outer quadrant of right female breast ([place]) 12/03/2015  . Generalized edema 11/21/2015  . PMR (polymyalgia rheumatica) (HCC) 07/23/2015  . RBBB (right bundle branch block) 11/01/2013  . HTN (hypertension) 11/01/2013  . Obesity, unspecified 11/01/2013  . Pure hypercholesterolemia 11/01/2013  . CARCINOMA, BASAL CELL 07/29/2009  . HYPERLIPIDEMIA 07/29/2009  . GLAUCOMA 07/29/2009  . HYPERTENSION 07/29/2009  . Osteoarthritis 07/29/2009  . COLONIC POLYPS, HX OF 07/29/2009   Past Medical History:  Diagnosis Date  . Allergy    occasionally has hayfever  . Anxiety   . Breast cancer ([place]) 2017  . Cancer ([place]) 1987    cancerous cells in vaginal wall seemed to be metastatic from the gut but no primary was ever found   . Chicken pox   . Constipation   . Depression   . DJD (degenerative joint disease)   . GERD (gastroesophageal reflux disease)   . Glaucoma    glaucoma and cataracts sees Dr Satira Sark   . Hyperlipemia   . Hypertension   . Lumbar disc disease    sees Dr. Sherwood Gambler   . Osteoarthritis   . Osteopenia   . PMR (polymyalgia rheumatica) (HCC)    sees Dr. Leigh [place]   . Radiation    02-25-16 last radiation for breast cancer  . Transfusion history    blood     Past Surgical History:  Procedure Laterality Date  . ABDOMINAL HYSTERECTOMY     TAH BSO 1985  . APPENDECTOMY    . BASAL CELL CARCINOMA EXCISION     nose and rt thigh  . BREAST BIOPSY      x 4  . BREAST LUMPECTOMY WITH RADIOACTIVE SEED LOCALIZATION Right 12/25/2015   Procedure: RIGHT BREAST LUMPECTOMY WITH RADIOACTIVE SEED LOCALIZATION;  Surgeon: Rolm Bookbinder, MD;  Location: [facility];  Service: General;  Laterality: Right;  . CATARACT EXTRACTION     01-27-2011 left, 02-17-2011 right   . CERVICAL LAMINECTOMY     1983  . cervical microdiskectomy     1990 Dr Jovita Gamma  . CESAREAN SECTION    . COLONOSCOPY  02/14/2006   in [place], clear, repeat in 10 yrs - 3 colons in the past 1 with polyps that were benign- last colon no polyps   . FOOT ARTHRODESIS, MODIFIED [facility]BR[place]    .  LUMBAR LAMINECTOMY     1990 Dr Jovita Gamma  . MYELOGRAM    . POLYPECTOMY    . REFRACTIVE SURGERY     01-29-2009 04-07-2009 Dr Foye Clock in [place]  . spinal injection     07-30-2012, 09-24-2012  . TENDON REPAIR     rt wrist  . VAGINAL BIRTH AFTER CESAREAN SECTION     x3     (Not in a hospital admission) No Known Allergies  Social History  Substance Use Topics  . Smoking status: Former Smoker    Packs/day: 0.50    Years: 36.00    Types: Cigarettes    Quit date: 07/06/1995  . Smokeless tobacco: Never Used  . Alcohol  use No     Comment: no wine at present     Family History  Problem Relation Age of Onset  . Alzheimer's disease Father   . Heart failure Father     d. 104  . Stroke Mother 98  . Cancer Sister     ovarian  . Alzheimer's disease Paternal Aunt   . Breast cancer Paternal Aunt   . Colon cancer Brother     dx. 36s  . Stroke Maternal Aunt     d. 63s  . Heart disease Maternal Grandfather   . Diabetes Maternal Grandfather   . Stroke Paternal Grandmother 73  . Heart attack Paternal Grandfather     d. 105s  . Ovarian cancer Sister     dx. 70s; no genetic testing  . Leukemia Grandchild 21  . Skin cancer Grandchild   . Cervical cancer Cousin 58    maternal 1st cousin  . Alzheimer's disease Paternal Aunt     (x4) paternal aunts  . Heart attack Paternal Uncle 83  . Colon polyps Neg Hx   . Rectal cancer Neg Hx   . Stomach cancer Neg Hx      Review of Systems  Constitutional: Negative.   HENT: Negative.   Eyes: Positive for blurred vision.  Respiratory: Negative.   Cardiovascular: Negative.   Gastrointestinal: Positive for heartburn.  Genitourinary: Negative.   Musculoskeletal: Positive for joint pain.  Skin: Negative.   Neurological: Negative.   Endo/Heme/Allergies: Negative.   Psychiatric/Behavioral: Negative.     Objective:  Physical Exam  Vitals reviewed. Constitutional: She is oriented to person, place, and time. She appears well-developed and well-nourished.  HENT:  Head: Normocephalic and atraumatic.  Eyes: Conjunctivae and EOM are normal. Pupils are equal, round, and reactive to light.  Neck: Normal range of motion. Neck supple.  Cardiovascular: Normal rate, regular rhythm and intact distal pulses.   Respiratory: Effort normal. No respiratory distress.  GI: Soft. She exhibits no distension.  Genitourinary:  Genitourinary Comments: deferred  Neurological: She is alert and oriented to person, place, and time. She has normal reflexes.  Skin: Skin is warm and dry.   Psychiatric: She has a normal mood and affect. Her behavior is normal. Judgment and thought content normal.    Vital signs in last 24 hours: '@VSRANGES'$ @  Labs:   Estimated body mass index is 33.33 kg/m as calculated from the following:   Height as of 04/13/16: '5\' 4"'$  (1.626 m).   Weight as of 04/13/16: 88.1 kg (194 lb 3.2 oz).   Imaging Review Plain radiographs demonstrate severe degenerative joint disease of the right hip(s). The bone quality appears to be adequate for age and reported activity level.  Assessment/Plan:  End stage arthritis, right hip(s)  The patient history, physical examination,  clinical judgement of the provider and imaging studies are consistent with end stage degenerative joint disease of the right hip(s) and total hip arthroplasty is deemed medically necessary. The treatment options including medical management, injection therapy, arthroscopy and arthroplasty were discussed at length. The risks and benefits of total hip arthroplasty were presented and reviewed. The risks due to aseptic loosening, infection, stiffness, dislocation/subluxation,  thromboembolic complications and other imponderables were discussed.  The patient acknowledged the explanation, agreed to proceed with the plan and consent was signed. Patient is being admitted for inpatient treatment for surgery, pain control, PT, OT, prophylactic antibiotics, VTE prophylaxis, progressive ambulation and ADL's and discharge planning.The patient is planning to be discharged home with home health services.

## 2016-05-07 ENCOUNTER — Encounter (HOSPITAL_COMMUNITY): Payer: Self-pay

## 2016-05-07 ENCOUNTER — Encounter (HOSPITAL_COMMUNITY)
Admission: RE | Admit: 2016-05-07 | Discharge: 2016-05-07 | Disposition: A | Discharge status: Home / Self Care | Payer: Medicare Other | Source: Ambulatory Visit | Attending: Orthopedic Surgery | Admitting: Orthopedic Surgery

## 2016-05-07 DIAGNOSIS — Z01818 Encounter for other preprocedural examination: Secondary | ICD-10-CM | POA: Insufficient documentation

## 2016-05-07 DIAGNOSIS — Z01812 Encounter for preprocedural laboratory examination: Secondary | ICD-10-CM | POA: Insufficient documentation

## 2016-05-07 LAB — BASIC METABOLIC PANEL
ANION GAP: 9 (ref 5–15)
BUN: 13 mg/dL (ref 6–20)
CALCIUM: 10.4 mg/dL — AB (ref 8.9–10.3)
CO2: 30 mmol/L (ref 22–32)
Chloride: 102 mmol/L (ref 101–111)
Creatinine, Ser: 0.79 mg/dL (ref 0.44–1.00)
GLUCOSE: 102 mg/dL — AB (ref 65–99)
Potassium: 4.2 mmol/L (ref 3.5–5.1)
SODIUM: 141 mmol/L (ref 135–145)

## 2016-05-07 LAB — ABO/RH: ABO/RH(D): O NEG

## 2016-05-07 LAB — CBC
HCT: 41.7 % (ref 36.0–46.0)
Hemoglobin: 14 g/dL (ref 12.0–15.0)
MCH: 31.6 pg (ref 26.0–34.0)
MCHC: 33.6 g/dL (ref 30.0–36.0)
MCV: 94.1 fL (ref 78.0–100.0)
PLATELETS: 231 10*3/uL (ref 150–400)
RBC: 4.43 MIL/uL (ref 3.87–5.11)
RDW: 12.5 % (ref 11.5–15.5)
WBC: 9.3 10*3/uL (ref 4.0–10.5)

## 2016-05-07 LAB — SURGICAL PCR SCREEN
MRSA, PCR: NEGATIVE
Staphylococcus aureus: NEGATIVE

## 2016-05-07 NOTE — Patient Instructions (Addendum)
NABEEHA BADERTSCHER  05/07/2016   Your procedure is scheduled on: 05/13/16  Report to [facility] Main  Entrance take [facility]  elevators to 3rd floor to  [place] at 10:45 AM.  Call this number if you have problems the morning of surgery [phone]   Remember: ONLY 1 PERSON MAY GO WITH YOU TO SHORT STAY TO GET  READY MORNING OF [place].  Do not eat food or drink liquids :After Midnight.     Take these medicines the morning of surgery with A SIP OF WATER: Pantoprazole (Protonix), Timolol eye drops, Hydrocodone-Acetaminophen if needed, Oxymetazoline (Afrin) nasal spray if needed, Propyline glycol eye drops if needed.   Bring eye drops and nasal sprays with you day of surgery.                               You may not have any metal on your body including hair pins and              piercings  Do not wear jewelry, make-up, lotions, powders or perfumes, deodorant             Do not wear nail polish.  Do not shave  48 hours prior to surgery.              Men may shave face and neck.   Do not bring valuables to the hospital. [place].  Contacts, dentures or bridgework may not be worn into surgery.  Leave suitcase in the car. After surgery it may be brought to your room.               Please read over the following fact sheets you were given: _____________________________________________________________________             [facility] - Preparing for Surgery Before surgery, you can play an important role.  Because skin is not sterile, your skin needs to be as free of germs as possible.  You can reduce the number of germs on your skin by washing with CHG (chlorahexidine gluconate) soap before surgery.  CHG is an antiseptic cleaner which kills germs and bonds with the skin to continue killing germs even after washing. Please DO NOT use if you have an allergy to CHG or antibacterial soaps.  If your skin  becomes reddened/irritated stop using the CHG and inform your nurse when you arrive at Short Stay. Do not shave (including legs and underarms) for at least 48 hours prior to the first CHG shower.  You may shave your face/neck. Please follow these instructions carefully:  1.  Shower with CHG Soap the night before surgery and the  morning of Surgery.  2.  If you choose to wash your hair, wash your hair first as usual with your  normal  shampoo.  3.  After you shampoo, rinse your hair and body thoroughly to remove the  shampoo.                           4.  Use CHG as you would any other liquid soap.  You can apply chg directly  to the skin and wash  Gently with a scrungie or clean washcloth.  5.  Apply the CHG Soap to your body ONLY FROM THE NECK DOWN.   Do not use on face/ open                           Wound or open sores. Avoid contact with eyes, ears mouth and genitals (private parts).                       Wash face,  Genitals (private parts) with your normal soap.             6.  Wash thoroughly, paying special attention to the area where your surgery  will be performed.  7.  Thoroughly rinse your body with warm water from the neck down.  8.  DO NOT shower/wash with your normal soap after using and rinsing off  the CHG Soap.                9.  Pat yourself dry with a clean towel.            10.  Wear clean pajamas.            11.  Place clean sheets on your bed the night of your first shower and do not  sleep with pets. Day of Surgery : Do not apply any lotions/deodorants the morning of surgery.  Please wear clean clothes to the hospital/surgery center.  FAILURE TO FOLLOW THESE INSTRUCTIONS MAY RESULT IN THE CANCELLATION OF YOUR SURGERY PATIENT SIGNATURE_________________________________  NURSE SIGNATURE__________________________________  ________________________________________________________________________   Adam Phenix  An incentive spirometer is a  tool that can help keep your lungs clear and active. This tool measures how well you are filling your lungs with each breath. Taking long deep breaths may help reverse or decrease the chance of developing breathing (pulmonary) problems (especially infection) following:  A long period of time when you are unable to move or be active. BEFORE THE PROCEDURE   If the spirometer includes an indicator to show your best effort, your nurse or respiratory therapist will set it to a desired goal.  If possible, sit up straight or lean slightly forward. Try not to slouch.  Hold the incentive spirometer in an upright position. INSTRUCTIONS FOR USE  1. Sit on the edge of your bed if possible, or sit up as far as you can in bed or on a chair. 2. Hold the incentive spirometer in an upright position. 3. Breathe out normally. 4. Place the mouthpiece in your mouth and seal your lips tightly around it. 5. Breathe in slowly and as deeply as possible, raising the piston or the ball toward the top of the column. 6. Hold your breath for 3-5 seconds or for as long as possible. Allow the piston or ball to fall to the bottom of the column. 7. Remove the mouthpiece from your mouth and breathe out normally. 8. Rest for a few seconds and repeat Steps 1 through 7 at least 10 times every 1-2 hours when you are awake. Take your time and take a few normal breaths between deep breaths. 9. The spirometer may include an indicator to show your best effort. Use the indicator as a goal to work toward during each repetition. 10. After each set of 10 deep breaths, practice coughing to be sure your lungs are clear. If you have an incision (the cut made at the time of surgery),  support your incision when coughing by placing a pillow or rolled up towels firmly against it. Once you are able to get out of bed, walk around indoors and cough well. You may stop using the incentive spirometer when instructed by your caregiver.  RISKS AND  COMPLICATIONS  Take your time so you do not get dizzy or light-headed.  If you are in pain, you may need to take or ask for pain medication before doing incentive spirometry. It is harder to take a deep breath if you are having pain. AFTER USE  Rest and breathe slowly and easily.  It can be helpful to keep track of a log of your progress. Your caregiver can provide you with a simple table to help with this. If you are using the spirometer at home, follow these instructions: [place] IF:   You are having difficultly using the spirometer.  You have trouble using the spirometer as often as instructed.  Your pain medication is not giving enough relief while using the spirometer.  You develop fever of 100.5 F (38.1 C) or higher. SEEK IMMEDIATE MEDICAL CARE IF:   You cough up bloody sputum that had not been present before.  You develop fever of 102 F (38.9 C) or greater.  You develop worsening pain at or near the incision site. MAKE SURE YOU:   Understand these instructions.  Will watch your condition.  Will get help right away if you are not doing well or get worse. Document Released: 11/01/2006 Document Revised: 09/13/2011 Document Reviewed: 01/02/2007 ExitCare Patient Information 2014 ExitCare, [place].   ________________________________________________________________________  WHAT IS A BLOOD TRANSFUSION? Blood Transfusion Information  A transfusion is the replacement of blood or some of its parts. Blood is made up of multiple cells which provide different functions.  Red blood cells carry oxygen and are used for blood loss replacement.  White blood cells fight against infection.  Platelets control bleeding.  Plasma helps clot blood.  Other blood products are available for specialized needs, such as hemophilia or other clotting disorders. BEFORE THE TRANSFUSION  Who gives blood for transfusions?   Healthy volunteers who are fully evaluated to make sure  their blood is safe. This is blood bank blood. Transfusion therapy is the safest it has ever been in the practice of medicine. Before blood is taken from a donor, a complete history is taken to make sure that person has no history of diseases nor engages in risky social behavior (examples are intravenous drug use or sexual activity with multiple partners). The donor's travel history is screened to minimize risk of transmitting infections, such as malaria. The donated blood is tested for signs of infectious diseases, such as HIV and hepatitis. The blood is then tested to be sure it is compatible with you in order to minimize the chance of a transfusion reaction. If you or a relative donates blood, this is often done in anticipation of surgery and is not appropriate for emergency situations. It takes many days to process the donated blood. RISKS AND COMPLICATIONS Although transfusion therapy is very safe and saves many lives, the main dangers of transfusion include:   Getting an infectious disease.  Developing a transfusion reaction. This is an allergic reaction to something in the blood you were given. Every precaution is taken to prevent this. The decision to have a blood transfusion has been considered carefully by your caregiver before blood is given. Blood is not given unless the benefits outweigh the risks. AFTER THE TRANSFUSION  Right after receiving a blood transfusion, you will usually feel much better and more energetic. This is especially true if your red blood cells have gotten low (anemic). The transfusion raises the level of the red blood cells which carry oxygen, and this usually causes an energy increase.  The nurse administering the transfusion will monitor you carefully for complications. HOME CARE INSTRUCTIONS  No special instructions are needed after a transfusion. You may find your energy is better. Speak with your caregiver about any limitations on activity for underlying diseases  you may have. SEEK MEDICAL CARE IF:   Your condition is not improving after your transfusion.  You develop redness or irritation at the intravenous (IV) site. SEEK IMMEDIATE MEDICAL CARE IF:  Any of the following symptoms occur over the next 12 hours:  Shaking chills.  You have a temperature by mouth above 102 F (38.9 C), not controlled by medicine.  Chest, back, or muscle pain.  People around you feel you are not acting correctly or are confused.  Shortness of breath or difficulty breathing.  Dizziness and fainting.  You get a rash or develop hives.  You have a decrease in urine output.  Your urine turns a dark color or changes to pink, red, or brown. Any of the following symptoms occur over the next 10 days:  You have a temperature by mouth above 102 F (38.9 C), not controlled by medicine.  Shortness of breath.  Weakness after normal activity.  The white part of the eye turns yellow (jaundice).  You have a decrease in the amount of urine or are urinating less often.  Your urine turns a dark color or changes to pink, red, or brown. Document Released: 06/18/2000 Document Revised: 09/13/2011 Document Reviewed: 02/05/2008 [facility] Patient Information 2014 [place], [place].  _______________________________________________________________________

## 2016-05-10 ENCOUNTER — Encounter: Payer: Self-pay | Admitting: Gastroenterology

## 2016-05-10 NOTE — Progress Notes (Addendum)
pts husband Emily Massar called  in regards to adding Lisinopril to pts medication list; instructed pt will not take this am of surgery.

## 2016-05-11 ENCOUNTER — Telehealth: Payer: Self-pay | Admitting: *Deleted

## 2016-05-11 NOTE — Telephone Encounter (Signed)
Called patient to inform of appt. with PA, Felix Ahmadi on 07-09-16 @ 2 pm , spoke with patient's husband- [name] and he is aware of this appt.

## 2016-05-13 ENCOUNTER — Inpatient Hospital Stay (HOSPITAL_COMMUNITY): Payer: Medicare Other

## 2016-05-13 ENCOUNTER — Encounter (HOSPITAL_COMMUNITY)
Admission: RE | Disposition: A | Discharge status: Home Health | Payer: Self-pay | Source: Ambulatory Visit | Attending: Orthopedic Surgery

## 2016-05-13 ENCOUNTER — Inpatient Hospital Stay (HOSPITAL_COMMUNITY): Payer: Medicare Other | Admitting: Registered Nurse

## 2016-05-13 ENCOUNTER — Encounter (HOSPITAL_COMMUNITY): Payer: Self-pay | Admitting: *Deleted

## 2016-05-13 ENCOUNTER — Inpatient Hospital Stay (HOSPITAL_COMMUNITY)
Admission: RE | Admit: 2016-05-13 | Discharge: 2016-05-14 | DRG: 470 | Disposition: A | Discharge status: Home Health | Payer: Medicare Other | Source: Ambulatory Visit | Attending: Orthopedic Surgery | Admitting: Orthopedic Surgery

## 2016-05-13 DIAGNOSIS — Z8041 Family history of malignant neoplasm of ovary: Secondary | ICD-10-CM | POA: Diagnosis not present

## 2016-05-13 DIAGNOSIS — M1611 Unilateral primary osteoarthritis, right hip: Principal | ICD-10-CM | POA: Diagnosis present

## 2016-05-13 DIAGNOSIS — Z87891 Personal history of nicotine dependence: Secondary | ICD-10-CM

## 2016-05-13 DIAGNOSIS — F329 Major depressive disorder, single episode, unspecified: Secondary | ICD-10-CM | POA: Diagnosis present

## 2016-05-13 DIAGNOSIS — Z823 Family history of stroke: Secondary | ICD-10-CM

## 2016-05-13 DIAGNOSIS — Z833 Family history of diabetes mellitus: Secondary | ICD-10-CM | POA: Diagnosis not present

## 2016-05-13 DIAGNOSIS — C50411 Malignant neoplasm of upper-outer quadrant of right female breast: Secondary | ICD-10-CM | POA: Diagnosis not present

## 2016-05-13 DIAGNOSIS — K219 Gastro-esophageal reflux disease without esophagitis: Secondary | ICD-10-CM | POA: Diagnosis present

## 2016-05-13 DIAGNOSIS — Z6834 Body mass index (BMI) 34.0-34.9, adult: Secondary | ICD-10-CM

## 2016-05-13 DIAGNOSIS — Z800 Family history of malignant neoplasm of digestive organs: Secondary | ICD-10-CM | POA: Diagnosis not present

## 2016-05-13 DIAGNOSIS — M25551 Pain in right hip: Secondary | ICD-10-CM | POA: Diagnosis present

## 2016-05-13 DIAGNOSIS — E785 Hyperlipidemia, unspecified: Secondary | ICD-10-CM | POA: Diagnosis present

## 2016-05-13 DIAGNOSIS — I10 Essential (primary) hypertension: Secondary | ICD-10-CM | POA: Diagnosis not present

## 2016-05-13 DIAGNOSIS — M353 Polymyalgia rheumatica: Secondary | ICD-10-CM | POA: Diagnosis not present

## 2016-05-13 DIAGNOSIS — E7800 Pure hypercholesterolemia, unspecified: Secondary | ICD-10-CM | POA: Diagnosis present

## 2016-05-13 DIAGNOSIS — Z8249 Family history of ischemic heart disease and other diseases of the circulatory system: Secondary | ICD-10-CM

## 2016-05-13 DIAGNOSIS — Z923 Personal history of irradiation: Secondary | ICD-10-CM | POA: Diagnosis not present

## 2016-05-13 DIAGNOSIS — Z471 Aftercare following joint replacement surgery: Secondary | ICD-10-CM | POA: Diagnosis not present

## 2016-05-13 DIAGNOSIS — I4510 Unspecified right bundle-branch block: Secondary | ICD-10-CM | POA: Diagnosis not present

## 2016-05-13 DIAGNOSIS — Z09 Encounter for follow-up examination after completed treatment for conditions other than malignant neoplasm: Secondary | ICD-10-CM

## 2016-05-13 DIAGNOSIS — R269 Unspecified abnormalities of gait and mobility: Secondary | ICD-10-CM | POA: Diagnosis not present

## 2016-05-13 DIAGNOSIS — Z96641 Presence of right artificial hip joint: Secondary | ICD-10-CM | POA: Diagnosis not present

## 2016-05-13 HISTORY — PX: TOTAL HIP ARTHROPLASTY: SHX124

## 2016-05-13 LAB — TYPE AND SCREEN
ABO/RH(D): O NEG
ANTIBODY SCREEN: NEGATIVE

## 2016-05-13 SURGERY — ARTHROPLASTY, HIP, TOTAL, ANTERIOR APPROACH
Anesthesia: Spinal | Site: Hip | Laterality: Right

## 2016-05-13 MED ORDER — MIDAZOLAM HCL 2 MG/2ML IJ SOLN
INTRAMUSCULAR | Status: AC
  Filled 2016-05-13: qty 2

## 2016-05-13 MED ORDER — DEXAMETHASONE SODIUM PHOSPHATE 10 MG/ML IJ SOLN
10.0000 mg | Freq: Once | INTRAMUSCULAR | Status: AC
Start: 2016-05-14 — End: 2016-05-14
  Administered 2016-05-14: 10 mg via INTRAVENOUS
  Filled 2016-05-13: qty 1

## 2016-05-13 MED ORDER — POVIDONE-IODINE 10 % EX SWAB: 2.0000 "application " | Freq: Once | CUTANEOUS | Status: DC

## 2016-05-13 MED ORDER — KETOROLAC TROMETHAMINE 30 MG/ML IJ SOLN
INTRAMUSCULAR | Status: DC | PRN
  Administered 2016-05-13: 30 mg via INTRAVENOUS

## 2016-05-13 MED ORDER — ACETAMINOPHEN 325 MG PO TABS: 650.0000 mg | ORAL_TABLET | Freq: Four times a day (QID) | ORAL | Status: DC | PRN

## 2016-05-13 MED ORDER — PANTOPRAZOLE SODIUM 40 MG PO TBEC
40.0000 mg | DELAYED_RELEASE_TABLET | Freq: Every day | ORAL | Status: DC
  Administered 2016-05-14: 40 mg via ORAL
  Filled 2016-05-13: qty 1

## 2016-05-13 MED ORDER — SENNA 8.6 MG PO TABS
2.0000 | ORAL_TABLET | Freq: Every day | ORAL | Status: DC
  Administered 2016-05-13: 17.2 mg via ORAL
  Filled 2016-05-13: qty 2

## 2016-05-13 MED ORDER — SODIUM CHLORIDE 0.9 % IJ SOLN
INTRAMUSCULAR | Status: AC
  Filled 2016-05-13: qty 50

## 2016-05-13 MED ORDER — VITAMIN B-12 1000 MCG PO TABS
1000.0000 ug | ORAL_TABLET | Freq: Every day | ORAL | Status: DC
  Administered 2016-05-13 – 2016-05-14 (×2): 1000 ug via ORAL
  Filled 2016-05-13 (×2): qty 1

## 2016-05-13 MED ORDER — ACETAMINOPHEN 10 MG/ML IV SOLN
INTRAVENOUS | Status: AC
  Filled 2016-05-13: qty 100

## 2016-05-13 MED ORDER — ONDANSETRON HCL 4 MG/2ML IJ SOLN: 4.0000 mg | Freq: Four times a day (QID) | INTRAMUSCULAR | Status: DC | PRN

## 2016-05-13 MED ORDER — SODIUM CHLORIDE 0.9% FLUSH: 10.0000 mL | INTRAVENOUS | Status: DC | PRN

## 2016-05-13 MED ORDER — ONDANSETRON HCL 4 MG/2ML IJ SOLN
INTRAMUSCULAR | Status: AC
  Filled 2016-05-13: qty 2

## 2016-05-13 MED ORDER — METOCLOPRAMIDE HCL 5 MG PO TABS: 5.0000 mg | ORAL_TABLET | Freq: Three times a day (TID) | ORAL | Status: DC | PRN

## 2016-05-13 MED ORDER — FUROSEMIDE 40 MG PO TABS
40.0000 mg | ORAL_TABLET | Freq: Two times a day (BID) | ORAL | Status: DC
  Administered 2016-05-14: 40 mg via ORAL
  Filled 2016-05-13: qty 1

## 2016-05-13 MED ORDER — LACTATED RINGERS IV SOLN
INTRAVENOUS | Status: DC
  Administered 2016-05-13 (×4): via INTRAVENOUS

## 2016-05-13 MED ORDER — DOCUSATE SODIUM 100 MG PO CAPS
100.0000 mg | ORAL_CAPSULE | Freq: Two times a day (BID) | ORAL | Status: DC
  Administered 2016-05-13 – 2016-05-14 (×2): 100 mg via ORAL
  Filled 2016-05-13 (×2): qty 1

## 2016-05-13 MED ORDER — KETOROLAC TROMETHAMINE 30 MG/ML IJ SOLN
INTRAMUSCULAR | Status: AC
  Filled 2016-05-13: qty 1

## 2016-05-13 MED ORDER — LISINOPRIL 10 MG PO TABS
10.0000 mg | ORAL_TABLET | Freq: Every day | ORAL | Status: DC
  Administered 2016-05-13: 10 mg via ORAL
  Filled 2016-05-13 (×2): qty 1

## 2016-05-13 MED ORDER — BUPIVACAINE-EPINEPHRINE 0.25% -1:200000 IJ SOLN
INTRAMUSCULAR | Status: DC | PRN
  Administered 2016-05-13: 30 mL

## 2016-05-13 MED ORDER — HYDROCODONE-ACETAMINOPHEN 10-325 MG PO TABS
1.0000 | ORAL_TABLET | ORAL | Status: DC | PRN
  Administered 2016-05-13 – 2016-05-14 (×6): 2 via ORAL
  Filled 2016-05-13 (×6): qty 2

## 2016-05-13 MED ORDER — TRANEXAMIC ACID 1000 MG/10ML IV SOLN
1000.0000 mg | INTRAVENOUS | Status: AC
  Administered 2016-05-13: 1000 mg via INTRAVENOUS
  Filled 2016-05-13: qty 1100

## 2016-05-13 MED ORDER — HYDROMORPHONE HCL 1 MG/ML IJ SOLN
0.5000 mg | INTRAMUSCULAR | Status: DC | PRN
  Administered 2016-05-13: 0.5 mg via INTRAVENOUS
  Filled 2016-05-13: qty 1

## 2016-05-13 MED ORDER — WATER FOR IRRIGATION, STERILE IR SOLN
Status: DC | PRN
  Administered 2016-05-13: 3000 mL

## 2016-05-13 MED ORDER — ONDANSETRON HCL 4 MG/2ML IJ SOLN
INTRAMUSCULAR | Status: DC | PRN
  Administered 2016-05-13: 4 mg via INTRAVENOUS

## 2016-05-13 MED ORDER — HYDROMORPHONE HCL 1 MG/ML IJ SOLN
0.2500 mg | INTRAMUSCULAR | Status: DC | PRN
  Administered 2016-05-13 (×4): 0.5 mg via INTRAVENOUS

## 2016-05-13 MED ORDER — HYDROMORPHONE HCL 1 MG/ML IJ SOLN
INTRAMUSCULAR | Status: AC
  Filled 2016-05-13: qty 1

## 2016-05-13 MED ORDER — ACETAMINOPHEN 650 MG RE SUPP: 650.0000 mg | Freq: Four times a day (QID) | RECTAL | Status: DC | PRN

## 2016-05-13 MED ORDER — CEFAZOLIN SODIUM-DEXTROSE 2-4 GM/100ML-% IV SOLN
2.0000 g | Freq: Four times a day (QID) | INTRAVENOUS | Status: AC
  Administered 2016-05-13 – 2016-05-14 (×2): 2 g via INTRAVENOUS
  Filled 2016-05-13 (×2): qty 100

## 2016-05-13 MED ORDER — SODIUM CHLORIDE 0.9 % IJ SOLN
INTRAMUSCULAR | Status: DC | PRN
  Administered 2016-05-13: 30 mL via INTRAVENOUS

## 2016-05-13 MED ORDER — FENTANYL CITRATE (PF) 100 MCG/2ML IJ SOLN
INTRAMUSCULAR | Status: AC
  Filled 2016-05-13: qty 2

## 2016-05-13 MED ORDER — POTASSIUM CHLORIDE ER 10 MEQ PO TBCR
20.0000 meq | EXTENDED_RELEASE_TABLET | Freq: Every day | ORAL | Status: DC
Start: 2016-05-13 — End: 2016-05-14
  Administered 2016-05-13 – 2016-05-14 (×2): 20 meq via ORAL
  Filled 2016-05-13 (×3): qty 2

## 2016-05-13 MED ORDER — CALCIUM CARBONATE-VITAMIN D 500-200 MG-UNIT PO TABS
1.0000 | ORAL_TABLET | Freq: Two times a day (BID) | ORAL | Status: DC
  Administered 2016-05-14: 1 via ORAL
  Filled 2016-05-13: qty 1

## 2016-05-13 MED ORDER — MENTHOL 3 MG MT LOZG: 1.0000 | LOZENGE | OROMUCOSAL | Status: DC | PRN

## 2016-05-13 MED ORDER — CHLORHEXIDINE GLUCONATE 4 % EX LIQD: 60.0000 mL | Freq: Once | CUTANEOUS | Status: DC

## 2016-05-13 MED ORDER — ASPIRIN 81 MG PO CHEW
81.0000 mg | CHEWABLE_TABLET | Freq: Two times a day (BID) | ORAL | Status: DC
  Administered 2016-05-13 – 2016-05-14 (×2): 81 mg via ORAL
  Filled 2016-05-13 (×2): qty 1

## 2016-05-13 MED ORDER — ONDANSETRON HCL 4 MG PO TABS: 4.0000 mg | ORAL_TABLET | Freq: Four times a day (QID) | ORAL | Status: DC | PRN

## 2016-05-13 MED ORDER — CEFAZOLIN SODIUM-DEXTROSE 2-4 GM/100ML-% IV SOLN
2.0000 g | INTRAVENOUS | Status: AC
  Administered 2016-05-13: 2 g via INTRAVENOUS

## 2016-05-13 MED ORDER — CEFAZOLIN SODIUM-DEXTROSE 2-4 GM/100ML-% IV SOLN
INTRAVENOUS | Status: AC
  Filled 2016-05-13: qty 100

## 2016-05-13 MED ORDER — PROPOFOL 10 MG/ML IV BOLUS
INTRAVENOUS | Status: AC
  Filled 2016-05-13: qty 20

## 2016-05-13 MED ORDER — PHENOL 1.4 % MT LIQD: 1.0000 | OROMUCOSAL | Status: DC | PRN

## 2016-05-13 MED ORDER — ISOPROPYL ALCOHOL 70 % SOLN
Status: AC
  Filled 2016-05-13: qty 480

## 2016-05-13 MED ORDER — PHENYLEPHRINE HCL 10 MG/ML IJ SOLN
INTRAVENOUS | Status: DC | PRN
  Administered 2016-05-13: 50 ug/min via INTRAVENOUS

## 2016-05-13 MED ORDER — DIPHENHYDRAMINE HCL 12.5 MG/5ML PO ELIX: 12.5000 mg | ORAL_SOLUTION | ORAL | Status: DC | PRN

## 2016-05-13 MED ORDER — LATANOPROST 0.005 % OP SOLN
1.0000 [drp] | Freq: Every day | OPHTHALMIC | Status: DC
Start: 2016-05-13 — End: 2016-05-14
  Administered 2016-05-13: 1 [drp] via OPHTHALMIC
  Filled 2016-05-13: qty 2.5

## 2016-05-13 MED ORDER — DEXTROSE 5 % IV SOLN
500.0000 mg | Freq: Four times a day (QID) | INTRAVENOUS | Status: DC | PRN
  Administered 2016-05-13: 500 mg via INTRAVENOUS
  Filled 2016-05-13: qty 550
  Filled 2016-05-13: qty 5

## 2016-05-13 MED ORDER — SODIUM CHLORIDE 0.9 % IR SOLN
Status: DC | PRN
  Administered 2016-05-13: 3000 mL

## 2016-05-13 MED ORDER — DEXAMETHASONE SODIUM PHOSPHATE 10 MG/ML IJ SOLN
INTRAMUSCULAR | Status: AC
  Filled 2016-05-13: qty 1

## 2016-05-13 MED ORDER — AMLODIPINE BESYLATE 5 MG PO TABS: 5.0000 mg | ORAL_TABLET | Freq: Every evening | ORAL | Status: DC

## 2016-05-13 MED ORDER — SIMVASTATIN 20 MG PO TABS
20.0000 mg | ORAL_TABLET | Freq: Every day | ORAL | Status: DC
  Administered 2016-05-13: 20 mg via ORAL
  Filled 2016-05-13: qty 1

## 2016-05-13 MED ORDER — SODIUM CHLORIDE 0.9 % IV SOLN
INTRAVENOUS | Status: DC
  Administered 2016-05-13 – 2016-05-14 (×2): via INTRAVENOUS

## 2016-05-13 MED ORDER — PROPOFOL 500 MG/50ML IV EMUL
INTRAVENOUS | Status: DC | PRN
  Administered 2016-05-13: 50 ug/kg/min via INTRAVENOUS

## 2016-05-13 MED ORDER — PROPOFOL 10 MG/ML IV BOLUS
INTRAVENOUS | Status: DC | PRN
  Administered 2016-05-13: 20 mg via INTRAVENOUS

## 2016-05-13 MED ORDER — HYDROGEN PEROXIDE 3 % EX SOLN
CUTANEOUS | Status: AC
  Filled 2016-05-13: qty 473

## 2016-05-13 MED ORDER — PHENYLEPHRINE HCL 10 MG/ML IJ SOLN
INTRAMUSCULAR | Status: DC | PRN
  Administered 2016-05-13: 80 ug via INTRAVENOUS
  Administered 2016-05-13: 120 ug via INTRAVENOUS
  Administered 2016-05-13: 80 ug via INTRAVENOUS

## 2016-05-13 MED ORDER — DEXAMETHASONE SODIUM PHOSPHATE 10 MG/ML IJ SOLN
INTRAMUSCULAR | Status: DC | PRN
  Administered 2016-05-13: 10 mg via INTRAVENOUS

## 2016-05-13 MED ORDER — HYDROMORPHONE HCL 1 MG/ML IJ SOLN
0.2500 mg | INTRAMUSCULAR | Status: DC | PRN
  Administered 2016-05-13 (×2): 0.5 mg via INTRAVENOUS

## 2016-05-13 MED ORDER — PHENYLEPHRINE HCL 10 MG/ML IJ SOLN
INTRAMUSCULAR | Status: AC
  Filled 2016-05-13: qty 1

## 2016-05-13 MED ORDER — TIMOLOL MALEATE 0.5 % OP SOLG
1.0000 [drp] | Freq: Every day | OPHTHALMIC | Status: DC
  Administered 2016-05-14: 1 [drp] via OPHTHALMIC
  Filled 2016-05-13: qty 5

## 2016-05-13 MED ORDER — METOCLOPRAMIDE HCL 5 MG/ML IJ SOLN: 5.0000 mg | Freq: Three times a day (TID) | INTRAMUSCULAR | Status: DC | PRN

## 2016-05-13 MED ORDER — PROPOFOL 10 MG/ML IV BOLUS
INTRAVENOUS | Status: AC
  Filled 2016-05-13: qty 60

## 2016-05-13 MED ORDER — SODIUM CHLORIDE 0.9% FLUSH
10.0000 mL | Freq: Two times a day (BID) | INTRAVENOUS | Status: DC
  Administered 2016-05-13: 10 mL
  Administered 2016-05-14: 30 mL

## 2016-05-13 MED ORDER — METHOCARBAMOL 500 MG PO TABS
500.0000 mg | ORAL_TABLET | Freq: Four times a day (QID) | ORAL | Status: DC | PRN
  Administered 2016-05-14: 500 mg via ORAL
  Filled 2016-05-13: qty 1

## 2016-05-13 MED ORDER — KETOROLAC TROMETHAMINE 15 MG/ML IJ SOLN
7.5000 mg | Freq: Four times a day (QID) | INTRAMUSCULAR | Status: AC
  Administered 2016-05-13 – 2016-05-14 (×4): 7.5 mg via INTRAVENOUS
  Filled 2016-05-13 (×4): qty 1

## 2016-05-13 MED ORDER — POLYETHYLENE GLYCOL 3350 17 G PO PACK: 17.0000 g | PACK | Freq: Every day | ORAL | Status: DC | PRN

## 2016-05-13 MED ORDER — SODIUM CHLORIDE 0.9 % IV SOLN
INTRAVENOUS | Status: DC
  Administered 2016-05-13: 19:00:00 via INTRAVENOUS

## 2016-05-13 MED ORDER — SODIUM CHLORIDE 0.9 % IV SOLN
1000.0000 mg | Freq: Once | INTRAVENOUS | Status: AC
  Administered 2016-05-13: 1000 mg via INTRAVENOUS
  Filled 2016-05-13: qty 10

## 2016-05-13 MED ORDER — MIDAZOLAM HCL 5 MG/5ML IJ SOLN
INTRAMUSCULAR | Status: DC | PRN
  Administered 2016-05-13: .5 mg via INTRAVENOUS
  Administered 2016-05-13: 1 mg via INTRAVENOUS
  Administered 2016-05-13: .5 mg via INTRAVENOUS

## 2016-05-13 MED ORDER — MAGNESIUM HYDROXIDE 400 MG/5ML PO SUSP: 15.0000 mL | Freq: Every day | ORAL | Status: DC | PRN

## 2016-05-13 MED ORDER — OXYMETAZOLINE HCL 0.05 % NA SOLN
1.0000 | Freq: Two times a day (BID) | NASAL | Status: DC | PRN
  Filled 2016-05-13: qty 15

## 2016-05-13 MED ORDER — BUPIVACAINE HCL (PF) 0.25 % IJ SOLN
INTRAMUSCULAR | Status: AC
  Filled 2016-05-13: qty 30

## 2016-05-13 MED ORDER — ISOPROPYL ALCOHOL 70 % SOLN
Status: DC | PRN
  Administered 2016-05-13: 1 via TOPICAL

## 2016-05-13 MED ORDER — HYDROGEN PEROXIDE 3 % EX SOLN
CUTANEOUS | Status: DC | PRN
  Administered 2016-05-13: 1

## 2016-05-13 MED ORDER — PROMETHAZINE HCL 25 MG/ML IJ SOLN: 6.2500 mg | INTRAMUSCULAR | Status: DC | PRN

## 2016-05-13 MED ORDER — BUPIVACAINE HCL (PF) 0.5 % IJ SOLN
INTRAMUSCULAR | Status: DC | PRN
  Administered 2016-05-13: 3 mL

## 2016-05-13 MED ORDER — FENTANYL CITRATE (PF) 100 MCG/2ML IJ SOLN
INTRAMUSCULAR | Status: DC | PRN
Start: 2016-05-13 — End: 2016-05-13
  Administered 2016-05-13: 50 ug via INTRAVENOUS
  Administered 2016-05-13: 25 ug via INTRAVENOUS
  Administered 2016-05-13 (×2): 50 ug via INTRAVENOUS
  Administered 2016-05-13: 25 ug via INTRAVENOUS

## 2016-05-13 MED ORDER — PHENYLEPHRINE 40 MCG/ML (10ML) SYRINGE FOR IV PUSH (FOR BLOOD PRESSURE SUPPORT)
PREFILLED_SYRINGE | INTRAVENOUS | Status: AC
Start: 2016-05-13 — End: 2016-05-13
  Filled 2016-05-13: qty 10

## 2016-05-13 MED ORDER — SIMETHICONE 80 MG PO CHEW: 80.0000 mg | CHEWABLE_TABLET | Freq: Four times a day (QID) | ORAL | Status: DC | PRN

## 2016-05-13 MED ORDER — ACETAMINOPHEN 10 MG/ML IV SOLN
1000.0000 mg | INTRAVENOUS | Status: AC
  Administered 2016-05-13: 1000 mg via INTRAVENOUS

## 2016-05-13 MED ORDER — LINACLOTIDE 72 MCG PO CAPS: 72.0000 ug | ORAL_CAPSULE | Freq: Every day | ORAL | Status: DC

## 2016-05-13 SURGICAL SUPPLY — 47 items
ADH SKN CLS APL DERMABOND .7 (GAUZE/BANDAGES/DRESSINGS) ×2
BAG DECANTER FOR FLEXI CONT (MISCELLANEOUS) IMPLANT
BAG SPEC THK2 15X12 ZIP CLS (MISCELLANEOUS)
BAG ZIPLOCK 12X15 (MISCELLANEOUS) IMPLANT
BLADE 15 SAFETY STRL DISP (BLADE) ×1 IMPLANT
CAPT HIP TOTAL 2 ×1 IMPLANT
CHLORAPREP W/TINT 26ML (MISCELLANEOUS) ×2 IMPLANT
CLOTH BEACON ORANGE TIMEOUT ST (SAFETY) ×2 IMPLANT
COVER PERINEAL POST (MISCELLANEOUS) ×2 IMPLANT
DECANTER SPIKE VIAL GLASS SM (MISCELLANEOUS) ×2 IMPLANT
DERMABOND ADVANCED (GAUZE/BANDAGES/DRESSINGS) ×2
DERMABOND ADVANCED .7 DNX12 (GAUZE/BANDAGES/DRESSINGS) ×2 IMPLANT
DRAPE SHEET LG 3/4 BI-LAMINATE (DRAPES) ×5 IMPLANT
DRAPE STERI IOBAN 125X83 (DRAPES) ×2 IMPLANT
DRAPE U-SHAPE 47X51 STRL (DRAPES) ×4 IMPLANT
DRSG AQUACEL AG ADV 3.5X10 (GAUZE/BANDAGES/DRESSINGS) ×2 IMPLANT
ELECT PENCIL ROCKER SW 15FT (MISCELLANEOUS) ×2 IMPLANT
ELECT REM PT RETURN 15FT ADLT (MISCELLANEOUS) ×2 IMPLANT
GAUZE SPONGE 4X4 12PLY STRL (GAUZE/BANDAGES/DRESSINGS) ×2 IMPLANT
GLOVE BIO SURGEON STRL SZ8.5 (GLOVE) ×4 IMPLANT
GLOVE BIOGEL PI IND STRL 8.5 (GLOVE) ×1 IMPLANT
GLOVE BIOGEL PI INDICATOR 8.5 (GLOVE) ×1
GOWN SPEC L3 XXLG W/TWL (GOWN DISPOSABLE) ×2 IMPLANT
HANDPIECE INTERPULSE COAX TIP (DISPOSABLE) ×2
HOLDER FOLEY CATH W/STRAP (MISCELLANEOUS) ×2 IMPLANT
HOOD PEEL AWAY FLYTE STAYCOOL (MISCELLANEOUS) ×4 IMPLANT
LINER NEUTRAL 52X36MM PLUS 4 (Liner) ×1 IMPLANT
MARKER SKIN DUAL TIP RULER LAB (MISCELLANEOUS) ×2 IMPLANT
NDL SPNL 18GX3.5 QUINCKE PK (NEEDLE) ×1 IMPLANT
NEEDLE SPNL 18GX3.5 QUINCKE PK (NEEDLE) ×2 IMPLANT
PACK ANTERIOR HIP CUSTOM (KITS) ×2 IMPLANT
SAW OSC TIP CART 19.5X105X1.3 (SAW) ×2 IMPLANT
SEALER BIPOLAR AQUA 6.0 (INSTRUMENTS) ×2 IMPLANT
SET HNDPC FAN SPRY TIP SCT (DISPOSABLE) ×1 IMPLANT
SOL PREP POV-IOD 4OZ 10% (MISCELLANEOUS) ×2 IMPLANT
SUT ETHIBOND NAB CT1 #1 30IN (SUTURE) ×4 IMPLANT
SUT MNCRL AB 3-0 PS2 18 (SUTURE) ×2 IMPLANT
SUT MON AB 2-0 CT1 36 (SUTURE) ×4 IMPLANT
SUT STRATAFIX PDO 1 14 VIOLET (SUTURE) ×2
SUT STRATFX PDO 1 14 VIOLET (SUTURE) ×1
SUT VIC AB 2-0 CT1 27 (SUTURE) ×2
SUT VIC AB 2-0 CT1 TAPERPNT 27 (SUTURE) ×1 IMPLANT
SUTURE STRATFX PDO 1 14 VIOLET (SUTURE) ×1 IMPLANT
SYR 50ML LL SCALE MARK (SYRINGE) ×2 IMPLANT
TRAY FOLEY W/METER SILVER 16FR (SET/KITS/TRAYS/PACK) IMPLANT
WATER STERILE IRR 1500ML POUR (IV SOLUTION) ×2 IMPLANT
YANKAUER SUCT BULB TIP 10FT TU (MISCELLANEOUS) ×2 IMPLANT

## 2016-05-13 NOTE — Transfer of Care (Signed)
Immediate Anesthesia Transfer of Care Note  Patient: Connie Lang  Procedure(s) Performed: Procedure(s) with comments: RIGHT TOTAL HIP ARTHROPLASTY ANTERIOR APPROACH (Right) - [place] RNFA  Patient Location: PACU  Anesthesia Type:Spinal  Level of Consciousness: awake, alert , oriented and patient cooperative  Airway & Oxygen Therapy: Patient Spontanous Breathing and Patient connected to face mask oxygen  Post-op Assessment: Report given to RN, Post -op Vital signs reviewed and stable and Patient moving all extremities X 4  Post vital signs: stable  Last Vitals:  Vitals:   05/13/16 1046 05/13/16 1530  BP: 140/77 (P) 108/66  Pulse: 95   Resp: 18 (!) (P) 21  Temp: 36.4 C (P) 36.4 C    Last Pain:  Vitals:   05/13/16 1116  TempSrc:   PainSc: 5       Patients Stated Pain Goal: 3 (70/[id number]/43 [id number])  Complications: No apparent anesthesia complications  Moves all extremities

## 2016-05-13 NOTE — Op Note (Signed)
OPERATIVE REPORT  SURGEON: Rod Can, MD   ASSISTANT: Staff.  PREOPERATIVE DIAGNOSIS: Right hip arthritis.   POSTOPERATIVE DIAGNOSIS: Right hip arthritis.   PROCEDURE: Right total hip arthroplasty, anterior approach.   IMPLANTS: DePuy Tri Lock stem, size 4, hi offset. DePuy Pinnacle Cup, size 52 mm. DePuy Altrx liner, size 36 by 52 mm, +4 neutral. DePuy Biolox ceramic head ball, size 36 + 1.5 mm.  ANESTHESIA:  Spinal  ESTIMATED BLOOD LOSS: 450 mL.  ANTIBIOTICS: 2 g Ancef.  DRAINS: None.  COMPLICATIONS: None.   CONDITION: PACU - hemodynamically stable.   BRIEF CLINICAL NOTE: Connie Lang is a 75 y.o. female with a long-standing history of Right hip arthritis. After failing conservative management, the patient was indicated for total hip arthroplasty. The risks, benefits, and alternatives to the procedure were explained, and the patient elected to proceed.  PROCEDURE IN DETAIL: Surgical site was marked by myself. Spinal anesthesia was obtained in the pre-op holding area. Once inside the operative room, a foley catheter was inserted. The patient was then positioned on the [place] table. All bony prominences were well padded. The hip was prepped and draped in the normal sterile surgical fashion. A time-out was called verifying side and site of surgery. The patient received IV antibiotics within 60 minutes of beginning the procedure.  The direct anterior approach to the hip was performed through the Hueter interval. Lateral femoral circumflex vessels were treated with the Auqumantys. The anterior capsule was exposed and an inverted T capsulotomy was made.The femoral neck cut was made to the level of the templated cut. A corkscrew was placed into the head and the head was removed. The femoral head was found to have eburnated bone. The head was passed to the back table and was measured.  Acetabular exposure was achieved, and the pulvinar and labrum were excised.  Sequental reaming of the acetabulum was then performed up to a size 51 mm reamer. A 52 mm cup was then opened and impacted into place at approximately 40 degrees of abduction and 20 degrees of anteversion. The final polyethylene liner was impacted into place and acetabular osteophytes were removed.   I then gained femoral exposure taking care to protect the abductors and greater trochanter. This was performed using standard external rotation, extension, and adduction. The capsule was peeled off the inner aspect of the greater trochanter, taking care to preserve the short external rotators. A cookie cutter was used to enter the femoral canal, and then the femoral canal finder was placed. Sequential broaching was performed up to a size 4. Calcar planer was used on the femoral neck remnant. I placed a hi offset neck and a trial head ball. The hip was reduced. Leg lengths and offset were checked fluoroscopically. The hip was dislocated and trial components were removed. The final implants were placed, and the hip was reduced.  Fluoroscopy was used to confirm component position and leg lengths. At 90 degrees of external rotation and full extension, the hip was stable to an anterior directed force.  The wound was copiously irrigated with a dilute betadine solution followed by normal saline. Marcaine solution was injected into the periarticular soft tissue. The wound was closed in layers using #1 Vicryl and V-Loc for the fascia, 2-0 Vicryl for the subcutaneous fat, 2-0 Monocryl for the deep dermal layer, 3-0 running Monocryl subcuticular stitch, and Dermabond for the skin. Once the glue was fully dried, an Aquacell Ag dressing was applied. The patient was transported to the recovery room  in stable condition. Sponge, needle, and instrument counts were correct at the end of the case x2. The patient tolerated the procedure well and there were no known complications.

## 2016-05-13 NOTE — Anesthesia Postprocedure Evaluation (Signed)
Anesthesia Post Note  Patient: Connie Lang  Procedure(s) Performed: Procedure(s) (LRB): RIGHT TOTAL HIP ARTHROPLASTY ANTERIOR APPROACH (Right)  Patient location during evaluation: PACU Anesthesia Type: Spinal Level of consciousness: oriented and awake and alert Pain management: pain level controlled Vital Signs Assessment: post-procedure vital signs reviewed and stable Respiratory status: spontaneous breathing, respiratory function stable and patient connected to nasal cannula oxygen Cardiovascular status: blood pressure returned to baseline and stable Postop Assessment: no headache, no backache and spinal receding Anesthetic complications: no    Last Vitals:  Vitals:   05/13/16 1615 05/13/16 1626  BP: 123/61 119/65  Pulse: 80 77  Resp: 12 (!) 0  Temp:      Last Pain:  Vitals:   05/13/16 1605  TempSrc:   PainSc: 7                  Markail Diekman,JAMES TERRILL

## 2016-05-13 NOTE — H&P (View-Only) (Signed)
TOTAL HIP ADMISSION H&P  Patient is admitted for right total hip arthroplasty.  Subjective:  Chief Complaint: right hip pain  HPI: Connie Lang, 75 y.o. female, has a history of pain and functional disability in the right hip(s) due to arthritis and patient has failed non-surgical conservative treatments for greater than 12 weeks to include NSAID's and/or analgesics, flexibility and strengthening excercises, use of assistive devices, weight reduction as appropriate and activity modification.  Onset of symptoms was gradual starting 2 years ago with gradually worsening course since that time.The patient noted no past surgery on the right hip(s).  Patient currently rates pain in the right hip at 10 out of 10 with activity. Patient has night pain, worsening of pain with activity and weight bearing, pain that interfers with activities of daily living and crepitus. Patient has evidence of subchondral cysts, subchondral sclerosis, periarticular osteophytes and joint space narrowing by imaging studies. This condition presents safety issues increasing the risk of falls. There is no current active infection.  Patient Active Problem List   Diagnosis Date Noted  . Family history of ovarian cancer 01/27/2016  . Family history of colon cancer 01/27/2016  . Breast cancer of upper-outer quadrant of right female breast ([place]) 12/03/2015  . Generalized edema 11/21/2015  . PMR (polymyalgia rheumatica) (HCC) 07/23/2015  . RBBB (right bundle branch block) 11/01/2013  . HTN (hypertension) 11/01/2013  . Obesity, unspecified 11/01/2013  . Pure hypercholesterolemia 11/01/2013  . CARCINOMA, BASAL CELL 07/29/2009  . HYPERLIPIDEMIA 07/29/2009  . GLAUCOMA 07/29/2009  . HYPERTENSION 07/29/2009  . Osteoarthritis 07/29/2009  . COLONIC POLYPS, HX OF 07/29/2009   Past Medical History:  Diagnosis Date  . Allergy    occasionally has hayfever  . Anxiety   . Breast cancer ([place]) 2017  . Cancer ([place]) 1987    cancerous cells in vaginal wall seemed to be metastatic from the gut but no primary was ever found   . Chicken pox   . Constipation   . Depression   . DJD (degenerative joint disease)   . GERD (gastroesophageal reflux disease)   . Glaucoma    glaucoma and cataracts sees Dr Satira Sark   . Hyperlipemia   . Hypertension   . Lumbar disc disease    sees Dr. Sherwood Gambler   . Osteoarthritis   . Osteopenia   . PMR (polymyalgia rheumatica) (HCC)    sees Dr. Leigh Aurora   . Radiation    02-25-16 last radiation for breast cancer  . Transfusion history    blood     Past Surgical History:  Procedure Laterality Date  . ABDOMINAL HYSTERECTOMY     TAH BSO 1985  . APPENDECTOMY    . BASAL CELL CARCINOMA EXCISION     nose and rt thigh  . BREAST BIOPSY      x 4  . BREAST LUMPECTOMY WITH RADIOACTIVE SEED LOCALIZATION Right 12/25/2015   Procedure: RIGHT BREAST LUMPECTOMY WITH RADIOACTIVE SEED LOCALIZATION;  Surgeon: Rolm Bookbinder, MD;  Location: [facility];  Service: General;  Laterality: Right;  . CATARACT EXTRACTION     01-27-2011 left, 02-17-2011 right   . CERVICAL LAMINECTOMY     1983  . cervical microdiskectomy     1990 Dr Jovita Gamma  . CESAREAN SECTION    . COLONOSCOPY  02/14/2006   in [place], clear, repeat in 10 yrs - 3 colons in the past 1 with polyps that were benign- last colon no polyps   . FOOT ARTHRODESIS, MODIFIED [facility]    .  LUMBAR LAMINECTOMY     1990 Dr Jovita Gamma  . MYELOGRAM    . POLYPECTOMY    . REFRACTIVE SURGERY     01-29-2009 04-07-2009 Dr Foye Clock in [place]  . spinal injection     07-30-2012, 09-24-2012  . TENDON REPAIR     rt wrist  . VAGINAL BIRTH AFTER CESAREAN SECTION     x3     (Not in a hospital admission) No Known Allergies  Social History  Substance Use Topics  . Smoking status: Former Smoker    Packs/day: 0.50    Years: 36.00    Types: Cigarettes    Quit date: 07/06/1995  . Smokeless tobacco: Never Used  . Alcohol  use No     Comment: no wine at present     Family History  Problem Relation Age of Onset  . Alzheimer's disease Father   . Heart failure Father     d. 61  . Stroke Mother 31  . Cancer Sister     ovarian  . Alzheimer's disease Paternal Aunt   . Breast cancer Paternal Aunt   . Colon cancer Brother     dx. 57s  . Stroke Maternal Aunt     d. 39s  . Heart disease Maternal Grandfather   . Diabetes Maternal Grandfather   . Stroke Paternal Grandmother 73  . Heart attack Paternal Grandfather     d. 78s  . Ovarian cancer Sister     dx. 66s; no genetic testing  . Leukemia Grandchild 21  . Skin cancer Grandchild   . Cervical cancer Cousin 52    maternal 1st cousin  . Alzheimer's disease Paternal Aunt     (x4) paternal aunts  . Heart attack Paternal Uncle 80  . Colon polyps Neg Hx   . Rectal cancer Neg Hx   . Stomach cancer Neg Hx      Review of Systems  Constitutional: Negative.   HENT: Negative.   Eyes: Positive for blurred vision.  Respiratory: Negative.   Cardiovascular: Negative.   Gastrointestinal: Positive for heartburn.  Genitourinary: Negative.   Musculoskeletal: Positive for joint pain.  Skin: Negative.   Neurological: Negative.   Endo/Heme/Allergies: Negative.   Psychiatric/Behavioral: Negative.     Objective:  Physical Exam  Vitals reviewed. Constitutional: She is oriented to person, place, and time. She appears well-developed and well-nourished.  HENT:  Head: Normocephalic and atraumatic.  Eyes: Conjunctivae and EOM are normal. Pupils are equal, round, and reactive to light.  Neck: Normal range of motion. Neck supple.  Cardiovascular: Normal rate, regular rhythm and intact distal pulses.   Respiratory: Effort normal. No respiratory distress.  GI: Soft. She exhibits no distension.  Genitourinary:  Genitourinary Comments: deferred  Neurological: She is alert and oriented to person, place, and time. She has normal reflexes.  Skin: Skin is warm and dry.   Psychiatric: She has a normal mood and affect. Her behavior is normal. Judgment and thought content normal.    Vital signs in last 24 hours: '@VSRANGES'$ @  Labs:   Estimated body mass index is 33.33 kg/m as calculated from the following:   Height as of 04/13/16: '5\' 4"'$  (1.626 m).   Weight as of 04/13/16: 88.1 kg (194 lb 3.2 oz).   Imaging Review Plain radiographs demonstrate severe degenerative joint disease of the right hip(s). The bone quality appears to be adequate for age and reported activity level.  Assessment/Plan:  End stage arthritis, right hip(s)  The patient history, physical examination,  clinical judgement of the provider and imaging studies are consistent with end stage degenerative joint disease of the right hip(s) and total hip arthroplasty is deemed medically necessary. The treatment options including medical management, injection therapy, arthroscopy and arthroplasty were discussed at length. The risks and benefits of total hip arthroplasty were presented and reviewed. The risks due to aseptic loosening, infection, stiffness, dislocation/subluxation,  thromboembolic complications and other imponderables were discussed.  The patient acknowledged the explanation, agreed to proceed with the plan and consent was signed. Patient is being admitted for inpatient treatment for surgery, pain control, PT, OT, prophylactic antibiotics, VTE prophylaxis, progressive ambulation and ADL's and discharge planning.The patient is planning to be discharged home with home health services.

## 2016-05-13 NOTE — Discharge Instructions (Signed)
°Dr. Dazja Houchin °Joint Replacement Specialist °[facility] °[address] °[place], [place] [address] °[phone] ° ° °TOTAL HIP REPLACEMENT POSTOPERATIVE DIRECTIONS ° ° ° °Hip Rehabilitation, Guidelines Following Surgery  ° °WEIGHT BEARING °Weight bearing as tolerated with assist device (walker, cane, etc) as directed, use it as long as suggested by your surgeon or therapist, typically at least 4-6 weeks. ° °The results of a hip operation are greatly improved after range of motion and muscle strengthening exercises. Follow all safety measures which are given to protect your hip. If any of these exercises cause increased pain or swelling in your joint, decrease the amount until you are comfortable again. Then slowly increase the exercises. Call your caregiver if you have problems or questions.  ° °HOME CARE INSTRUCTIONS  °Most of the following instructions are designed to prevent the dislocation of your new hip.  °Remove items at home which could result in a fall. This includes throw rugs or furniture in walking pathways.  °Continue medications as instructed at time of discharge. °· You may have some home medications which will be placed on hold until you complete the course of blood thinner medication. °· You may start showering once you are discharged home. Do not remove your dressing. °Do not put on socks or shoes without following the instructions of your caregivers.   °Sit on chairs with arms. Use the chair arms to help push yourself up when arising.  °Arrange for the use of a toilet seat elevator so you are not sitting low.  °· Walk with walker as instructed.  °You may resume a sexual relationship in one month or when given the OK by your caregiver.  °Use walker as long as suggested by your caregivers.  °You may put full weight on your legs and walk as much as is comfortable. °Avoid periods of inactivity such as sitting longer than an hour when not asleep. This helps prevent  blood clots.  °You may return to work once you are cleared by your surgeon.  °Do not drive a car for 6 weeks or until released by your surgeon.  °Do not drive while taking narcotics.  °Wear elastic stockings for two weeks following surgery during the day but you may remove then at night.  °Make sure you keep all of your appointments after your operation with all of your doctors and caregivers. You should call the office at the above phone number and make an appointment for approximately two weeks after the date of your surgery. °Please pick up a stool softener and laxative for home use as long as you are requiring pain medications. °· ICE to the affected hip every three hours for 30 minutes at a time and then as needed for pain and swelling. Continue to use ice on the hip for pain and swelling from surgery. You may notice swelling that will progress down to the foot and ankle.  This is normal after surgery.  Elevate the leg when you are not up walking on it.   °It is important for you to complete the blood thinner medication as prescribed by your doctor. °· Continue to use the breathing machine which will help keep your temperature down.  It is common for your temperature to cycle up and down following surgery, especially at night when you are not up moving around and exerting yourself.  The breathing machine keeps your lungs expanded and your temperature down. ° °RANGE OF MOTION AND STRENGTHENING EXERCISES  °These exercises are   designed to help you keep full movement of your hip joint. Follow your caregiver's or physical therapist's instructions. Perform all exercises about fifteen times, three times per day or as directed. Exercise both hips, even if you have had only one joint replacement. These exercises can be done on a training (exercise) mat, on the floor, on a table or on a bed. Use whatever works the best and is most comfortable for you. Use music or television while you are exercising so that the exercises  are a pleasant break in your day. This will make your life better with the exercises acting as a break in routine you can look forward to.  °Lying on your back, slowly slide your foot toward your buttocks, raising your knee up off the floor. Then slowly slide your foot back down until your leg is straight again.  °Lying on your back spread your legs as far apart as you can without causing discomfort.  °Lying on your side, raise your upper leg and foot straight up from the floor as far as is comfortable. Slowly lower the leg and repeat.  °Lying on your back, tighten up the muscle in the front of your thigh (quadriceps muscles). You can do this by keeping your leg straight and trying to raise your heel off the floor. This helps strengthen the largest muscle supporting your knee.  °Lying on your back, tighten up the muscles of your buttocks both with the legs straight and with the knee bent at a comfortable angle while keeping your heel on the floor.  ° °SKILLED REHAB INSTRUCTIONS: °If the patient is transferred to a skilled rehab facility following release from the hospital, a list of the current medications will be sent to the facility for the patient to continue.  When discharged from the skilled rehab facility, please have the facility set up the patient's Home Health Physical Therapy prior to being released. Also, the skilled facility will be responsible for providing the patient with their medications at time of release from the facility to include their pain medication and their blood thinner medication. If the patient is still at the rehab facility at time of the two week follow up appointment, the skilled rehab facility will also need to assist the patient in arranging follow up appointment in our office and any transportation needs. ° °MAKE SURE YOU:  °Understand these instructions.  °Will watch your condition.  °Will get help right away if you are not doing well or get worse. ° °Pick up stool softner and  laxative for home use following surgery while on pain medications. °Do not remove your dressing. °The dressing is waterproof--it is OK to take showers. °Continue to use ice for pain and swelling after surgery. °Do not use any lotions or creams on the incision until instructed by your surgeon. °Total Hip Protocol. ° ° °

## 2016-05-13 NOTE — Interval H&P Note (Signed)
History and Physical Interval Note:  05/13/2016 12:47 PM  Connie Lang  has presented today for surgery, with the diagnosis of DJD RIGHT HIP  The various methods of treatment have been discussed with the patient and family. After consideration of risks, benefits and other options for treatment, the patient has consented to  Procedure(s) with comments: [place] (Right) - [place] RNFA as a surgical intervention .  The patient's history has been reviewed, patient examined, no change in status, stable for surgery.  I have reviewed the patient's chart and labs.  Questions were answered to the patient's satisfaction.     Zolton Dowson, Horald Pollen

## 2016-05-13 NOTE — Anesthesia Procedure Notes (Signed)
Spinal  Patient location during procedure: OR End time: 05/13/2016 1:12 PM Staffing Performed: anesthesiologist  Preanesthetic Checklist Completed: patient identified, site marked, surgical consent, pre-op evaluation, timeout performed, IV checked, risks and benefits discussed and monitors and equipment checked Spinal Block Patient position: sitting Prep: Betadine Patient monitoring: heart rate, continuous pulse ox and blood pressure Location: L3-4 Injection technique: single-shot Needle Needle type: Pencan  Needle gauge: 25 G Needle length: 9 cm Assessment Sensory level: T4 Additional Notes Expiration date of kit checked and confirmed. Patient tolerated procedure well, without complications.

## 2016-05-13 NOTE — Anesthesia Preprocedure Evaluation (Signed)
Anesthesia Evaluation  Patient identified by MRN, date of birth, ID band Patient awake    Reviewed: Allergy & Precautions, NPO status , Patient's Chart, lab work & pertinent test results  History of Anesthesia Complications Negative for: history of anesthetic complications  Airway Mallampati: III  TM Distance: <3 FB Neck ROM: Limited  Mouth opening: Limited Mouth Opening  Dental  (+) Teeth Intact, Dental Advisory Given   Pulmonary neg pulmonary ROS, former smoker,    breath sounds clear to auscultation       Cardiovascular hypertension,  Rhythm:Regular Rate:Normal     Neuro/Psych negative neurological ROS     GI/Hepatic GERD  ,  Endo/Other  Morbid obesity  Renal/GU      Musculoskeletal  (+) Arthritis ,   Abdominal (+) + obese,   Peds  Hematology   Anesthesia Other Findings   Reproductive/Obstetrics                             Anesthesia Physical Anesthesia Plan  ASA: III  Anesthesia Plan: Spinal   Post-op Pain Management:    Induction: Intravenous  Airway Management Planned: Simple Face Mask and Natural Airway  Additional Equipment:   Intra-op Plan:   Post-operative Plan:   Informed Consent: I have reviewed the patients History and Physical, chart, labs and discussed the procedure including the risks, benefits and alternatives for the proposed anesthesia with the patient or authorized representative who has indicated his/her understanding and acceptance.   Dental advisory given  Plan Discussed with: CRNA  Anesthesia Plan Comments:         Anesthesia Quick Evaluation

## 2016-05-14 LAB — BASIC METABOLIC PANEL
ANION GAP: 8 (ref 5–15)
BUN: 12 mg/dL (ref 6–20)
CO2: 24 mmol/L (ref 22–32)
CREATININE: 0.72 mg/dL (ref 0.44–1.00)
Calcium: 8.9 mg/dL (ref 8.9–10.3)
Chloride: 106 mmol/L (ref 101–111)
GLUCOSE: 198 mg/dL — AB (ref 65–99)
Potassium: 4.7 mmol/L (ref 3.5–5.1)
Sodium: 138 mmol/L (ref 135–145)

## 2016-05-14 LAB — CBC
HCT: 31.1 % — ABNORMAL LOW (ref 36.0–46.0)
Hemoglobin: 10.5 g/dL — ABNORMAL LOW (ref 12.0–15.0)
MCH: 31.3 pg (ref 26.0–34.0)
MCHC: 33.8 g/dL (ref 30.0–36.0)
MCV: 92.6 fL (ref 78.0–100.0)
PLATELETS: 167 10*3/uL (ref 150–400)
RBC: 3.36 MIL/uL — ABNORMAL LOW (ref 3.87–5.11)
RDW: 12.2 % (ref 11.5–15.5)
WBC: 10.8 10*3/uL — AB (ref 4.0–10.5)

## 2016-05-14 MED ORDER — HYDROCODONE-ACETAMINOPHEN 10-325 MG PO TABS: 1.0000 | ORAL_TABLET | ORAL | 0 refills | Status: DC | PRN

## 2016-05-14 MED ORDER — ONDANSETRON HCL 4 MG PO TABS: 4.0000 mg | ORAL_TABLET | Freq: Four times a day (QID) | ORAL | 0 refills | Status: DC | PRN

## 2016-05-14 MED ORDER — ASPIRIN 81 MG PO CHEW: 81.0000 mg | CHEWABLE_TABLET | Freq: Two times a day (BID) | ORAL | 1 refills | Status: DC

## 2016-05-14 MED ORDER — DOCUSATE SODIUM 100 MG PO CAPS: 100.0000 mg | ORAL_CAPSULE | Freq: Two times a day (BID) | ORAL | 0 refills | Status: DC

## 2016-05-14 MED ORDER — METHOCARBAMOL 500 MG PO TABS: 500.0000 mg | ORAL_TABLET | Freq: Four times a day (QID) | ORAL | 0 refills | Status: DC | PRN

## 2016-05-14 MED ORDER — SENNA 8.6 MG PO TABS: 2.0000 | ORAL_TABLET | Freq: Every day | ORAL | 0 refills | Status: DC

## 2016-05-14 NOTE — Evaluation (Signed)
Physical Therapy Evaluation Patient Details Name: Connie Lang MRN: [id number] DOB: [birth year] Today's Date: 05/14/2016   History of Present Illness  s/p R DA THA  H/O polymyalgia rheumatica, breast CA  Clinical Impression  The  Patient is progressing well. Ambulates with  o pain. Will practice steps after a break. Pt admitted with above diagnosis. Pt currently with functional limitations due to the deficits listed below (see PT Problem List).Pt will benefit from skilled PT to increase their independence and safety with mobility to allow discharge to the venue listed below.       Follow Up Recommendations Home health PT;Supervision/Assistance - 24 hour    Equipment Recommendations  3in1 (PT)    Recommendations for Other Services       Precautions / Restrictions Precautions Precautions: Fall      Mobility  Bed Mobility Overal bed mobility: Needs Assistance Bed Mobility: Supine to Sit;Sit to Supine     Supine to sit: Supervision Sit to supine: Min guard   General bed mobility comments: cues to protect the neck and back  Transfers Overall transfer level: Needs assistance Equipment used: Rolling walker (2 wheeled) Transfers: Sit to/from Stand Sit to Stand: Min guard         General transfer comment: cues for UE placement  Ambulation/Gait Ambulation/Gait assistance: Supervision Ambulation Distance (Feet): 300 Feet Assistive device: 4-wheeled walker Gait Pattern/deviations: Step-through pattern     General Gait Details: smoothe gait.  Stairs Stairs: Yes Stairs assistance: Min guard Stair Management: Step to pattern;One rail Right;One rail Left Number of Stairs: 3 General stair comments: cues for sequence  Wheelchair Mobility    Modified Rankin (Stroke Patients Only)       Balance                                             Pertinent Vitals/Pain Pain Score: 2  Pain Location: r incision Pain Descriptors / Indicators:  Burning Pain Intervention(s): Premedicated before session;Repositioned;Ice applied    Home Living Family/patient expects to be discharged to:: Private residence Living Arrangements: Spouse/significant other Available Help at Discharge: Family Type of Home: House Home Access: Stairs to enter   Technical brewer of Steps: 1 Home Layout: Two level        Prior Function                 Hand Dominance        Extremity/Trunk Assessment   Upper Extremity Assessment: Defer to OT evaluation           Lower Extremity Assessment: RLE deficits/detail RLE Deficits / Details: moves the leg during gait, lifts leg onto bed    Cervical / Trunk Assessment: Normal  Communication      Cognition Arousal/Alertness: Awake/alert                          General Comments      Exercises Total Joint Exercises Ankle Circles/Pumps: AROM;Both;10 reps Quad Sets: AROM;Both;10 reps Short Arc Quad: AROM;Right;15 reps Heel Slides: AROM;Right;15 reps Hip ABduction/ADduction: AROM;Right;15 reps   Assessment/Plan    PT Assessment Patient needs continued PT services  PT Problem List Decreased strength;Decreased range of motion;Decreased activity tolerance;Pain;Decreased knowledge of use of DME          PT Treatment Interventions DME instruction;Gait training;Stair training;Functional mobility training;Therapeutic activities;Therapeutic exercise;Patient/family education  PT Goals (Current goals can be found in the Care Plan section)  Acute Rehab PT Goals Patient Stated Goal: be able to travel again PT Goal Formulation: With patient/family Time For Goal Achievement: 05/15/16 Potential to Achieve Goals: Good    Frequency 7X/week   Barriers to discharge        Co-evaluation               End of Session   Activity Tolerance: Patient tolerated treatment well Patient left: in chair;with call bell/phone within reach;with family/visitor present Nurse  Communication: Mobility status         Time: 1219-7588 PT Time Calculation (min) (ACUTE ONLY): 35 min   Charges:   PT Evaluation $PT Eval Low Complexity: 1 Procedure PT Treatments $Gait Training: 8-22 mins   PT G Codes:        [name] 05/14/2016, 2:03 PM

## 2016-05-14 NOTE — Progress Notes (Signed)
Physical Therapy Treatment Patient Details Name: Connie Lang MRN: [id number] DOB: 10/22/1940 Today's Date: 05/14/2016    History of Present Illness s/p R DA THA  H/O polymyalgia rheumatica, breast CA    PT Comments    The patient is progressing well. Taking steps without the RW. Cautioned her to keep RW for a few days.  Follow Up Recommendations  Home health PT;Supervision/Assistance - 24 hour     Equipment Recommendations  3in1 (PT)    Recommendations for Other Services       Precautions / Restrictions Precautions Precautions: Fall    Mobility  Bed Mobility   Bed Mobility: Sit to Supine       Sit to supine: Supervision   General bed mobility comments: cues to protect the neck and back  Transfers   Equipment used: 4-wheeled walker Transfers: Sit to/from Stand Sit to Stand: Supervision         General transfer comment: cues for UE placement  Ambulation/Gait Ambulation/Gait assistance: Supervision Ambulation Distance (Feet): 300 Feet ( x 2)             Stairs Stairs: Yes Stairs assistance: Min guard Stair Management: With cane;One rail Right Number of Stairs: 4 General stair comments: cues for sequence  Wheelchair Mobility    Modified Rankin (Stroke Patients Only)       Balance                                    Cognition Arousal/Alertness: Awake/alert                          Exercises Total Joint Exercises Ankle Circles/Pumps: AROM;Both;10 reps Quad Sets: AROM;Both;10 reps Short Arc Quad: AROM;Right;15 reps Heel Slides: AROM;Right;15 reps Hip ABduction/ADduction: AROM;Right;15 reps    General Comments        Pertinent Vitals/Pain Pain Assessment: No/denies pain Pain Descriptors / Indicators: Burning    Home Living Family/patient expects to be discharged to:: Private residence Living Arrangements: Spouse/significant other Available Help at Discharge: Family Type of Home: House Home  Access: Stairs to enter   Home Layout: Two level        Prior Function            PT Goals (current goals can now be found in the care plan section) Acute Rehab PT Goals Patient Stated Goal: be able to travel again PT Goal Formulation: With patient/family Time For Goal Achievement: 05/15/16 Potential to Achieve Goals: Good Progress towards PT goals: Progressing toward goals    Frequency    7X/week      PT Plan Current plan remains appropriate    Co-evaluation             End of Session   Activity Tolerance: Patient tolerated treatment well Patient left: in bed;with call bell/phone within reach     Time: 1155-1220 PT Time Calculation (min) (ACUTE ONLY): 25 min  Charges:  $Gait Training: 23-37 mins                    G Codes:      Connie Lang 05/14/2016, 2:54 PM

## 2016-05-14 NOTE — Progress Notes (Signed)
   Subjective:  Patient reports pain as mild to moderate.  PACU x-ray findings discussed with patient.  Objective:   VITALS:   Vitals:   05/14/16 0305 05/14/16 0624 05/14/16 0919 05/14/16 1422  BP: 117/65 (!) 150/76 114/64 (!) 105/57  Pulse: (!) 101 (!) 108 (!) 105 (!) 106  Resp: '16 18 16 17  '$ Temp: 98.6 F (37 C) 98.1 F (36.7 C) 99.4 F (37.4 C) 98.9 F (37.2 C)  TempSrc: Oral Oral Oral Oral  SpO2: 96% 97% 91% 93%  Weight:      Height:        NAD ABD soft Sensation intact distally Intact pulses distally Dorsiflexion/Plantar flexion intact Incision: dressing C/D/I Compartment soft   Lab Results  Component Value Date   WBC 10.8 (H) 05/14/2016   HGB 10.5 (L) 05/14/2016   HCT 31.1 (L) 05/14/2016   MCV 92.6 05/14/2016   PLT 167 05/14/2016   BMET    Component Value Date/Time   NA 138 05/14/2016 0419   NA 141 12/10/2015 1237   K 4.7 05/14/2016 0419   K 3.9 12/10/2015 1237   CL 106 05/14/2016 0419   CO2 24 05/14/2016 0419   CO2 28 12/10/2015 1237   GLUCOSE 198 (H) 05/14/2016 0419   GLUCOSE 121 12/10/2015 1237   BUN 12 05/14/2016 0419   BUN 17.3 12/10/2015 1237   CREATININE 0.72 05/14/2016 0419   CREATININE 1.0 12/10/2015 1237   CALCIUM 8.9 05/14/2016 0419   CALCIUM 10.4 12/10/2015 1237   GFRNONAA >60 05/14/2016 0419   GFRAA >60 05/14/2016 0419     Assessment/Plan: 1 Day Post-Op   Principal Problem:   Primary osteoarthritis of right hip   WBAT ASA, SCDs, TEDs PO pain control PT/OT D/C home    Yuya Vanwingerden, Horald Pollen 05/14/2016, 4:34 PM   Rod Can, MD Cell [phone]

## 2016-05-14 NOTE — Evaluation (Signed)
Occupational Therapy Evaluation Patient Details Name: Connie Lang MRN: [id number] DOB: [birth year] Today's Date: 05/14/2016    History of Present Illness s/p R DA THA  H/O polymyalgia rheumatica, breast CA   Clinical Impression   This 75 year old female was admitted for the above sx. All education was completed. No further OT is needed at this time    Follow Up Recommendations  No OT follow up;Supervision/Assistance - 24 hour    Equipment Recommendations  None recommended by OT    Recommendations for Other Services       Precautions / Restrictions Precautions Precautions: Fall Restrictions Weight Bearing Restrictions: No      Mobility Bed Mobility               General bed mobility comments: supervision, HOB raised  Transfers Overall transfer level: Needs assistance Equipment used: Rolling walker (2 wheeled) Transfers: Sit to/from Stand Sit to Stand: Min guard         General transfer comment: cues for UE placement    Balance                                            ADL Overall ADL's : Needs assistance/impaired     Grooming: Wash/dry hands;Supervision/safety;Standing   Upper Body Bathing: Set up;Sitting   Lower Body Bathing: Minimal assistance;Sit to/from stand   Upper Body Dressing : Set up;Sitting   Lower Body Dressing: Moderate assistance;Sit to/from stand   Toilet Transfer: Min guard;Ambulation;BSC;RW   Toileting- Water quality scientist and Hygiene: Min guard;Sit to/from stand         General ADL Comments: performed adl in bathroom.  Pt verbalizes that she backs into shower and her husband assists her.  2 safety cues given for keeping walker in front of her and for not lifting it to work around Surveyor, minerals      Pertinent Vitals/Pain Pain Assessment: 0-10 Pain Score: 2  Pain Location: R hip Pain Descriptors / Indicators: Sore Pain Intervention(s): Limited  activity within patient's tolerance;Monitored during session;Premedicated before session;Repositioned (declined further ice)     Hand Dominance     Extremity/Trunk Assessment Upper Extremity Assessment Upper Extremity Assessment: LUE deficits/detail (painful; limited rotation and end range FF)           Communication Communication Communication: No difficulties   Cognition Arousal/Alertness: Awake/alert Behavior During Therapy: WFL for tasks assessed/performed Overall Cognitive Status: Within Functional Limits for tasks assessed                     General Comments       Exercises       Shoulder Instructions      Home Living Family/patient expects to be discharged to:: Private residence Living Arrangements: Spouse/significant other Available Help at Discharge: Family               Bathroom Shower/Tub: Walk-in shower   Bathroom Toilet: Handicapped height     Home Equipment: Environmental consultant - 4 wheels;Bedside commode;Shower seat   Additional Comments: has all DME; has had pain for a long time      Prior Functioning/Environment Level of Independence: Needs assistance        Comments: husband assists as needed        OT Problem List:  OT Treatment/Interventions:      OT Goals(Current goals can be found in the care plan section) Acute Rehab OT Goals Patient Stated Goal: be able to travel again OT Goal Formulation: All assessment and education complete, DC therapy  OT Frequency:     Barriers to D/C:            Co-evaluation              End of Session    Activity Tolerance: Patient tolerated treatment well Patient left: in chair;with call bell/phone within reach;with family/visitor present   Time: 3009-2330 OT Time Calculation (min): 20 min Charges:  OT General Charges $OT Visit: 1 Procedure OT Evaluation $OT Eval Low Complexity: 1 Procedure G-Codes:    Connie Lang 2016-05-21, 9:38 AM Connie Lang,  OTR/L [phone] May 21, 2016

## 2016-05-14 NOTE — Discharge Summary (Signed)
Physician Discharge Summary  Patient ID: Connie Lang MRN: [id number] DOB/AGE: 08-10-1940 75 y.o.  Admit date: 05/13/2016 Discharge date: 05/14/2016  Admission Diagnoses:  Primary osteoarthritis of right hip  Discharge Diagnoses:  Principal Problem:   Primary osteoarthritis of right hip   Past Medical History:  Diagnosis Date  . Allergy    occasionally has hayfever  . Anxiety   . Breast cancer ([place]) 2017  . Cancer ([place]) 1987   cancerous cells in vaginal wall seemed to be metastatic from the gut but no primary was ever found   . Chicken pox   . Constipation   . Depression   . DJD (degenerative joint disease)   . GERD (gastroesophageal reflux disease)   . Glaucoma    glaucoma and cataracts sees Dr Satira Sark   . Hyperlipemia   . Hypertension    pt denies currently. " Prednisone made it high."  . Lumbar disc disease    sees Dr. Sherwood Gambler   . Osteoarthritis   . Osteopenia   . PMR (polymyalgia rheumatica) ([facility])    sees Dr. Leigh Aurora   . Radiation    02-25-16 last radiation for breast cancer  . Transfusion history    blood     Surgeries: Procedure(s): RIGHT TOTAL HIP ARTHROPLASTY ANTERIOR APPROACH on 05/13/2016   Consultants (if any):   Discharged Condition: Improved  Hospital Course: Connie Lang is an 75 y.o. female who was admitted 05/13/2016 with a diagnosis of Primary osteoarthritis of right hip and went to the operating room on 05/13/2016 and underwent the above named procedures.    She was given perioperative antibiotics:  Anti-infectives    Start     Dose/Rate Route Frequency Ordered Stop   05/13/16 2100  ceFAZolin (ANCEF) IVPB 2g/100 mL premix     2 g 200 mL/hr over 30 Minutes Intravenous Every 6 hours 05/13/16 1941 05/14/16 0335   05/13/16 1046  ceFAZolin (ANCEF) IVPB 2g/100 mL premix     2 g 200 mL/hr over 30 Minutes Intravenous On call to O.R. 05/13/16 1046 05/13/16 1319    .  She was given sequential compression devices, early  ambulation, and ASA for DVT prophylaxis.  She benefited maximally from the hospital stay and there were no complications.    Recent vital signs:  Vitals:   05/14/16 0919 05/14/16 1422  BP: 114/64 (!) 105/57  Pulse: (!) 105 (!) 106  Resp: 16 17  Temp: 99.4 F (37.4 C) 98.9 F (37.2 C)    Recent laboratory studies:  Lab Results  Component Value Date   HGB 10.5 (L) 05/14/2016   HGB 14.0 05/07/2016   HGB 13.8 12/10/2015   Lab Results  Component Value Date   WBC 10.8 (H) 05/14/2016   PLT 167 05/14/2016   No results found for: INR Lab Results  Component Value Date   NA 138 05/14/2016   K 4.7 05/14/2016   CL 106 05/14/2016   CO2 24 05/14/2016   BUN 12 05/14/2016   CREATININE 0.72 05/14/2016   GLUCOSE 198 (H) 05/14/2016    Discharge Medications:     Medication List    STOP taking these medications   acetaminophen 500 MG tablet Commonly known as:  TYLENOL   ADVIL 200 MG Caps Generic drug:  Ibuprofen   anti-nausea solution     TAKE these medications   amLODipine 5 MG tablet Commonly known as:  NORVASC Take 5 mg by mouth every evening.   aspirin 81 MG chewable tablet Chew 1  tablet (81 mg total) by mouth 2 (two) times daily.   CALCIUM 600+D3 600-800 MG-UNIT Tabs Generic drug:  Calcium Carb-Cholecalciferol Take 2 tablets by mouth daily at 2 PM.   chlorpheniramine 4 MG tablet Commonly known as:  CHLOR-TRIMETON Take 4 mg by mouth at bedtime. Chlortabs   cyclobenzaprine 10 MG tablet Commonly known as:  FLEXERIL TAKE ONE TABLET BY MOUTH THREE TIMES DAILY AS NEEDED FOR MUSCLE SPASMS   diphenhydrAMINE 12.5 MG chewable tablet Commonly known as:  BENADRYL Chew 12.5 mg by mouth 2 (two) times daily as needed (for allergic reaction (lip swelling)).   diphenhydrAMINE-zinc acetate cream Commonly known as:  BENADRYL Apply 1 application topically 2 (two) times daily as needed for itching.   docusate sodium 100 MG capsule Commonly known as:  COLACE Take 1 capsule  (100 mg total) by mouth 2 (two) times daily.   furosemide 40 MG tablet Commonly known as:  LASIX Take 1 tablet (40 mg total) by mouth 2 (two) times daily.   HYDROcodone-acetaminophen 10-325 MG tablet Commonly known as:  NORCO Take 1-2 tablets by mouth every 4 (four) hours as needed (breakthrough pain). What changed:  how much to take  reasons to take this   linaclotide 72 MCG capsule Commonly known as:  LINZESS Take 1 capsule (72 mcg total) by mouth daily before breakfast.   lisinopril 10 MG tablet Commonly known as:  PRINIVIL,ZESTRIL Take 10 mg by mouth daily.   magnesium hydroxide 400 MG/5ML suspension Commonly known as:  MILK OF MAGNESIA Take 15-60 mLs by mouth daily as needed (for constipation).   methocarbamol 500 MG tablet Commonly known as:  ROBAXIN Take 1 tablet (500 mg total) by mouth every 6 (six) hours as needed for muscle spasms.   MUCUS RELIEF DM COUGH 20-400 MG Tabs Generic drug:  Dextromethorphan-Guaifenesin Take 1-2 tablets by mouth 2 (two) times daily as needed (for cough/congestion).   multivitamin with minerals Tabs tablet Take 1 tablet by mouth daily. One-A-Day 65+   Omega 3 1200 MG Caps Take 1,200 mg by mouth daily at 2 PM.   ondansetron 4 MG tablet Commonly known as:  ZOFRAN Take 1 tablet (4 mg total) by mouth every 6 (six) hours as needed for nausea.   oxymetazoline 0.05 % nasal spray Commonly known as:  AFRIN Place 1 spray into both nostrils 2 (two) times daily as needed for congestion.   pantoprazole 40 MG tablet Commonly known as:  PROTONIX Take 1 tablet (40 mg total) by mouth daily.   PHAZYME 125 MG chewable tablet Generic drug:  simethicone Chew 125-250 mg by mouth 2 (two) times daily as needed (for gas/acid reflux.).   potassium chloride 10 MEQ tablet Commonly known as:  KLOR-CON 10 Take 2 tablets by mouth daily What changed:  how much to take  how to take this  when to take this  additional instructions   PRESERVISION  AREDS 2 PO Take 1 tablet by mouth 2 (two) times daily.   senna 8.6 MG Tabs tablet Commonly known as:  SENOKOT Take 2 tablets (17.2 mg total) by mouth at bedtime.   simvastatin 20 MG tablet Commonly known as:  ZOCOR Take 1 tablet (20 mg total) by mouth at bedtime.   SYSTANE BALANCE 0.6 % Soln Generic drug:  Propylene Glycol Place 1-2 drops into both eyes 4 (four) times daily as needed (for dry eyes).   timolol 0.5 % ophthalmic gel-forming Commonly known as:  TIMOPTIC-XR Place 1 drop into both eyes daily.  TRAVATAN Z 0.004 % Soln ophthalmic solution Generic drug:  Travoprost (BAK Free) Place 1 drop into both eyes at bedtime.   vitamin B-12 1000 MCG tablet Commonly known as:  CYANOCOBALAMIN Take 1,000 mcg by mouth daily at 2 PM.            Durable Medical Equipment        Start     Ordered   05/13/16 1942  DME 3 n 1  Once     05/13/16 1941   05/13/16 1942  DME Bedside commode  Once     05/13/16 1941   05/13/16 1942  DME Walker rolling  Once     05/13/16 1941      Diagnostic Studies: Dg Pelvis Portable  Result Date: 05/13/2016 CLINICAL DATA:  Postop right hip surgery. EXAM: PORTABLE PELVIS 1-2 VIEWS COMPARISON:  08/01/2014 FINDINGS: Patient has undergone right total hip arthroplasty. The femoral head component appears well seated in the acetabular component on this frontal view. There is lucency in the right ischium just below the acetabulum, suspicious for acute fracture. IMPRESSION: Status post right total hip arthroplasty. Suspect right ischial fracture. The salient findings were discussed with Rod Can on 05/13/2016 at 4:17 pm. Electronically Signed   By: Nolon Nations M.D.   On: 05/13/2016 16:17   Dg C-arm 61-120 Min-no Report  Result Date: 05/13/2016 CLINICAL DATA:  Total hip replaced EXAM: OPERATIVE right HIP (WITH PELVIS IF PERFORMED) 1 VIEWS TECHNIQUE: Fluoroscopic spot image(s) were submitted for interpretation post-operatively. COMPARISON:  None.  FINDINGS: Single low resolution spot intraoperative image of the right hip. Patient is status post right hip replacement with satisfactory alignment. Total fluoroscopy time was 25 seconds. IMPRESSION: Intraoperative fluoroscopic assistance provided during right hip replacement Electronically Signed   By: Donavan Foil M.D.   On: 05/13/2016 15:14   Dg Hip Operative Unilat With Pelvis Right  Result Date: 05/13/2016 CLINICAL DATA:  Total hip replaced EXAM: OPERATIVE right HIP (WITH PELVIS IF PERFORMED) 1 VIEWS TECHNIQUE: Fluoroscopic spot image(s) were submitted for interpretation post-operatively. COMPARISON:  None. FINDINGS: Single low resolution spot intraoperative image of the right hip. Patient is status post right hip replacement with satisfactory alignment. Total fluoroscopy time was 25 seconds. IMPRESSION: Intraoperative fluoroscopic assistance provided during right hip replacement Electronically Signed   By: Donavan Foil M.D.   On: 05/13/2016 15:14    Disposition: 01-Home or Self Care  Discharge Instructions    Call MD / Call 911    Complete by:  As directed    If you experience chest pain or shortness of breath, CALL 911 and be transported to the hospital emergency room.  If you develope a fever above 101 F, pus (white drainage) or increased drainage or redness at the wound, or calf pain, call your surgeon's office.   Constipation Prevention    Complete by:  As directed    Drink plenty of fluids.  Prune juice may be helpful.  You may use a stool softener, such as Colace (over the counter) 100 mg twice a day.  Use MiraLax (over the counter) for constipation as needed.   Diet - low sodium heart healthy    Complete by:  As directed    Driving restrictions    Complete by:  As directed    No driving for 6 weeks   Increase activity slowly as tolerated    Complete by:  As directed    Lifting restrictions    Complete by:  As directed  No lifting for 6 weeks   TED hose    Complete by:  As  directed    Use stockings (TED hose) for 2 weeks on both leg(s).  You may remove them at night for sleeping.      Follow-up Information    Robie Oats, Horald Pollen, MD. Schedule an appointment as soon as possible for a visit in 2 weeks.   Specialty:  Orthopedic Surgery Why:  For wound re-check Contact information: [address] [place] [address] [phone]            Signed: Elie Goody 05/14/2016, 4:41 PM

## 2016-05-14 NOTE — Progress Notes (Signed)
Discharge planning, spoke with patient and spouse at bedside. Have chosen [facility] for [facility]. Contacted [facility] for referral. Needs 3n1, has RW, contacted [facility] to deliver RW  to room. [phone]

## 2016-05-17 ENCOUNTER — Encounter (HOSPITAL_COMMUNITY): Payer: Self-pay | Admitting: Orthopedic Surgery

## 2016-05-17 ENCOUNTER — Encounter: Payer: Medicare Other | Admitting: Adult Health

## 2016-05-17 DIAGNOSIS — M1990 Unspecified osteoarthritis, unspecified site: Secondary | ICD-10-CM | POA: Diagnosis not present

## 2016-05-17 DIAGNOSIS — Z471 Aftercare following joint replacement surgery: Secondary | ICD-10-CM | POA: Diagnosis not present

## 2016-05-17 DIAGNOSIS — I10 Essential (primary) hypertension: Secondary | ICD-10-CM | POA: Diagnosis not present

## 2016-05-17 DIAGNOSIS — M353 Polymyalgia rheumatica: Secondary | ICD-10-CM | POA: Diagnosis not present

## 2016-05-17 DIAGNOSIS — M5136 Other intervertebral disc degeneration, lumbar region: Secondary | ICD-10-CM | POA: Diagnosis not present

## 2016-05-17 DIAGNOSIS — F419 Anxiety disorder, unspecified: Secondary | ICD-10-CM | POA: Diagnosis not present

## 2016-06-01 DIAGNOSIS — Z96641 Presence of right artificial hip joint: Secondary | ICD-10-CM | POA: Diagnosis not present

## 2016-06-17 ENCOUNTER — Other Ambulatory Visit: Payer: Self-pay | Admitting: Family Medicine

## 2016-06-17 NOTE — Telephone Encounter (Signed)
Pt request refill  °HYDROcodone-acetaminophen (NORCO) 10-325 MG tablet °

## 2016-06-17 NOTE — Telephone Encounter (Signed)
This needs to come from her orthopedic doctor, Dr. Delfino Lovett

## 2016-06-22 NOTE — Telephone Encounter (Signed)
Pts husband calling in to check the status of the Rx.  I made pts husband aware of the below msg.

## 2016-06-29 DIAGNOSIS — Z96641 Presence of right artificial hip joint: Secondary | ICD-10-CM | POA: Diagnosis not present

## 2016-07-09 DIAGNOSIS — Z85828 Personal history of other malignant neoplasm of skin: Secondary | ICD-10-CM | POA: Diagnosis not present

## 2016-07-09 DIAGNOSIS — L218 Other seborrheic dermatitis: Secondary | ICD-10-CM | POA: Diagnosis not present

## 2016-07-23 ENCOUNTER — Encounter: Payer: Medicare Other | Admitting: Adult Health

## 2016-08-04 ENCOUNTER — Other Ambulatory Visit: Payer: Self-pay | Admitting: Family Medicine

## 2016-08-19 ENCOUNTER — Encounter: Payer: Self-pay | Admitting: Family Medicine

## 2016-08-20 NOTE — Telephone Encounter (Signed)
I suggest an antihistamine OTC. But some people get better results with some than others. Try Zyrtec or Claritin or Allegra

## 2016-08-24 DIAGNOSIS — Z471 Aftercare following joint replacement surgery: Secondary | ICD-10-CM | POA: Diagnosis not present

## 2016-08-24 DIAGNOSIS — M1612 Unilateral primary osteoarthritis, left hip: Secondary | ICD-10-CM | POA: Diagnosis not present

## 2016-08-24 DIAGNOSIS — Z96641 Presence of right artificial hip joint: Secondary | ICD-10-CM | POA: Diagnosis not present

## 2016-08-26 DIAGNOSIS — M1612 Unilateral primary osteoarthritis, left hip: Secondary | ICD-10-CM | POA: Diagnosis not present

## 2016-09-07 ENCOUNTER — Other Ambulatory Visit: Payer: Self-pay | Admitting: Family Medicine

## 2016-09-10 ENCOUNTER — Other Ambulatory Visit: Payer: Self-pay | Admitting: Family Medicine

## 2016-09-14 NOTE — Telephone Encounter (Signed)
Can we refill this? 

## 2016-09-17 DIAGNOSIS — M1612 Unilateral primary osteoarthritis, left hip: Secondary | ICD-10-CM | POA: Diagnosis not present

## 2016-09-24 ENCOUNTER — Ambulatory Visit: Payer: Self-pay | Admitting: Orthopedic Surgery

## 2016-09-27 ENCOUNTER — Telehealth: Payer: Self-pay | Admitting: Family Medicine

## 2016-09-27 NOTE — Telephone Encounter (Signed)
Can you please call pt to schedule a 30 minute surgery clearance appointment? We have for to sign at my desk. She will need to be seen before Dr. Sarajane Jews will sign.

## 2016-09-27 NOTE — Telephone Encounter (Signed)
Pt is on the schedule.

## 2016-09-27 NOTE — Telephone Encounter (Signed)
Pt is scheduled 10/11/16 for a CPE

## 2016-10-07 ENCOUNTER — Encounter: Payer: Medicare Other | Admitting: Family Medicine

## 2016-10-11 ENCOUNTER — Ambulatory Visit (INDEPENDENT_AMBULATORY_CARE_PROVIDER_SITE_OTHER): Payer: Medicare Other | Admitting: Family Medicine

## 2016-10-11 ENCOUNTER — Encounter: Payer: Self-pay | Admitting: Family Medicine

## 2016-10-11 VITALS — BP 138/85 | HR 50 | Temp 98.1°F | Ht 63.5 in | Wt 188.0 lb

## 2016-10-11 DIAGNOSIS — M353 Polymyalgia rheumatica: Secondary | ICD-10-CM

## 2016-10-11 DIAGNOSIS — R002 Palpitations: Secondary | ICD-10-CM

## 2016-10-11 DIAGNOSIS — I4510 Unspecified right bundle-branch block: Secondary | ICD-10-CM

## 2016-10-11 DIAGNOSIS — M150 Primary generalized (osteo)arthritis: Secondary | ICD-10-CM

## 2016-10-11 DIAGNOSIS — E782 Mixed hyperlipidemia: Secondary | ICD-10-CM | POA: Diagnosis not present

## 2016-10-11 DIAGNOSIS — R9431 Abnormal electrocardiogram [ECG] [EKG]: Secondary | ICD-10-CM | POA: Diagnosis not present

## 2016-10-11 DIAGNOSIS — I10 Essential (primary) hypertension: Secondary | ICD-10-CM

## 2016-10-11 DIAGNOSIS — M159 Polyosteoarthritis, unspecified: Secondary | ICD-10-CM

## 2016-10-11 LAB — BASIC METABOLIC PANEL WITH GFR
BUN: 25 mg/dL — ABNORMAL HIGH (ref 6–23)
CO2: 32 meq/L (ref 19–32)
Calcium: 10.5 mg/dL (ref 8.4–10.5)
Chloride: 97 meq/L (ref 96–112)
Creatinine, Ser: 1.08 mg/dL (ref 0.40–1.20)
GFR: 52.3 mL/min — ABNORMAL LOW
Glucose, Bld: 115 mg/dL — ABNORMAL HIGH (ref 70–99)
Potassium: 4.5 meq/L (ref 3.5–5.1)
Sodium: 140 meq/L (ref 135–145)

## 2016-10-11 LAB — POC URINALSYSI DIPSTICK (AUTOMATED)
Bilirubin, UA: NEGATIVE
Blood, UA: NEGATIVE
Glucose, UA: NEGATIVE
Ketones, UA: NEGATIVE
Leukocytes, UA: NEGATIVE
Nitrite, UA: NEGATIVE
Protein, UA: NEGATIVE
Spec Grav, UA: 1.01
Urobilinogen, UA: 0.2
pH, UA: 6

## 2016-10-11 LAB — CBC WITH DIFFERENTIAL/PLATELET
Basophils Absolute: 0.1 10*3/uL (ref 0.0–0.1)
Basophils Relative: 1 % (ref 0.0–3.0)
Eosinophils Absolute: 0.7 10*3/uL (ref 0.0–0.7)
Eosinophils Relative: 6.2 % — ABNORMAL HIGH (ref 0.0–5.0)
HCT: 42.3 % (ref 36.0–46.0)
Hemoglobin: 14.2 g/dL (ref 12.0–15.0)
Lymphocytes Relative: 18.7 % (ref 12.0–46.0)
Lymphs Abs: 2.1 10*3/uL (ref 0.7–4.0)
MCHC: 33.5 g/dL (ref 30.0–36.0)
MCV: 96.2 fl (ref 78.0–100.0)
Monocytes Absolute: 1.1 10*3/uL — ABNORMAL HIGH (ref 0.1–1.0)
Monocytes Relative: 10 % (ref 3.0–12.0)
Neutro Abs: 7.3 10*3/uL (ref 1.4–7.7)
Neutrophils Relative %: 64.1 % (ref 43.0–77.0)
Platelets: 243 10*3/uL (ref 150.0–400.0)
RBC: 4.39 Mil/uL (ref 3.87–5.11)
RDW: 14 % (ref 11.5–15.5)
WBC: 11.3 10*3/uL — ABNORMAL HIGH (ref 4.0–10.5)

## 2016-10-11 LAB — LIPID PANEL
Cholesterol: 214 mg/dL — ABNORMAL HIGH (ref 0–200)
HDL: 60.9 mg/dL
NonHDL: 153.51
Total CHOL/HDL Ratio: 4
Triglycerides: 276 mg/dL — ABNORMAL HIGH (ref 0.0–149.0)
VLDL: 55.2 mg/dL — ABNORMAL HIGH (ref 0.0–40.0)

## 2016-10-11 LAB — LDL CHOLESTEROL, DIRECT: LDL DIRECT: 114 mg/dL

## 2016-10-11 LAB — HEPATIC FUNCTION PANEL
ALBUMIN: 4.7 g/dL (ref 3.5–5.2)
ALT: 32 U/L (ref 0–35)
AST: 28 U/L (ref 0–37)
Alkaline Phosphatase: 138 U/L — ABNORMAL HIGH (ref 39–117)
Bilirubin, Direct: 0.1 mg/dL (ref 0.0–0.3)
TOTAL PROTEIN: 7.3 g/dL (ref 6.0–8.3)
Total Bilirubin: 0.6 mg/dL (ref 0.2–1.2)

## 2016-10-11 LAB — TSH: TSH: 0.67 u[IU]/mL (ref 0.35–4.50)

## 2016-10-11 MED ORDER — POTASSIUM CHLORIDE ER 10 MEQ PO TBCR: EXTENDED_RELEASE_TABLET | ORAL | 3 refills | Status: DC

## 2016-10-11 MED ORDER — PANTOPRAZOLE SODIUM 40 MG PO TBEC: 40.0000 mg | DELAYED_RELEASE_TABLET | Freq: Every day | ORAL | 3 refills | Status: DC

## 2016-10-11 MED ORDER — LOSARTAN POTASSIUM 50 MG PO TABS: 50.0000 mg | ORAL_TABLET | Freq: Every day | ORAL | 3 refills | Status: DC

## 2016-10-11 NOTE — Progress Notes (Signed)
   Subjective:    Patient ID: Connie Lang, female    DOB: 1940/10/10, 76 y.o.   MRN: [id number]  HPI 76 yr old female for a clearance exam prior to hip surgery. She is scheduled for a left hip total arthroplasty per Dr. Lyla Glassing on May 10. She had the other hip replaced last year and did well. Her main complaint today is her hip pain. No chest pains or SOB. She also mentions a dry tickling in her throat that makes her cough for the past month.    Review of Systems  Constitutional: Negative.   HENT: Negative.   Respiratory: Positive for cough. Negative for apnea, choking, chest tightness, shortness of breath, wheezing and stridor.   Cardiovascular: Negative.   Gastrointestinal: Negative.   Genitourinary: Negative.   Musculoskeletal: Positive for arthralgias and gait problem.  Neurological: Negative for dizziness, tremors, seizures, syncope, facial asymmetry, speech difficulty, light-headedness and headaches.       Objective:   Physical Exam  Constitutional: She is oriented to person, place, and time. She appears well-developed and well-nourished. No distress.  HENT:  Head: Normocephalic and atraumatic.  Right Ear: External ear normal.  Left Ear: External ear normal.  Nose: Nose normal.  Mouth/Throat: Oropharynx is clear and moist. No oropharyngeal exudate.  Eyes: Conjunctivae and EOM are normal. Pupils are equal, round, and reactive to light. No scleral icterus.  Neck: Normal range of motion. Neck supple. No JVD present. No thyromegaly present.  Cardiovascular: Normal rate, normal heart sounds and intact distal pulses.  Exam reveals no gallop and no friction rub.   No murmur heard. Irregular rhythm. EKG shows significant change from a year ago. She has bigeminy, RBBB, and a possible old anterior infarct.   Pulmonary/Chest: Effort normal and breath sounds normal. No respiratory distress. She has no wheezes. She has no rales. She exhibits no tenderness.  Abdominal: Soft. Bowel  sounds are normal. She exhibits no distension and no mass. There is no tenderness. There is no rebound and no guarding.  Musculoskeletal: Normal range of motion. She exhibits no edema or tenderness.  Lymphadenopathy:    She has no cervical adenopathy.  Neurological: She is alert and oriented to person, place, and time. She has normal reflexes. No cranial nerve deficit. She exhibits normal muscle tone. Coordination normal.  Skin: Skin is warm and dry. No rash noted. No erythema.  Psychiatric: She has a normal mood and affect. Her behavior is normal. Judgment and thought content normal.          Assessment & Plan:  Her HTN is well controlled but she has a cough that may be from her Lisinopril. We will stop this and switch to Losartan 50 mg daily. She has a very abnormal EKG so we will NOT clear her for hip surgery until she is cleared by Cardiology. We will arrange for her to see Dr. Marlou Porch again asap. Get fasting labs today.  Alysia Penna, MD

## 2016-10-11 NOTE — Progress Notes (Signed)
Pre visit review using our clinic review tool, if applicable. No additional management support is needed unless otherwise documented below in the visit note. 

## 2016-10-11 NOTE — Patient Instructions (Signed)
WE NOW OFFER   [facility] FAST TRACK!!!  SAME DAY Appointments for ACUTE CARE  Such as: Sprains, Injuries, cuts, abrasions, rashes, muscle pain, joint pain, back pain Colds, flu, sore throats, headache, allergies, cough, fever  Ear pain, sinus and eye infections Abdominal pain, nausea, vomiting, diarrhea, upset stomach Animal/insect bites  3 Easy Ways to Schedule: Walk-In Scheduling Call in scheduling Mychart Sign-up: [email or web address]         

## 2016-10-15 ENCOUNTER — Other Ambulatory Visit: Payer: Self-pay | Admitting: Orthopedic Surgery

## 2016-10-20 ENCOUNTER — Other Ambulatory Visit: Payer: Self-pay | Admitting: Orthopedic Surgery

## 2016-10-29 ENCOUNTER — Encounter: Payer: Self-pay | Admitting: Cardiology

## 2016-10-29 ENCOUNTER — Ambulatory Visit (INDEPENDENT_AMBULATORY_CARE_PROVIDER_SITE_OTHER): Payer: Medicare Other | Admitting: Cardiology

## 2016-10-29 VITALS — BP 142/60 | HR 48 | Ht 63.0 in | Wt 189.0 lb

## 2016-10-29 DIAGNOSIS — Z01810 Encounter for preprocedural cardiovascular examination: Secondary | ICD-10-CM | POA: Diagnosis not present

## 2016-10-29 DIAGNOSIS — M25559 Pain in unspecified hip: Secondary | ICD-10-CM

## 2016-10-29 DIAGNOSIS — I447 Left bundle-branch block, unspecified: Secondary | ICD-10-CM

## 2016-10-29 DIAGNOSIS — I441 Atrioventricular block, second degree: Secondary | ICD-10-CM | POA: Diagnosis not present

## 2016-10-29 NOTE — Patient Instructions (Signed)
Stop Timolol drops   Your physician wants you to follow-up in: 6 months with Dr.Skains. You will receive a reminder letter in the mail two months in advance. If you don't receive a letter, please call our office to schedule the follow-up appointment.

## 2016-10-29 NOTE — Progress Notes (Signed)
Cardiology Office Note:    Date:  10/30/2016   ID:  Connie Lang, DOB 01/12/1941, MRN [id number]  PCP:  Alysia Penna, MD  Cardiologist:  Candee Furbish, MD  Referring MD: Laurey Morale, MD     History of Present Illness:    Connie Lang is a 76 y.o. female here for preoperative risk stratification at the request of Dr. Alysia Penna.  She is going to have a total left hip arthroplasty by Dr. Rod Can in May. She recently had her right hip arthroplasty done in November 2017 without any difficulty.   She has a second-degree heart block type I on EKG from 10/11/16 with left bundle branch block/interventricular conduction delay.  Prior EKG on 10/02/15 shows first-degree AV block with narrow QRS complex.  Prior stress test on 06/12/15 did not show any ischemia.  Normal ejection fraction previously.  Prior radiation to right breast (cancer)  Denies CP, Syncope, dizziness, orthopnea. Able to walk with cane quite well, greater than 4 METS without angina.   Past Medical History:  Diagnosis Date  . Allergy    occasionally has hayfever  . Anxiety   . Breast cancer ([facility]) 2017  . Cancer ([place]) 1987   cancerous cells in vaginal wall seemed to be metastatic from the gut but no primary was ever found   . Chicken pox   . Constipation   . Depression   . DJD (degenerative joint disease)   . GERD (gastroesophageal reflux disease)   . Glaucoma    glaucoma and cataracts sees Dr Satira Sark   . Hyperlipemia   . Hypertension    pt denies currently. " Prednisone made it high."  . Lumbar disc disease    sees Dr. Sherwood Gambler   . Osteoarthritis   . Osteopenia   . PMR (polymyalgia rheumatica) (HCC)    sees Dr. Leigh Aurora   . Radiation    02-25-16 last radiation for breast cancer  . Transfusion history    blood     Past Surgical History:  Procedure Laterality Date  . ABDOMINAL HYSTERECTOMY     TAH BSO 1985  . APPENDECTOMY    . BASAL CELL CARCINOMA EXCISION     nose and rt  thigh  . BREAST BIOPSY      x 4  . BREAST LUMPECTOMY WITH RADIOACTIVE SEED LOCALIZATION Right 12/25/2015   Procedure: RIGHT BREAST LUMPECTOMY WITH RADIOACTIVE SEED LOCALIZATION;  Surgeon: Rolm Bookbinder, MD;  Location: [facility];  Service: General;  Laterality: Right;  . CATARACT EXTRACTION     01-27-2011 left, 02-17-2011 right   . CERVICAL LAMINECTOMY     1983  . cervical microdiskectomy     1990 Dr Jovita Gamma  . CESAREAN SECTION    . COLONOSCOPY  03/19/2016   per Dr. Silverio Decamp, adenomatous polyps, repeat in one year   . EYE SURGERY     bil cataracts with lens implant  . FOOT ARTHRODESIS, MODIFIED MCBRIDE    . LUMBAR LAMINECTOMY     1990 Dr Jovita Gamma  . MYELOGRAM    . POLYPECTOMY    . REFRACTIVE SURGERY     01-29-2009 04-07-2009 Dr Foye Clock in [place]  . spinal injection     07-30-2012, 09-24-2012  . TENDON REPAIR     rt wrist  . TOTAL HIP ARTHROPLASTY Right 05/13/2016   Procedure: RIGHT TOTAL HIP ARTHROPLASTY ANTERIOR APPROACH;  Surgeon: Rod Can, MD;  Location: [facility];  Service: Orthopedics;  Laterality: Right;  [place] RNFA  . VAGINAL BIRTH AFTER CESAREAN SECTION     x3    Current Medications: Current Meds  Medication Sig  . amLODipine (NORVASC) 5 MG tablet Take 5 mg by mouth every evening.   Marland Kitchen aspirin 81 MG chewable tablet Chew 1 tablet (81 mg total) by mouth 2 (two) times daily. (Patient taking differently: Chew 81 mg by mouth daily. )  . cyclobenzaprine (FLEXERIL) 10 MG tablet TAKE ONE TABLET BY MOUTH THREE TIMES DAILY AS NEEDED FOR MUSCLE SPASM  . Dextromethorphan-Guaifenesin (MUCUS RELIEF DM COUGH) 20-400 MG TABS Take 1-2 tablets by mouth 2 (two) times daily as needed (for cough/congestion).  . diphenhydrAMINE (BENADRYL) 12.5 MG chewable tablet Chew 12.5 mg by mouth 2 (two) times daily as needed (for allergic reaction (lip swelling)).  . diphenhydrAMINE-zinc acetate (BENADRYL) cream Apply 1 application topically 2 (two) times daily as  needed for itching.   . furosemide (LASIX) 40 MG tablet TAKE ONE TABLET BY MOUTH TWICE DAILY  . linaclotide (LINZESS) 72 MCG capsule Take 1 capsule (72 mcg total) by mouth daily before breakfast.  . losartan (COZAAR) 50 MG tablet Take 1 tablet (50 mg total) by mouth daily.  . magnesium hydroxide (MILK OF MAGNESIA) 400 MG/5ML suspension Take 15-60 mLs by mouth daily as needed (for constipation).   . Multiple Vitamin (MULTIVITAMIN WITH MINERALS) TABS tablet Take 1 tablet by mouth daily. One-A-Day 65+  . Multiple Vitamins-Minerals (PRESERVISION AREDS 2 PO) Take 1 tablet by mouth 2 (two) times daily.  . Omega 3 1200 MG CAPS Take 1,200 mg by mouth daily at 2 PM.  . oxymetazoline (AFRIN) 0.05 % nasal spray Place 1 spray into both nostrils 2 (two) times daily as needed for congestion.  . pantoprazole (PROTONIX) 40 MG tablet Take 1 tablet (40 mg total) by mouth daily.  . potassium chloride (KLOR-CON 10) 10 MEQ tablet Take 2 tablets by mouth daily  . Propylene Glycol (SYSTANE BALANCE) 0.6 % SOLN Place 1-2 drops into both eyes 4 (four) times daily as needed (for dry eyes).  . simethicone (PHAZYME) 125 MG chewable tablet Chew 125-250 mg by mouth 2 (two) times daily as needed (for gas/acid reflux.).  Marland Kitchen simvastatin (ZOCOR) 20 MG tablet TAKE ONE TABLET BY MOUTH AT BEDTIME  . TRAVATAN Z 0.004 % SOLN ophthalmic solution Place 1 drop into both eyes at bedtime.   . [DISCONTINUED] timolol (TIMOPTIC-XR) 0.5 % ophthalmic gel-forming Place 1 drop into both eyes daily.      Allergies:   Patient has no known allergies.   Social History   Social History  . Marital status: Married    Spouse name: N/A  . Number of children: N/A  . Years of education: N/A   Social History Main Topics  . Smoking status: Former Smoker    Packs/day: 0.50    Years: 36.00    Types: Cigarettes    Quit date: 07/06/1995  . Smokeless tobacco: Current User    Types: Snuff, Chew  . Alcohol use No     Comment: no wine at present   .  Drug use: No  . Sexual activity: Not Asked   Other Topics Concern  . None   Social History Narrative  . None     family history includes Alzheimer's disease in her father, paternal aunt, and paternal aunt; Breast cancer in her paternal aunt; Cancer in her sister; Cervical cancer (age of onset: 2) in her cousin; Colon cancer in her brother; Diabetes in her maternal grandfather; Heart  attack in her paternal grandfather; Heart attack (age of onset: 69) in her paternal uncle; Heart disease in her maternal grandfather; Heart failure in her father; Leukemia (age of onset: 41) in her grandchild; Ovarian cancer in her sister; Skin cancer in her grandchild; Stroke in her maternal aunt; Stroke (age of onset: 19) in her paternal grandmother; Stroke (age of onset: 27) in her mother. There is no history of Colon polyps, Rectal cancer, or Stomach cancer. ROS:   Please see the history of present illness.     All other systems reviewed and are negative.   EKGs/Labs/Other Studies Reviewed:    EKG:  EKG is ordered today.  The ekg ordered today demonstrates Second-degree AV block type I. P to P interval is regular.  Recent Labs: 11/21/2015: Pro B Natriuretic peptide (BNP) 36.0 10/11/2016: ALT 32; BUN 25; Creatinine, Ser 1.08; Hemoglobin 14.2; Platelets 243.0; Potassium 4.5; Sodium 140; TSH 0.67   Recent Lipid Panel    Component Value Date/Time   CHOL 214 (H) 10/11/2016 1243   TRIG 276.0 (H) 10/11/2016 1243   HDL 60.90 10/11/2016 1243   CHOLHDL 4 10/11/2016 1243   VLDL 55.2 (H) 10/11/2016 1243   LDLCALC 136 (H) 10/02/2015 0950   LDLDIRECT 114.0 10/11/2016 1243    Physical Exam:    VS:  BP (!) 142/60   Pulse (!) 48   Ht '5\' 3"'$  (1.6 m)   Wt 189 lb (85.7 kg)   SpO2 98%   BMI 33.48 kg/m     Wt Readings from Last 3 Encounters:  10/29/16 189 lb (85.7 kg)  10/11/16 188 lb (85.3 kg)  05/13/16 195 lb (88.5 kg)     GEN: Well nourished, well developed in no acute distress HEENT: Normal NECK: No  JVD; No carotid bruits LYMPHATICS: No lymphadenopathy CARDIAC: group beating noted, normal S1,2, no murmurs, rubs, gallops RESPIRATORY:  Clear to auscultation without rales, wheezing or rhonchi  ABDOMEN: Soft, non-tender, non-distended MUSCULOSKELETAL:  No edema; No deformity  SKIN: Warm and dry NEUROLOGIC:  Alert and oriented x 3 PSYCHIATRIC:  Normal affect   ASSESSMENT:    1. Pre-operative cardiovascular examination   2. Second degree heart block   3. LBBB (left bundle branch block)    PLAN:    In order of problems listed above:  Secondary heart block type I, Wenckebach. Preoperative risk assessment.   - New intraventricular conduction delay as well.  - She has not had any high risk symptoms such as syncope. She has noted the irregular pulse warning on her blood pressure machine which is relatively new. Therefore this seems to be a new finding for her. She is not on any AV nodal blocking agent such as metoprolol. She does take tomorrow all eyedrops.  - She is able to complete greater than 4 METS of activity without difficulty and she has had a stress test and echocardiogram in the past as above.  - I feel comfortable allowing her to proceed with spinal or general anesthesia if necessary. Try to avoid any AV nodal blocking agents.  - We will stop her Timolol eyedrops.  - repeat EKG today and once again shows a second-degree heart block type I. The PR interval seems to slightly lengthen in the second beat. I reviewed with EP via EPIC.   - Of course given her conduction disease, new left bundle branch block, she is at risk for further deterioration of her conduction system and could need a pacemaker in the future. At this point she  is not having any high risk symptoms such as syncope or significant bradycardia. She understands this risk.   - We walked her around the clinic and were able to increase her heart rate to 92 bpm, resting of 57.  - We will see her back postoperatively and be  available during her perioperative course if necessary.  LBBB  - as above. Avoid Bb  Hip Pain  - awaiting replacement  40 min discussion  Medication Adjustments/Labs and Tests Ordered: Current medicines are reviewed at length with the patient today.  Concerns regarding medicines are outlined above. Labs and tests ordered and medication changes are outlined in the patient instructions below:  Patient Instructions  Stop Timolol drops   Your physician wants you to follow-up in: 6 months with Dr.Tiburcio Linder. You will receive a reminder letter in the mail two months in advance. If you don't receive a letter, please call our office to schedule the follow-up appointment.     Signed, Candee Furbish, MD  10/30/2016 9:55 AM    [facility]

## 2016-11-01 ENCOUNTER — Encounter: Payer: Self-pay | Admitting: Cardiology

## 2016-11-01 ENCOUNTER — Telehealth: Payer: Self-pay | Admitting: Cardiology

## 2016-11-01 NOTE — Telephone Encounter (Signed)
Paperwork for clearance was taken to MR to be faxed this AM.

## 2016-11-01 NOTE — Addendum Note (Signed)
Addended by: Campbell Riches on: 11/01/2016 10:28 AM   Modules accepted: Orders

## 2016-11-01 NOTE — Telephone Encounter (Signed)
Pt and husband aware paperwork was taken to MR to be faxed.

## 2016-11-01 NOTE — Telephone Encounter (Signed)
Follow Up:   Pt surgeon will be faxing a clearance for surgery. Pt would like to have her surgery on 11-11-16.She would like for you to send her clearance back asap,this will determine if she can have surgery on 11-11-16. Thank you so very much.

## 2016-11-03 ENCOUNTER — Ambulatory Visit: Payer: Self-pay | Admitting: Orthopedic Surgery

## 2016-11-03 ENCOUNTER — Other Ambulatory Visit: Payer: Self-pay | Admitting: Gastroenterology

## 2016-11-03 DIAGNOSIS — H401131 Primary open-angle glaucoma, bilateral, mild stage: Secondary | ICD-10-CM | POA: Diagnosis not present

## 2016-11-04 ENCOUNTER — Other Ambulatory Visit (HOSPITAL_COMMUNITY): Payer: Self-pay | Admitting: Emergency Medicine

## 2016-11-04 ENCOUNTER — Ambulatory Visit: Payer: Self-pay | Admitting: Orthopedic Surgery

## 2016-11-04 NOTE — Patient Instructions (Addendum)
Connie Lang  11/04/2016   Your procedure is scheduled on: 11-11-16  Report to [facility] Main  Entrance Take [place]  elevators to 3rd floor to  [place] at 1:45PM.    Call this number if you have problems the morning of surgery [phone]    Remember: ONLY 1 PERSON MAY GO WITH YOU TO SHORT STAY TO GET  READY MORNING OF YOUR SURGERY.  Do not eat food After Midnight. You may have clear liquids from midnight until 945am day of surgery. Nothing by mouth after 945am!!     Take these medicines the morning of surgery with A SIP OF WATER: pantoprazole(protonix), claritin, nasal spray and eye drops as needed                                You may not have any metal on your body including hair pins and              piercings  Do not wear jewelry, make-up, lotions, powders or perfumes, deodorant             Do not wear nail polish.  Do not shave  48 hours prior to surgery.               Do not bring valuables to the hospital. [place].  Contacts, dentures or bridgework may not be worn into surgery.  Leave suitcase in the car. After surgery it may be brought to your room.               Please read over the following fact sheets you were given: _____________________________________________________________________   CLEAR LIQUID DIET   Foods Allowed                                                                     Foods Excluded  Coffee and tea, regular and decaf                             liquids that you cannot  Plain Jell-O in any flavor                                             see through such as: Fruit ices (not with fruit pulp)                                     milk, soups, orange juice  Iced Popsicles                                    All solid food Carbonated beverages, regular and diet  Cranberry, grape and apple juices Sports drinks like  Gatorade Lightly seasoned clear broth or consume(fat free) Sugar, honey syrup  Sample Menu Breakfast                                Lunch                                     Supper Cranberry juice                    Beef broth                            Chicken broth Jell-O                                     Grape juice                           Apple juice Coffee or tea                        Jell-O                                      Popsicle                                                Coffee or tea                        Coffee or tea  _____________________________________________________________________   [facility] - Preparing for Surgery Before surgery, you can play an important role.  Because skin is not sterile, your skin needs to be as free of germs as possible.  You can reduce the number of germs on your skin by washing with CHG (chlorahexidine gluconate) soap before surgery.  CHG is an antiseptic cleaner which kills germs and bonds with the skin to continue killing germs even after washing. Please DO NOT use if you have an allergy to CHG or antibacterial soaps.  If your skin becomes reddened/irritated stop using the CHG and inform your nurse when you arrive at Short Stay. Do not shave (including legs and underarms) for at least 48 hours prior to the first CHG shower.  You may shave your face/neck. Please follow these instructions carefully:  1.  Shower with CHG Soap the night before surgery and the  morning of Surgery.  2.  If you choose to wash your hair, wash your hair first as usual with your  normal  shampoo.  3.  After you shampoo, rinse your hair and body thoroughly to remove the  shampoo.                           4.  Use CHG as you would any other liquid soap.  You can apply chg directly  to the skin and wash  Gently with a scrungie or clean washcloth.  5.  Apply the CHG Soap to your body ONLY FROM THE NECK DOWN.   Do not use on face/ open                            Wound or open sores. Avoid contact with eyes, ears mouth and genitals (private parts).                       Wash face,  Genitals (private parts) with your normal soap.             6.  Wash thoroughly, paying special attention to the area where your surgery  will be performed.  7.  Thoroughly rinse your body with warm water from the neck down.  8.  DO NOT shower/wash with your normal soap after using and rinsing off  the CHG Soap.                9.  Pat yourself dry with a clean towel.            10.  Wear clean pajamas.            11.  Place clean sheets on your bed the night of your first shower and do not  sleep with pets. Day of Surgery : Do not apply any lotions/deodorants the morning of surgery.  Please wear clean clothes to the hospital/surgery center.  FAILURE TO FOLLOW THESE INSTRUCTIONS MAY RESULT IN THE CANCELLATION OF YOUR SURGERY PATIENT SIGNATURE_________________________________  NURSE SIGNATURE__________________________________  ________________________________________________________________________   Connie Lang  An incentive spirometer is a tool that can help keep your lungs clear and active. This tool measures how well you are filling your lungs with each breath. Taking long deep breaths may help reverse or decrease the chance of developing breathing (pulmonary) problems (especially infection) following:  A long period of time when you are unable to move or be active. BEFORE THE PROCEDURE   If the spirometer includes an indicator to show your best effort, your nurse or respiratory therapist will set it to a desired goal.  If possible, sit up straight or lean slightly forward. Try not to slouch.  Hold the incentive spirometer in an upright position. INSTRUCTIONS FOR USE  1. Sit on the edge of your bed if possible, or sit up as far as you can in bed or on a chair. 2. Hold the incentive spirometer in an upright position. 3. Breathe out  normally. 4. Place the mouthpiece in your mouth and seal your lips tightly around it. 5. Breathe in slowly and as deeply as possible, raising the piston or the ball toward the top of the column. 6. Hold your breath for 3-5 seconds or for as long as possible. Allow the piston or ball to fall to the bottom of the column. 7. Remove the mouthpiece from your mouth and breathe out normally. 8. Rest for a few seconds and repeat Steps 1 through 7 at least 10 times every 1-2 hours when you are awake. Take your time and take a few normal breaths between deep breaths. 9. The spirometer may include an indicator to show your best effort. Use the indicator as a goal to work toward during each repetition. 10. After each set of 10 deep breaths, practice coughing to be sure your lungs are clear. If you have an incision (the cut made at the time of  surgery), support your incision when coughing by placing a pillow or rolled up towels firmly against it. Once you are able to get out of bed, walk around indoors and cough well. You may stop using the incentive spirometer when instructed by your caregiver.  RISKS AND COMPLICATIONS  Take your time so you do not get dizzy or light-headed.  If you are in pain, you may need to take or ask for pain medication before doing incentive spirometry. It is harder to take a deep breath if you are having pain. AFTER USE  Rest and breathe slowly and easily.  It can be helpful to keep track of a log of your progress. Your caregiver can provide you with a simple table to help with this. If you are using the spirometer at home, follow these instructions: [place] IF:   You are having difficultly using the spirometer.  You have trouble using the spirometer as often as instructed.  Your pain medication is not giving enough relief while using the spirometer.  You develop fever of 100.5 F (38.1 C) or higher. SEEK IMMEDIATE MEDICAL CARE IF:   You cough up bloody sputum  that had not been present before.  You develop fever of 102 F (38.9 C) or greater.  You develop worsening pain at or near the incision site. MAKE SURE YOU:   Understand these instructions.  Will watch your condition.  Will get help right away if you are not doing well or get worse. Document Released: 11/01/2006 Document Revised: 09/13/2011 Document Reviewed: 01/02/2007 ExitCare Patient Information 2014 Exit[employer], [place].   ________________________________________________________________________  WHAT IS A BLOOD TRANSFUSION? Blood Transfusion Information  A transfusion is the replacement of blood or some of its parts. Blood is made up of multiple cells which provide different functions.  Red blood cells carry oxygen and are used for blood loss replacement.  White blood cells fight against infection.  Platelets control bleeding.  Plasma helps clot blood.  Other blood products are available for specialized needs, such as hemophilia or other clotting disorders. BEFORE THE TRANSFUSION  Who gives blood for transfusions?   Healthy volunteers who are fully evaluated to make sure their blood is safe. This is blood bank blood. Transfusion therapy is the safest it has ever been in the practice of medicine. Before blood is taken from a donor, a complete history is taken to make sure that person has no history of diseases nor engages in risky social behavior (examples are intravenous drug use or sexual activity with multiple partners). The donor's travel history is screened to minimize risk of transmitting infections, such as malaria. The donated blood is tested for signs of infectious diseases, such as HIV and hepatitis. The blood is then tested to be sure it is compatible with you in order to minimize the chance of a transfusion reaction. If you or a relative donates blood, this is often done in anticipation of surgery and is not appropriate for emergency situations. It takes many days to  process the donated blood. RISKS AND COMPLICATIONS Although transfusion therapy is very safe and saves many lives, the main dangers of transfusion include:   Getting an infectious disease.  Developing a transfusion reaction. This is an allergic reaction to something in the blood you were given. Every precaution is taken to prevent this. The decision to have a blood transfusion has been considered carefully by your caregiver before blood is given. Blood is not given unless the benefits outweigh the risks. AFTER THE  TRANSFUSION  Right after receiving a blood transfusion, you will usually feel much better and more energetic. This is especially true if your red blood cells have gotten low (anemic). The transfusion raises the level of the red blood cells which carry oxygen, and this usually causes an energy increase.  The nurse administering the transfusion will monitor you carefully for complications. HOME CARE INSTRUCTIONS  No special instructions are needed after a transfusion. You may find your energy is better. Speak with your caregiver about any limitations on activity for underlying diseases you may have. SEEK MEDICAL CARE IF:   Your condition is not improving after your transfusion.  You develop redness or irritation at the intravenous (IV) site. SEEK IMMEDIATE MEDICAL CARE IF:  Any of the following symptoms occur over the next 12 hours:  Shaking chills.  You have a temperature by mouth above 102 F (38.9 C), not controlled by medicine.  Chest, back, or muscle pain.  People around you feel you are not acting correctly or are confused.  Shortness of breath or difficulty breathing.  Dizziness and fainting.  You get a rash or develop hives.  You have a decrease in urine output.  Your urine turns a dark color or changes to pink, red, or brown. Any of the following symptoms occur over the next 10 days:  You have a temperature by mouth above 102 F (38.9 C), not controlled by  medicine.  Shortness of breath.  Weakness after normal activity.  The white part of the eye turns yellow (jaundice).  You have a decrease in the amount of urine or are urinating less often.  Your urine turns a dark color or changes to pink, red, or brown. Document Released: 06/18/2000 Document Revised: 09/13/2011 Document Reviewed: 02/05/2008 [facility] Patient Information 2014 [place], [place].  _______________________________________________________________________

## 2016-11-04 NOTE — Progress Notes (Signed)
[facility] cardiology clearance Dr Marlou Porch 10-29-16 chart Medical clearance Dr Sarajane Jews 10-11-16 chart EKG 10-29-16 epic ; reviewed by [facility] Stress test 06-10-15 epic CBCdiff, BMP, Hep fx, UA  10-11-16 epic ECHO 2015 epic

## 2016-11-04 NOTE — H&P (Signed)
TOTAL HIP ADMISSION H&P  Patient is admitted for left total hip arthroplasty.  Subjective:  Chief Complaint: left hip pain  HPI: Connie Lang, 76 y.o. female, has a history of pain and functional disability in the left hip(s) due to arthritis and patient has failed non-surgical conservative treatments for greater than 12 weeks to include NSAID's and/or analgesics, corticosteriod injections, flexibility and strengthening excercises, use of assistive devices, weight reduction as appropriate and activity modification.  Onset of symptoms was gradual starting 1 years ago with rapidlly worsening course since that time.The patient noted no past surgery on the left hip(s).  Patient currently rates pain in the left hip at 10 out of 10 with activity. Patient has night pain, worsening of pain with activity and weight bearing, pain that interfers with activities of daily living, pain with passive range of motion and crepitus. Patient has evidence of subchondral sclerosis and joint space narrowing by imaging studies. This condition presents safety issues increasing the risk of falls. There is no current active infection.  Patient Active Problem List   Diagnosis Date Noted  . Primary osteoarthritis of right hip 05/13/2016  . Family history of ovarian cancer 01/27/2016  . Family history of colon cancer 01/27/2016  . Breast cancer of upper-outer quadrant of right female breast ([facility]) 12/03/2015  . Generalized edema 11/21/2015  . PMR (polymyalgia rheumatica) (HCC) 07/23/2015  . RBBB (right bundle branch block) 11/01/2013  . HTN (hypertension) 11/01/2013  . Obesity, unspecified 11/01/2013  . Pure hypercholesterolemia 11/01/2013  . CARCINOMA, BASAL CELL 07/29/2009  . Hyperlipemia, mixed 07/29/2009  . GLAUCOMA 07/29/2009  . Essential hypertension 07/29/2009  . Osteoarthritis 07/29/2009  . COLONIC POLYPS, HX OF 07/29/2009   Past Medical History:  Diagnosis Date  . Allergy    occasionally has  hayfever  . Anxiety   . Breast cancer ([facility]) 2017  . Cancer ([facility]) 1987   cancerous cells in vaginal wall seemed to be metastatic from the gut but no primary was ever found   . Chicken pox   . Constipation   . Depression   . DJD (degenerative joint disease)   . GERD (gastroesophageal reflux disease)   . Glaucoma    glaucoma and cataracts sees Dr Satira Sark   . Hyperlipemia   . Hypertension    pt denies currently. " Prednisone made it high."  . Lumbar disc disease    sees Dr. Sherwood Gambler   . Osteoarthritis   . Osteopenia   . PMR (polymyalgia rheumatica) (HCC)    sees Dr. Leigh Aurora   . Radiation    02-25-16 last radiation for breast cancer  . Transfusion history    blood     Past Surgical History:  Procedure Laterality Date  . ABDOMINAL HYSTERECTOMY     TAH BSO 1985  . APPENDECTOMY    . BASAL CELL CARCINOMA EXCISION     nose and rt thigh  . BREAST BIOPSY      x 4  . BREAST LUMPECTOMY WITH RADIOACTIVE SEED LOCALIZATION Right 12/25/2015   Procedure: RIGHT BREAST LUMPECTOMY WITH RADIOACTIVE SEED LOCALIZATION;  Surgeon: Rolm Bookbinder, MD;  Location: [facility];  Service: General;  Laterality: Right;  . CATARACT EXTRACTION     01-27-2011 left, 02-17-2011 right   . CERVICAL LAMINECTOMY     1983  . cervical microdiskectomy     1990 Dr Jovita Gamma  . CESAREAN SECTION    . COLONOSCOPY  03/19/2016   per Dr. Silverio Decamp, adenomatous polyps, repeat in one  year   . EYE SURGERY     bil cataracts with lens implant  . FOOT ARTHRODESIS, MODIFIED [facility]BR[facility]    . LUMBAR LAMINECTOMY     1990 Dr Jovita Gamma  . MYELOGRAM    . POLYPECTOMY    . REFRACTIVE SURGERY     01-29-2009 04-07-2009 Dr Foye Clock in [address]  . spinal injection     07-30-2012, 09-24-2012  . TENDON REPAIR     rt wrist  . TOTAL HIP ARTHROPLASTY Right 05/13/2016   Procedure: RIGHT TOTAL HIP ARTHROPLASTY ANTERIOR APPROACH;  Surgeon: Rod Can, MD;  Location: [facility];  Service: Orthopedics;  Laterality:  Right;  [place] RNFA  . VAGINAL BIRTH AFTER CESAREAN SECTION     x3     (Not in a hospital admission) No Known Allergies  Social History  Substance Use Topics  . Smoking status: Former Smoker    Packs/day: 0.50    Years: 36.00    Types: Cigarettes    Quit date: 07/06/1995  . Smokeless tobacco: Current User    Types: Snuff, Chew  . Alcohol use No     Comment: no wine at present     Family History  Problem Relation Age of Onset  . Alzheimer's disease Father   . Heart failure Father     d. 51  . Stroke Mother 47  . Cancer Sister     ovarian  . Alzheimer's disease Paternal Aunt   . Breast cancer Paternal Aunt   . Colon cancer Brother     dx. 59s  . Stroke Maternal Aunt     d. 6s  . Heart disease Maternal Grandfather   . Diabetes Maternal Grandfather   . Stroke Paternal Grandmother 73  . Heart attack Paternal Grandfather     d. 6s  . Ovarian cancer Sister     dx. 74s; no genetic testing  . Leukemia Grandchild 21  . Skin cancer Grandchild   . Cervical cancer Cousin 82    maternal 1st cousin  . Alzheimer's disease Paternal Aunt     (x4) paternal aunts  . Heart attack Paternal Uncle 24  . Colon polyps Neg Hx   . Rectal cancer Neg Hx   . Stomach cancer Neg Hx      Review of Systems  Constitutional: Positive for malaise/fatigue and weight loss.  HENT: Negative.   Eyes: Negative.   Respiratory: Negative.   Cardiovascular: Negative.   Gastrointestinal: Positive for nausea.  Genitourinary: Positive for frequency.  Musculoskeletal: Positive for back pain, joint pain and myalgias.  Skin: Positive for rash.  Neurological: Negative.   Endo/Heme/Allergies: Negative.   Psychiatric/Behavioral: Negative.     Objective:  Physical Exam  Vitals reviewed. Constitutional: She is oriented to person, place, and time. She appears well-developed and well-nourished.  HENT:  Head: Normocephalic and atraumatic.  Eyes: Conjunctivae and EOM are normal. Pupils are equal, round,  and reactive to light.  Neck: Normal range of motion. Neck supple.  Cardiovascular: Normal rate, regular rhythm and intact distal pulses.   Respiratory: Effort normal. No respiratory distress.  GI: Soft. She exhibits no distension.  Genitourinary:  Genitourinary Comments: deferred  Musculoskeletal:       Left hip: She exhibits decreased range of motion and bony tenderness.  Neurological: She is alert and oriented to person, place, and time. She has normal reflexes.  Skin: Skin is warm and dry.  Psychiatric: She has a normal mood and affect. Her behavior is normal. Judgment and thought  content normal.    Vital signs in last 24 hours: '@VSRANGES'$ @  Labs:   Estimated body mass index is 33.48 kg/m as calculated from the following:   Height as of 10/29/16: '5\' 3"'$  (1.6 m).   Weight as of 10/29/16: 85.7 kg (189 lb).   Imaging Review Plain radiographs demonstrate severe degenerative joint disease of the left hip(s). The bone quality appears to be adequate for age and reported activity level.  Assessment/Plan:  End stage arthritis, left hip(s)  The patient history, physical examination, clinical judgement of the provider and imaging studies are consistent with end stage degenerative joint disease of the left hip(s) and total hip arthroplasty is deemed medically necessary. The treatment options including medical management, injection therapy, arthroscopy and arthroplasty were discussed at length. The risks and benefits of total hip arthroplasty were presented and reviewed. The risks due to aseptic loosening, infection, stiffness, dislocation/subluxation,  thromboembolic complications and other imponderables were discussed.  The patient acknowledged the explanation, agreed to proceed with the plan and consent was signed. Patient is being admitted for inpatient treatment for surgery, pain control, PT, OT, prophylactic antibiotics, VTE prophylaxis, progressive ambulation and ADL's and discharge  planning.The patient is planning to be discharged home with HEP

## 2016-11-05 ENCOUNTER — Encounter (HOSPITAL_COMMUNITY)
Admission: RE | Admit: 2016-11-05 | Discharge: 2016-11-05 | Disposition: A | Discharge status: Home / Self Care | Payer: Medicare Other | Source: Ambulatory Visit | Attending: Orthopedic Surgery | Admitting: Orthopedic Surgery

## 2016-11-05 ENCOUNTER — Encounter (HOSPITAL_COMMUNITY): Payer: Medicare Other

## 2016-11-05 ENCOUNTER — Encounter (HOSPITAL_COMMUNITY): Payer: Self-pay

## 2016-11-05 DIAGNOSIS — Z01818 Encounter for other preprocedural examination: Secondary | ICD-10-CM | POA: Diagnosis present

## 2016-11-05 DIAGNOSIS — M1612 Unilateral primary osteoarthritis, left hip: Secondary | ICD-10-CM | POA: Diagnosis not present

## 2016-11-05 HISTORY — DX: Cardiac arrhythmia, unspecified: I49.9

## 2016-11-05 LAB — SURGICAL PCR SCREEN
MRSA, PCR: NEGATIVE
Staphylococcus aureus: NEGATIVE

## 2016-11-05 LAB — TYPE AND SCREEN
ABO/RH(D): O NEG
ANTIBODY SCREEN: NEGATIVE

## 2016-11-11 ENCOUNTER — Inpatient Hospital Stay (HOSPITAL_COMMUNITY): Payer: Medicare Other

## 2016-11-11 ENCOUNTER — Inpatient Hospital Stay (HOSPITAL_COMMUNITY): Payer: Medicare Other | Admitting: Certified Registered Nurse Anesthetist

## 2016-11-11 ENCOUNTER — Encounter (HOSPITAL_COMMUNITY)
Admission: RE | Disposition: A | Discharge status: Home / Self Care | Payer: Self-pay | Source: Ambulatory Visit | Attending: Orthopedic Surgery

## 2016-11-11 ENCOUNTER — Encounter (HOSPITAL_COMMUNITY): Payer: Self-pay | Admitting: *Deleted

## 2016-11-11 ENCOUNTER — Inpatient Hospital Stay (HOSPITAL_COMMUNITY)
Admission: RE | Admit: 2016-11-11 | Discharge: 2016-11-12 | DRG: 470 | Disposition: A | Discharge status: Home / Self Care | Payer: Medicare Other | Source: Ambulatory Visit | Attending: Orthopedic Surgery | Admitting: Orthopedic Surgery

## 2016-11-11 DIAGNOSIS — E782 Mixed hyperlipidemia: Secondary | ICD-10-CM | POA: Diagnosis not present

## 2016-11-11 DIAGNOSIS — Z833 Family history of diabetes mellitus: Secondary | ICD-10-CM | POA: Diagnosis not present

## 2016-11-11 DIAGNOSIS — I10 Essential (primary) hypertension: Secondary | ICD-10-CM | POA: Diagnosis present

## 2016-11-11 DIAGNOSIS — M25552 Pain in left hip: Secondary | ICD-10-CM | POA: Diagnosis present

## 2016-11-11 DIAGNOSIS — E7800 Pure hypercholesterolemia, unspecified: Secondary | ICD-10-CM | POA: Diagnosis not present

## 2016-11-11 DIAGNOSIS — Z85828 Personal history of other malignant neoplasm of skin: Secondary | ICD-10-CM | POA: Diagnosis not present

## 2016-11-11 DIAGNOSIS — M1612 Unilateral primary osteoarthritis, left hip: Secondary | ICD-10-CM | POA: Diagnosis not present

## 2016-11-11 DIAGNOSIS — F17220 Nicotine dependence, chewing tobacco, uncomplicated: Secondary | ICD-10-CM | POA: Diagnosis not present

## 2016-11-11 DIAGNOSIS — M353 Polymyalgia rheumatica: Secondary | ICD-10-CM | POA: Diagnosis not present

## 2016-11-11 DIAGNOSIS — Z09 Encounter for follow-up examination after completed treatment for conditions other than malignant neoplasm: Secondary | ICD-10-CM

## 2016-11-11 DIAGNOSIS — Z8249 Family history of ischemic heart disease and other diseases of the circulatory system: Secondary | ICD-10-CM

## 2016-11-11 DIAGNOSIS — H409 Unspecified glaucoma: Secondary | ICD-10-CM | POA: Diagnosis not present

## 2016-11-11 DIAGNOSIS — K219 Gastro-esophageal reflux disease without esophagitis: Secondary | ICD-10-CM | POA: Diagnosis present

## 2016-11-11 DIAGNOSIS — Z90710 Acquired absence of both cervix and uterus: Secondary | ICD-10-CM | POA: Diagnosis not present

## 2016-11-11 DIAGNOSIS — Z96642 Presence of left artificial hip joint: Secondary | ICD-10-CM | POA: Diagnosis not present

## 2016-11-11 DIAGNOSIS — Z471 Aftercare following joint replacement surgery: Secondary | ICD-10-CM | POA: Diagnosis not present

## 2016-11-11 HISTORY — PX: TOTAL HIP ARTHROPLASTY: SHX124

## 2016-11-11 SURGERY — ARTHROPLASTY, HIP, TOTAL, ANTERIOR APPROACH
Anesthesia: Monitor Anesthesia Care | Site: Hip | Laterality: Left

## 2016-11-11 MED ORDER — MENTHOL 3 MG MT LOZG: 1.0000 | LOZENGE | OROMUCOSAL | Status: DC | PRN

## 2016-11-11 MED ORDER — ONDANSETRON HCL 4 MG/2ML IJ SOLN
INTRAMUSCULAR | Status: DC | PRN
  Administered 2016-11-11: 4 mg via INTRAVENOUS

## 2016-11-11 MED ORDER — OXYCODONE HCL 5 MG/5ML PO SOLN: 5.0000 mg | Freq: Once | ORAL | Status: DC | PRN

## 2016-11-11 MED ORDER — DOCUSATE SODIUM 100 MG PO CAPS
100.0000 mg | ORAL_CAPSULE | Freq: Two times a day (BID) | ORAL | Status: DC
  Administered 2016-11-12: 100 mg via ORAL
  Filled 2016-11-11 (×3): qty 1

## 2016-11-11 MED ORDER — DORZOLAMIDE HCL 2 % OP SOLN
1.0000 [drp] | Freq: Two times a day (BID) | OPHTHALMIC | Status: DC
  Administered 2016-11-11 – 2016-11-12 (×3): 1 [drp] via OPHTHALMIC
  Filled 2016-11-11: qty 10

## 2016-11-11 MED ORDER — MIDAZOLAM HCL 5 MG/5ML IJ SOLN
INTRAMUSCULAR | Status: DC | PRN
  Administered 2016-11-11: 2 mg via INTRAVENOUS

## 2016-11-11 MED ORDER — VITAMIN B-12 1000 MCG PO TABS: 2000.0000 ug | ORAL_TABLET | Freq: Every day | ORAL | Status: DC

## 2016-11-11 MED ORDER — GLYCOPYRROLATE 0.2 MG/ML IJ SOLN
INTRAMUSCULAR | Status: DC | PRN
  Administered 2016-11-11: 0.2 mg via INTRAVENOUS

## 2016-11-11 MED ORDER — POLYETHYLENE GLYCOL 3350 17 G PO PACK: 17.0000 g | PACK | Freq: Every day | ORAL | Status: DC | PRN

## 2016-11-11 MED ORDER — ASPIRIN 81 MG PO CHEW
81.0000 mg | CHEWABLE_TABLET | Freq: Two times a day (BID) | ORAL | Status: DC
  Administered 2016-11-12: 81 mg via ORAL
  Filled 2016-11-11 (×2): qty 1

## 2016-11-11 MED ORDER — DEXTROMETHORPHAN-GUAIFENESIN 20-400 MG PO TABS: 1.0000 | ORAL_TABLET | Freq: Two times a day (BID) | ORAL | Status: DC | PRN

## 2016-11-11 MED ORDER — GLYCOPYRROLATE 0.2 MG/ML IV SOSY
PREFILLED_SYRINGE | INTRAVENOUS | Status: AC
  Filled 2016-11-11: qty 5

## 2016-11-11 MED ORDER — ALBUMIN HUMAN 5 % IV SOLN
12.5000 g | Freq: Once | INTRAVENOUS | Status: AC
  Administered 2016-11-11: 12.5 g via INTRAVENOUS

## 2016-11-11 MED ORDER — PHENYLEPHRINE HCL 10 MG/ML IJ SOLN
INTRAVENOUS | Status: DC | PRN
  Administered 2016-11-11: 50 ug/min via INTRAVENOUS

## 2016-11-11 MED ORDER — SENNA 8.6 MG PO TABS
2.0000 | ORAL_TABLET | Freq: Every day | ORAL | Status: DC
  Administered 2016-11-11: 17.2 mg via ORAL
  Filled 2016-11-11: qty 2

## 2016-11-11 MED ORDER — PROPYLENE GLYCOL 0.6 % OP SOLN: 1.0000 [drp] | Freq: Four times a day (QID) | OPHTHALMIC | Status: DC | PRN

## 2016-11-11 MED ORDER — DEXTROSE 5 % IV SOLN
500.0000 mg | Freq: Four times a day (QID) | INTRAVENOUS | Status: DC | PRN
  Filled 2016-11-11: qty 5

## 2016-11-11 MED ORDER — LORATADINE 10 MG PO TABS
10.0000 mg | ORAL_TABLET | Freq: Every day | ORAL | Status: DC
  Administered 2016-11-12: 10 mg via ORAL
  Filled 2016-11-11: qty 1

## 2016-11-11 MED ORDER — KETOROLAC TROMETHAMINE 15 MG/ML IJ SOLN
7.5000 mg | Freq: Four times a day (QID) | INTRAMUSCULAR | Status: DC
  Administered 2016-11-11 – 2016-11-12 (×3): 7.5 mg via INTRAVENOUS
  Filled 2016-11-11 (×3): qty 1

## 2016-11-11 MED ORDER — SODIUM CHLORIDE 0.9 % IV SOLN
INTRAVENOUS | Status: DC
  Administered 2016-11-11 – 2016-11-12 (×2): via INTRAVENOUS

## 2016-11-11 MED ORDER — LOSARTAN POTASSIUM 50 MG PO TABS: 50.0000 mg | ORAL_TABLET | Freq: Every day | ORAL | Status: DC

## 2016-11-11 MED ORDER — CEFAZOLIN SODIUM-DEXTROSE 2-4 GM/100ML-% IV SOLN
2.0000 g | Freq: Four times a day (QID) | INTRAVENOUS | Status: AC
  Administered 2016-11-11 – 2016-11-12 (×2): 2 g via INTRAVENOUS
  Filled 2016-11-11 (×2): qty 100

## 2016-11-11 MED ORDER — CEFAZOLIN SODIUM-DEXTROSE 2-4 GM/100ML-% IV SOLN
2.0000 g | INTRAVENOUS | Status: AC
  Administered 2016-11-11: 2 g via INTRAVENOUS
  Filled 2016-11-11: qty 100

## 2016-11-11 MED ORDER — MIDAZOLAM HCL 2 MG/2ML IJ SOLN
INTRAMUSCULAR | Status: AC
  Filled 2016-11-11: qty 2

## 2016-11-11 MED ORDER — LACTATED RINGERS IV SOLN
INTRAVENOUS | Status: DC
  Administered 2016-11-11 (×2): via INTRAVENOUS

## 2016-11-11 MED ORDER — DM-GUAIFENESIN ER 30-600 MG PO TB12: 1.0000 | ORAL_TABLET | Freq: Two times a day (BID) | ORAL | Status: DC | PRN

## 2016-11-11 MED ORDER — SODIUM CHLORIDE 0.9 % IR SOLN
Status: DC | PRN
  Administered 2016-11-11: 1000 mL

## 2016-11-11 MED ORDER — HYDROMORPHONE HCL 1 MG/ML IJ SOLN
0.5000 mg | INTRAMUSCULAR | Status: DC | PRN
  Administered 2016-11-11: 1 mg via INTRAVENOUS
  Filled 2016-11-11: qty 1

## 2016-11-11 MED ORDER — ONDANSETRON HCL 4 MG PO TABS
4.0000 mg | ORAL_TABLET | Freq: Four times a day (QID) | ORAL | Status: DC | PRN
  Filled 2016-11-11: qty 1

## 2016-11-11 MED ORDER — BUPIVACAINE HCL (PF) 0.5 % IJ SOLN
INTRAMUSCULAR | Status: DC | PRN
  Administered 2016-11-11: 3 mL via INTRATHECAL

## 2016-11-11 MED ORDER — ONDANSETRON HCL 4 MG/2ML IJ SOLN
4.0000 mg | Freq: Four times a day (QID) | INTRAMUSCULAR | Status: DC | PRN
  Administered 2016-11-12: 4 mg via INTRAVENOUS
  Filled 2016-11-11: qty 2

## 2016-11-11 MED ORDER — SODIUM CHLORIDE 0.9 % IV SOLN: INTRAVENOUS | Status: DC

## 2016-11-11 MED ORDER — ADULT MULTIVITAMIN W/MINERALS CH: 1.0000 | ORAL_TABLET | Freq: Every day | ORAL | Status: DC

## 2016-11-11 MED ORDER — PROPOFOL 500 MG/50ML IV EMUL
INTRAVENOUS | Status: DC | PRN
  Administered 2016-11-11: 50 ug/kg/min via INTRAVENOUS

## 2016-11-11 MED ORDER — ACETAMINOPHEN 10 MG/ML IV SOLN
1000.0000 mg | INTRAVENOUS | Status: AC
  Administered 2016-11-11: 1000 mg via INTRAVENOUS
  Filled 2016-11-11: qty 100

## 2016-11-11 MED ORDER — METHOCARBAMOL 500 MG PO TABS
500.0000 mg | ORAL_TABLET | Freq: Four times a day (QID) | ORAL | Status: DC | PRN
  Administered 2016-11-11 – 2016-11-12 (×2): 500 mg via ORAL
  Filled 2016-11-11 (×2): qty 1

## 2016-11-11 MED ORDER — BUPIVACAINE HCL (PF) 0.25 % IJ SOLN
INTRAMUSCULAR | Status: AC
  Filled 2016-11-11: qty 30

## 2016-11-11 MED ORDER — METOCLOPRAMIDE HCL 5 MG/ML IJ SOLN: 5.0000 mg | Freq: Three times a day (TID) | INTRAMUSCULAR | Status: DC | PRN

## 2016-11-11 MED ORDER — DEXAMETHASONE SODIUM PHOSPHATE 4 MG/ML IJ SOLN
INTRAMUSCULAR | Status: DC | PRN
  Administered 2016-11-11: 4 mg via INTRAVENOUS

## 2016-11-11 MED ORDER — FENTANYL CITRATE (PF) 100 MCG/2ML IJ SOLN
INTRAMUSCULAR | Status: AC
  Filled 2016-11-11: qty 2

## 2016-11-11 MED ORDER — POTASSIUM CHLORIDE CRYS ER 10 MEQ PO TBCR: 20.0000 meq | EXTENDED_RELEASE_TABLET | Freq: Every day | ORAL | Status: DC

## 2016-11-11 MED ORDER — FENTANYL CITRATE (PF) 100 MCG/2ML IJ SOLN
INTRAMUSCULAR | Status: DC | PRN
Start: 2016-11-11 — End: 2016-11-11
  Administered 2016-11-11: 50 ug via INTRAVENOUS

## 2016-11-11 MED ORDER — FENTANYL CITRATE (PF) 100 MCG/2ML IJ SOLN: 25.0000 ug | INTRAMUSCULAR | Status: DC | PRN

## 2016-11-11 MED ORDER — POLYVINYL ALCOHOL 1.4 % OP SOLN
1.0000 [drp] | Freq: Four times a day (QID) | OPHTHALMIC | Status: DC | PRN
  Filled 2016-11-11: qty 15

## 2016-11-11 MED ORDER — ACETAMINOPHEN 325 MG PO TABS: 650.0000 mg | ORAL_TABLET | Freq: Four times a day (QID) | ORAL | Status: DC | PRN

## 2016-11-11 MED ORDER — PHENOL 1.4 % MT LIQD: 1.0000 | OROMUCOSAL | Status: DC | PRN

## 2016-11-11 MED ORDER — OMEGA-3-ACID ETHYL ESTERS 1 G PO CAPS
1.0000 g | ORAL_CAPSULE | Freq: Every day | ORAL | Status: DC
  Administered 2016-11-12: 1 g via ORAL
  Filled 2016-11-11: qty 1

## 2016-11-11 MED ORDER — LATANOPROST 0.005 % OP SOLN
1.0000 [drp] | Freq: Every day | OPHTHALMIC | Status: DC
  Administered 2016-11-11: 1 [drp] via OPHTHALMIC
  Filled 2016-11-11: qty 2.5

## 2016-11-11 MED ORDER — BUPIVACAINE HCL (PF) 0.5 % IJ SOLN
INTRAMUSCULAR | Status: AC
  Filled 2016-11-11: qty 30

## 2016-11-11 MED ORDER — ALBUMIN HUMAN 5 % IV SOLN
INTRAVENOUS | Status: AC
  Filled 2016-11-11: qty 250

## 2016-11-11 MED ORDER — LINACLOTIDE 72 MCG PO CAPS: 72.0000 ug | ORAL_CAPSULE | Freq: Every day | ORAL | Status: DC

## 2016-11-11 MED ORDER — EPHEDRINE SULFATE 50 MG/ML IJ SOLN
INTRAMUSCULAR | Status: DC | PRN
  Administered 2016-11-11 (×3): 10 mg via INTRAVENOUS

## 2016-11-11 MED ORDER — DIPHENHYDRAMINE HCL 12.5 MG/5ML PO ELIX: 12.5000 mg | ORAL_SOLUTION | Freq: Two times a day (BID) | ORAL | Status: DC | PRN

## 2016-11-11 MED ORDER — FUROSEMIDE 40 MG PO TABS
40.0000 mg | ORAL_TABLET | Freq: Two times a day (BID) | ORAL | Status: DC
  Administered 2016-11-12: 40 mg via ORAL
  Filled 2016-11-11: qty 1

## 2016-11-11 MED ORDER — OMEGA 3 1200 MG PO CAPS: 1200.0000 mg | ORAL_CAPSULE | Freq: Every day | ORAL | Status: DC

## 2016-11-11 MED ORDER — PHENYLEPHRINE HCL 10 MG/ML IJ SOLN
INTRAMUSCULAR | Status: DC | PRN
  Administered 2016-11-11: 80 ug via INTRAVENOUS

## 2016-11-11 MED ORDER — PROPOFOL 10 MG/ML IV BOLUS
INTRAVENOUS | Status: AC
  Filled 2016-11-11: qty 60

## 2016-11-11 MED ORDER — ISOPROPYL ALCOHOL 70 % SOLN
Status: DC | PRN
  Administered 2016-11-11: 1 via TOPICAL

## 2016-11-11 MED ORDER — SODIUM CHLORIDE 0.9 % IJ SOLN
INTRAMUSCULAR | Status: DC | PRN
  Administered 2016-11-11: 30 mL

## 2016-11-11 MED ORDER — LIDOCAINE 2% (20 MG/ML) 5 ML SYRINGE
INTRAMUSCULAR | Status: AC
  Filled 2016-11-11: qty 5

## 2016-11-11 MED ORDER — CHLORHEXIDINE GLUCONATE 4 % EX LIQD: 60.0000 mL | Freq: Once | CUTANEOUS | Status: DC

## 2016-11-11 MED ORDER — WATER FOR IRRIGATION, STERILE IR SOLN
Status: DC | PRN
  Administered 2016-11-11: 2000 mL

## 2016-11-11 MED ORDER — PHENYLEPHRINE HCL 10 MG/ML IJ SOLN
INTRAMUSCULAR | Status: AC
  Filled 2016-11-11: qty 1

## 2016-11-11 MED ORDER — BUPIVACAINE HCL (PF) 0.25 % IJ SOLN
INTRAMUSCULAR | Status: DC | PRN
  Administered 2016-11-11: 30 mL

## 2016-11-11 MED ORDER — OXYMETAZOLINE HCL 0.05 % NA SOLN
1.0000 | Freq: Two times a day (BID) | NASAL | Status: DC | PRN
  Filled 2016-11-11: qty 15

## 2016-11-11 MED ORDER — PANTOPRAZOLE SODIUM 40 MG PO TBEC
40.0000 mg | DELAYED_RELEASE_TABLET | Freq: Every day | ORAL | Status: DC
  Administered 2016-11-12: 40 mg via ORAL
  Filled 2016-11-11 (×3): qty 1

## 2016-11-11 MED ORDER — DEXAMETHASONE SODIUM PHOSPHATE 10 MG/ML IJ SOLN
10.0000 mg | Freq: Once | INTRAMUSCULAR | Status: AC
  Administered 2016-11-12: 10 mg via INTRAVENOUS
  Filled 2016-11-11: qty 1

## 2016-11-11 MED ORDER — METOCLOPRAMIDE HCL 5 MG PO TABS: 5.0000 mg | ORAL_TABLET | Freq: Three times a day (TID) | ORAL | Status: DC | PRN

## 2016-11-11 MED ORDER — KETOROLAC TROMETHAMINE 30 MG/ML IJ SOLN
INTRAMUSCULAR | Status: AC
  Filled 2016-11-11: qty 1

## 2016-11-11 MED ORDER — CALCIUM CARBONATE-VITAMIN D 500-200 MG-UNIT PO TABS
1.0000 | ORAL_TABLET | Freq: Two times a day (BID) | ORAL | Status: DC
  Administered 2016-11-11 – 2016-11-12 (×2): 1 via ORAL
  Filled 2016-11-11 (×2): qty 1

## 2016-11-11 MED ORDER — KETOROLAC TROMETHAMINE 30 MG/ML IJ SOLN
INTRAMUSCULAR | Status: DC | PRN
  Administered 2016-11-11: 30 mg

## 2016-11-11 MED ORDER — SODIUM CHLORIDE 0.9 % IJ SOLN
INTRAMUSCULAR | Status: AC
  Filled 2016-11-11: qty 50

## 2016-11-11 MED ORDER — TRANEXAMIC ACID 1000 MG/10ML IV SOLN
1000.0000 mg | Freq: Once | INTRAVENOUS | Status: AC
  Administered 2016-11-11: 1000 mg via INTRAVENOUS
  Filled 2016-11-11: qty 1100

## 2016-11-11 MED ORDER — OXYCODONE HCL 5 MG PO TABS: 5.0000 mg | ORAL_TABLET | Freq: Once | ORAL | Status: DC | PRN

## 2016-11-11 MED ORDER — SIMVASTATIN 20 MG PO TABS
20.0000 mg | ORAL_TABLET | Freq: Every day | ORAL | Status: DC
  Administered 2016-11-11: 20 mg via ORAL
  Filled 2016-11-11: qty 1

## 2016-11-11 MED ORDER — CALCIUM CARB-CHOLECALCIFEROL 600-800 MG-UNIT PO TABS: 1.0000 | ORAL_TABLET | Freq: Two times a day (BID) | ORAL | Status: DC

## 2016-11-11 MED ORDER — TRANEXAMIC ACID 1000 MG/10ML IV SOLN
1000.0000 mg | INTRAVENOUS | Status: AC
  Administered 2016-11-11: 1000 mg via INTRAVENOUS
  Filled 2016-11-11: qty 1100

## 2016-11-11 MED ORDER — HYDROCODONE-ACETAMINOPHEN 5-325 MG PO TABS
1.0000 | ORAL_TABLET | ORAL | Status: DC | PRN
  Administered 2016-11-11 – 2016-11-12 (×4): 2 via ORAL
  Filled 2016-11-11 (×4): qty 2

## 2016-11-11 MED ORDER — ONDANSETRON HCL 4 MG/2ML IJ SOLN: 4.0000 mg | Freq: Once | INTRAMUSCULAR | Status: DC | PRN

## 2016-11-11 MED ORDER — ACETAMINOPHEN 650 MG RE SUPP: 650.0000 mg | Freq: Four times a day (QID) | RECTAL | Status: DC | PRN

## 2016-11-11 MED ORDER — AMLODIPINE BESYLATE 5 MG PO TABS
5.0000 mg | ORAL_TABLET | Freq: Every evening | ORAL | Status: DC
  Administered 2016-11-11: 5 mg via ORAL
  Filled 2016-11-11: qty 1

## 2016-11-11 SURGICAL SUPPLY — 45 items
ADH SKN CLS APL DERMABOND .7 (GAUZE/BANDAGES/DRESSINGS) ×2
BAG DECANTER FOR FLEXI CONT (MISCELLANEOUS) IMPLANT
BAG SPEC THK2 15X12 ZIP CLS (MISCELLANEOUS)
BAG ZIPLOCK 12X15 (MISCELLANEOUS) IMPLANT
CAPT HIP TOTAL 2 ×1 IMPLANT
CHLORAPREP W/TINT 26ML (MISCELLANEOUS) ×2 IMPLANT
CLOTH BEACON ORANGE TIMEOUT ST (SAFETY) ×2 IMPLANT
COVER PERINEAL POST (MISCELLANEOUS) ×2 IMPLANT
COVER SURGICAL LIGHT HANDLE (MISCELLANEOUS) ×2 IMPLANT
DECANTER SPIKE VIAL GLASS SM (MISCELLANEOUS) ×2 IMPLANT
DERMABOND ADVANCED (GAUZE/BANDAGES/DRESSINGS) ×2
DERMABOND ADVANCED .7 DNX12 (GAUZE/BANDAGES/DRESSINGS) ×2 IMPLANT
DRAPE SHEET LG 3/4 BI-LAMINATE (DRAPES) ×6 IMPLANT
DRAPE STERI IOBAN 125X83 (DRAPES) ×2 IMPLANT
DRAPE U-SHAPE 47X51 STRL (DRAPES) ×4 IMPLANT
DRSG AQUACEL AG ADV 3.5X10 (GAUZE/BANDAGES/DRESSINGS) ×2 IMPLANT
ELECT PENCIL ROCKER SW 15FT (MISCELLANEOUS) ×2 IMPLANT
ELECT REM PT RETURN 15FT ADLT (MISCELLANEOUS) ×2 IMPLANT
GAUZE SPONGE 4X4 12PLY STRL (GAUZE/BANDAGES/DRESSINGS) ×2 IMPLANT
GLOVE BIO SURGEON STRL SZ8.5 (GLOVE) ×4 IMPLANT
GLOVE BIOGEL PI IND STRL 8.5 (GLOVE) ×1 IMPLANT
GLOVE BIOGEL PI INDICATOR 8.5 (GLOVE) ×1
GOWN SPEC L3 XXLG W/TWL (GOWN DISPOSABLE) ×2 IMPLANT
HANDPIECE INTERPULSE COAX TIP (DISPOSABLE) ×2
HOLDER FOLEY CATH W/STRAP (MISCELLANEOUS) ×2 IMPLANT
HOOD PEEL AWAY FLYTE STAYCOOL (MISCELLANEOUS) ×4 IMPLANT
MARKER SKIN DUAL TIP RULER LAB (MISCELLANEOUS) ×2 IMPLANT
NDL SPNL 18GX3.5 QUINCKE PK (NEEDLE) ×1 IMPLANT
NEEDLE SPNL 18GX3.5 QUINCKE PK (NEEDLE) ×2 IMPLANT
PACK ANTERIOR HIP CUSTOM (KITS) ×2 IMPLANT
SAW OSC TIP CART 19.5X105X1.3 (SAW) ×2 IMPLANT
SEALER BIPOLAR AQUA 6.0 (INSTRUMENTS) ×2 IMPLANT
SET HNDPC FAN SPRY TIP SCT (DISPOSABLE) ×1 IMPLANT
SUT ETHIBOND NAB CT1 #1 30IN (SUTURE) ×4 IMPLANT
SUT MNCRL AB 3-0 PS2 18 (SUTURE) ×2 IMPLANT
SUT MON AB 2-0 CT1 36 (SUTURE) ×4 IMPLANT
SUT STRATAFIX PDO 1 14 VIOLET (SUTURE) ×2
SUT STRATFX PDO 1 14 VIOLET (SUTURE) ×1
SUT VIC AB 2-0 CT1 27 (SUTURE) ×2
SUT VIC AB 2-0 CT1 TAPERPNT 27 (SUTURE) ×1 IMPLANT
SUTURE STRATFX PDO 1 14 VIOLET (SUTURE) ×1 IMPLANT
SYR 50ML LL SCALE MARK (SYRINGE) ×2 IMPLANT
TRAY FOLEY W/METER SILVER 16FR (SET/KITS/TRAYS/PACK) ×1 IMPLANT
WATER STERILE IRR 1500ML POUR (IV SOLUTION) ×2 IMPLANT
YANKAUER SUCT BULB TIP 10FT TU (MISCELLANEOUS) ×2 IMPLANT

## 2016-11-11 NOTE — Interval H&P Note (Signed)
History and Physical Interval Note:  11/11/2016 2:59 PM  Connie Lang  has presented today for surgery, with the diagnosis of Degenerative joint disease Left hip  The various methods of treatment have been discussed with the patient and family. After consideration of risks, benefits and other options for treatment, the patient has consented to  Procedure(s) with comments: LEFT TOTAL HIP ARTHROPLASTY ANTERIOR APPROACH (Left) - Dr. requesting RNFA as a surgical intervention .  The patient's history has been reviewed, patient examined, no change in status, stable for surgery.  I have reviewed the patient's chart and labs.  Questions were answered to the patient's satisfaction.     Lurae Hornbrook, Horald Pollen

## 2016-11-11 NOTE — Anesthesia Procedure Notes (Signed)
Spinal  Patient location during procedure: OR Start time: 11/11/2016 3:33 PM End time: 11/11/2016 3:36 PM Staffing Anesthesiologist: Linna Caprice, DAVID Resident/CRNA: Claudia Desanctis Performed: resident/CRNA  Preanesthetic Checklist Completed: patient identified, site marked, surgical consent, pre-op evaluation, timeout performed, IV checked, risks and benefits discussed and monitors and equipment checked Spinal Block Patient position: sitting Prep: ChloraPrep Patient monitoring: heart rate, cardiac monitor, continuous pulse ox and blood pressure Approach: midline Location: L3-4 Injection technique: single-shot Needle Needle type: Pencan  Needle gauge: 24 G Needle length: 9 cm Needle insertion depth: 7 cm Assessment Sensory level: T6

## 2016-11-11 NOTE — Anesthesia Preprocedure Evaluation (Signed)
Anesthesia Evaluation  Patient identified by MRN, date of birth, ID band Patient awake    Reviewed: Allergy & Precautions, NPO status , Patient's Chart, lab work & pertinent test results  Airway Mallampati: II  TM Distance: >3 FB Neck ROM: Full    Dental  (+) Teeth Intact, Dental Advisory Given   Pulmonary former smoker,    breath sounds clear to auscultation       Cardiovascular hypertension,  Rhythm:Irregular Rate:Normal     Neuro/Psych    GI/Hepatic   Endo/Other    Renal/GU      Musculoskeletal   Abdominal   Peds  Hematology   Anesthesia Other Findings   Reproductive/Obstetrics                             Anesthesia Physical Anesthesia Plan  ASA: III  Anesthesia Plan: Spinal and MAC   Post-op Pain Management:    Induction: Intravenous  Airway Management Planned: Natural Airway and Simple Face Mask  Additional Equipment:   Intra-op Plan:   Post-operative Plan:   Informed Consent: I have reviewed the patients History and Physical, chart, labs and discussed the procedure including the risks, benefits and alternatives for the proposed anesthesia with the patient or authorized representative who has indicated his/her understanding and acceptance.   Dental advisory given  Plan Discussed with: CRNA and Anesthesiologist  Anesthesia Plan Comments:         Anesthesia Quick Evaluation

## 2016-11-11 NOTE — Anesthesia Postprocedure Evaluation (Addendum)
Anesthesia Post Note  Patient: Connie Lang  Procedure(s) Performed: Procedure(s) (LRB): LEFT TOTAL HIP ARTHROPLASTY ANTERIOR APPROACH (Left)  Patient location during evaluation: PACU Anesthesia Type: MAC Level of consciousness: awake, awake and alert and oriented Pain management: pain level controlled Vital Signs Assessment: post-procedure vital signs reviewed and stable Respiratory status: spontaneous breathing, nonlabored ventilation and respiratory function stable Cardiovascular status: blood pressure returned to baseline Postop Assessment: spinal receding Anesthetic complications: no       Last Vitals:  Vitals:   11/11/16 1830 11/11/16 1841  BP: (!) 117/57 (!) 128/45  Pulse: 95 73  Resp: (!) 23 16  Temp:  36.5 C    Last Pain:  Vitals:   11/11/16 1841  TempSrc: Oral  PainSc: 0-No pain                 Avyonna Wagoner COKER

## 2016-11-11 NOTE — Transfer of Care (Signed)
Immediate Anesthesia Transfer of Care Note  Patient: Connie Lang  Procedure(s) Performed: Procedure(s) with comments: LEFT TOTAL HIP ARTHROPLASTY ANTERIOR APPROACH (Left) - Dr. requesting RNFA  Patient Location: PACU  Anesthesia Type:Spinal  Level of Consciousness:  sedated, patient cooperative and responds to stimulation  Airway & Oxygen Therapy:Patient Spontanous Breathing and Patient connected to face mask oxgen  Post-op Assessment:  Report given to PACU RN and Post -op Vital signs reviewed and stable  Post vital signs:  Reviewed and stable  Last Vitals:  Vitals:   11/11/16 1333  BP: (!) 142/104  Pulse: (!) 58  Resp: 20  Temp: 72.2 C    Complications: No apparent anesthesia complications

## 2016-11-11 NOTE — Discharge Instructions (Signed)
°Dr. Kendrick Haapala °Joint Replacement Specialist °[facility] °[address] °[place], [place] [address] °[phone] ° ° °TOTAL HIP REPLACEMENT POSTOPERATIVE DIRECTIONS ° ° ° °Hip Rehabilitation, Guidelines Following Surgery  ° °WEIGHT BEARING °Weight bearing as tolerated with assist device (walker, cane, etc) as directed, use it as long as suggested by your surgeon or therapist, typically at least 4-6 weeks. ° °The results of a hip operation are greatly improved after range of motion and muscle strengthening exercises. Follow all safety measures which are given to protect your hip. If any of these exercises cause increased pain or swelling in your joint, decrease the amount until you are comfortable again. Then slowly increase the exercises. Call your caregiver if you have problems or questions.  ° °HOME CARE INSTRUCTIONS  °Most of the following instructions are designed to prevent the dislocation of your new hip.  °Remove items at home which could result in a fall. This includes throw rugs or furniture in walking pathways.  °Continue medications as instructed at time of discharge. °· You may have some home medications which will be placed on hold until you complete the course of blood thinner medication. °· You may start showering once you are discharged home. Do not remove your dressing. °Do not put on socks or shoes without following the instructions of your caregivers.   °Sit on chairs with arms. Use the chair arms to help push yourself up when arising.  °Arrange for the use of a toilet seat elevator so you are not sitting low.  °· Walk with walker as instructed.  °You may resume a sexual relationship in one month or when given the OK by your caregiver.  °Use walker as long as suggested by your caregivers.  °You may put full weight on your legs and walk as much as is comfortable. °Avoid periods of inactivity such as sitting longer than an hour when not asleep. This helps prevent  blood clots.  °You may return to work once you are cleared by your surgeon.  °Do not drive a car for 6 weeks or until released by your surgeon.  °Do not drive while taking narcotics.  °Wear elastic stockings for two weeks following surgery during the day but you may remove then at night.  °Make sure you keep all of your appointments after your operation with all of your doctors and caregivers. You should call the office at the above phone number and make an appointment for approximately two weeks after the date of your surgery. °Please pick up a stool softener and laxative for home use as long as you are requiring pain medications. °· ICE to the affected hip every three hours for 30 minutes at a time and then as needed for pain and swelling. Continue to use ice on the hip for pain and swelling from surgery. You may notice swelling that will progress down to the foot and ankle.  This is normal after surgery.  Elevate the leg when you are not up walking on it.   °It is important for you to complete the blood thinner medication as prescribed by your doctor. °· Continue to use the breathing machine which will help keep your temperature down.  It is common for your temperature to cycle up and down following surgery, especially at night when you are not up moving around and exerting yourself.  The breathing machine keeps your lungs expanded and your temperature down. ° °RANGE OF MOTION AND STRENGTHENING EXERCISES  °These exercises are   designed to help you keep full movement of your hip joint. Follow your caregiver's or physical therapist's instructions. Perform all exercises about fifteen times, three times per day or as directed. Exercise both hips, even if you have had only one joint replacement. These exercises can be done on a training (exercise) mat, on the floor, on a table or on a bed. Use whatever works the best and is most comfortable for you. Use music or television while you are exercising so that the exercises  are a pleasant break in your day. This will make your life better with the exercises acting as a break in routine you can look forward to.  °Lying on your back, slowly slide your foot toward your buttocks, raising your knee up off the floor. Then slowly slide your foot back down until your leg is straight again.  °Lying on your back spread your legs as far apart as you can without causing discomfort.  °Lying on your side, raise your upper leg and foot straight up from the floor as far as is comfortable. Slowly lower the leg and repeat.  °Lying on your back, tighten up the muscle in the front of your thigh (quadriceps muscles). You can do this by keeping your leg straight and trying to raise your heel off the floor. This helps strengthen the largest muscle supporting your knee.  °Lying on your back, tighten up the muscles of your buttocks both with the legs straight and with the knee bent at a comfortable angle while keeping your heel on the floor.  ° °SKILLED REHAB INSTRUCTIONS: °If the patient is transferred to a skilled rehab facility following release from the hospital, a list of the current medications will be sent to the facility for the patient to continue.  When discharged from the skilled rehab facility, please have the facility set up the patient's Home Health Physical Therapy prior to being released. Also, the skilled facility will be responsible for providing the patient with their medications at time of release from the facility to include their pain medication and their blood thinner medication. If the patient is still at the rehab facility at time of the two week follow up appointment, the skilled rehab facility will also need to assist the patient in arranging follow up appointment in our office and any transportation needs. ° °MAKE SURE YOU:  °Understand these instructions.  °Will watch your condition.  °Will get help right away if you are not doing well or get worse. ° °Pick up stool softner and  laxative for home use following surgery while on pain medications. °Do not remove your dressing. °The dressing is waterproof--it is OK to take showers. °Continue to use ice for pain and swelling after surgery. °Do not use any lotions or creams on the incision until instructed by your surgeon. °Total Hip Protocol. ° ° °

## 2016-11-11 NOTE — H&P (View-Only) (Signed)
TOTAL HIP ADMISSION H&P  Patient is admitted for left total hip arthroplasty.  Subjective:  Chief Complaint: left hip pain  HPI: Connie Lang, 76 y.o. female, has a history of pain and functional disability in the left hip(s) due to arthritis and patient has failed non-surgical conservative treatments for greater than 12 weeks to include NSAID's and/or analgesics, corticosteriod injections, flexibility and strengthening excercises, use of assistive devices, weight reduction as appropriate and activity modification.  Onset of symptoms was gradual starting 1 years ago with rapidlly worsening course since that time.The patient noted no past surgery on the left hip(s).  Patient currently rates pain in the left hip at 10 out of 10 with activity. Patient has night pain, worsening of pain with activity and weight bearing, pain that interfers with activities of daily living, pain with passive range of motion and crepitus. Patient has evidence of subchondral sclerosis and joint space narrowing by imaging studies. This condition presents safety issues increasing the risk of falls. There is no current active infection.  Patient Active Problem List   Diagnosis Date Noted  . Primary osteoarthritis of right hip 05/13/2016  . Family history of ovarian cancer 01/27/2016  . Family history of colon cancer 01/27/2016  . Breast cancer of upper-outer quadrant of right female breast ([place]) 12/03/2015  . Generalized edema 11/21/2015  . PMR (polymyalgia rheumatica) (HCC) 07/23/2015  . RBBB (right bundle branch block) 11/01/2013  . HTN (hypertension) 11/01/2013  . Obesity, unspecified 11/01/2013  . Pure hypercholesterolemia 11/01/2013  . CARCINOMA, BASAL CELL 07/29/2009  . Hyperlipemia, mixed 07/29/2009  . GLAUCOMA 07/29/2009  . Essential hypertension 07/29/2009  . Osteoarthritis 07/29/2009  . COLONIC POLYPS, HX OF 07/29/2009   Past Medical History:  Diagnosis Date  . Allergy    occasionally has  hayfever  . Anxiety   . Breast cancer ([place]) 2017  . Cancer ([place]) 1987   cancerous cells in vaginal wall seemed to be metastatic from the gut but no primary was ever found   . Chicken pox   . Constipation   . Depression   . DJD (degenerative joint disease)   . GERD (gastroesophageal reflux disease)   . Glaucoma    glaucoma and cataracts sees Dr Satira Sark   . Hyperlipemia   . Hypertension    pt denies currently. " Prednisone made it high."  . Lumbar disc disease    sees Dr. Sherwood Gambler   . Osteoarthritis   . Osteopenia   . PMR (polymyalgia rheumatica) (HCC)    sees Dr. Leigh Aurora   . Radiation    02-25-16 last radiation for breast cancer  . Transfusion history    blood     Past Surgical History:  Procedure Laterality Date  . ABDOMINAL HYSTERECTOMY     TAH BSO 1985  . APPENDECTOMY    . BASAL CELL CARCINOMA EXCISION     nose and rt thigh  . BREAST BIOPSY      x 4  . BREAST LUMPECTOMY WITH RADIOACTIVE SEED LOCALIZATION Right 12/25/2015   Procedure: RIGHT BREAST LUMPECTOMY WITH RADIOACTIVE SEED LOCALIZATION;  Surgeon: Rolm Bookbinder, MD;  Location: [facility];  Service: General;  Laterality: Right;  . CATARACT EXTRACTION     01-27-2011 left, 02-17-2011 right   . CERVICAL LAMINECTOMY     1983  . cervical microdiskectomy     1990 Dr Jovita Gamma  . CESAREAN SECTION    . COLONOSCOPY  03/19/2016   per Dr. Silverio Decamp, adenomatous polyps, repeat in one  year   . EYE SURGERY     bil cataracts with lens implant  . FOOT ARTHRODESIS, MODIFIED MCBRIDE    . LUMBAR LAMINECTOMY     1990 Dr Jovita Gamma  . MYELOGRAM    . POLYPECTOMY    . REFRACTIVE SURGERY     01-29-2009 04-07-2009 Dr Foye Clock in [place]  . spinal injection     07-30-2012, 09-24-2012  . TENDON REPAIR     rt wrist  . TOTAL HIP ARTHROPLASTY Right 05/13/2016   Procedure: RIGHT TOTAL HIP ARTHROPLASTY ANTERIOR APPROACH;  Surgeon: Rod Can, MD;  Location: [facility];  Service: Orthopedics;  Laterality:  Right;  [place] RNFA  . VAGINAL BIRTH AFTER CESAREAN SECTION     x3     (Not in a hospital admission) No Known Allergies  Social History  Substance Use Topics  . Smoking status: Former Smoker    Packs/day: 0.50    Years: 36.00    Types: Cigarettes    Quit date: 07/06/1995  . Smokeless tobacco: Current User    Types: Snuff, Chew  . Alcohol use No     Comment: no wine at present     Family History  Problem Relation Age of Onset  . Alzheimer's disease Father   . Heart failure Father     d. 70  . Stroke Mother 21  . Cancer Sister     ovarian  . Alzheimer's disease Paternal Aunt   . Breast cancer Paternal Aunt   . Colon cancer Brother     dx. 28s  . Stroke Maternal Aunt     d. 73s  . Heart disease Maternal Grandfather   . Diabetes Maternal Grandfather   . Stroke Paternal Grandmother 73  . Heart attack Paternal Grandfather     d. 59s  . Ovarian cancer Sister     dx. 22s; no genetic testing  . Leukemia Grandchild 21  . Skin cancer Grandchild   . Cervical cancer Cousin 33    maternal 1st cousin  . Alzheimer's disease Paternal Aunt     (x4) paternal aunts  . Heart attack Paternal Uncle 62  . Colon polyps Neg Hx   . Rectal cancer Neg Hx   . Stomach cancer Neg Hx      Review of Systems  Constitutional: Positive for malaise/fatigue and weight loss.  HENT: Negative.   Eyes: Negative.   Respiratory: Negative.   Cardiovascular: Negative.   Gastrointestinal: Positive for nausea.  Genitourinary: Positive for frequency.  Musculoskeletal: Positive for back pain, joint pain and myalgias.  Skin: Positive for rash.  Neurological: Negative.   Endo/Heme/Allergies: Negative.   Psychiatric/Behavioral: Negative.     Objective:  Physical Exam  Vitals reviewed. Constitutional: She is oriented to person, place, and time. She appears well-developed and well-nourished.  HENT:  Head: Normocephalic and atraumatic.  Eyes: Conjunctivae and EOM are normal. Pupils are equal, round,  and reactive to light.  Neck: Normal range of motion. Neck supple.  Cardiovascular: Normal rate, regular rhythm and intact distal pulses.   Respiratory: Effort normal. No respiratory distress.  GI: Soft. She exhibits no distension.  Genitourinary:  Genitourinary Comments: deferred  Musculoskeletal:       Left hip: She exhibits decreased range of motion and bony tenderness.  Neurological: She is alert and oriented to person, place, and time. She has normal reflexes.  Skin: Skin is warm and dry.  Psychiatric: She has a normal mood and affect. Her behavior is normal. Judgment and thought  content normal.    Vital signs in last 24 hours: '@VSRANGES'$ @  Labs:   Estimated body mass index is 33.48 kg/m as calculated from the following:   Height as of 10/29/16: '5\' 3"'$  (1.6 m).   Weight as of 10/29/16: 85.7 kg (189 lb).   Imaging Review Plain radiographs demonstrate severe degenerative joint disease of the left hip(s). The bone quality appears to be adequate for age and reported activity level.  Assessment/Plan:  End stage arthritis, left hip(s)  The patient history, physical examination, clinical judgement of the provider and imaging studies are consistent with end stage degenerative joint disease of the left hip(s) and total hip arthroplasty is deemed medically necessary. The treatment options including medical management, injection therapy, arthroscopy and arthroplasty were discussed at length. The risks and benefits of total hip arthroplasty were presented and reviewed. The risks due to aseptic loosening, infection, stiffness, dislocation/subluxation,  thromboembolic complications and other imponderables were discussed.  The patient acknowledged the explanation, agreed to proceed with the plan and consent was signed. Patient is being admitted for inpatient treatment for surgery, pain control, PT, OT, prophylactic antibiotics, VTE prophylaxis, progressive ambulation and ADL's and discharge  planning.The patient is planning to be discharged home with HEP

## 2016-11-12 ENCOUNTER — Encounter (HOSPITAL_COMMUNITY): Payer: Self-pay | Admitting: Orthopedic Surgery

## 2016-11-12 LAB — CBC
HCT: 35.9 % — ABNORMAL LOW (ref 36.0–46.0)
Hemoglobin: 11.7 g/dL — ABNORMAL LOW (ref 12.0–15.0)
MCH: 31.7 pg (ref 26.0–34.0)
MCHC: 32.6 g/dL (ref 30.0–36.0)
MCV: 97.3 fL (ref 78.0–100.0)
PLATELETS: 194 10*3/uL (ref 150–400)
RBC: 3.69 MIL/uL — ABNORMAL LOW (ref 3.87–5.11)
RDW: 13.1 % (ref 11.5–15.5)
WBC: 15 10*3/uL — ABNORMAL HIGH (ref 4.0–10.5)

## 2016-11-12 LAB — BASIC METABOLIC PANEL
Anion gap: 12 (ref 5–15)
BUN: 16 mg/dL (ref 6–20)
CO2: 24 mmol/L (ref 22–32)
CREATININE: 0.81 mg/dL (ref 0.44–1.00)
Calcium: 8.7 mg/dL — ABNORMAL LOW (ref 8.9–10.3)
Chloride: 101 mmol/L (ref 101–111)
GFR calc Af Amer: >60 mL/min (ref >60)
GLUCOSE: 208 mg/dL — AB (ref 65–99)
POTASSIUM: 3.9 mmol/L (ref 3.5–5.1)
Sodium: 137 mmol/L (ref 135–145)

## 2016-11-12 MED ORDER — SENNA 8.6 MG PO TABS
2.0000 | ORAL_TABLET | Freq: Every day | ORAL | 0 refills | Status: DC
Start: 2016-11-12 — End: 2017-10-13

## 2016-11-12 MED ORDER — ONDANSETRON HCL 4 MG PO TABS: 4.0000 mg | ORAL_TABLET | Freq: Four times a day (QID) | ORAL | 0 refills | Status: DC | PRN

## 2016-11-12 MED ORDER — HYDROCODONE-ACETAMINOPHEN 5-325 MG PO TABS: 1.0000 | ORAL_TABLET | ORAL | 0 refills | Status: DC | PRN

## 2016-11-12 MED ORDER — ASPIRIN 81 MG PO CHEW: 81.0000 mg | CHEWABLE_TABLET | Freq: Two times a day (BID) | ORAL | 1 refills | Status: DC

## 2016-11-12 MED ORDER — DOCUSATE SODIUM 100 MG PO CAPS: 100.0000 mg | ORAL_CAPSULE | Freq: Two times a day (BID) | ORAL | 1 refills | Status: DC

## 2016-11-12 NOTE — Discharge Summary (Signed)
Physician Discharge Summary  Patient ID: Connie Lang MRN: [id number] DOB/AGE: 77-Nov-1942 77 y.o.  Admit date: 11/11/2016 Discharge date: 11/12/2016  Admission Diagnoses:  Osteoarthritis of left hip  Discharge Diagnoses:  Principal Problem:   Osteoarthritis of left hip   Past Medical History:  Diagnosis Date  . Allergy    occasionally has hayfever  . Anxiety   . Breast cancer ([place]) 2017  . Cancer ([place]) 1987   cancerous cells in vaginal wall seemed to be metastatic from the gut but no primary was ever found   . Chicken pox   . Constipation   . Depression   . DJD (degenerative joint disease)   . Dysrhythmia   . GERD (gastroesophageal reflux disease)   . Glaucoma    glaucoma and cataracts sees Dr Satira Sark   . Hyperlipemia   . Hypertension    pt denies currently. " Prednisone made it high."  . Lumbar disc disease    sees Dr. Sherwood Gambler   . Osteoarthritis   . Osteopenia   . PMR (polymyalgia rheumatica) (HCC)    sees Dr. Leigh Aurora   . Radiation    02-25-16 last radiation for breast cancer  . Transfusion history    blood     Surgeries: Procedure(s): LEFT TOTAL HIP ARTHROPLASTY ANTERIOR APPROACH on 11/11/2016   Consultants (if any):   Discharged Condition: Improved  Hospital Course: Connie Lang is an 76 y.o. female who was admitted 11/11/2016 with a diagnosis of Osteoarthritis of left hip and went to the operating room on 11/11/2016 and underwent the above named procedures.    She was given perioperative antibiotics:  Anti-infectives    Start     Dose/Rate Route Frequency Ordered Stop   11/12/16 0600  ceFAZolin (ANCEF) IVPB 2g/100 mL premix     2 g 200 mL/hr over 30 Minutes Intravenous On call to O.R. 11/11/16 1328 11/12/16 0635   11/11/16 2000  ceFAZolin (ANCEF) IVPB 2g/100 mL premix     2 g 200 mL/hr over 30 Minutes Intravenous Every 6 hours 11/11/16 1847 11/12/16 0302    .  She was given sequential compression devices, early ambulation, and ASA  for DVT prophylaxis.  She benefited maximally from the hospital stay and there were no complications.    Recent vital signs:  Vitals:   11/12/16 0241 11/12/16 0603  BP: 118/74 107/65  Pulse: 88 90  Resp:  16  Temp: 98.1 F (36.7 C) 97.6 F (36.4 C)    Recent laboratory studies:  Lab Results  Component Value Date   HGB 11.7 (L) 11/12/2016   HGB 14.2 10/11/2016   HGB 10.5 (L) 05/14/2016   Lab Results  Component Value Date   WBC 15.0 (H) 11/12/2016   PLT 194 11/12/2016   No results found for: INR Lab Results  Component Value Date   NA 137 11/12/2016   K 3.9 11/12/2016   CL 101 11/12/2016   CO2 24 11/12/2016   BUN 16 11/12/2016   CREATININE 0.81 11/12/2016   GLUCOSE 208 (H) 11/12/2016    Discharge Medications:   Allergies as of 11/12/2016   No Known Allergies     Medication List    STOP taking these medications   acetaminophen 650 MG CR tablet Commonly known as:  TYLENOL   nabumetone 500 MG tablet Commonly known as:  RELAFEN     TAKE these medications   amLODipine 5 MG tablet Commonly known as:  NORVASC Take 5 mg by mouth every evening.  aspirin 81 MG chewable tablet Chew 1 tablet (81 mg total) by mouth 2 (two) times daily. What changed:  when to take this   CALCIUM 600+D 600-800 MG-UNIT Tabs Generic drug:  Calcium Carb-Cholecalciferol Take 1 tablet by mouth 2 (two) times daily.   chlorpheniramine 4 MG tablet Commonly known as:  CHLOR-TRIMETON Take 4 mg by mouth daily as needed for allergies.   JQBHALPFX-90 EX Apply 1 application topically 3 (three) times daily as needed (for ezcema/itchy skin).   cyclobenzaprine 10 MG tablet Commonly known as:  FLEXERIL TAKE ONE TABLET BY MOUTH THREE TIMES DAILY AS NEEDED FOR MUSCLE SPASM   diphenhydrAMINE 12.5 MG chewable tablet Commonly known as:  BENADRYL Chew 12.5 mg by mouth 2 (two) times daily as needed (for allergic reaction (lip swelling)).   diphenhydrAMINE-zinc acetate cream Commonly known as:   BENADRYL Apply 1 application topically 2 (two) times daily as needed for itching.   docusate sodium 100 MG capsule Commonly known as:  COLACE Take 1 capsule (100 mg total) by mouth 2 (two) times daily.   dorzolamide 2 % ophthalmic solution Commonly known as:  TRUSOPT Place 1 drop into both eyes 2 (two) times daily.   furosemide 40 MG tablet Commonly known as:  LASIX TAKE ONE TABLET BY MOUTH TWICE DAILY   HYDROcodone-acetaminophen 5-325 MG tablet Commonly known as:  NORCO/VICODIN Take 1-2 tablets by mouth every 4 (four) hours as needed (breakthrough pain).   ibuprofen 200 MG tablet Commonly known as:  ADVIL,MOTRIN Take 400 mg by mouth every 4 (four) hours as needed (for pain).   LINZESS 72 MCG capsule Generic drug:  linaclotide TAKE ONE CAPSULE BY MOUTH ONCE DAILY BEFORE BREAKFAST   loratadine 10 MG tablet Commonly known as:  CLARITIN Take 10 mg by mouth daily at 12 noon.   losartan 50 MG tablet Commonly known as:  COZAAR Take 1 tablet (50 mg total) by mouth daily.   MUCUS RELIEF DM COUGH 20-400 MG Tabs Generic drug:  Dextromethorphan-Guaifenesin Take 1-2 tablets by mouth 2 (two) times daily as needed (for cough/congestion).   multivitamin with minerals Tabs tablet Take 1 tablet by mouth daily at 3 pm. One-A-Day 65+   Omega 3 1200 MG Caps Take 1,200 mg by mouth daily at 3 pm.   ondansetron 4 MG tablet Commonly known as:  ZOFRAN Take 1 tablet (4 mg total) by mouth every 6 (six) hours as needed for nausea.   oxymetazoline 0.05 % nasal spray Commonly known as:  AFRIN Place 1 spray into both nostrils 2 (two) times daily as needed for congestion.   pantoprazole 40 MG tablet Commonly known as:  PROTONIX Take 1 tablet (40 mg total) by mouth daily.   potassium chloride 10 MEQ tablet Commonly known as:  KLOR-CON 10 Take 2 tablets by mouth daily What changed:  how much to take  how to take this  when to take this  additional instructions   PRESERVISION AREDS  2 PO Take 1 tablet by mouth 2 (two) times daily.   senna 8.6 MG Tabs tablet Commonly known as:  SENOKOT Take 2 tablets (17.2 mg total) by mouth at bedtime.   simvastatin 20 MG tablet Commonly known as:  ZOCOR TAKE ONE TABLET BY MOUTH AT BEDTIME   SYSTANE BALANCE 0.6 % Soln Generic drug:  Propylene Glycol Place 1-2 drops into both eyes 4 (four) times daily as needed (for dry eyes).   TRAVATAN Z 0.004 % Soln ophthalmic solution Generic drug:  Travoprost (BAK Free) Place  1 drop into both eyes at bedtime.   TUSSIN CF PO Take 10 mLs by mouth every 4 (four) hours as needed (for post nasal drip.).   vitamin B-12 1000 MCG tablet Commonly known as:  CYANOCOBALAMIN Take 2,000 mcg by mouth daily at 3 pm.       Diagnostic Studies: Dg Pelvis Portable  Result Date: 11/11/2016 CLINICAL DATA:  Status post hip arthroplasty. EXAM: PORTABLE PELVIS 1-2 VIEWS COMPARISON:  05/13/2016. FINDINGS: The patient is status post LEFT total hip arthroplasty. IMPRESSION: Satisfactory position and alignment. Electronically Signed   By: Staci Righter M.D.   On: 11/11/2016 18:41   Dg C-arm 1-60 Min-no Report  Result Date: 11/11/2016 Fluoroscopy was utilized by the requesting physician.  No radiographic interpretation.   Dg Hip Operative Unilat With Pelvis Left  Result Date: 11/11/2016 CLINICAL DATA:  Left hip replacement, anterior approach. EXAM: OPERATIVE LEFT HIP (WITH PELVIS IF PERFORMED) 2 VIEWS TECHNIQUE: Fluoroscopic spot image(s) were submitted for interpretation post-operatively. COMPARISON:  None. FINDINGS: Two fluoroscopic spot images post total hip arthroplasty. Left hip arthroplasty in expected alignment. Second image demonstrates bilateral hip arthroplasties, partially included. Total fluoroscopy time 21 seconds. 3.71 mGy exposure. IMPRESSION: Post procedural fluoroscopy after left hip arthroplasty. Electronically Signed   By: Jeb Levering M.D.   On: 11/11/2016 17:31    Disposition:  06-Home-Health Care Svc  Discharge Instructions    Call MD / Call 911    Complete by:  As directed    If you experience chest pain or shortness of breath, CALL 911 and be transported to the hospital emergency room.  If you develope a fever above 101 F, pus (white drainage) or increased drainage or redness at the wound, or calf pain, call your surgeon's office.   Constipation Prevention    Complete by:  As directed    Drink plenty of fluids.  Prune juice may be helpful.  You may use a stool softener, such as Colace (over the counter) 100 mg twice a day.  Use MiraLax (over the counter) for constipation as needed.   Diet - low sodium heart healthy    Complete by:  As directed    Driving restrictions    Complete by:  As directed    No driving for 2 weeks   Increase activity slowly as tolerated    Complete by:  As directed    Lifting restrictions    Complete by:  As directed    No lifting for 2 weeks   TED hose    Complete by:  As directed    Use stockings (TED hose) for 2 weeks on both leg(s).  You may remove them at night for sleeping.      Follow-up Information    Akai Dollard, Aaron Edelman, MD. Schedule an appointment as soon as possible for a visit in 2 weeks.   Specialty:  Orthopedic Surgery Why:  For wound re-check Contact information: [address]. [address] [address] [phone]            Signed: Elie Goody 11/12/2016, 9:10 AM

## 2016-11-12 NOTE — Op Note (Signed)
OPERATIVE REPORT  SURGEON: Rod Can, MD   ASSISTANT: Staff.  PREOPERATIVE DIAGNOSIS: Left hip arthritis.   POSTOPERATIVE DIAGNOSIS: Left hip arthritis.   PROCEDURE: Left total hip arthroplasty, anterior approach.   IMPLANTS: DePuy Tri Lock stem, size 4, hi offset. DePuy Pinnacle Cup, size 52 mm. DePuy Altrx liner, size 36 by 52 mm, +4 neutral. DePuy Biolox ceramic head ball, size 36 + 1.5 mm.  ANESTHESIA:  Spinal  ESTIMATED BLOOD LOSS: 350 mL.   ANTIBIOTICS: 2 g Ancef.  DRAINS: None.  COMPLICATIONS: None.   CONDITION: PACU - hemodynamically stable.   BRIEF CLINICAL NOTE: Connie Lang is a 76 y.o. female with a long-standing history of Left hip arthritis. After failing conservative management, the patient was indicated for total hip arthroplasty. The risks, benefits, and alternatives to the procedure were explained, and the patient elected to proceed.  PROCEDURE IN DETAIL: Surgical site was marked by myself in the pre-op holding area. Once inside the operating room, spinal anesthesia was obtained, and a foley catheter was inserted. The patient was then positioned on the Hana table. All bony prominences were well padded. The hip was prepped and draped in the normal sterile surgical fashion. A time-out was called verifying side and site of surgery. The patient received IV antibiotics within 60 minutes of beginning the procedure.  The direct anterior approach to the hip was performed through the Hueter interval. Lateral femoral circumflex vessels were treated with the Auqumantys. The anterior capsule was exposed and an inverted T capsulotomy was made.The femoral neck cut was made to the level of the templated cut. A corkscrew was placed into the head and the head was removed. The femoral head was found to have eburnated bone. The head was passed to the back table and was measured.  Acetabular exposure was achieved, and the pulvinar and labrum were excised.  Sequental reaming of the acetabulum was then performed up to a size 51 mm reamer. A 52 mm cup was then opened and impacted into place at approximately 40 degrees of abduction and 20 degrees of anteversion. The final polyethylene liner was impacted into place and acetabular osteophytes were removed.   I then gained femoral exposure taking care to protect the abductors and greater trochanter. This was performed using standard external rotation, extension, and adduction. The capsule was peeled off the inner aspect of the greater trochanter, taking care to preserve the short external rotators. A cookie cutter was used to enter the femoral canal, and then the femoral canal finder was placed. Sequential broaching was performed up to a size 4. Calcar planer was used on the femoral neck remnant. I placed a hi offset neck and a trial head ball. The hip was reduced. Leg lengths and offset were checked fluoroscopically. The hip was dislocated and trial components were removed. The final implants were placed, and the hip was reduced.  Fluoroscopy was used to confirm component position and leg lengths. At 90 degrees of external rotation and full extension, the hip was stable to an anterior directed force.  The wound was copiously irrigated with normal saline using pulse lavage. Marcaine solution was injected into the periarticular soft tissue. The wound was closed in layers using #1 Vicryl and V-Loc for the fascia, 2-0 Vicryl for the subcutaneous fat, 2-0 Monocryl for the deep dermal layer, 3-0 running Monocryl subcuticular stitch, and Dermabond for the skin. Once the glue was fully dried, an Aquacell Ag dressing was applied. The patient was transported to the recovery room in  stable condition. Sponge, needle, and instrument counts were correct at the end of the case x2. The patient tolerated the procedure well and there were no known complications.

## 2016-11-12 NOTE — Progress Notes (Signed)
Physical Therapy Treatment Patient Details Name: Connie Lang MRN: [id number] DOB: 1941-05-17 Today's Date: 11/12/2016    History of Present Illness Pt s/p L THR and with hx of R THR , breast CA and polymyalgia Rheumatica    PT Comments    Pt progressing with mobility and eager for dc home this date but ltd by onset nausea.  Reviewed stairs and car transfers.  Pt states comfortable with bed mobility and therex program at home.  Follow Up Recommendations  No PT follow up     Equipment Recommendations  None recommended by PT    Recommendations for Other Services       Precautions / Restrictions Precautions Precautions: Fall Restrictions Weight Bearing Restrictions: No Other Position/Activity Restrictions: WBAT    Mobility  Bed Mobility Overal bed mobility: Needs Assistance Bed Mobility: Supine to Sit     Supine to sit: Min assist     General bed mobility comments: Pt declines to attempt 2* nausea  Transfers Overall transfer level: Needs assistance Equipment used: Rolling walker (2 wheeled) Transfers: Sit to/from Stand Sit to Stand: Min guard;Supervision         General transfer comment: cues for LE management and use of UEs to self assist  Ambulation/Gait Ambulation/Gait assistance: Min guard;Supervision Ambulation Distance (Feet): 150 Feet (and 15' into bathroom) Assistive device: Rolling walker (2 wheeled) Gait Pattern/deviations: Step-to pattern;Step-through pattern;Decreased step length - right;Decreased step length - left;Shuffle;Trunk flexed Gait velocity: decr Gait velocity interpretation: Below normal speed for age/gender General Gait Details: min cues for sequence, posture and position from RW   Stairs Stairs: Yes   Stair Management: No rails;One rail Right;Step to pattern;Forwards;With cane;With walker Number of Stairs: 3 General stair comments: single step fwd with RW and 2 steps fwd with cane and rail;  Cues for sequence and foot/cane  placement.  Ltd by onset nausea.  Wheelchair Mobility    Modified Rankin (Stroke Patients Only)       Balance                                            Cognition Arousal/Alertness: Awake/alert Behavior During Therapy: WFL for tasks assessed/performed Overall Cognitive Status: Within Functional Limits for tasks assessed                                        Exercises Total Joint Exercises Ankle Circles/Pumps: AROM;Both;15 reps;Supine Quad Sets: AROM;Both;10 reps;Supine Heel Slides: AAROM;Left;20 reps;Supine Hip ABduction/ADduction: AAROM;Left;15 reps;Supine    General Comments        Pertinent Vitals/Pain Pain Assessment: 0-10 Pain Score: 5  Pain Location: L hip Pain Descriptors / Indicators: Aching;Sore Pain Intervention(s): Limited activity within patient's tolerance;Monitored during session;Premedicated before session    [place] expects to be discharged to:: Private residence Living Arrangements: Spouse/significant other Available Help at Discharge: Family Type of Home: House Home Access: Stairs to enter   Home Layout: Two level Home Equipment: Environmental consultant - 4 wheels;Bedside commode;Shower seat Additional Comments: has all DME; has had pain for a long time    Prior Function Level of Independence: Independent;Independent with assistive device(s)          PT Goals (current goals can now be found in the care plan section) Acute Rehab PT Goals Patient Stated Goal: Regain  IND PT Goal Formulation: With patient Time For Goal Achievement: 11/13/16 Potential to Achieve Goals: Good Progress towards PT goals: Progressing toward goals    Frequency    7X/week      PT Plan Current plan remains appropriate    Co-evaluation              AM-PAC PT "6 Clicks" Daily Activity  Outcome Measure  Difficulty turning over in bed (including adjusting bedclothes, sheets and blankets)?: A Little Difficulty  moving from lying on back to sitting on the side of the bed? : A Little Difficulty sitting down on and standing up from a chair with arms (e.g., wheelchair, bedside commode, etc,.)?: A Little Help needed moving to and from a bed to chair (including a wheelchair)?: A Little Help needed walking in hospital room?: A Little Help needed climbing 3-5 steps with a railing? : A Little 6 Click Score: 18    End of Session Equipment Utilized During Treatment: Gait belt Activity Tolerance: Patient tolerated treatment well Patient left: in chair;with call bell/phone within reach;with family/visitor present Nurse Communication: Mobility status PT Visit Diagnosis: Difficulty in walking, not elsewhere classified (R26.2)     Time: 6815-9470 PT Time Calculation (min) (ACUTE ONLY): 33 min  Charges:  $Gait Training: 8-22 mins $Therapeutic Exercise: 8-22 mins $Therapeutic Activity: 8-22 mins                    G Codes:       Pg [phone]     Naya Ilagan 11/12/2016, 4:12 PM

## 2016-11-12 NOTE — Progress Notes (Signed)
   Subjective:  Patient reports pain as mild to moderate.  Had pain control issues o/n - better now. Denies N/V/CP/SOB.  Objective:   VITALS:   Vitals:   11/11/16 2117 11/11/16 2240 11/12/16 0241 11/12/16 0603  BP: (!) 143/44 (!) 143/50 118/74 107/65  Pulse: 79 90 88 90  Resp: '16 16  16  '$ Temp: 97.7 F (36.5 C) 98.8 F (37.1 C) 98.1 F (36.7 C) 97.6 F (36.4 C)  TempSrc: Oral Oral Oral Oral  SpO2: 92% 92%  95%  Weight:      Height:        NAD ABD soft Sensation intact distally Intact pulses distally Dorsiflexion/Plantar flexion intact Incision: dressing C/D/I Compartment soft   Lab Results  Component Value Date   WBC 15.0 (H) 11/12/2016   HGB 11.7 (L) 11/12/2016   HCT 35.9 (L) 11/12/2016   MCV 97.3 11/12/2016   PLT 194 11/12/2016   BMET    Component Value Date/Time   NA 137 11/12/2016 0441   NA 141 12/10/2015 1237   K 3.9 11/12/2016 0441   K 3.9 12/10/2015 1237   CL 101 11/12/2016 0441   CO2 24 11/12/2016 0441   CO2 28 12/10/2015 1237   GLUCOSE 208 (H) 11/12/2016 0441   GLUCOSE 121 12/10/2015 1237   BUN 16 11/12/2016 0441   BUN 17.3 12/10/2015 1237   CREATININE 0.81 11/12/2016 0441   CREATININE 1.0 12/10/2015 1237   CALCIUM 8.7 (L) 11/12/2016 0441   CALCIUM 10.4 12/10/2015 1237   GFRNONAA >60 11/12/2016 0441   GFRAA >60 11/12/2016 0441     Assessment/Plan: 1 Day Post-Op   Principal Problem:   Osteoarthritis of left hip   WBAT with walker DVT ppx: ASA, SCDs, TEDs PT/OT PO pain control Dispo: D/C home with HEP   Aneka Fagerstrom, Horald Pollen 11/12/2016, 9:06 AM   Rod Can, MD Cell [phone]

## 2016-11-12 NOTE — Evaluation (Signed)
Physical Therapy Evaluation Patient Details Name: Connie Lang MRN: [id number] DOB: 1941-02-07 Today's Date: 11/12/2016   History of Present Illness  Pt s/p L THR and with hx of R THR , breast CA and polymyalgia Rheumatica  Clinical Impression  Pt s/p L THR and presents with decreased L LE strength/ROM and post op pain limiting functional mobility.  Pt should progress to dc home with family assist..    Follow Up Recommendations No PT follow up    Equipment Recommendations  None recommended by PT    Recommendations for Other Services       Precautions / Restrictions Precautions Precautions: Fall Restrictions Weight Bearing Restrictions: No Other Position/Activity Restrictions: WBAT      Mobility  Bed Mobility Overal bed mobility: Needs Assistance Bed Mobility: Supine to Sit     Supine to sit: Min assist     General bed mobility comments: cues for sequence with min assist to bring trunk to upright  Transfers Overall transfer level: Needs assistance Equipment used: Rolling walker (2 wheeled) Transfers: Sit to/from Stand Sit to Stand: Min assist;Min guard         General transfer comment: cues for LE management and use of UEs to self assist  Ambulation/Gait Ambulation/Gait assistance: Min assist;Min guard Ambulation Distance (Feet): 300 Feet Assistive device: Rolling walker (2 wheeled) Gait Pattern/deviations: Step-to pattern;Step-through pattern;Decreased step length - right;Decreased step length - left;Shuffle;Trunk flexed Gait velocity: decr Gait velocity interpretation: Below normal speed for age/gender General Gait Details: cues for sequence, posture and position from [employer]            Wheelchair Mobility    Modified Rankin (Stroke Patients Only)       Balance                                             Pertinent Vitals/Pain Pain Assessment: 0-10 Pain Score: 5  Pain Location: L hip Pain Descriptors /  Indicators: Aching;Sore Pain Intervention(s): Limited activity within patient's tolerance;Monitored during session;Premedicated before session;Ice applied    Home Living Family/patient expects to be discharged to:: Private residence Living Arrangements: Spouse/significant other Available Help at Discharge: Family Type of Home: House Home Access: Stairs to enter   Technical brewer of Steps: 1 Home Layout: Two level Home Equipment: Environmental consultant - 4 wheels;Bedside commode;Shower seat Additional Comments: has all DME; has had pain for a long time    Prior Function Level of Independence: Independent;Independent with assistive device(s)               Hand Dominance        Extremity/Trunk Assessment   Upper Extremity Assessment Upper Extremity Assessment: Overall WFL for tasks assessed    Lower Extremity Assessment Lower Extremity Assessment: LLE deficits/detail LLE Deficits / Details: strength at hip 2+/5 with AAROM at hip to 80 flex and 20 abd       Communication   Communication: No difficulties  Cognition Arousal/Alertness: Awake/alert Behavior During Therapy: WFL for tasks assessed/performed Overall Cognitive Status: Within Functional Limits for tasks assessed                                        General Comments      Exercises Total Joint Exercises Ankle Circles/Pumps: AROM;Both;15 reps;Supine Quad Sets: AROM;Both;10  reps;Supine Heel Slides: AAROM;Left;20 reps;Supine Hip ABduction/ADduction: AAROM;Left;15 reps;Supine   Assessment/Plan    PT Assessment Patient needs continued PT services  PT Problem List Decreased strength;Decreased range of motion;Decreased activity tolerance;Decreased mobility;Decreased knowledge of use of DME;Pain       PT Treatment Interventions DME instruction;Gait training;Stair training;Functional mobility training;Therapeutic exercise;Therapeutic activities;Patient/family education    PT Goals (Current goals  can be found in the Care Plan section)  Acute Rehab PT Goals Patient Stated Goal: Regain IND PT Goal Formulation: With patient Time For Goal Achievement: 11/13/16 Potential to Achieve Goals: Good    Frequency 7X/week   Barriers to discharge        Co-evaluation               AM-PAC PT "6 Clicks" Daily Activity  Outcome Measure Difficulty turning over in bed (including adjusting bedclothes, sheets and blankets)?: A Little Difficulty moving from lying on back to sitting on the side of the bed? : A Little Difficulty sitting down on and standing up from a chair with arms (e.g., wheelchair, bedside commode, etc,.)?: A Little Help needed moving to and from a bed to chair (including a wheelchair)?: A Little Help needed walking in hospital room?: A Little Help needed climbing 3-5 steps with a railing? : A Little 6 Click Score: 18    End of Session Equipment Utilized During Treatment: Gait belt Activity Tolerance: Patient tolerated treatment well Patient left: in chair;with call bell/phone within reach;with family/visitor present Nurse Communication: Mobility status PT Visit Diagnosis: Difficulty in walking, not elsewhere classified (R26.2)    Time: 2122-4825 PT Time Calculation (min) (ACUTE ONLY): 38 min   Charges:   PT Evaluation $PT Eval Low Complexity: 1 Procedure PT Treatments $Gait Training: 8-22 mins $Therapeutic Exercise: 8-22 mins   PT G Codes:        Pg [phone]    Torri Michalski 11/12/2016, 4:04 PM

## 2016-11-22 ENCOUNTER — Other Ambulatory Visit: Payer: Self-pay | Admitting: Family Medicine

## 2016-11-26 DIAGNOSIS — Z471 Aftercare following joint replacement surgery: Secondary | ICD-10-CM | POA: Diagnosis not present

## 2016-11-26 DIAGNOSIS — Z96642 Presence of left artificial hip joint: Secondary | ICD-10-CM | POA: Diagnosis not present

## 2016-12-08 DIAGNOSIS — H401131 Primary open-angle glaucoma, bilateral, mild stage: Secondary | ICD-10-CM | POA: Diagnosis not present

## 2016-12-09 ENCOUNTER — Other Ambulatory Visit: Payer: Self-pay | Admitting: Family Medicine

## 2016-12-09 DIAGNOSIS — Z853 Personal history of malignant neoplasm of breast: Secondary | ICD-10-CM

## 2016-12-16 ENCOUNTER — Other Ambulatory Visit: Payer: Self-pay | Admitting: Family Medicine

## 2016-12-16 ENCOUNTER — Ambulatory Visit
Admission: RE | Admit: 2016-12-16 | Discharge: 2016-12-16 | Disposition: A | Discharge status: Home / Self Care | Payer: Medicare Other | Source: Ambulatory Visit | Attending: Family Medicine | Admitting: Family Medicine

## 2016-12-16 DIAGNOSIS — Z853 Personal history of malignant neoplasm of breast: Secondary | ICD-10-CM

## 2016-12-16 DIAGNOSIS — R921 Mammographic calcification found on diagnostic imaging of breast: Secondary | ICD-10-CM

## 2016-12-16 DIAGNOSIS — R928 Other abnormal and inconclusive findings on diagnostic imaging of breast: Secondary | ICD-10-CM | POA: Diagnosis not present

## 2016-12-16 HISTORY — DX: Personal history of irradiation: Z92.3

## 2016-12-22 DIAGNOSIS — Z471 Aftercare following joint replacement surgery: Secondary | ICD-10-CM | POA: Diagnosis not present

## 2016-12-22 DIAGNOSIS — Z96642 Presence of left artificial hip joint: Secondary | ICD-10-CM | POA: Diagnosis not present

## 2017-01-04 ENCOUNTER — Other Ambulatory Visit: Payer: Self-pay | Admitting: Family Medicine

## 2017-02-10 DIAGNOSIS — D0511 Intraductal carcinoma in situ of right breast: Secondary | ICD-10-CM | POA: Diagnosis not present

## 2017-02-24 ENCOUNTER — Encounter (HOSPITAL_COMMUNITY): Payer: Self-pay | Admitting: Orthopedic Surgery

## 2017-02-24 NOTE — Addendum Note (Signed)
Addendum  created 02/24/17 1421 by Roberts Gaudy, MD   Sign clinical note

## 2017-03-02 ENCOUNTER — Other Ambulatory Visit: Payer: Self-pay | Admitting: Family Medicine

## 2017-03-22 ENCOUNTER — Ambulatory Visit (INDEPENDENT_AMBULATORY_CARE_PROVIDER_SITE_OTHER): Payer: Medicare Other | Admitting: *Deleted

## 2017-03-22 DIAGNOSIS — Z23 Encounter for immunization: Secondary | ICD-10-CM | POA: Diagnosis not present

## 2017-03-24 ENCOUNTER — Encounter: Payer: Self-pay | Admitting: Family Medicine

## 2017-03-31 ENCOUNTER — Other Ambulatory Visit: Payer: Self-pay | Admitting: Family Medicine

## 2017-04-08 ENCOUNTER — Encounter: Payer: Self-pay | Admitting: Gastroenterology

## 2017-04-12 ENCOUNTER — Telehealth: Payer: Self-pay | Admitting: Gastroenterology

## 2017-04-12 NOTE — Telephone Encounter (Signed)
Connie Lang tells me that she is doing quite well with her Colon and feels she does not need one this year. I told her I would notate her chart and to call back if she changes her mind or has any gi problems or concerns.

## 2017-04-28 ENCOUNTER — Ambulatory Visit (INDEPENDENT_AMBULATORY_CARE_PROVIDER_SITE_OTHER): Payer: Medicare Other | Admitting: Cardiology

## 2017-04-28 ENCOUNTER — Encounter: Payer: Self-pay | Admitting: Cardiology

## 2017-04-28 VITALS — BP 128/86 | HR 104 | Ht 64.0 in | Wt 197.1 lb

## 2017-04-28 DIAGNOSIS — I441 Atrioventricular block, second degree: Secondary | ICD-10-CM

## 2017-04-28 DIAGNOSIS — I447 Left bundle-branch block, unspecified: Secondary | ICD-10-CM

## 2017-04-28 NOTE — Patient Instructions (Signed)
Medication Instructions:  The current medical regimen is effective;  continue present plan and medications.  Follow-Up: Follow up as needed with Dr Skains.  Thank you for choosing [facility]!!     

## 2017-04-28 NOTE — Progress Notes (Signed)
Cardiology Office Note:    Date:  04/28/2017   ID:  Connie Lang, DOB Aug 06, 1940, MRN [id number]  PCP:  Laurey Morale, MD  Cardiologist:  Candee Furbish, MD  Referring MD: Laurey Morale, MD     History of Present Illness:    Connie Lang is a 76 y.o. female here for follow-up of second-degree heart block type I.  Overall she has been doing quite well.  No syncope, no chest pain.  Prior to her previous hip surgery by Dr. Lillia Corporal she was found to have a slightly irregular heart rhythm which was consistent with a second-degree heart block type I.  Usually benign.   She has a second-degree heart block type I on EKG from 10/11/16 with left bundle branch block/interventricular conduction delay.  Prior EKG on 10/02/15 shows first-degree AV block with narrow QRS complex.  Prior stress test on 06/12/15 did not show any ischemia.  Normal ejection fraction previously.  Prior radiation to right breast (cancer)    Past Medical History:  Diagnosis Date  . Allergy    occasionally has hayfever  . Anxiety   . Breast cancer ([place]) 2017  . Cancer ([place]) 1987   cancerous cells in vaginal wall seemed to be metastatic from the gut but no primary was ever found   . Chicken pox   . Constipation   . Depression   . DJD (degenerative joint disease)   . Dysrhythmia   . GERD (gastroesophageal reflux disease)   . Glaucoma    glaucoma and cataracts sees Dr Satira Sark   . Hyperlipemia   . Hypertension    pt denies currently. " Prednisone made it high."  . Lumbar disc disease    sees Dr. Sherwood Gambler   . Osteoarthritis   . Osteopenia   . Personal history of radiation therapy   . PMR (polymyalgia rheumatica) (H[facility])    sees Dr. Leigh Aurora   . Radiation    02-25-16 last radiation for breast cancer  . Transfusion history    blood     Past Surgical History:  Procedure Laterality Date  . ABDOMINAL HYSTERECTOMY     TAH BSO 1985  . APPENDECTOMY    . BASAL CELL CARCINOMA EXCISION     nose and rt thigh  . BREAST BIOPSY      x 4  . BREAST EXCISIONAL BIOPSY Right 1997  . BREAST EXCISIONAL BIOPSY Left 1985  . BREAST EXCISIONAL BIOPSY Left 1965  . BREAST EXCISIONAL BIOPSY Right 1959  . BREAST LUMPECTOMY Right 12/25/2015  . BREAST LUMPECTOMY WITH RADIOACTIVE SEED LOCALIZATION Right 12/25/2015   Procedure: RIGHT BREAST LUMPECTOMY WITH RADIOACTIVE SEED LOCALIZATION;  Surgeon: Rolm Bookbinder, MD;  Location: [facility];  Service: General;  Laterality: Right;  . CATARACT EXTRACTION     01-27-2011 left, 02-17-2011 right   . CERVICAL LAMINECTOMY     1983  . cervical microdiskectomy     1990 Dr Jovita Gamma  . CESAREAN SECTION    . COLONOSCOPY  03/19/2016   per Dr. Silverio Decamp, adenomatous polyps, repeat in one year   . EYE SURGERY     bil cataracts with lens implant  . FOOT ARTHRODESIS, MODIFIED MCBRIDE    . LUMBAR LAMINECTOMY     1990 Dr Jovita Gamma  . MYELOGRAM    . POLYPECTOMY    . REFRACTIVE SURGERY     01-29-2009 04-07-2009 Dr Foye Clock in [place] [facility]  . spinal injection     07-30-2012, 09-24-2012  .  TENDON REPAIR     rt wrist  . TOTAL HIP ARTHROPLASTY Right 05/13/2016   Procedure: RIGHT TOTAL HIP ARTHROPLASTY ANTERIOR APPROACH;  Surgeon: Rod Can, MD;  Location: [facility];  Service: Orthopedics;  Laterality: Right;  [place] RNFA  . TOTAL HIP ARTHROPLASTY Left 11/11/2016   Procedure: LEFT TOTAL HIP ARTHROPLASTY ANTERIOR APPROACH;  Surgeon: Rod Can, MD;  Location: [facility];  Service: Orthopedics;  Laterality: Left;  Dr. requesting RNFA  . VAGINAL BIRTH AFTER CESAREAN SECTION     x3    Current Medications: Current Meds  Medication Sig  . amLODipine (NORVASC) 5 MG tablet Take 5 mg by mouth every evening.   Marland Kitchen aspirin 81 MG chewable tablet Chew 1 tablet (81 mg total) by mouth 2 (two) times daily.  . Calcium Carb-Cholecalciferol (CALCIUM 600+D) 600-800 MG-UNIT TABS Take 1 tablet by mouth 2 (two) times daily.  . chlorpheniramine (CHLOR-TRIMETON)  4 MG tablet Take 4 mg by mouth daily as needed for allergies.  . cyclobenzaprine (FLEXERIL) 10 MG tablet TAKE 1 TABLET BY MOUTH THREE TIMES DAILY AS NEEDED FOR MUSCLE SPASM  . Dextromethorphan-Guaifenesin (MUCUS RELIEF DM COUGH) 20-400 MG TABS Take 1-2 tablets by mouth 2 (two) times daily as needed (for cough/congestion).  . diphenhydrAMINE (BENADRYL) 12.5 MG chewable tablet Chew 12.5 mg by mouth 2 (two) times daily as needed (for allergic reaction (lip swelling)).  . diphenhydrAMINE-zinc acetate (BENADRYL) cream Apply 1 application topically 2 (two) times daily as needed for itching.   . docusate sodium (COLACE) 100 MG capsule Take 1 capsule (100 mg total) by mouth 2 (two) times daily.  . dorzolamide (TRUSOPT) 2 % ophthalmic solution Place 1 drop into both eyes 2 (two) times daily.  . furosemide (LASIX) 40 MG tablet TAKE 1 TABLET BY MOUTH TWICE DAILY  . HYDROcodone-acetaminophen (NORCO/VICODIN) 5-325 MG tablet Take 1-2 tablets by mouth every 4 (four) hours as needed (breakthrough pain).  . Hydrocortisone (UDJSHFWYO-37 EX) Apply 1 application topically 3 (three) times daily as needed (for ezcema/itchy skin).  Marland Kitchen ibuprofen (ADVIL,MOTRIN) 200 MG tablet Take 400 mg by mouth every 4 (four) hours as needed (for pain).  Marland Kitchen LINZESS 72 MCG capsule TAKE ONE CAPSULE BY MOUTH ONCE DAILY BEFORE BREAKFAST  . loratadine (CLARITIN) 10 MG tablet Take 10 mg by mouth daily at 12 noon.  Marland Kitchen losartan (COZAAR) 50 MG tablet Take 1 tablet (50 mg total) by mouth daily.  . Multiple Vitamin (MULTIVITAMIN WITH MINERALS) TABS tablet Take 1 tablet by mouth daily at 3 pm. One-A-Day 65+   . Multiple Vitamins-Minerals (PRESERVISION AREDS 2 PO) Take 1 tablet by mouth 2 (two) times daily.  . Omega 3 1200 MG CAPS Take 1,200 mg by mouth daily at 3 pm.  . ondansetron (ZOFRAN) 4 MG tablet Take 1 tablet (4 mg total) by mouth every 6 (six) hours as needed for nausea.  Marland Kitchen oxymetazoline (AFRIN) 0.05 % nasal spray Place 1 spray into both  nostrils 2 (two) times daily as needed for congestion.  . pantoprazole (PROTONIX) 40 MG tablet Take 1 tablet (40 mg total) by mouth daily.  Marland Kitchen Phenylephrine-DM-GG (TUSSIN CF PO) Take 10 mLs by mouth every 4 (four) hours as needed (for post nasal drip.).  Marland Kitchen potassium chloride (K-DUR,KLOR-CON) 10 MEQ tablet Take 10 mEq by mouth 2 (two) times daily.  Marland Kitchen Propylene Glycol (SYSTANE BALANCE) 0.6 % SOLN Place 1-2 drops into both eyes 4 (four) times daily as needed (for dry eyes).  Marland Kitchen senna (SENOKOT) 8.6 MG TABS tablet Take 2  tablets (17.2 mg total) by mouth at bedtime.  . simvastatin (ZOCOR) 20 MG tablet TAKE 1 TABLET BY MOUTH AT BEDTIME  . TRAVATAN Z 0.004 % SOLN ophthalmic solution Place 1 drop into both eyes at bedtime.   . vitamin B-12 (CYANOCOBALAMIN) 1000 MCG tablet Take 2,000 mcg by mouth daily at 3 pm.  . [DISCONTINUED] amLODipine (NORVASC) 10 MG tablet TAKE 1 TABLET BY MOUTH ONCE DAILY  . [DISCONTINUED] potassium chloride (KLOR-CON 10) 10 MEQ tablet Take 2 tablets by mouth daily (Patient taking differently: Take 20 mEq by mouth daily at 3 pm. )     Allergies:   Patient has no known allergies.   Social History   Social History  . Marital status: Married    Spouse name: N/A  . Number of children: N/A  . Years of education: N/A   Social History Main Topics  . Smoking status: Former Smoker    Packs/day: 0.50    Years: 36.00    Types: Cigarettes    Quit date: 07/05/1994  . Smokeless tobacco: Never Used  . Alcohol use 4.2 oz/week    7 Glasses of wine per week  . Drug use: No  . Sexual activity: Not Asked   Other Topics Concern  . None   Social History Narrative  . None     family history includes Alzheimer's disease in her father, paternal aunt, and paternal aunt; Breast cancer in her paternal aunt; Cancer in her sister; Cervical cancer (age of onset: 51) in her cousin; Colon cancer in her brother; Diabetes in her maternal grandfather; Heart attack in her paternal grandfather; Heart  attack (age of onset: 32) in her paternal uncle; Heart disease in her maternal grandfather; Heart failure in her father; Leukemia (age of onset: 104) in her grandchild; Ovarian cancer in her sister; Skin cancer in her grandchild; Stroke in her maternal aunt; Stroke (age of onset: 71) in her paternal grandmother; Stroke (age of onset: 92) in her mother. There is no history of Colon polyps, Rectal cancer, or Stomach cancer. ROS:   Please see the history of present illness.     All other systems reviewed and are negative.   EKGs/Labs/Other Studies Reviewed:    EKG:  EKG is ordered today.  The ekg ordered today demonstrates Second-degree AV block type I. P to P interval is regular.  Recent Labs: 10/11/2016: ALT 32; TSH 0.67 11/12/2016: BUN 16; Creatinine, Ser 0.81; Hemoglobin 11.7; Platelets 194; Potassium 3.9; Sodium 137   Recent Lipid Panel    Component Value Date/Time   CHOL 214 (H) 10/11/2016 1243   TRIG 276.0 (H) 10/11/2016 1243   HDL 60.90 10/11/2016 1243   CHOLHDL 4 10/11/2016 1243   VLDL 55.2 (H) 10/11/2016 1243   LDLCALC 136 (H) 10/02/2015 0950   LDLDIRECT 114.0 10/11/2016 1243    Physical Exam:    VS:  BP 128/86   Pulse (!) 104   Ht 5\' 4"  (1.626 m)   Wt 197 lb 1.9 oz (89.4 kg)   LMP  (LMP Unknown) Comment: full  BMI 33.84 kg/m     Wt Readings from Last 3 Encounters:  04/28/17 197 lb 1.9 oz (89.4 kg)  11/11/16 189 lb (85.7 kg)  11/05/16 191 lb (86.6 kg)    GEN: Well nourished, well developed, in no acute distress , overweight HEENT: normal  Neck: no JVD, carotid bruits, or masses Cardiac: RRR; no murmurs, rubs, or gallops,no edema  Respiratory:  clear to auscultation bilaterally, normal work of breathing  GI: soft, nontender, nondistended, + BS MS: no deformity or atrophy  Skin: warm and dry, no rash Neuro:  Alert and Oriented x 3, Strength and sensation are intact Psych: euthymic mood, full affect  ASSESSMENT:    1. LBBB (left bundle branch block)   2. Second  degree heart block    PLAN:    In order of problems listed above:  Transient Second heart block type I, Wenckebach.    - She has not had any high risk symptoms such as syncope. She has noted the irregular pulse warning on her blood pressure machine originally.  She has not noticed this in quite some time.  -Originally we had stopped her timolol eyedrops, but now she desires to get back on these because she is worried that her eyesight is getting worse.  I think it would be reasonable to resume.  LBBB  - as above.   She gets an EKG during yearly physical.  If there are any difficulties, please let us know and we will be happy to follow-up with her.  At this point, as-needed basis.  Medication Adjustments/Labs and Tests Ordered: Current medicines are reviewed at length with the patient today.  Concerns regarding medicines are outlined above. Labs and tests ordered and medication changes are outlined in the patient instructions below:  Patient Instructions  Medication Instructions:  The current medical regimen is effective;  continue present plan and medications.  Follow-Up: Follow up as needed with Dr Marlou Porch  Thank you for choosing [facility]!!        Signed, Candee Furbish, MD  04/28/2017 4:39 PM    [place]

## 2017-05-23 DIAGNOSIS — Z96642 Presence of left artificial hip joint: Secondary | ICD-10-CM | POA: Diagnosis not present

## 2017-05-23 DIAGNOSIS — Z471 Aftercare following joint replacement surgery: Secondary | ICD-10-CM | POA: Diagnosis not present

## 2017-06-07 DIAGNOSIS — H04123 Dry eye syndrome of bilateral lacrimal glands: Secondary | ICD-10-CM | POA: Diagnosis not present

## 2017-06-07 DIAGNOSIS — H43813 Vitreous degeneration, bilateral: Secondary | ICD-10-CM | POA: Diagnosis not present

## 2017-06-07 DIAGNOSIS — H353133 Nonexudative age-related macular degeneration, bilateral, advanced atrophic without subfoveal involvement: Secondary | ICD-10-CM | POA: Diagnosis not present

## 2017-06-07 DIAGNOSIS — H401131 Primary open-angle glaucoma, bilateral, mild stage: Secondary | ICD-10-CM | POA: Diagnosis not present

## 2017-06-20 ENCOUNTER — Ambulatory Visit
Admission: RE | Admit: 2017-06-20 | Discharge: 2017-06-20 | Disposition: A | Discharge status: Home / Self Care | Payer: Medicare Other | Source: Ambulatory Visit | Attending: Family Medicine | Admitting: Family Medicine

## 2017-06-20 DIAGNOSIS — R921 Mammographic calcification found on diagnostic imaging of breast: Secondary | ICD-10-CM

## 2017-06-20 DIAGNOSIS — R922 Inconclusive mammogram: Secondary | ICD-10-CM | POA: Diagnosis not present

## 2017-06-22 DIAGNOSIS — M25511 Pain in right shoulder: Secondary | ICD-10-CM | POA: Diagnosis not present

## 2017-06-22 DIAGNOSIS — G8929 Other chronic pain: Secondary | ICD-10-CM | POA: Diagnosis not present

## 2017-06-23 ENCOUNTER — Telehealth: Payer: Self-pay | Admitting: Family Medicine

## 2017-06-23 NOTE — Telephone Encounter (Signed)
Caller requesting med list information

## 2017-07-07 DIAGNOSIS — M25511 Pain in right shoulder: Secondary | ICD-10-CM | POA: Diagnosis not present

## 2017-07-07 DIAGNOSIS — M25519 Pain in unspecified shoulder: Secondary | ICD-10-CM | POA: Diagnosis not present

## 2017-07-14 DIAGNOSIS — M13811 Other specified arthritis, right shoulder: Secondary | ICD-10-CM | POA: Diagnosis not present

## 2017-07-14 DIAGNOSIS — M13819 Other specified arthritis, unspecified shoulder: Secondary | ICD-10-CM | POA: Diagnosis not present

## 2017-07-22 DIAGNOSIS — M25511 Pain in right shoulder: Secondary | ICD-10-CM | POA: Diagnosis not present

## 2017-07-22 DIAGNOSIS — M19011 Primary osteoarthritis, right shoulder: Secondary | ICD-10-CM | POA: Diagnosis not present

## 2017-07-26 ENCOUNTER — Encounter: Payer: Self-pay | Admitting: Family Medicine

## 2017-07-26 ENCOUNTER — Other Ambulatory Visit: Payer: Self-pay | Admitting: Family Medicine

## 2017-07-27 NOTE — Telephone Encounter (Signed)
Last OV 10/11/2016  Last refilled 03/02/2017 disp 270 with no refills   Sent to PCP for approval

## 2017-07-28 MED ORDER — CYCLOBENZAPRINE HCL 10 MG PO TABS: 10.0000 mg | ORAL_TABLET | Freq: Three times a day (TID) | ORAL | 5 refills | Status: DC | PRN

## 2017-07-28 NOTE — Telephone Encounter (Signed)
Which med does she want refilled?

## 2017-07-29 ENCOUNTER — Encounter: Payer: Self-pay | Admitting: Family Medicine

## 2017-08-01 NOTE — Telephone Encounter (Signed)
Checking status? Please advise

## 2017-08-02 NOTE — Telephone Encounter (Signed)
Call in Nabumetone 500 mg bid #180 with 3 rf

## 2017-08-03 ENCOUNTER — Other Ambulatory Visit: Payer: Self-pay

## 2017-08-03 MED ORDER — NABUMETONE 500 MG PO TABS: 500.0000 mg | ORAL_TABLET | Freq: Two times a day (BID) | ORAL | 3 refills | Status: DC

## 2017-08-03 NOTE — Telephone Encounter (Signed)
Call in Nabumetone 500 mg bid #180 with 3 rf

## 2017-08-08 DIAGNOSIS — M19011 Primary osteoarthritis, right shoulder: Secondary | ICD-10-CM | POA: Diagnosis not present

## 2017-08-08 DIAGNOSIS — M25511 Pain in right shoulder: Secondary | ICD-10-CM | POA: Diagnosis not present

## 2017-08-15 DIAGNOSIS — H5310 Unspecified subjective visual disturbances: Secondary | ICD-10-CM | POA: Diagnosis not present

## 2017-08-15 DIAGNOSIS — H5711 Ocular pain, right eye: Secondary | ICD-10-CM | POA: Diagnosis not present

## 2017-08-15 DIAGNOSIS — H43813 Vitreous degeneration, bilateral: Secondary | ICD-10-CM | POA: Diagnosis not present

## 2017-09-18 ENCOUNTER — Other Ambulatory Visit: Payer: Self-pay | Admitting: Family Medicine

## 2017-09-19 ENCOUNTER — Other Ambulatory Visit: Payer: Self-pay | Admitting: Family Medicine

## 2017-10-13 ENCOUNTER — Encounter: Payer: Self-pay | Admitting: Family Medicine

## 2017-10-13 ENCOUNTER — Ambulatory Visit (INDEPENDENT_AMBULATORY_CARE_PROVIDER_SITE_OTHER): Payer: Medicare Other | Admitting: Family Medicine

## 2017-10-13 VITALS — BP 114/68 | HR 80 | Temp 97.9°F | Ht 63.5 in | Wt 204.6 lb

## 2017-10-13 DIAGNOSIS — I10 Essential (primary) hypertension: Secondary | ICD-10-CM | POA: Diagnosis not present

## 2017-10-13 DIAGNOSIS — M159 Polyosteoarthritis, unspecified: Secondary | ICD-10-CM

## 2017-10-13 DIAGNOSIS — E7800 Pure hypercholesterolemia, unspecified: Secondary | ICD-10-CM

## 2017-10-13 DIAGNOSIS — M353 Polymyalgia rheumatica: Secondary | ICD-10-CM

## 2017-10-13 DIAGNOSIS — M150 Primary generalized (osteo)arthritis: Secondary | ICD-10-CM | POA: Diagnosis not present

## 2017-10-13 LAB — POC URINALSYSI DIPSTICK (AUTOMATED)
BILIRUBIN UA: NEGATIVE
Blood, UA: NEGATIVE
Glucose, UA: NEGATIVE
KETONES UA: NEGATIVE
Nitrite, UA: NEGATIVE
PROTEIN UA: NEGATIVE
Spec Grav, UA: 1.02 (ref 1.010–1.025)
Urobilinogen, UA: 0.2 E.U./dL
pH, UA: 6 (ref 5.0–8.0)

## 2017-10-13 LAB — LIPID PANEL
CHOL/HDL RATIO: 3
Cholesterol: 158 mg/dL (ref 0–200)
HDL: 51 mg/dL (ref >39.00)
LDL Cholesterol: 81 mg/dL (ref 0–99)
NONHDL: 107.28
Triglycerides: 132 mg/dL (ref 0.0–149.0)
VLDL: 26.4 mg/dL (ref 0.0–40.0)

## 2017-10-13 LAB — CBC WITH DIFFERENTIAL/PLATELET
Basophils Absolute: 0.1 10*3/uL (ref 0.0–0.1)
Basophils Relative: 1 % (ref 0.0–3.0)
EOS PCT: 5.7 % — AB (ref 0.0–5.0)
Eosinophils Absolute: 0.5 10*3/uL (ref 0.0–0.7)
HEMATOCRIT: 41.8 % (ref 36.0–46.0)
Hemoglobin: 14.5 g/dL (ref 12.0–15.0)
LYMPHS ABS: 2.3 10*3/uL (ref 0.7–4.0)
Lymphocytes Relative: 26 % (ref 12.0–46.0)
MCHC: 34.7 g/dL (ref 30.0–36.0)
MCV: 97.7 fl (ref 78.0–100.0)
MONOS PCT: 10.6 % (ref 3.0–12.0)
Monocytes Absolute: 0.9 10*3/uL (ref 0.1–1.0)
Neutro Abs: 5.1 10*3/uL (ref 1.4–7.7)
Neutrophils Relative %: 56.7 % (ref 43.0–77.0)
Platelets: 193 10*3/uL (ref 150.0–400.0)
RBC: 4.28 Mil/uL (ref 3.87–5.11)
RDW: 12.6 % (ref 11.5–15.5)
WBC: 8.9 10*3/uL (ref 4.0–10.5)

## 2017-10-13 LAB — BASIC METABOLIC PANEL
BUN: 23 mg/dL (ref 6–23)
CHLORIDE: 97 meq/L (ref 96–112)
CO2: 31 mEq/L (ref 19–32)
Calcium: 9.6 mg/dL (ref 8.4–10.5)
Creatinine, Ser: 0.97 mg/dL (ref 0.40–1.20)
GFR: 59.05 mL/min — ABNORMAL LOW (ref >60.00)
Glucose, Bld: 123 mg/dL — ABNORMAL HIGH (ref 70–99)
POTASSIUM: 4.1 meq/L (ref 3.5–5.1)
SODIUM: 138 meq/L (ref 135–145)

## 2017-10-13 LAB — HEPATIC FUNCTION PANEL
ALT: 65 U/L — AB (ref 0–35)
AST: 62 U/L — AB (ref 0–37)
Albumin: 4.4 g/dL (ref 3.5–5.2)
Alkaline Phosphatase: 123 U/L — ABNORMAL HIGH (ref 39–117)
BILIRUBIN DIRECT: 0.2 mg/dL (ref 0.0–0.3)
Total Bilirubin: 0.7 mg/dL (ref 0.2–1.2)
Total Protein: 7 g/dL (ref 6.0–8.3)

## 2017-10-13 LAB — TSH: TSH: 1.45 u[IU]/mL (ref 0.35–4.50)

## 2017-10-13 NOTE — Progress Notes (Signed)
   Subjective:    Patient ID: Connie Lang, female    DOB: 1941/06/08, 77 y.o.   MRN: [id number]  HPI Here to follow up on issues. She feels well other than her joint pains. She had a left hip replacement in May 2018 and now she is troubled with shoulder pain. Her BP has been stable. She was due a colonoscopy last year but did not have this done because she was occupied with her orthopedic concerns.    Review of Systems  Constitutional: Negative.   HENT: Negative.   Eyes: Negative.   Respiratory: Negative.   Cardiovascular: Negative.   Gastrointestinal: Negative.   Genitourinary: Negative for decreased urine volume, difficulty urinating, dyspareunia, dysuria, enuresis, flank pain, frequency, hematuria, pelvic pain and urgency.  Musculoskeletal: Positive for arthralgias.  Skin: Negative.   Neurological: Negative.   Psychiatric/Behavioral: Negative.        Objective:   Physical Exam  Constitutional: She is oriented to person, place, and time. She appears well-developed and well-nourished. No distress.  HENT:  Head: Normocephalic and atraumatic.  Right Ear: External ear normal.  Left Ear: External ear normal.  Nose: Nose normal.  Mouth/Throat: Oropharynx is clear and moist. No oropharyngeal exudate.  Eyes: Pupils are equal, round, and reactive to light. Conjunctivae and EOM are normal. No scleral icterus.  Neck: Normal range of motion. Neck supple. No JVD present. No thyromegaly present.  Cardiovascular: Normal rate, regular rhythm, normal heart sounds and intact distal pulses. Exam reveals no gallop and no friction rub.  No murmur heard. Pulmonary/Chest: Effort normal and breath sounds normal. No respiratory distress. She has no wheezes. She has no rales. She exhibits no tenderness.  Abdominal: Soft. Bowel sounds are normal. She exhibits no distension and no mass. There is no tenderness. There is no rebound and no guarding.  Musculoskeletal: Normal range of motion. She  exhibits no edema or tenderness.  Lymphadenopathy:    She has no cervical adenopathy.  Neurological: She is alert and oriented to person, place, and time. She has normal reflexes. No cranial nerve deficit. She exhibits normal muscle tone. Coordination normal.  Skin: Skin is warm and dry. No rash noted. No erythema.  Psychiatric: She has a normal mood and affect. Her behavior is normal. Judgment and thought content normal.          Assessment & Plan:  Her HTN is stable. She will follow up with Orthopedics for the joint issues. She agrees to set up the colonoscopy soon, and she will contact the GI office. Get fasting labs today. Meds were refilled.  Alysia Penna, MD

## 2017-10-19 ENCOUNTER — Other Ambulatory Visit: Payer: Self-pay

## 2017-10-19 MED ORDER — NITROFURANTOIN MONOHYD MACRO 100 MG PO CAPS: 100.0000 mg | ORAL_CAPSULE | Freq: Two times a day (BID) | ORAL | 0 refills | Status: DC

## 2017-10-19 NOTE — Telephone Encounter (Signed)
Macrobid 100 mg bid for 7 days  Called and spoke with pt. Pt would like medication sent to [facility] on [address] Rx sent.

## 2017-11-01 DIAGNOSIS — M1612 Unilateral primary osteoarthritis, left hip: Secondary | ICD-10-CM | POA: Diagnosis not present

## 2017-11-02 ENCOUNTER — Other Ambulatory Visit: Payer: Self-pay | Admitting: Family Medicine

## 2017-11-02 DIAGNOSIS — Z853 Personal history of malignant neoplasm of breast: Secondary | ICD-10-CM

## 2017-12-14 ENCOUNTER — Telehealth: Payer: Self-pay | Admitting: Family Medicine

## 2017-12-14 NOTE — Telephone Encounter (Signed)
Last OV  10/13/2017

## 2017-12-19 ENCOUNTER — Encounter: Payer: Self-pay | Admitting: Family Medicine

## 2017-12-19 MED ORDER — PANTOPRAZOLE SODIUM 40 MG PO TBEC: 40.0000 mg | DELAYED_RELEASE_TABLET | Freq: Every day | ORAL | 3 refills | Status: DC

## 2017-12-19 MED ORDER — AMLODIPINE BESYLATE 10 MG PO TABS: 10.0000 mg | ORAL_TABLET | Freq: Every day | ORAL | 3 refills | Status: DC

## 2017-12-19 MED ORDER — SIMVASTATIN 20 MG PO TABS: 20.0000 mg | ORAL_TABLET | Freq: Every day | ORAL | 3 refills | Status: DC

## 2017-12-19 MED ORDER — POTASSIUM CHLORIDE CRYS ER 10 MEQ PO TBCR: EXTENDED_RELEASE_TABLET | ORAL | 3 refills | Status: DC

## 2017-12-19 MED ORDER — LOSARTAN POTASSIUM 50 MG PO TABS: 50.0000 mg | ORAL_TABLET | Freq: Every day | ORAL | 3 refills | Status: DC

## 2017-12-19 MED ORDER — FUROSEMIDE 40 MG PO TABS: 40.0000 mg | ORAL_TABLET | Freq: Two times a day (BID) | ORAL | 3 refills | Status: DC

## 2017-12-19 NOTE — Telephone Encounter (Signed)
Pt called and [facility] did not get prescriptions - shows transmission failed.  Please re-send all prescriptions in

## 2017-12-19 NOTE — Addendum Note (Signed)
Addended by: Dimple Nanas on: 12/19/2017 05:27 PM   Modules accepted: Orders

## 2017-12-19 NOTE — Telephone Encounter (Signed)
Medications resent to pharmacy. Prescriptions show that transmission failed.

## 2017-12-20 ENCOUNTER — Ambulatory Visit
Admission: RE | Admit: 2017-12-20 | Discharge: 2017-12-20 | Disposition: A | Discharge status: Home / Self Care | Payer: Medicare Other | Source: Ambulatory Visit | Attending: Family Medicine | Admitting: Family Medicine

## 2017-12-20 DIAGNOSIS — Z853 Personal history of malignant neoplasm of breast: Secondary | ICD-10-CM

## 2017-12-20 DIAGNOSIS — R922 Inconclusive mammogram: Secondary | ICD-10-CM | POA: Diagnosis not present

## 2018-01-03 NOTE — Telephone Encounter (Signed)
RN triage opened in error.

## 2018-01-11 ENCOUNTER — Encounter: Payer: Self-pay | Admitting: Gastroenterology

## 2018-02-27 DIAGNOSIS — D0511 Intraductal carcinoma in situ of right breast: Secondary | ICD-10-CM | POA: Diagnosis not present

## 2018-02-27 HISTORY — PX: BREAST LUMPECTOMY: SHX2

## 2018-03-03 DIAGNOSIS — H401131 Primary open-angle glaucoma, bilateral, mild stage: Secondary | ICD-10-CM | POA: Diagnosis not present

## 2018-03-03 DIAGNOSIS — H04123 Dry eye syndrome of bilateral lacrimal glands: Secondary | ICD-10-CM | POA: Diagnosis not present

## 2018-03-07 ENCOUNTER — Encounter: Payer: Self-pay | Admitting: Gastroenterology

## 2018-03-07 ENCOUNTER — Ambulatory Visit (AMBULATORY_SURGERY_CENTER): Payer: Self-pay

## 2018-03-07 ENCOUNTER — Other Ambulatory Visit: Payer: Self-pay

## 2018-03-07 VITALS — Ht 64.0 in | Wt 199.4 lb

## 2018-03-07 DIAGNOSIS — Z86010 Personal history of colonic polyps: Secondary | ICD-10-CM

## 2018-03-07 NOTE — Progress Notes (Signed)
No egg or soy allergy known to patient  No issues with past sedation with any surgeries  or procedures, no intubation problems  No diet pills per patient No home 02 use per patient  No blood thinners per patient  Pt denies issues with constipation, pt verbalize constipation under control  No A fib or A flutter  EMMI video sent to pt's e mail

## 2018-03-21 ENCOUNTER — Ambulatory Visit (AMBULATORY_SURGERY_CENTER): Payer: Medicare Other | Admitting: Gastroenterology

## 2018-03-21 ENCOUNTER — Encounter: Payer: Self-pay | Admitting: Gastroenterology

## 2018-03-21 VITALS — BP 119/62 | HR 74 | Temp 97.8°F | Resp 15 | Ht 63.0 in | Wt 204.0 lb

## 2018-03-21 DIAGNOSIS — Z86010 Personal history of colonic polyps: Secondary | ICD-10-CM | POA: Diagnosis not present

## 2018-03-21 DIAGNOSIS — D123 Benign neoplasm of transverse colon: Secondary | ICD-10-CM

## 2018-03-21 DIAGNOSIS — I10 Essential (primary) hypertension: Secondary | ICD-10-CM | POA: Diagnosis not present

## 2018-03-21 DIAGNOSIS — D125 Benign neoplasm of sigmoid colon: Secondary | ICD-10-CM | POA: Diagnosis not present

## 2018-03-21 DIAGNOSIS — D124 Benign neoplasm of descending colon: Secondary | ICD-10-CM | POA: Diagnosis not present

## 2018-03-21 DIAGNOSIS — D128 Benign neoplasm of rectum: Secondary | ICD-10-CM | POA: Diagnosis not present

## 2018-03-21 DIAGNOSIS — K219 Gastro-esophageal reflux disease without esophagitis: Secondary | ICD-10-CM | POA: Diagnosis not present

## 2018-03-21 HISTORY — PX: COLONOSCOPY: SHX174

## 2018-03-21 MED ORDER — SODIUM CHLORIDE 0.9 % IV SOLN: 500.0000 mL | Freq: Once | INTRAVENOUS | Status: DC

## 2018-03-21 NOTE — Progress Notes (Signed)
Called to room to assist during endoscopic procedure.  Patient ID and intended procedure confirmed with present staff. Received instructions for my participation in the procedure from the performing physician.  

## 2018-03-21 NOTE — Patient Instructions (Signed)
Discharge instructions given. Handouts on polyps,diverticulosis and hemorrhoids. Resume previous medications. YOU HAD AN ENDOSCOPIC PROCEDURE TODAY AT THE [facility]:   Refer to the procedure report that was given to you for any specific questions about what was found during the examination.  If the procedure report does not answer your questions, please call your gastroenterologist to clarify.  If you requested that your care partner not be given the details of your procedure findings, then the procedure report has been included in a sealed envelope for you to review at your convenience later.  YOU SHOULD EXPECT: Some feelings of bloating in the abdomen. Passage of more gas than usual.  Walking can help get rid of the air that was put into your GI tract during the procedure and reduce the bloating. If you had a lower endoscopy (such as a colonoscopy or flexible sigmoidoscopy) you may notice spotting of blood in your stool or on the toilet paper. If you underwent a bowel prep for your procedure, you may not have a normal bowel movement for a few days.  Please Note:  You might notice some irritation and congestion in your nose or some drainage.  This is from the oxygen used during your procedure.  There is no need for concern and it should clear up in a day or so.  SYMPTOMS TO REPORT IMMEDIATELY:   Following lower endoscopy (colonoscopy or flexible sigmoidoscopy):  Excessive amounts of blood in the stool  Significant tenderness or worsening of abdominal pains  Swelling of the abdomen that is new, acute  Fever of 100F or higher   For urgent or emergent issues, a gastroenterologist can be reached at any hour by calling [phone].   DIET:  We do recommend a small meal at first, but then you may proceed to your regular diet.  Drink plenty of fluids but you should avoid alcoholic beverages for 24 hours.  ACTIVITY:  You should plan to take it easy for the rest of today and you  should NOT DRIVE or use heavy machinery until tomorrow (because of the sedation medicines used during the test).    FOLLOW UP: Our staff will call the number listed on your records the next business day following your procedure to check on you and address any questions or concerns that you may have regarding the information given to you following your procedure. If we do not reach you, we will leave a message.  However, if you are feeling well and you are not experiencing any problems, there is no need to return our call.  We will assume that you have returned to your regular daily activities without incident.  If any biopsies were taken you will be contacted by phone or by letter within the next 1-3 weeks.  Please call us at [phone] if you have not heard about the biopsies in 3 weeks.    SIGNATURES/CONFIDENTIALITY: You and/or your care partner have signed paperwork which will be entered into your electronic medical record.  These signatures attest to the fact that that the information above on your After Visit Summary has been reviewed and is understood.  Full responsibility of the confidentiality of this discharge information lies with you and/or your care-partner. 

## 2018-03-21 NOTE — Op Note (Signed)
[facility] Patient Name: Connie Lang Procedure Date: 03/21/2018 1:29 PM MRN: [id number] Endoscopist: Mauri Pole , MD Age: 77 Referring MD:  Date of Birth: Oct 05, 1940 Gender: Female Account #: [id number] Procedure:                Colonoscopy Indications:              High risk colon cancer surveillance: Personal                            history of colonic polyps, Surveillance: Piecemeal                            removal of large sessile adenoma last colonoscopy                            (< 3 yrs) Medicines:                Monitored Anesthesia Care Procedure:                Pre-Anesthesia Assessment:                           - Prior to the procedure, a History and Physical                            was performed, and patient medications and                            allergies were reviewed. The patient's tolerance of                            previous anesthesia was also reviewed. The risks                            and benefits of the procedure and the sedation                            options and risks were discussed with the patient.                            All questions were answered, and informed consent                            was obtained. Prior Anticoagulants: The patient has                            taken no previous anticoagulant or antiplatelet                            agents. ASA Grade Assessment: II - A patient with                            mild systemic disease. After reviewing the risks  and benefits, the patient was deemed in                            satisfactory condition to undergo the procedure.                           After obtaining informed consent, the colonoscope                            was passed under direct vision. Throughout the                            procedure, the patient's blood pressure, pulse, and                            oxygen saturations were monitored  continuously. The                            Model PCF-[id number]) scope was introduced                            through the anus and advanced to the the cecum,                            identified by appendiceal orifice and ileocecal                            valve. The colonoscopy was performed without                            difficulty. The patient tolerated the procedure                            well. The quality of the bowel preparation was                            excellent. The ileocecal valve, appendiceal                            orifice, and rectum were photographed. Scope In: 1:50:00 PM Scope Out: 2:17:40 PM Scope Withdrawal Time: 0 hours 11 minutes 48 seconds  Total Procedure Duration: 0 hours 27 minutes 40 seconds  Findings:                 The perianal and digital rectal examinations were                            normal.                           A 2 mm polyp was found in the transverse colon. The                            polyp was sessile. The polyp was removed with a  cold biopsy forceps. Resection and retrieval were                            complete.                           Four sessile polyps were found in the rectum,                            sigmoid colon and descending colon. The polyps were                            4 to 5 mm in size. These polyps were removed with a                            cold snare. Resection and retrieval were complete.                           Multiple small and large-mouthed diverticula were                            found in the sigmoid colon and descending colon.                            There was narrowing of the colon in association                            with the diverticular opening. There was evidence                            of diverticular spasm. There was no evidence of                            diverticular bleeding.                           Non-bleeding internal  hemorrhoids were found during                            retroflexion. The hemorrhoids were medium-sized. Complications:            No immediate complications. Estimated Blood Loss:     Estimated blood loss was minimal. Impression:               - One 2 mm polyp in the transverse colon, removed                            with a cold biopsy forceps. Resected and retrieved.                           - Four 4 to 5 mm polyps in the rectum, in the                            sigmoid colon and in the descending  colon, removed                            with a cold snare. Resected and retrieved.                           - Severe diverticulosis in the sigmoid colon and in                            the descending colon. There was narrowing of the                            colon in association with the diverticular opening.                            There was evidence of diverticular spasm. There was                            no evidence of diverticular bleeding.                           - Non-bleeding internal hemorrhoids. Recommendation:           - Patient has a contact number available for                            emergencies. The signs and symptoms of potential                            delayed complications were discussed with the                            patient. Return to normal activities tomorrow.                            Written discharge instructions were provided to the                            patient.                           - Resume previous diet.                           - Continue present medications.                           - Await pathology results.                           - No repeat colonoscopy due to age. Mauri Pole, MD 03/21/2018 2:24:30 PM This report has been signed electronically.

## 2018-03-21 NOTE — Progress Notes (Signed)
Report to PACU, RN, vss, BBS= Clear.  

## 2018-03-22 ENCOUNTER — Telehealth: Payer: Self-pay

## 2018-03-22 NOTE — Telephone Encounter (Signed)
  Follow up Call-  Call back number 03/21/2018 03/19/2016  Post procedure Call Back phone  # [phone] home  Permission to leave phone message Yes Yes  Some recent data might be hidden     Patient questions:  Do you have a fever, pain , or abdominal swelling? No. Pain Score  0 *  Have you tolerated food without any problems? Yes.    Have you been able to return to your normal activities? Yes.    Do you have any questions about your discharge instructions: Diet   No. Medications  No. Follow up visit  No.  Do you have questions or concerns about your Care? No.  Actions: * If pain score is 4 or above: No action needed, pain <4.

## 2018-03-28 ENCOUNTER — Ambulatory Visit (INDEPENDENT_AMBULATORY_CARE_PROVIDER_SITE_OTHER): Payer: Medicare Other | Admitting: *Deleted

## 2018-03-28 DIAGNOSIS — Z23 Encounter for immunization: Secondary | ICD-10-CM | POA: Diagnosis not present

## 2018-03-28 NOTE — Progress Notes (Signed)
Per orders of Dr. Sarajane Jews, injection of influenza given by Westley Hummer. Patient tolerated injection well.

## 2018-04-05 ENCOUNTER — Encounter: Payer: Self-pay | Admitting: Gastroenterology

## 2018-08-29 DIAGNOSIS — H353133 Nonexudative age-related macular degeneration, bilateral, advanced atrophic without subfoveal involvement: Secondary | ICD-10-CM | POA: Diagnosis not present

## 2018-08-29 DIAGNOSIS — H401131 Primary open-angle glaucoma, bilateral, mild stage: Secondary | ICD-10-CM | POA: Diagnosis not present

## 2018-08-29 DIAGNOSIS — H1859 Other hereditary corneal dystrophies: Secondary | ICD-10-CM | POA: Diagnosis not present

## 2018-08-29 DIAGNOSIS — H43813 Vitreous degeneration, bilateral: Secondary | ICD-10-CM | POA: Diagnosis not present

## 2018-09-11 ENCOUNTER — Other Ambulatory Visit: Payer: Self-pay | Admitting: General Surgery

## 2018-09-11 ENCOUNTER — Other Ambulatory Visit: Payer: Self-pay | Admitting: Family Medicine

## 2018-09-11 NOTE — Telephone Encounter (Signed)
Dr. Sarajane Jews please advise on refill of medication.  Thanks

## 2018-09-12 DIAGNOSIS — Z0120 Encounter for dental examination and cleaning without abnormal findings: Secondary | ICD-10-CM | POA: Diagnosis not present

## 2018-09-18 DIAGNOSIS — D0511 Intraductal carcinoma in situ of right breast: Secondary | ICD-10-CM | POA: Diagnosis not present

## 2018-09-18 DIAGNOSIS — N6310 Unspecified lump in the right breast, unspecified quadrant: Secondary | ICD-10-CM | POA: Diagnosis not present

## 2018-09-20 ENCOUNTER — Other Ambulatory Visit: Payer: Self-pay | Admitting: General Surgery

## 2018-09-20 DIAGNOSIS — N6310 Unspecified lump in the right breast, unspecified quadrant: Secondary | ICD-10-CM

## 2018-10-03 ENCOUNTER — Other Ambulatory Visit: Payer: Medicare Other

## 2018-10-03 ENCOUNTER — Inpatient Hospital Stay: Admission: RE | Admit: 2018-10-03 | Payer: Medicare Other | Source: Ambulatory Visit

## 2018-10-17 ENCOUNTER — Other Ambulatory Visit: Payer: Medicare Other

## 2018-10-17 ENCOUNTER — Encounter: Payer: Medicare Other | Admitting: Family Medicine

## 2018-11-14 ENCOUNTER — Other Ambulatory Visit: Payer: Self-pay

## 2018-11-14 ENCOUNTER — Ambulatory Visit
Admission: RE | Admit: 2018-11-14 | Discharge: 2018-11-14 | Disposition: A | Discharge status: Home / Self Care | Payer: Medicare Other | Source: Ambulatory Visit | Attending: General Surgery | Admitting: General Surgery

## 2018-11-14 DIAGNOSIS — N6310 Unspecified lump in the right breast, unspecified quadrant: Secondary | ICD-10-CM

## 2018-11-22 ENCOUNTER — Other Ambulatory Visit: Payer: Self-pay | Admitting: Family Medicine

## 2018-11-22 DIAGNOSIS — Z1231 Encounter for screening mammogram for malignant neoplasm of breast: Secondary | ICD-10-CM

## 2018-12-01 ENCOUNTER — Other Ambulatory Visit: Payer: Self-pay | Admitting: Family Medicine

## 2018-12-05 ENCOUNTER — Telehealth: Payer: Self-pay | Admitting: Family Medicine

## 2018-12-05 NOTE — Telephone Encounter (Signed)
Copied from [facility] [phone]. Topic: Quick Communication - Rx Refill/Question >> Dec 05, 2018  2:46 PM Gustavus Messing wrote: Medication: losartan (COZAAR) 50 MG tablet [[id number]]  furosemide (LASIX) 40 MG tablet [[id number]]  simvastatin (ZOCOR) 20 MG tablet [[id number]]  amLODipine (NORVASC) 5 MG tablet [[id number]]  pantoprazole (PROTONIX) 40 MG tablet [[id number]]  potassium chloride (K-DUR,KLOR-CON) 10 MEQ tablet [[id number]]  TRAVATAN Z 0.004 % SOLN ophthalmic solution [[id number]]   Has the patient contacted their pharmacy? Yes.   (Agent: If yes, when and what did the pharmacy advise?) The pharmacy told the patient that these medications were called in but cpu;d not be found. The patient needs a 90-day supply because of insurance  Preferred Pharmacy (with phone number or street name): [place], [place] - [address] [phone] (Phone) [phone] (Fax)    Agent: Please be advised that RX refills may take up to 3 business days. We ask that you follow-up with your pharmacy.

## 2018-12-07 ENCOUNTER — Other Ambulatory Visit: Payer: Self-pay | Admitting: Family Medicine

## 2018-12-07 MED ORDER — SIMVASTATIN 20 MG PO TABS: 20.0000 mg | ORAL_TABLET | Freq: Every day | ORAL | 0 refills | Status: DC

## 2018-12-07 MED ORDER — TRAVATAN Z 0.004 % OP SOLN: 1.0000 [drp] | Freq: Every day | OPHTHALMIC | 0 refills | Status: AC

## 2018-12-07 MED ORDER — LOSARTAN POTASSIUM 50 MG PO TABS: 50.0000 mg | ORAL_TABLET | Freq: Every day | ORAL | 0 refills | Status: DC

## 2018-12-07 MED ORDER — FUROSEMIDE 40 MG PO TABS: 40.0000 mg | ORAL_TABLET | Freq: Two times a day (BID) | ORAL | 0 refills | Status: DC

## 2018-12-07 MED ORDER — AMLODIPINE BESYLATE 5 MG PO TABS: 5.0000 mg | ORAL_TABLET | Freq: Every evening | ORAL | 0 refills | Status: DC

## 2018-12-07 MED ORDER — PANTOPRAZOLE SODIUM 40 MG PO TBEC: 40.0000 mg | DELAYED_RELEASE_TABLET | Freq: Every day | ORAL | 0 refills | Status: DC

## 2018-12-07 MED ORDER — POTASSIUM CHLORIDE CRYS ER 10 MEQ PO TBCR: EXTENDED_RELEASE_TABLET | ORAL | 0 refills | Status: DC

## 2018-12-07 NOTE — Telephone Encounter (Signed)
Patient has scheduled an appt for a physical.  She needs 90 day supplies of her medications called in due to her insurance.

## 2018-12-07 NOTE — Telephone Encounter (Signed)
Refills have been sent to the pharmacy and the pt has made the cpx appt

## 2018-12-23 ENCOUNTER — Ambulatory Visit
Admission: RE | Admit: 2018-12-23 | Discharge: 2018-12-23 | Disposition: A | Discharge status: Home / Self Care | Payer: Medicare Other | Source: Ambulatory Visit | Attending: Family Medicine | Admitting: Family Medicine

## 2018-12-23 ENCOUNTER — Other Ambulatory Visit: Payer: Self-pay

## 2018-12-23 DIAGNOSIS — Z1231 Encounter for screening mammogram for malignant neoplasm of breast: Secondary | ICD-10-CM

## 2018-12-23 DIAGNOSIS — Z853 Personal history of malignant neoplasm of breast: Secondary | ICD-10-CM

## 2018-12-25 ENCOUNTER — Other Ambulatory Visit: Payer: Self-pay | Admitting: Family Medicine

## 2018-12-25 DIAGNOSIS — Z853 Personal history of malignant neoplasm of breast: Secondary | ICD-10-CM | POA: Diagnosis not present

## 2018-12-25 DIAGNOSIS — R922 Inconclusive mammogram: Secondary | ICD-10-CM | POA: Diagnosis not present

## 2019-01-23 ENCOUNTER — Encounter: Payer: Self-pay | Admitting: Family Medicine

## 2019-01-23 ENCOUNTER — Other Ambulatory Visit: Payer: Self-pay

## 2019-01-23 ENCOUNTER — Ambulatory Visit (INDEPENDENT_AMBULATORY_CARE_PROVIDER_SITE_OTHER): Payer: Medicare Other | Admitting: Family Medicine

## 2019-01-23 VITALS — BP 122/70 | HR 101 | Temp 98.0°F | Wt 206.0 lb

## 2019-01-23 DIAGNOSIS — Z0000 Encounter for general adult medical examination without abnormal findings: Secondary | ICD-10-CM

## 2019-01-23 DIAGNOSIS — R739 Hyperglycemia, unspecified: Secondary | ICD-10-CM

## 2019-01-23 LAB — HEPATIC FUNCTION PANEL
ALT: 54 U/L — ABNORMAL HIGH (ref 0–35)
AST: 71 U/L — ABNORMAL HIGH (ref 0–37)
Albumin: 4.6 g/dL (ref 3.5–5.2)
Alkaline Phosphatase: 125 U/L — ABNORMAL HIGH (ref 39–117)
Bilirubin, Direct: 0.2 mg/dL (ref 0.0–0.3)
Total Bilirubin: 0.8 mg/dL (ref 0.2–1.2)
Total Protein: 7 g/dL (ref 6.0–8.3)

## 2019-01-23 LAB — CBC WITH DIFFERENTIAL/PLATELET
Basophils Absolute: 0.1 10*3/uL (ref 0.0–0.1)
Basophils Relative: 0.9 % (ref 0.0–3.0)
Eosinophils Absolute: 0.5 10*3/uL (ref 0.0–0.7)
Eosinophils Relative: 5.1 % — ABNORMAL HIGH (ref 0.0–5.0)
HCT: 42.9 % (ref 36.0–46.0)
Hemoglobin: 14.6 g/dL (ref 12.0–15.0)
Lymphocytes Relative: 24.2 % (ref 12.0–46.0)
Lymphs Abs: 2.2 10*3/uL (ref 0.7–4.0)
MCHC: 33.9 g/dL (ref 30.0–36.0)
MCV: 99.9 fl (ref 78.0–100.0)
Monocytes Absolute: 0.9 10*3/uL (ref 0.1–1.0)
Monocytes Relative: 9.7 % (ref 3.0–12.0)
Neutro Abs: 5.6 10*3/uL (ref 1.4–7.7)
Neutrophils Relative %: 60.1 % (ref 43.0–77.0)
Platelets: 204 10*3/uL (ref 150.0–400.0)
RBC: 4.29 Mil/uL (ref 3.87–5.11)
RDW: 12.8 % (ref 11.5–15.5)
WBC: 9.3 10*3/uL (ref 4.0–10.5)

## 2019-01-23 LAB — POC URINALSYSI DIPSTICK (AUTOMATED)
Bilirubin, UA: NEGATIVE
Blood, UA: NEGATIVE
Glucose, UA: NEGATIVE
Ketones, UA: NEGATIVE
Nitrite, UA: NEGATIVE
Protein, UA: NEGATIVE
Spec Grav, UA: 1.015 (ref 1.010–1.025)
Urobilinogen, UA: 0.2 E.U./dL
pH, UA: 6 (ref 5.0–8.0)

## 2019-01-23 LAB — BASIC METABOLIC PANEL
BUN: 16 mg/dL (ref 6–23)
CO2: 29 mEq/L (ref 19–32)
Calcium: 10.2 mg/dL (ref 8.4–10.5)
Chloride: 97 mEq/L (ref 96–112)
Creatinine, Ser: 0.87 mg/dL (ref 0.40–1.20)
GFR: 62.78 mL/min (ref >60.00)
Glucose, Bld: 136 mg/dL — ABNORMAL HIGH (ref 70–99)
Potassium: 4.5 mEq/L (ref 3.5–5.1)
Sodium: 139 mEq/L (ref 135–145)

## 2019-01-23 LAB — LIPID PANEL
Cholesterol: 180 mg/dL (ref 0–200)
HDL: 54.8 mg/dL (ref >39.00)
LDL Cholesterol: 87 mg/dL (ref 0–99)
NonHDL: 125.59
Total CHOL/HDL Ratio: 3
Triglycerides: 193 mg/dL — ABNORMAL HIGH (ref 0.0–149.0)
VLDL: 38.6 mg/dL (ref 0.0–40.0)

## 2019-01-23 LAB — TSH: TSH: 0.83 u[IU]/mL (ref 0.35–4.50)

## 2019-01-23 LAB — HEMOGLOBIN A1C: Hgb A1c MFr Bld: 7.2 % — ABNORMAL HIGH (ref 4.6–6.5)

## 2019-01-23 MED ORDER — POTASSIUM CHLORIDE CRYS ER 10 MEQ PO TBCR: EXTENDED_RELEASE_TABLET | ORAL | 3 refills | Status: DC

## 2019-01-23 NOTE — Progress Notes (Signed)
   Subjective:    Patient ID: Connie Lang, female    DOB: 1941-06-15, 78 y.o.   MRN: [id number]  HPI Here with her husband for a well exam. She feels well except for worsening macular degeneration. She cannot go anywhere without her husband being with her.    Review of Systems  Constitutional: Negative.   HENT: Negative.   Eyes: Positive for visual disturbance.  Respiratory: Negative.   Cardiovascular: Negative.   Gastrointestinal: Negative.   Genitourinary: Negative for decreased urine volume, difficulty urinating, dyspareunia, dysuria, enuresis, flank pain, frequency, hematuria, pelvic pain and urgency.  Musculoskeletal: Negative.   Skin: Negative.   Neurological: Negative.   Psychiatric/Behavioral: Negative.        Objective:   Physical Exam Constitutional:      General: She is not in acute distress.    Appearance: She is well-developed. She is obese.  HENT:     Head: Normocephalic and atraumatic.     Right Ear: External ear normal.     Left Ear: External ear normal.     Nose: Nose normal.     Mouth/Throat:     Pharynx: No oropharyngeal exudate.  Eyes:     General: No scleral icterus.    Conjunctiva/sclera: Conjunctivae normal.     Pupils: Pupils are equal, round, and reactive to light.  Neck:     Musculoskeletal: Normal range of motion and neck supple.     Thyroid: No thyromegaly.     Vascular: No JVD.  Cardiovascular:     Rate and Rhythm: Normal rate and regular rhythm.     Heart sounds: Normal heart sounds. No murmur. No friction rub. No gallop.   Pulmonary:     Effort: Pulmonary effort is normal. No respiratory distress.     Breath sounds: Normal breath sounds. No wheezing or rales.  Chest:     Chest wall: No tenderness.  Abdominal:     General: Bowel sounds are normal. There is no distension.     Palpations: Abdomen is soft. There is no mass.     Tenderness: There is no abdominal tenderness. There is no guarding or rebound.  Musculoskeletal:  Normal range of motion.        General: No tenderness.  Lymphadenopathy:     Cervical: No cervical adenopathy.  Skin:    General: Skin is warm and dry.     Findings: No erythema or rash.  Neurological:     Mental Status: She is alert and oriented to person, place, and time.     Cranial Nerves: No cranial nerve deficit.     Motor: No abnormal muscle tone.     Coordination: Coordination normal.     Deep Tendon Reflexes: Reflexes are normal and symmetric. Reflexes normal.  Psychiatric:        Behavior: Behavior normal.        Thought Content: Thought content normal.        Judgment: Judgment normal.           Assessment & Plan:  Well exam. We discussed diet and exercise. Get fasting labs.  Alysia Penna, MD

## 2019-03-06 ENCOUNTER — Other Ambulatory Visit: Payer: Self-pay | Admitting: Family Medicine

## 2019-04-11 DIAGNOSIS — H401131 Primary open-angle glaucoma, bilateral, mild stage: Secondary | ICD-10-CM | POA: Diagnosis not present

## 2019-04-17 DIAGNOSIS — M25512 Pain in left shoulder: Secondary | ICD-10-CM | POA: Diagnosis not present

## 2019-04-17 DIAGNOSIS — M25511 Pain in right shoulder: Secondary | ICD-10-CM | POA: Diagnosis not present

## 2019-04-17 DIAGNOSIS — M19011 Primary osteoarthritis, right shoulder: Secondary | ICD-10-CM | POA: Diagnosis not present

## 2019-04-17 DIAGNOSIS — M19012 Primary osteoarthritis, left shoulder: Secondary | ICD-10-CM | POA: Diagnosis not present

## 2019-04-18 DIAGNOSIS — D0511 Intraductal carcinoma in situ of right breast: Secondary | ICD-10-CM | POA: Diagnosis not present

## 2019-05-10 ENCOUNTER — Other Ambulatory Visit: Payer: Self-pay

## 2019-05-10 ENCOUNTER — Ambulatory Visit (INDEPENDENT_AMBULATORY_CARE_PROVIDER_SITE_OTHER): Payer: Medicare Other

## 2019-05-10 DIAGNOSIS — Z23 Encounter for immunization: Secondary | ICD-10-CM

## 2019-06-10 ENCOUNTER — Other Ambulatory Visit: Payer: Self-pay | Admitting: Family Medicine

## 2019-06-12 ENCOUNTER — Ambulatory Visit: Payer: Self-pay

## 2019-06-12 NOTE — Telephone Encounter (Signed)
Patient called stating that she has a sharp abdominal pain that starts upper rt side just at her ribs and travels into her back.  She rates the pain as severe 8-10.  She named multiple pain medications that she takes for other reasons that she has tried with minimal relief.  She states the pain is most severe with movement. She has been unable to sleep at night. She denies injury. No nausea vomiting or diarrhea.  She states she took a laxative and she is not constipated.  Eating does not bring on the pain. She states that she has had multiple back issues and surgeries. Never had severe abdominal pain. Per protocol patient will go to ER for evaluation. Care advice read to patient.  She verbalized understanding.  Reason for Disposition . Patient sounds very sick or weak to the triager  Answer Assessment - Initial Assessment Questions 1. LOCATION: "Where does it hurt?"      Rt side upper under or near fib 2. RADIATION: "Does the pain shoot anywhere else?" (e.g., chest, back)    To back 3. ONSET: "When did the pain begin?" (e.g., minutes, hours or days ago)     Weeks ago 4. SUDDEN: "Gradual or sudden onset?"     Sudden  5. PATTERN "Does the pain come and go, or is it constant?"    - If constant: "Is it getting better, staying the same, or worsening?"      (Note: Constant means the pain never goes away completely; most serious pain is constant and it progresses)     - If intermittent: "How long does it last?" "Do you have pain now?"     (Note: Intermittent means the pain goes away completely between bouts)     If still does not hurt 6. SEVERITY: "How bad is the pain?"  (e.g., Scale 1-10; mild, moderate, or severe)    - MILD (1-3): doesn't interfere with normal activities, abdomen soft and not tender to touch     - MODERATE (4-7): interferes with normal activities or awakens from sleep, tender to touch     - SEVERE (8-10): excruciating pain, doubled over, unable to do any normal activities    8-10 7. RECURRENT SYMPTOM: "Have you ever had this type of abdominal pain before?" If so, ask: "When was the last time?" and "What happened that time?"     no 8. AGGRAVATING FACTORS: "Does anything seem to cause this pain?" (e.g., foods, stress, alcohol)    Non of these movement makes it worse 9. CARDIAC SYMPTOMS: "Do you have any of the following symptoms: chest pain, difficulty breathing, sweating, nausea?"    no 10. OTHER SYMPTOMS: "Do you have any other symptoms?" (e.g., fever, vomiting, diarrhea)     no 11. PREGNANCY: "Is there any chance you are pregnant?" "When was your last menstrual period?"      N/A  Protocols used: ABDOMINAL PAIN - UPPER-A-AH

## 2019-06-12 NOTE — Telephone Encounter (Signed)
FYI

## 2019-06-13 ENCOUNTER — Other Ambulatory Visit: Payer: Self-pay

## 2019-06-13 ENCOUNTER — Emergency Department (HOSPITAL_COMMUNITY)
Admission: EM | Admit: 2019-06-13 | Discharge: 2019-06-13 | Disposition: A | Discharge status: Home / Self Care | Payer: Medicare Other | Attending: Emergency Medicine | Admitting: Emergency Medicine

## 2019-06-13 ENCOUNTER — Encounter (HOSPITAL_COMMUNITY): Payer: Self-pay | Admitting: Emergency Medicine

## 2019-06-13 ENCOUNTER — Emergency Department (HOSPITAL_COMMUNITY): Payer: Medicare Other

## 2019-06-13 DIAGNOSIS — I10 Essential (primary) hypertension: Secondary | ICD-10-CM | POA: Diagnosis not present

## 2019-06-13 DIAGNOSIS — Z853 Personal history of malignant neoplasm of breast: Secondary | ICD-10-CM | POA: Diagnosis not present

## 2019-06-13 DIAGNOSIS — Z87891 Personal history of nicotine dependence: Secondary | ICD-10-CM | POA: Diagnosis not present

## 2019-06-13 DIAGNOSIS — Z79899 Other long term (current) drug therapy: Secondary | ICD-10-CM | POA: Insufficient documentation

## 2019-06-13 DIAGNOSIS — R079 Chest pain, unspecified: Secondary | ICD-10-CM | POA: Insufficient documentation

## 2019-06-13 DIAGNOSIS — Z85828 Personal history of other malignant neoplasm of skin: Secondary | ICD-10-CM | POA: Insufficient documentation

## 2019-06-13 DIAGNOSIS — N390 Urinary tract infection, site not specified: Secondary | ICD-10-CM | POA: Diagnosis not present

## 2019-06-13 DIAGNOSIS — R109 Unspecified abdominal pain: Secondary | ICD-10-CM | POA: Diagnosis not present

## 2019-06-13 DIAGNOSIS — R0781 Pleurodynia: Secondary | ICD-10-CM | POA: Diagnosis not present

## 2019-06-13 DIAGNOSIS — Z96643 Presence of artificial hip joint, bilateral: Secondary | ICD-10-CM | POA: Insufficient documentation

## 2019-06-13 DIAGNOSIS — R1011 Right upper quadrant pain: Secondary | ICD-10-CM | POA: Diagnosis present

## 2019-06-13 LAB — COMPREHENSIVE METABOLIC PANEL
ALT: 51 U/L — ABNORMAL HIGH (ref 0–44)
AST: 66 U/L — ABNORMAL HIGH (ref 15–41)
Albumin: 3.3 g/dL — ABNORMAL LOW (ref 3.5–5.0)
Alkaline Phosphatase: 119 U/L (ref 38–126)
Anion gap: 12 (ref 5–15)
BUN: 11 mg/dL (ref 8–23)
CO2: 26 mmol/L (ref 22–32)
Calcium: 9.2 mg/dL (ref 8.9–10.3)
Chloride: 101 mmol/L (ref 98–111)
Creatinine, Ser: 0.75 mg/dL (ref 0.44–1.00)
GFR calc Af Amer: >60 mL/min (ref >60)
GFR calc non Af Amer: >60 mL/min (ref >60)
Glucose, Bld: 148 mg/dL — ABNORMAL HIGH (ref 70–99)
Potassium: 3.7 mmol/L (ref 3.5–5.1)
Sodium: 139 mmol/L (ref 135–145)
Total Bilirubin: 1 mg/dL (ref 0.3–1.2)
Total Protein: 7 g/dL (ref 6.5–8.1)

## 2019-06-13 LAB — URINALYSIS, ROUTINE W REFLEX MICROSCOPIC
Bilirubin Urine: NEGATIVE
Glucose, UA: NEGATIVE mg/dL
Hgb urine dipstick: NEGATIVE
Ketones, ur: NEGATIVE mg/dL
Nitrite: NEGATIVE
Protein, ur: 30 mg/dL — AB
Specific Gravity, Urine: 1.018 (ref 1.005–1.030)
WBC, UA: >50 WBC/hpf — ABNORMAL HIGH (ref 0–5)
pH: 6 (ref 5.0–8.0)

## 2019-06-13 LAB — CBC
HCT: 40.4 % (ref 36.0–46.0)
Hemoglobin: 13.6 g/dL (ref 12.0–15.0)
MCH: 33.7 pg (ref 26.0–34.0)
MCHC: 33.7 g/dL (ref 30.0–36.0)
MCV: 100.2 fL — ABNORMAL HIGH (ref 80.0–100.0)
Platelets: 243 10*3/uL (ref 150–400)
RBC: 4.03 MIL/uL (ref 3.87–5.11)
RDW: 13 % (ref 11.5–15.5)
WBC: 10 10*3/uL (ref 4.0–10.5)
nRBC: 0 % (ref 0.0–0.2)

## 2019-06-13 LAB — TROPONIN I (HIGH SENSITIVITY)
Troponin I (High Sensitivity): 10 ng/L (ref <18)
Troponin I (High Sensitivity): 10 ng/L (ref <18)

## 2019-06-13 LAB — LIPASE, BLOOD: Lipase: 27 U/L (ref 11–51)

## 2019-06-13 MED ORDER — HYDROCODONE-ACETAMINOPHEN 5-325 MG PO TABS: 1.0000 | ORAL_TABLET | ORAL | 0 refills | Status: DC | PRN

## 2019-06-13 MED ORDER — SODIUM CHLORIDE 0.9% FLUSH
3.0000 mL | Freq: Once | INTRAVENOUS | Status: AC
  Administered 2019-06-13: 3 mL via INTRAVENOUS

## 2019-06-13 MED ORDER — CEPHALEXIN 500 MG PO CAPS: 500.0000 mg | ORAL_CAPSULE | Freq: Four times a day (QID) | ORAL | 0 refills | Status: DC

## 2019-06-13 NOTE — ED Notes (Signed)
Patient transported to CT 

## 2019-06-13 NOTE — Discharge Instructions (Addendum)
The testing indicates that you have a urinary tract infection.  This may be causing your right flank pain.  To help the pain, use a heating pad on it 3-4 times a day.  We sent prescriptions to your pharmacy for pain relief and an antibiotic.  Follow-up with your doctor for checkup next week and have repeat testing on your urine.  Return here, if needed, for problems.

## 2019-06-13 NOTE — ED Notes (Signed)
Patient transported to X-ray 

## 2019-06-13 NOTE — ED Provider Notes (Signed)
MSE was initiated and I personally evaluated the patient and placed orders (if any) at  10:35 AM on June 13, 2019.  She presents for evaluation of chest pain. I saw her briefly at triage because of the question of an abnormal EKG. EKG was compared by me, with a prior EKG in the system and does not appear different today.  Patient describes her chest pain has been present for 2 weeks, beneath the right breast. Initially it was only anterior, but now has spread posterior. The pain has been coming and going. She is unable to specify how long the pain has been there with each episode. She denies fever, cough, shortness of breath.  Chest is nontender to palpation. Heart rate is regular, normal rate. Lungs clear anteriorly and posteriorly.   EKG Interpretation  Date/Time:  Wednesday June 13 2019 10:23:09 EST Ventricular Rate:  92 PR Interval:  220 QRS Duration: 152 QT Interval:  400 QTC Calculation: 494 R Axis:   -58 Text Interpretation: Sinus rhythm with 1st degree A-V block Left axis deviation Left bundle branch block Abnormal ECG since last tracing no significant change Confirmed by Daleen Bo [phone]) on 06/13/2019 10:36:57 AM       The patient appears stable so that the remainder of the MSE may be completed by another provider.   Daleen Bo, MD 06/13/19 1038

## 2019-06-13 NOTE — ED Provider Notes (Signed)
[facility] EMERGENCY DEPARTMENT Provider Note   CSN: [id number] Arrival date & time: 06/13/19  9833     History   Chief Complaint Chief Complaint  Patient presents with   Abdominal Pain    HPI Connie Lang is a 78 y.o. female.     HPI   She presents for evaluation of chest discomfort, right anterior chest, which started 2 weeks ago and is described as "sharp like a knife."  The pain has been persistent, happening daily, and is episodic.  She is unable to describe how long the periods of pain last.  The pain has become worse, and that it has radiated to the right posterior upper back as well.  There is no associated shortness of breath, fever, chills, cough, diaphoresis, nausea, vomiting, weakness or dizziness.  No known exposure to COVID-19.  No prior similar problems.  There are no other known modifying factors.   Past Medical History:  Diagnosis Date   Allergy    occasionally has hayfever   Anxiety    Breast cancer ([place]) 2017   Cancer ([place]) 1987   cancerous cells in vaginal wall seemed to be metastatic from the gut but no primary was ever found    Cataract    Chicken pox    Constipation    Depression    DJD (degenerative joint disease)    Dysrhythmia    GERD (gastroesophageal reflux disease)    Glaucoma    glaucoma and cataracts sees Dr Satira Sark    Hyperlipemia    Hypertension    pt denies currently. " Prednisone made it high."   Lumbar disc disease    sees Dr. Sherwood Gambler    Osteoarthritis    Osteopenia    Personal history of radiation therapy    PMR (polymyalgia rheumatica) ([place])    sees Dr. Leigh Aurora    Radiation    02-25-16 last radiation for breast cancer   Transfusion history    blood     Patient Active Problem List   Diagnosis Date Noted   Osteoarthritis of left hip 11/11/2016   Family history of ovarian cancer 01/27/2016   Family history of colon cancer 01/27/2016   Breast cancer of upper-outer  quadrant of right female breast ([place]) 12/03/2015   Generalized edema 11/21/2015   PMR (polymyalgia rheumatica) ([place]) 07/23/2015   RBBB (right bundle branch block) 11/01/2013   HTN (hypertension) 11/01/2013   Obesity, unspecified 11/01/2013   Pure hypercholesterolemia 11/01/2013   CARCINOMA, BASAL CELL 07/29/2009   Hyperlipemia, mixed 07/29/2009   GLAUCOMA 07/29/2009   Essential hypertension 07/29/2009   Osteoarthritis 07/29/2009   COLONIC POLYPS, HX OF 07/29/2009    Past Surgical History:  Procedure Laterality Date   ABDOMINAL HYSTERECTOMY     TAH BSO 1985   APPENDECTOMY     BASAL CELL CARCINOMA EXCISION     nose and rt thigh   BREAST BIOPSY      x 4   BREAST EXCISIONAL BIOPSY Right 1997   BREAST EXCISIONAL BIOPSY Left 1985   BREAST EXCISIONAL BIOPSY Left 1965   BREAST EXCISIONAL BIOPSY Right 1959   BREAST LUMPECTOMY Right 12/25/2015   BREAST LUMPECTOMY  02/27/2018   see report   BREAST LUMPECTOMY WITH RADIOACTIVE SEED LOCALIZATION Right 12/25/2015   Procedure: RIGHT BREAST LUMPECTOMY WITH RADIOACTIVE SEED LOCALIZATION;  Surgeon: Rolm Bookbinder, MD;  Location: [place];  Service: General;  Laterality: Right;   CATARACT EXTRACTION     01-27-2011 left, 02-17-2011 right  CERVICAL LAMINECTOMY     1983   cervical microdiskectomy     1990 Dr Jovita Gamma   CESAREAN SECTION     COLONOSCOPY  03/21/2018   per Dr. Silverio Decamp, adenomatous polyps, no repeats due to age    [place] cataracts with lens implant   FOOT ARTHRODESIS, MODIFIED MCBRIDE     LUMBAR LAMINECTOMY     1990 Dr Jovita Gamma   [facility]     POLYPECTOMY     REFRACTIVE SURGERY     01-29-2009 04-07-2009 Dr Foye Clock in [place]   spinal injection     07-30-2012, 09-24-2012   TENDON REPAIR     rt wrist   TOTAL HIP ARTHROPLASTY Right 05/13/2016   Procedure: RIGHT TOTAL HIP ARTHROPLASTY ANTERIOR APPROACH;  Surgeon: Rod Can, MD;  Location:  [facility];  Service: Orthopedics;  Laterality: Right;  N[place]ds RNFA   TOTAL HIP ARTHROPLASTY Left 11/11/2016   Procedure: LEFT TOTAL HIP ARTHROPLASTY ANTERIOR APPROACH;  Surgeon: Rod Can, MD;  Location: [facility];  Service: Orthopedics;  Laterality: Left;  Dr. requesting RNFA   VAGINAL BIRTH AFTER CESAREAN SECTION     x3     OB History   No obstetric history on file.      Home Medications    Prior to Admission medications   Medication Sig Start Date End Date Taking? Authorizing Provider  amLODipine (NORVASC) 5 MG tablet TAKE 1 TABLET BY MOUTH ONCE DAILY IN THE EVENING 06/12/19   Laurey Morale, MD  aspirin 81 MG chewable tablet Chew 1 tablet (81 mg total) by mouth 2 (two) times daily. 11/12/16   Swinteck, Aaron Edelman, MD  Calcium Carb-Cholecalciferol (CALCIUM 600+D) 600-800 MG-UNIT TABS Take 1 tablet by mouth 2 (two) times daily.    [provider]  cephALEXin (KEFLEX) 500 MG capsule Take 1 capsule (500 mg total) by mouth 4 (four) times daily. 06/13/19   Daleen Bo, MD  chlorpheniramine (CHLOR-TRIMETON) 4 MG tablet Take 4 mg by mouth daily as needed for allergies.    [provider]  cyclobenzaprine (FLEXERIL) 10 MG tablet TAKE 1 TABLET BY MOUTH THREE TIMES DAILY AS NEEDED FOR MUSCLE SPASM 09/13/18   Laurey Morale, MD  Dextromethorphan-Guaifenesin (MUCUS RELIEF DM COUGH) 20-400 MG TABS Take 1-2 tablets by mouth 2 (two) times daily as needed (for cough/congestion).    [provider]  diphenhydrAMINE (BENADRYL) 12.5 MG chewable tablet Chew 12.5 mg by mouth 2 (two) times daily as needed (for allergic reaction (lip swelling)).    [provider]  dorzolamide (TRUSOPT) 2 % ophthalmic solution Place 1 drop into both eyes 2 (two) times daily.    [provider]  furosemide (LASIX) 40 MG tablet Take 1 tablet by mouth twice daily 06/12/19   Laurey Morale, MD  HYDROcodone-acetaminophen (NORCO/VICODIN) 5-325 MG tablet Take 1 tablet by mouth every 4 (four)  hours as needed for moderate pain. 06/13/19   Daleen Bo, MD  ibuprofen (ADVIL,MOTRIN) 200 MG tablet Take 400 mg by mouth every 4 (four) hours as needed (for pain).    [provider]  losartan (COZAAR) 50 MG tablet Take 1 tablet by mouth once daily 06/12/19   Laurey Morale, MD  Multiple Vitamin (MULTIVITAMIN WITH MINERALS) TABS tablet Take 1 tablet by mouth daily at 3 pm. One-A-Day 65+     [provider]  Multiple Vitamins-Minerals (PRESERVISION AREDS 2 PO) Take 1 tablet by mouth 2 (two) times daily.  [provider]  nabumetone (RELAFEN) 500 MG tablet Take 1 tablet (500 mg total) by mouth 2 (two) times daily. 08/03/17   Laurey Morale, MD  NON FORMULARY     [provider]  Omega 3 1200 MG CAPS Take 1,200 mg by mouth daily at 3 pm.    [provider]  oxymetazoline (AFRIN) 0.05 % nasal spray Place 1 spray into both nostrils 2 (two) times daily as needed for congestion.    [provider]  pantoprazole (PROTONIX) 40 MG tablet Take 1 tablet by mouth once daily 06/12/19   Laurey Morale, MD  potassium chloride (K-DUR) 10 MEQ tablet TAKE 2 TABLETS BY MOUTH ONCE DAILY 01/23/19   Laurey Morale, MD  simvastatin (ZOCOR) 20 MG tablet TAKE 1 TABLET BY MOUTH AT BEDTIME 06/12/19   Laurey Morale, MD  TRAVATAN Z 0.004 % SOLN ophthalmic solution Place 1 drop into both eyes at bedtime. 12/07/18   Laurey Morale, MD  vitamin B-12 (CYANOCOBALAMIN) 1000 MCG tablet Take 2,000 mcg by mouth daily at 3 pm.    [provider]  Calcium Carbonate-Vit D-Min 1200-1000 MG-UNIT CHEW Chew 1 tablet by mouth daily.    09/16/11  [provider]    Family History Family History  Problem Relation Age of Onset   Alzheimer's disease Father    Heart failure Father        d. 62   Stroke Mother 42   Cancer Sister        ovarian   Alzheimer's disease Paternal Aunt    Breast cancer Paternal Aunt    Colon cancer Brother        dx. 44s   Stroke  Maternal Aunt        d. 70s   Heart disease Maternal Grandfather    Diabetes Maternal Grandfather    Stroke Paternal Grandmother 14   Heart attack Paternal Grandfather        d. 68s   Ovarian cancer Sister        dx. 72s; no genetic testing   Leukemia Grandchild 21   Skin cancer Grandchild    Cervical cancer Cousin 82       maternal 1st cousin   Alzheimer's disease Paternal Aunt        (x4) paternal aunts   Heart attack Paternal Uncle 53   Colon polyps Neg Hx    Rectal cancer Neg Hx    Stomach cancer Neg Hx     Social History Social History   Tobacco Use   Smoking status: Former Smoker    Packs/day: 0.50    Years: 36.00    Pack years: 18.00    Types: Cigarettes    Quit date: 07/05/1994    Years since quitting: 24.9   Smokeless tobacco: Never Used  Substance Use Topics   Alcohol use: Yes    Alcohol/week: 7.0 standard drinks    Types: 7 Glasses of wine per week   Drug use: No     Allergies   Patient has no known allergies.   Review of Systems Review of Systems  All other systems reviewed and are negative.    Physical Exam Updated Vital Signs BP (!) 150/75    Pulse 92    Temp 98.6 F (37 C) (Oral)    Resp 20    LMP  (LMP Unknown) Comment: full   SpO2 93%   Physical Exam Vitals signs and nursing note reviewed.  Constitutional:  General: She is not in acute distress.    Appearance: She is well-developed. She is obese. She is not ill-appearing, toxic-appearing or diaphoretic.  HENT:     Head: Normocephalic and atraumatic.     Right Ear: External ear normal.     Left Ear: External ear normal.     Nose: No congestion or rhinorrhea.     Mouth/Throat:     Mouth: Mucous membranes are dry.  Eyes:     Conjunctiva/sclera: Conjunctivae normal.     Pupils: Pupils are equal, round, and reactive to light.  Neck:     Musculoskeletal: Normal range of motion and neck supple.     Trachea: Phonation normal.  Cardiovascular:     Rate and Rhythm:  Normal rate and regular rhythm.     Heart sounds: Normal heart sounds.  Pulmonary:     Effort: Pulmonary effort is normal. No respiratory distress.     Breath sounds: Normal breath sounds. No rhonchi.  Chest:     Chest wall: No tenderness.  Abdominal:     General: There is no distension.     Palpations: Abdomen is soft. There is no mass.     Tenderness: There is no abdominal tenderness. There is no guarding.  Musculoskeletal: Normal range of motion.  Skin:    General: Skin is warm and dry.  Neurological:     Mental Status: She is alert and oriented to person, place, and time.     Cranial Nerves: No cranial nerve deficit.     Sensory: No sensory deficit.     Motor: No abnormal muscle tone.     Coordination: Coordination normal.  Psychiatric:        Mood and Affect: Mood normal.        Behavior: Behavior normal.        Thought Content: Thought content normal.        Judgment: Judgment normal.      ED Treatments / Results  Labs (all labs ordered are listed, but only abnormal results are displayed) Labs Reviewed  COMPREHENSIVE METABOLIC PANEL - Abnormal; Notable for the following components:      Result Value   Glucose, Bld 148 (*)    Albumin 3.3 (*)    AST 66 (*)    ALT 51 (*)    All other components within normal limits  CBC - Abnormal; Notable for the following components:   MCV 100.2 (*)    All other components within normal limits  URINALYSIS, ROUTINE W REFLEX MICROSCOPIC - Abnormal; Notable for the following components:   Color, Urine AMBER (*)    APPearance CLOUDY (*)    Protein, ur 30 (*)    Leukocytes,Ua LARGE (*)    WBC, UA >50 (*)    Bacteria, UA MANY (*)    Non Squamous Epithelial 0-5 (*)    All other components within normal limits  URINE CULTURE  LIPASE, BLOOD  TROPONIN I (HIGH SENSITIVITY)  TROPONIN I (HIGH SENSITIVITY)    EKG EKG Interpretation  Date/Time:  Wednesday June 13 2019 10:23:09 EST Ventricular Rate:  92 PR Interval:  220 QRS  Duration: 152 QT Interval:  400 QTC Calculation: 494 R Axis:   -58 Text Interpretation: Sinus rhythm with 1st degree A-V block Left axis deviation Left bundle branch block Abnormal ECG since last tracing no significant change Confirmed by Daleen Bo [phone]) on 06/13/2019 10:36:57 AM   Radiology Dg Chest 2 View  Result Date: 06/13/2019 CLINICAL DATA:  Stabbing pain  right anterior lower ribs 2 weeks. EXAM: CHEST - 2 VIEW COMPARISON:  09/26/2014 FINDINGS: Lungs are adequately inflated without focal airspace consolidation or effusion. Mild cardiomegaly. Degenerative change of the spine and shoulders. IMPRESSION: No acute cardiopulmonary disease. Mild cardiomegaly. Electronically Signed   By: Marin Olp M.D.   On: 06/13/2019 12:51   Ct Renal Stone Study  Result Date: 06/13/2019 CLINICAL DATA:  Right flank pain for 2 weeks. EXAM: CT ABDOMEN AND PELVIS WITHOUT CONTRAST TECHNIQUE: Multidetector CT imaging of the abdomen and pelvis was performed following the standard protocol without IV contrast. COMPARISON:  None. FINDINGS: Lower chest: Bibasilar are scarring changes but no infiltrates or effusions. The heart is mildly enlarged. Coronary artery calcifications are noted. Hepatobiliary: No focal hepatic lesions or intrahepatic biliary dilatation. Gallbladder is unremarkable. No common bile duct dilatation. Pancreas: Prominent fatty interstices but no inflammation, mass or ductal dilatation. Spleen: Normal size. No focal lesions. Adrenals/Urinary Tract: The adrenal glands are normal. No renal, ureteral or bladder calculi or mass is identified without contrast. The lower bladder is difficult to evaluate due to artifact from bilateral hip prostheses. Perinephric interstitial changes are likely due to remote inflammation or obstruction. Stomach/Bowel: The stomach, duodenum, small bowel and colon are grossly normal without oral contrast. No inflammatory changes, mass lesions or obstructive findings. Sigmoid  diverticulosis without findings for acute diverticulitis. Vascular/Lymphatic: The aorta is normal in caliber. Moderate to advanced atheroscerlotic calcifications. No mesenteric of retroperitoneal mass or adenopathy. Small scattered lymph nodes are noted. Reproductive: Surgically absent. Other: No pelvic mass or adenopathy. No free pelvic fluid collections. No inguinal mass or adenopathy. No abdominal wall hernia or subcutaneous lesions. Musculoskeletal: Bilateral hip prostheses with associated significant artifact through the lower pelvis. Moderate degenerative changes are noted at the pubic symphysis. No acute bony findings or destructive bony changes. IMPRESSION: 1. No CT findings to account for the patient's right flank pain. No renal, ureteral or bladder calculi or mass. 2. No acute abdominal/pelvic findings, mass lesions or lymphadenopathy. Electronically Signed   By: Marijo Sanes M.D.   On: 06/13/2019 14:56    Procedures Procedures (including critical care time)  Medications Ordered in ED Medications  sodium chloride flush (NS) 0.9 % injection 3 mL (3 mLs Intravenous Given 06/13/19 1120)     Initial Impression / Assessment and Plan / ED Course  I have reviewed the triage vital signs and the nursing notes.  Pertinent labs & imaging results that were available during my care of the patient were reviewed by me and considered in my medical decision making (see chart for details).  Clinical Course as of Jun 12 1609  Wed Jun 13, 2019  1149 Normal  Troponin I (High Sensitivity) [EW]  1149 Normal except glucose high, albumin low, AST high, ALT high  Comprehensive metabolic panel(!) [EW]  4097 Urinalysis :normal except presence of leukocytes, red cells, white cells, bacteria.  Urine culture ordered.   [EW]  1150 CBC: Normal   [EW]  1459 No acute abnormalities, interpreted by me  CT Renal Stone Study [EW]  1531 Normal delta troponin  Troponin I (High Sensitivity) [EW]    Clinical Course  User Index [EW] Daleen Bo, MD        Patient Vitals for the past 24 hrs:  BP Temp Temp src Pulse Resp SpO2  06/13/19 1215 (!) 150/75 -- -- 92 20 93 %  06/13/19 1130 (!) 142/77 -- -- 89 -- 91 %  06/13/19 0957 (!) 152/104 98.6 F (37 C) Oral Marland Kitchen)  102 16 94 %    3:51 PM Reevaluation with update and discussion. After initial assessment and treatment, an updated evaluation reveals she is sitting up and comfortable.  She has no further complaints.  Findings discussed and questions answered. Daleen Bo   Medical Decision Making: Right flank pain, with reassuring findings for lack of cardiac pulmonary abnormalities.  Suspect urinary tract infection as primary cause of pain.  CT imaging does not indicate renal or gallbladder disorders.  No bowel obstruction.  No intra-abdominal abnormality seen on CT imaging.  Doubt serious bacterial infection, metabolic instability or impending vascular collapse.  Connie Lang was evaluated in Emergency Department on 06/13/2019 for the symptoms described in the history of present illness. She was evaluated in the context of the global COVID-19 pandemic, which necessitated consideration that the patient might be at risk for infection with the SARS-CoV-2 virus that causes COVID-19. Institutional protocols and algorithms that pertain to the evaluation of patients at risk for COVID-19 are in a state of rapid change based on information released by regulatory bodies including the CDC and federal and state organizations. These policies and algorithms were followed during the patient's care in the ED.  CRITICAL CARE-no Performed by: Daleen Bo  Nursing Notes Reviewed/ Care Coordinated Applicable Imaging Reviewed Interpretation of Laboratory Data incorporated into ED treatment  The patient appears reasonably screened and/or stabilized for discharge and I doubt any other medical condition or other [facility] requiring further screening, evaluation, or treatment  in the ED at this time prior to discharge.  Plan: Home Medications-continue usual medications; Home Treatments-rest, fluids; return here if the recommended treatment, does not improve the symptoms; Recommended follow up-PCP checkup next week and as needed   Final Clinical Impressions(s) / ED Diagnoses   Final diagnoses:  Urinary tract infection without hematuria, site unspecified  Right flank pain    ED Discharge Orders         Ordered    HYDROcodone-acetaminophen (NORCO/VICODIN) 5-325 MG tablet  Every 4 hours PRN     06/13/19 1609    cephALEXin (KEFLEX) 500 MG capsule  4 times daily     06/13/19 1609           Daleen Bo, MD 06/13/19 1612

## 2019-06-13 NOTE — ED Notes (Signed)
Patient verbalizes understanding of discharge instructions. Opportunity for questioning and answers were provided. Armband removed by staff, pt discharged from ED to home with family

## 2019-06-13 NOTE — ED Triage Notes (Signed)
C/o RUQ pain x 2 weeks.  Denies nausea, vomiting, diarrhea, or any other associated symptoms.

## 2019-06-13 NOTE — ED Notes (Signed)
Dr. Eulis Foster at triage to assess pt due to abnormal EKG.

## 2019-06-14 ENCOUNTER — Other Ambulatory Visit: Payer: Self-pay | Admitting: Family Medicine

## 2019-06-14 LAB — URINE CULTURE: Special Requests: NORMAL

## 2019-06-19 ENCOUNTER — Encounter: Payer: Self-pay | Admitting: Family Medicine

## 2019-06-19 ENCOUNTER — Ambulatory Visit (INDEPENDENT_AMBULATORY_CARE_PROVIDER_SITE_OTHER): Payer: Medicare Other | Admitting: Family Medicine

## 2019-06-19 ENCOUNTER — Other Ambulatory Visit: Payer: Self-pay

## 2019-06-19 DIAGNOSIS — N390 Urinary tract infection, site not specified: Secondary | ICD-10-CM

## 2019-06-19 DIAGNOSIS — K148 Other diseases of tongue: Secondary | ICD-10-CM

## 2019-06-19 NOTE — Progress Notes (Signed)
   Subjective:    Patient ID: Connie Lang, female    DOB: 02-23-1941, 78 y.o.   MRN: [id number]  HPI Virtual Visit via Telephone Note  I connected with the patient on 06/19/19 at  9:00 AM EST by telephone and verified that I am speaking with the correct person using two identifiers. We attempted to connect virtually but we had technical difficulties with the audio and video.     I discussed the limitations, risks, security and privacy concerns of performing an evaluation and management service by telephone and the availability of in person appointments. I also discussed with the patient that there may be a patient responsible charge related to this service. The patient expressed understanding and agreed to proceed.  Location patient: home Location provider: work or home office Participants present for the call: patient, provider Patient did not have a visit in the prior 7 days to address this/these issue(s).   History of Present Illness: Here to follow up an ER visit on 06-13-19 when she presented with a sharp right sided chest pain. Her exam and EKG were normal. An abdominal CT was normal. She was found to have a UTI, and this was felt to be the problem. She was given a 7 day course of Keflex, and she is almost finished with this. The chest pain has resolved. Of note she never had urinary symptoms. She is drinking lots of water. Her urine culture grew multiple species, so this was not helpful. In addition she mentions a tender blistering lesion on the tongue that appeared 3 months ago. She had the ER physician look at it, and he recommended she see an ENT about it.    Observations/Objective: Patient sounds cheerful and well on the phone. I do not appreciate any SOB. Speech and thought processing are grossly intact. Patient reported vitals:  Assessment and Plan: For the UTI, she will finish up the Keflex and follow up as needed. For the tongue lesion, we will refer her to ENT.    Alysia Penna, MD   Follow Up Instructions:     [phone] 5-10 [phone] 11-20 [phone] 21-30 I did not refer this patient for an OV in the next 24 hours for this/these issue(s).  I discussed the assessment and treatment plan with the patient. The patient was provided an opportunity to ask questions and all were answered. The patient agreed with the plan and demonstrated an understanding of the instructions.   The patient was advised to call back or seek an in-person evaluation if the symptoms worsen or if the condition fails to improve as anticipated.  I provided 17 minutes of non-face-to-face time during this encounter.   Alysia Penna, MD    Review of Systems     Objective:   Physical Exam        Assessment & Plan:

## 2019-06-21 DIAGNOSIS — K146 Glossodynia: Secondary | ICD-10-CM | POA: Diagnosis not present

## 2019-06-22 ENCOUNTER — Telehealth: Payer: Self-pay | Admitting: *Deleted

## 2019-06-22 ENCOUNTER — Telehealth: Payer: Self-pay

## 2019-06-22 NOTE — Telephone Encounter (Signed)
Copied from [place] [id number]. Topic: General - Inquiry >> Jun 22, 2019  9:54 AM Mathis Bud wrote: Reason for CRM: Patient is requesting a call back from PCP nurse. Call back [phone] >> Jun 22, 2019  1:27 PM Rainey Pines A wrote: Patient stated that best contact number for nurse to call is [phone]. The other number given is not working today

## 2019-06-22 NOTE — Telephone Encounter (Signed)
Called pt to get more information but no answer.

## 2019-06-22 NOTE — Telephone Encounter (Signed)
Copied from [place] [id number]. Topic: General - Inquiry >> Jun 22, 2019  9:54 AM Mathis Bud wrote: Reason for CRM: Patient is requesting a call back from PCP nurse. Call back [phone]

## 2019-06-25 ENCOUNTER — Encounter: Payer: Self-pay | Admitting: Family Medicine

## 2019-06-25 NOTE — Telephone Encounter (Signed)
Left message for patient to call back. CRM created 

## 2019-06-26 ENCOUNTER — Telehealth: Payer: Self-pay | Admitting: *Deleted

## 2019-06-26 NOTE — Telephone Encounter (Signed)
Set up an in person OV so I can examine her

## 2019-06-26 NOTE — Telephone Encounter (Signed)
Spoke with patient and scheduled appointment with Dr. Sarajane Jews per note. Nothing further needed at this time.

## 2019-06-27 ENCOUNTER — Other Ambulatory Visit: Payer: Self-pay

## 2019-06-27 ENCOUNTER — Ambulatory Visit: Payer: Medicare Other | Admitting: Family Medicine

## 2019-06-27 ENCOUNTER — Encounter: Payer: Self-pay | Admitting: Family Medicine

## 2019-06-27 VITALS — BP 132/64 | HR 107 | Temp 97.3°F | Wt 202.2 lb

## 2019-06-27 DIAGNOSIS — R1011 Right upper quadrant pain: Secondary | ICD-10-CM

## 2019-06-27 MED ORDER — HYDROCODONE-ACETAMINOPHEN 5-325 MG PO TABS: 1.0000 | ORAL_TABLET | ORAL | 0 refills | Status: AC | PRN

## 2019-06-27 NOTE — Progress Notes (Signed)
   Subjective:    Patient ID: Connie Lang, female    DOB: 1941/03/19, 78 y.o.   MRN: [id number]  HPI Here to follow up an ER visit on 06-13-19 for RUQ abdominal pain. This had been going on for 2 weeks at that point, so now she has had this for 4 weeks. The pain waxes and wanes, but she has had it every day through this time. The pain is sharp ("like a knife") and it can be severe at times. Sometimes it radiates around to the right upper back. Eating food makes the pain worse. She gets nauseated with it but she has not vomited. No fever. No cough or SOB. Her urinations are normal and her BMs are normal. At the ER most of her labs were unremarkable. Cardiac enzymes and EKG were normal. WBC was 10.0. transaminases were mildly elevated but not above her baseline. A UA showed large leukocytes and >50 WBC. A renal stone CT scan was totally normal. She was diagnosed with a UTI and was given Keflex. She took all this but nothing changed. Norco does give her brief relief and allows her to sleep. The urine culture grew multiple species so the diagnosis of tre UTI is questionable.    Review of Systems  Constitutional: Negative.   Respiratory: Negative.   Cardiovascular: Negative.   Gastrointestinal: Positive for abdominal pain and nausea. Negative for abdominal distention, anal bleeding, blood in stool, constipation, diarrhea, rectal pain and vomiting.  Genitourinary: Negative.        Objective:   Physical Exam Constitutional:      General: She is not in acute distress.    Appearance: Normal appearance. She is obese.  Cardiovascular:     Rate and Rhythm: Normal rate and regular rhythm.     Pulses: Normal pulses.     Heart sounds: Normal heart sounds.  Pulmonary:     Effort: Pulmonary effort is normal.     Breath sounds: Normal breath sounds.  Abdominal:     General: Abdomen is flat. Bowel sounds are normal. There is no distension.     Palpations: Abdomen is soft. There is no mass.   Tenderness: There is no guarding or rebound.     Hernia: No hernia is present.     Comments: She is moderately tender over the RUQ just inferior to the rib margin. No masses or HSM.   Neurological:     Mental Status: She is alert.           Assessment & Plan:  She has RUQ pain which is most consistent with gall bladder disease. We will set her up for an abdominal US soon. She can use Norco for pain as needed. She will avoid fatty or greasy foods and dairy products. She knows to return to the ER if the pain becomes worse or if she develops a fever.  Alysia Penna, MD

## 2019-06-28 NOTE — Telephone Encounter (Signed)
Left message for patient to call back  

## 2019-07-04 ENCOUNTER — Ambulatory Visit
Admission: RE | Admit: 2019-07-04 | Discharge: 2019-07-04 | Disposition: A | Discharge status: Home / Self Care | Payer: Medicare Other | Source: Ambulatory Visit | Attending: Family Medicine | Admitting: Family Medicine

## 2019-07-04 ENCOUNTER — Other Ambulatory Visit: Payer: Self-pay | Admitting: *Deleted

## 2019-07-04 DIAGNOSIS — K760 Fatty (change of) liver, not elsewhere classified: Secondary | ICD-10-CM | POA: Diagnosis not present

## 2019-07-04 DIAGNOSIS — R1011 Right upper quadrant pain: Secondary | ICD-10-CM

## 2019-07-05 ENCOUNTER — Telehealth: Payer: Self-pay | Admitting: Family Medicine

## 2019-07-05 MED ORDER — TRAMADOL HCL 50 MG PO TABS: 100.0000 mg | ORAL_TABLET | Freq: Three times a day (TID) | ORAL | 1 refills | Status: DC | PRN

## 2019-07-05 NOTE — Telephone Encounter (Signed)
This medication is not appropriate for long term treatment of abdominal pain. I will let her try Tramadol for the pain

## 2019-07-05 NOTE — Telephone Encounter (Signed)
FYI  Pt notified of update and verbalized understanding. Pt stated she was only requesting the Rx bc she was in pain and the GI doctor cannot see her until Feb.

## 2019-07-05 NOTE — Telephone Encounter (Signed)
rx refill HYDROcodone-acetaminophen (NORCO/VICODIN) 5-325 MG tablet [[id number] [place], [place] [address] Phone:  [phone]  Fax:  [phone]

## 2019-07-09 NOTE — Telephone Encounter (Signed)
Unable to reach patient. Message will be closed.

## 2019-07-09 NOTE — Telephone Encounter (Signed)
I understand. I think the Tramadol will work just as well.

## 2019-07-09 NOTE — Telephone Encounter (Signed)
Noted. Patient is aware. 

## 2019-07-17 DIAGNOSIS — M47814 Spondylosis without myelopathy or radiculopathy, thoracic region: Secondary | ICD-10-CM | POA: Diagnosis not present

## 2019-07-17 DIAGNOSIS — M5136 Other intervertebral disc degeneration, lumbar region: Secondary | ICD-10-CM | POA: Diagnosis not present

## 2019-07-17 DIAGNOSIS — M5414 Radiculopathy, thoracic region: Secondary | ICD-10-CM | POA: Diagnosis not present

## 2019-07-17 DIAGNOSIS — M5134 Other intervertebral disc degeneration, thoracic region: Secondary | ICD-10-CM | POA: Diagnosis not present

## 2019-07-19 DIAGNOSIS — M5114 Intervertebral disc disorders with radiculopathy, thoracic region: Secondary | ICD-10-CM | POA: Diagnosis not present

## 2019-07-19 DIAGNOSIS — M5414 Radiculopathy, thoracic region: Secondary | ICD-10-CM | POA: Diagnosis not present

## 2019-07-20 ENCOUNTER — Other Ambulatory Visit (HOSPITAL_COMMUNITY): Payer: Self-pay | Admitting: Neurosurgery

## 2019-07-20 DIAGNOSIS — D492 Neoplasm of unspecified behavior of bone, soft tissue, and skin: Secondary | ICD-10-CM | POA: Diagnosis not present

## 2019-07-20 DIAGNOSIS — M5414 Radiculopathy, thoracic region: Secondary | ICD-10-CM | POA: Diagnosis not present

## 2019-07-20 DIAGNOSIS — I10 Essential (primary) hypertension: Secondary | ICD-10-CM | POA: Diagnosis not present

## 2019-07-20 DIAGNOSIS — C50919 Malignant neoplasm of unspecified site of unspecified female breast: Secondary | ICD-10-CM | POA: Diagnosis not present

## 2019-07-23 ENCOUNTER — Encounter (HOSPITAL_COMMUNITY): Payer: Self-pay | Admitting: Radiology

## 2019-07-23 ENCOUNTER — Ambulatory Visit: Payer: Medicare Other | Admitting: Physician Assistant

## 2019-07-23 NOTE — Progress Notes (Signed)
Francetta Found Reasner Female, 79 y.o., 1941-05-17 MRN:  [id number] Phone:  [phone] (H) PCP:  Laurey Morale, MD Coverage:  Sherre Poot [facility] Medicare/[employer] Medicare  FW: Biopsy Attn: Dr Laurence Ferrari Received: 3 days ago Message Contents  Donn Pierini D  Please schedule Monday       Previous Messages   ----- Message -----  From: Arne Cleveland, MD  Sent: 07/20/2019  5:00 PM EST  To: Lenore Cordia  Subject: RE: Biopsy Attn: Dr Ceasar Mons   CT core paraspinal component of T8 mass   DDH   ----- Message -----  From: Lenore Cordia  Sent: 07/20/2019  4:45 PM EST  To: Jillyn Hidden, Ir Procedure Requests  Subject: Biopsy Attn: Dr Laurence Ferrari            Procedure Requested: CT Biopsy    Reason for Procedure:  Biopsy of right side of T8 malignant mass by intervention radiology STAT C50.Marland Kitchen40 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

## 2019-07-25 ENCOUNTER — Other Ambulatory Visit: Payer: Self-pay | Admitting: Radiology

## 2019-07-27 ENCOUNTER — Other Ambulatory Visit: Payer: Self-pay

## 2019-07-27 ENCOUNTER — Telehealth: Payer: Self-pay | Admitting: Family Medicine

## 2019-07-27 ENCOUNTER — Ambulatory Visit (HOSPITAL_COMMUNITY)
Admission: RE | Admit: 2019-07-27 | Discharge: 2019-07-27 | Disposition: A | Discharge status: Home / Self Care | Payer: Medicare Other | Source: Ambulatory Visit | Attending: Neurosurgery | Admitting: Neurosurgery

## 2019-07-27 DIAGNOSIS — M353 Polymyalgia rheumatica: Secondary | ICD-10-CM | POA: Insufficient documentation

## 2019-07-27 DIAGNOSIS — C50919 Malignant neoplasm of unspecified site of unspecified female breast: Secondary | ICD-10-CM

## 2019-07-27 DIAGNOSIS — K219 Gastro-esophageal reflux disease without esophagitis: Secondary | ICD-10-CM | POA: Diagnosis not present

## 2019-07-27 DIAGNOSIS — I10 Essential (primary) hypertension: Secondary | ICD-10-CM | POA: Diagnosis not present

## 2019-07-27 DIAGNOSIS — Z823 Family history of stroke: Secondary | ICD-10-CM | POA: Insufficient documentation

## 2019-07-27 DIAGNOSIS — Z853 Personal history of malignant neoplasm of breast: Secondary | ICD-10-CM | POA: Insufficient documentation

## 2019-07-27 DIAGNOSIS — M1990 Unspecified osteoarthritis, unspecified site: Secondary | ICD-10-CM | POA: Diagnosis not present

## 2019-07-27 DIAGNOSIS — M899 Disorder of bone, unspecified: Secondary | ICD-10-CM | POA: Diagnosis present

## 2019-07-27 DIAGNOSIS — Z8249 Family history of ischemic heart disease and other diseases of the circulatory system: Secondary | ICD-10-CM | POA: Insufficient documentation

## 2019-07-27 DIAGNOSIS — Z803 Family history of malignant neoplasm of breast: Secondary | ICD-10-CM | POA: Diagnosis not present

## 2019-07-27 DIAGNOSIS — H409 Unspecified glaucoma: Secondary | ICD-10-CM | POA: Insufficient documentation

## 2019-07-27 DIAGNOSIS — Z923 Personal history of irradiation: Secondary | ICD-10-CM | POA: Diagnosis not present

## 2019-07-27 DIAGNOSIS — Z7982 Long term (current) use of aspirin: Secondary | ICD-10-CM | POA: Diagnosis not present

## 2019-07-27 DIAGNOSIS — C7951 Secondary malignant neoplasm of bone: Secondary | ICD-10-CM | POA: Insufficient documentation

## 2019-07-27 DIAGNOSIS — E785 Hyperlipidemia, unspecified: Secondary | ICD-10-CM | POA: Diagnosis not present

## 2019-07-27 DIAGNOSIS — Z8041 Family history of malignant neoplasm of ovary: Secondary | ICD-10-CM | POA: Insufficient documentation

## 2019-07-27 DIAGNOSIS — Z800 Family history of malignant neoplasm of digestive organs: Secondary | ICD-10-CM | POA: Insufficient documentation

## 2019-07-27 DIAGNOSIS — Z79899 Other long term (current) drug therapy: Secondary | ICD-10-CM | POA: Insufficient documentation

## 2019-07-27 DIAGNOSIS — M898X8 Other specified disorders of bone, other site: Secondary | ICD-10-CM | POA: Diagnosis not present

## 2019-07-27 LAB — CBC
HCT: 40.7 % (ref 36.0–46.0)
Hemoglobin: 13.5 g/dL (ref 12.0–15.0)
MCH: 33.1 pg (ref 26.0–34.0)
MCHC: 33.2 g/dL (ref 30.0–36.0)
MCV: 99.8 fL (ref 80.0–100.0)
Platelets: 272 10*3/uL (ref 150–400)
RBC: 4.08 MIL/uL (ref 3.87–5.11)
RDW: 11.6 % (ref 11.5–15.5)
WBC: 10.9 10*3/uL — ABNORMAL HIGH (ref 4.0–10.5)
nRBC: 0 % (ref 0.0–0.2)

## 2019-07-27 LAB — PROTIME-INR
INR: 1 (ref 0.8–1.2)
Prothrombin Time: 13 seconds (ref 11.4–15.2)

## 2019-07-27 MED ORDER — FENTANYL CITRATE (PF) 100 MCG/2ML IJ SOLN
INTRAMUSCULAR | Status: AC
  Filled 2019-07-27: qty 2

## 2019-07-27 MED ORDER — TRAMADOL HCL 50 MG PO TABS: 100.0000 mg | ORAL_TABLET | Freq: Four times a day (QID) | ORAL | 5 refills | Status: DC | PRN

## 2019-07-27 MED ORDER — FENTANYL CITRATE (PF) 100 MCG/2ML IJ SOLN
INTRAMUSCULAR | Status: AC | PRN
  Administered 2019-07-27: 25 ug via INTRAVENOUS
  Administered 2019-07-27: 50 ug via INTRAVENOUS
  Administered 2019-07-27 (×3): 25 ug via INTRAVENOUS

## 2019-07-27 MED ORDER — SODIUM CHLORIDE 0.9 % IV SOLN: INTRAVENOUS | Status: DC

## 2019-07-27 MED ORDER — MIDAZOLAM HCL 2 MG/2ML IJ SOLN
INTRAMUSCULAR | Status: AC | PRN
  Administered 2019-07-27: 0.5 mg via INTRAVENOUS
  Administered 2019-07-27: 1 mg via INTRAVENOUS
  Administered 2019-07-27: 0.5 mg via INTRAVENOUS

## 2019-07-27 MED ORDER — MIDAZOLAM HCL 2 MG/2ML IJ SOLN
INTRAMUSCULAR | Status: AC
  Filled 2019-07-27: qty 2

## 2019-07-27 NOTE — Telephone Encounter (Signed)
Patient's husband called in saying that his wife Connie Lang is having a biopsy on her spine, and he wanted to know if he could get her some more pain medicine.

## 2019-07-27 NOTE — Telephone Encounter (Signed)
Done

## 2019-07-27 NOTE — Telephone Encounter (Signed)
Please advise.  Tramadol last filled 06/17/2019 Last OV 06/27/2019

## 2019-07-27 NOTE — Progress Notes (Signed)
Client c/o back pain when on stretcher and got up to chair and states feels better, asked client if she would like for me to call for pain med order and states no states has this pain at home and husband is calling her medical Dr for prescription

## 2019-07-27 NOTE — Discharge Instructions (Addendum)
Needle Biopsy, Care After This sheet gives you information about how to care for yourself after your procedure. Your health care provider may also give you more specific instructions. If you have problems or questions, contact your health care provider. What can I expect after the procedure? After the procedure, it is common to have soreness, bruising, or mild pain at the puncture site. This should go away in a few days. Follow these instructions at home: Needle insertion site care   Wash your hands with soap and water before you change your bandage (dressing). If you cannot use soap and water, use hand sanitizer.  Follow instructions from your health care provider about how to take care of your puncture site. This includes: ? When and how to change your dressing. ? When to remove your dressing.  Check your puncture site every day for signs of infection. Check for: ? Redness, swelling, or pain. ? Fluid or blood. ? Pus or a bad smell. ? Warmth. General instructions  Return to your normal activities as told by your health care provider. Ask your health care provider what activities are safe for you.  Do not take baths, swim, or use a hot tub until your health care provider approves. Ask your health care provider if you may take showers. You may only be allowed to take sponge baths.  Take over-the-counter and prescription medicines only as told by your health care provider.  Keep all follow-up visits as told by your health care provider. This is important. Contact a health care provider if:  You have a fever.  You have redness, swelling, or pain at the puncture site that lasts longer than a few days.  You have fluid, blood, or pus coming from your puncture site.  Your puncture site feels warm to the touch. Get help right away if:  You have severe bleeding from the puncture site. Summary  After the procedure, it is common to have soreness, bruising, or mild pain at the puncture  site. This should go away in a few days.  Check your puncture site every day for signs of infection, such as redness, swelling, or pain.  Get help right away if you have severe bleeding from your puncture site. This information is not intended to replace advice given to you by your health care provider. Make sure you discuss any questions you have with your health care provider. Document Revised: 09/02/2017 Document Reviewed: 07/04/2017 Elsevier Patient Education  [place]. Moderate Conscious Sedation, Adult Sedation is the use of medicines to promote relaxation and relieve discomfort and anxiety. Moderate conscious sedation is a type of sedation. Under moderate conscious sedation, you are less alert than normal, but you are still able to respond to instructions, touch, or both. Moderate conscious sedation is used during short medical and dental procedures. It is milder than deep sedation, which is a type of sedation under which you cannot be easily woken up. It is also milder than general anesthesia, which is the use of medicines to make you unconscious. Moderate conscious sedation allows you to return to your regular activities sooner. Tell a health care provider about:  Any allergies you have.  All medicines you are taking, including vitamins, herbs, eye drops, creams, and over-the-counter medicines.  Use of steroids (by mouth or creams).  Any problems you or family members have had with sedatives and anesthetic medicines.  Any blood disorders you have.  Any surgeries you have had.  Any medical conditions you have, such  as sleep apnea.  Whether you are pregnant or may be pregnant.  Any use of cigarettes, alcohol, marijuana, or street drugs. What are the risks? Generally, this is a safe procedure. However, problems may occur, including:  Getting too much medicine (oversedation).  Nausea.  Allergic reaction to medicines.  Trouble breathing. If this happens, a  breathing tube may be used to help with breathing. It will be removed when you are awake and breathing on your own.  Heart trouble.  Lung trouble. What happens before the procedure? Staying hydrated Follow instructions from your health care provider about hydration, which may include:  Up to 2 hours before the procedure - you may continue to drink clear liquids, such as water, clear fruit juice, black coffee, and plain tea. Eating and drinking restrictions Follow instructions from your health care provider about eating and drinking, which may include:  8 hours before the procedure - stop eating heavy meals or foods such as meat, fried foods, or fatty foods.  6 hours before the procedure - stop eating light meals or foods, such as toast or cereal.  6 hours before the procedure - stop drinking milk or drinks that contain milk.  2 hours before the procedure - stop drinking clear liquids. Medicine Ask your health care provider about:  Changing or stopping your regular medicines. This is especially important if you are taking diabetes medicines or blood thinners.  Taking medicines such as aspirin and ibuprofen. These medicines can thin your blood. Do not take these medicines before your procedure if your health care provider instructs you not to.  Tests and exams  You will have a physical exam.  You may have blood tests done to show: ? How well your kidneys and liver are working. ? How well your blood can clot. General instructions  Plan to have someone take you home from the hospital or clinic.  If you will be going home right after the procedure, plan to have someone with you for 24 hours. What happens during the procedure?  An IV tube will be inserted into one of your veins.  Medicine to help you relax (sedative) will be given through the IV tube.  The medical or dental procedure will be performed. What happens after the procedure?  Your blood pressure, heart rate,  breathing rate, and blood oxygen level will be monitored often until the medicines you were given have worn off.  Do not drive for 24 hours. This information is not intended to replace advice given to you by your health care provider. Make sure you discuss any questions you have with your health care provider. Document Revised: 06/03/2017 Document Reviewed: 10/11/2015 Elsevier Patient Education  2020 [employer].

## 2019-07-27 NOTE — H&P (Signed)
Chief Complaint: Patient was seen in consultation today for T8 biopsy at the request of [place]  Referring Physician(s): Nudelman,Robert  Supervising Physician: Markus Daft  Patient Status: [facility] - Out-pt  History of Present Illness: Connie Lang is a 79 y.o. female with back pain found to have T8 vertebral/paravertebral lesion. She is referred for image guided biopsy. PMHx, meds, labs, imaging, allergies reviewed. Feels well, no recent fevers, chills, illness. Has been NPO today as directed.    Past Medical History:  Diagnosis Date  . Allergy    occasionally has hayfever  . Anxiety   . Breast cancer ([place]) 2017  . Cancer ([place]) 1987   cancerous cells in vaginal wall seemed to be metastatic from the gut but no primary was ever found   . Cataract   . Chicken pox   . Constipation   . Depression   . DJD (degenerative joint disease)   . Dysrhythmia   . GERD (gastroesophageal reflux disease)   . Glaucoma    glaucoma and cataracts sees Dr Satira Sark   . Hyperlipemia   . Hypertension    pt denies currently. " Prednisone made it high."  . Lumbar disc disease    sees Dr. Sherwood Gambler   . Osteoarthritis   . Osteopenia   . Personal history of radiation therapy   . PMR (polymyalgia rheumatica) (HCC)    sees Dr. Leigh Aurora   . Radiation    02-25-16 last radiation for breast cancer  . Transfusion history    blood     Past Surgical History:  Procedure Laterality Date  . ABDOMINAL HYSTERECTOMY     TAH BSO 1985  . APPENDECTOMY    . BASAL CELL CARCINOMA EXCISION     nose and rt thigh  . BREAST BIOPSY      x 4  . BREAST EXCISIONAL BIOPSY Right 1997  . BREAST EXCISIONAL BIOPSY Left 1985  . BREAST EXCISIONAL BIOPSY Left 1965  . BREAST EXCISIONAL BIOPSY Right 1959  . BREAST LUMPECTOMY Right 12/25/2015  . BREAST LUMPECTOMY  02/27/2018   see report  . BREAST LUMPECTOMY WITH RADIOACTIVE SEED LOCALIZATION Right 12/25/2015   Procedure: RIGHT BREAST LUMPECTOMY WITH  RADIOACTIVE SEED LOCALIZATION;  Surgeon: Rolm Bookbinder, MD;  Location: [place];  Service: General;  Laterality: Right;  . CATARACT EXTRACTION     01-27-2011 left, 02-17-2011 right   . CERVICAL LAMINECTOMY     1983  . cervical microdiskectomy     1990 Dr Jovita Gamma  . CESAREAN SECTION    . COLONOSCOPY  03/21/2018   per Dr. Silverio Decamp, adenomatous polyps, no repeats due to age   . EYE SURGERY     bil cataracts with lens implant  . FOOT ARTHRODESIS, MODIFIED MCBRIDE    . LUMBAR LAMINECTOMY     1990 Dr Jovita Gamma  . MYELOGRAM    . POLYPECTOMY    . REFRACTIVE SURGERY     01-29-2009 04-07-2009 Dr Foye Clock in [place]  . spinal injection     07-30-2012, 09-24-2012  . TENDON REPAIR     rt wrist  . TOTAL HIP ARTHROPLASTY Right 05/13/2016   Procedure: RIGHT TOTAL HIP ARTHROPLASTY ANTERIOR APPROACH;  Surgeon: Rod Can, MD;  Location: [facility];  Service: Orthopedics;  Laterality: Right;  [place] RNFA  . TOTAL HIP ARTHROPLASTY Left 11/11/2016   Procedure: LEFT TOTAL HIP ARTHROPLASTY ANTERIOR APPROACH;  Surgeon: Rod Can, MD;  Location: [facility];  Service: Orthopedics;  Laterality: Left;  Dr.  requesting RNFA  . VAGINAL BIRTH AFTER CESAREAN SECTION     x3    Allergies: Patient has no known allergies.  Medications: Prior to Admission medications   Medication Sig Start Date End Date Taking? Authorizing Provider  amLODipine (NORVASC) 5 MG tablet TAKE 1 TABLET BY MOUTH ONCE DAILY IN THE EVENING 06/12/19  Yes Laurey Morale, MD  aspirin 81 MG chewable tablet Chew 1 tablet (81 mg total) by mouth 2 (two) times daily. 11/12/16  Yes Swinteck, Aaron Edelman, MD  Calcium Carb-Cholecalciferol (CALCIUM 600+D) 600-800 MG-UNIT TABS Take 1 tablet by mouth 2 (two) times daily.   Yes [provider]  cephALEXin (KEFLEX) 500 MG capsule Take 1 capsule (500 mg total) by mouth 4 (four) times daily. 06/13/19  Yes Daleen Bo, MD  chlorpheniramine (CHLOR-TRIMETON) 4 MG tablet Take  4 mg by mouth daily as needed for allergies.   Yes [provider]  cyclobenzaprine (FLEXERIL) 10 MG tablet TAKE 1 TABLET BY MOUTH THREE TIMES DAILY AS NEEDED FOR MUSCLE SPASM 09/13/18  Yes Laurey Morale, MD  dorzolamide (TRUSOPT) 2 % ophthalmic solution Place 1 drop into both eyes 2 (two) times daily.   Yes [provider]  furosemide (LASIX) 40 MG tablet Take 1 tablet by mouth twice daily 06/14/19  Yes Laurey Morale, MD  ibuprofen (ADVIL,MOTRIN) 200 MG tablet Take 400 mg by mouth every 4 (four) hours as needed (for pain).   Yes [provider]  losartan (COZAAR) 50 MG tablet Take 1 tablet by mouth once daily 06/14/19  Yes Laurey Morale, MD  Multiple Vitamin (MULTIVITAMIN WITH MINERALS) TABS tablet Take 1 tablet by mouth daily at 3 pm. One-A-Day 65+    Yes [provider]  Multiple Vitamins-Minerals (PRESERVISION AREDS 2 PO) Take 1 tablet by mouth 2 (two) times daily.   Yes [provider]  nabumetone (RELAFEN) 500 MG tablet Take 1 tablet (500 mg total) by mouth 2 (two) times daily. 08/03/17  Yes Laurey Morale, MD  NON FORMULARY    Yes [provider]  Omega 3 1200 MG CAPS Take 1,200 mg by mouth daily at 3 pm.   Yes [provider]  oxymetazoline (AFRIN) 0.05 % nasal spray Place 1 spray into both nostrils 2 (two) times daily as needed for congestion.   Yes [provider]  pantoprazole (PROTONIX) 40 MG tablet Take 1 tablet by mouth once daily 06/14/19  Yes Laurey Morale, MD  potassium chloride (K-DUR) 10 MEQ tablet TAKE 2 TABLETS BY MOUTH ONCE DAILY 01/23/19  Yes Laurey Morale, MD  simvastatin (ZOCOR) 20 MG tablet TAKE 1 TABLET BY MOUTH AT BEDTIME 06/14/19  Yes Laurey Morale, MD  traMADol (ULTRAM) 50 MG tablet Take 2 tablets (100 mg total) by mouth every 8 (eight) hours as needed for moderate pain. 07/05/19  Yes Laurey Morale, MD  TRAVATAN Z 0.004 % SOLN ophthalmic solution Place 1 drop into both eyes at bedtime. 12/07/18   Yes Laurey Morale, MD  vitamin B-12 (CYANOCOBALAMIN) 1000 MCG tablet Take 2,000 mcg by mouth daily at 3 pm.   Yes [provider]  Dextromethorphan-Guaifenesin (MUCUS RELIEF DM COUGH) 20-400 MG TABS Take 1-2 tablets by mouth 2 (two) times daily as needed (for cough/congestion).    [provider]  diphenhydrAMINE (BENADRYL) 12.5 MG chewable tablet Chew 12.5 mg by mouth 2 (two) times daily as needed (for allergic reaction (lip swelling)).    [provider]  Calcium Carbonate-Vit D-Min  1200-1000 MG-UNIT CHEW Chew 1 tablet by mouth daily.    09/16/11  [provider]     Family History  Problem Relation Age of Onset  . Alzheimer's disease Father   . Heart failure Father        d. 31  . Stroke Mother 43  . Cancer Sister        ovarian  . Alzheimer's disease Paternal Aunt   . Breast cancer Paternal Aunt   . Colon cancer Brother        dx. 21s  . Stroke Maternal Aunt        d. 58s  . Heart disease Maternal Grandfather   . Diabetes Maternal Grandfather   . Stroke Paternal Grandmother 73  . Heart attack Paternal Grandfather        d. 65s  . Ovarian cancer Sister        dx. 81s; no genetic testing  . Leukemia Grandchild 21  . Skin cancer Grandchild   . Cervical cancer Cousin 14       maternal 1st cousin  . Alzheimer's disease Paternal Aunt        (x4) paternal aunts  . Heart attack Paternal Uncle 25  . Colon polyps Neg Hx   . Rectal cancer Neg Hx   . Stomach cancer Neg Hx     Social History   Socioeconomic History  . Marital status: Married    Spouse name: Not on file  . Number of children: Not on file  . Years of education: Not on file  . Highest education level: Not on file  Occupational History  . Not on file  Tobacco Use  . Smoking status: Former Smoker    Packs/day: 0.50    Years: 36.00    Pack years: 18.00    Types: Cigarettes    Quit date: 07/05/1994    Years since quitting: 25.0  . Smokeless tobacco: Never Used  Substance  and Sexual Activity  . Alcohol use: Yes    Alcohol/week: 7.0 standard drinks    Types: 7 Glasses of wine per week  . Drug use: No  . Sexual activity: Not on file  Other Topics Concern  . Not on file  Social History Narrative  . Not on file   Social Determinants of Health   Financial Resource Strain:   . Difficulty of Paying Living Expenses: Not on file  Food Insecurity:   . Worried About Charity fundraiser in the Last Year: Not on file  . Ran Out of Food in the Last Year: Not on file  Transportation Needs:   . Lack of Transportation (Medical): Not on file  . Lack of Transportation (Non-Medical): Not on file  Physical Activity:   . Days of Exercise per Week: Not on file  . Minutes of Exercise per Session: Not on file  Stress:   . Feeling of Stress : Not on file  Social Connections:   . Frequency of Communication with Friends and Family: Not on file  . Frequency of Social Gatherings with Friends and Family: Not on file  . Attends Religious Services: Not on file  . Active Member of Clubs or Organizations: Not on file  . Attends Archivist Meetings: Not on file  . Marital Status: Not on file    Review of Systems: A 12 point ROS discussed and pertinent positives are indicated in the HPI above.  All other systems are negative.  Review of Systems  Vital Signs:  BP 131/82   Pulse 92   Temp 97.9 F (36.6 C) (Oral)   Resp 14   Ht 5\' 4"  (1.626 m)   Wt 86.2 kg   LMP  (LMP Unknown) Comment: full  SpO2 95%   BMI 32.61 kg/m   Physical Exam Constitutional:      Appearance: Normal appearance. She is not ill-appearing.  HENT:     Mouth/Throat:     Mouth: Mucous membranes are moist.     Pharynx: Oropharynx is clear.  Cardiovascular:     Rate and Rhythm: Normal rate and regular rhythm.     Heart sounds: Normal heart sounds.  Pulmonary:     Effort: Pulmonary effort is normal. No respiratory distress.     Breath sounds: Normal breath sounds.  Abdominal:      General: Abdomen is flat.     Palpations: Abdomen is soft.  Skin:    General: Skin is warm and dry.  Neurological:     General: No focal deficit present.     Mental Status: She is alert and oriented to person, place, and time.  Psychiatric:        Mood and Affect: Mood normal.        Thought Content: Thought content normal.        Judgment: Judgment normal.     Imaging: US Abdomen Complete  Result Date: 07/04/2019 CLINICAL DATA:  Right upper quadrant pain EXAM: ABDOMEN ULTRASOUND COMPLETE COMPARISON:  06/13/2019 CT FINDINGS: Gallbladder: No gallstones or wall thickening visualized. No sonographic Murphy sign noted by sonographer. Common bile duct: Diameter: 3.8 mm. Liver: Diffusely increased in echogenicity consistent with fatty infiltration. No focal mass lesion is noted. Portal vein is patent on color Doppler imaging with normal direction of blood flow towards the liver. IVC: No abnormality visualized. Pancreas: Visualized portion unremarkable. Spleen: Size and appearance within normal limits. Right Kidney: Length: 10.3 cm. Echogenicity within normal limits. No mass or hydronephrosis visualized. Left Kidney: Length: 12.7 cm. Echogenicity within normal limits. No mass or hydronephrosis visualized. Abdominal aorta: No aneurysm visualized. Other findings: None. IMPRESSION: Fatty infiltration of the liver. No other focal abnormality is noted. Electronically Signed   By: Inez Catalina M.D.   On: 07/04/2019 09:38    Labs:  CBC: Recent Labs    01/23/19 1057 06/13/19 1012 07/27/19 0855  WBC 9.3 10.0 10.9*  HGB 14.6 13.6 13.5  HCT 42.9 40.4 40.7  PLT 204.0 243 272    COAGS: Recent Labs    07/27/19 0855  INR 1.0    BMP: Recent Labs    01/23/19 1057 06/13/19 1012  NA 139 139  K 4.5 3.7  CL 97 101  CO2 29 26  GLUCOSE 136* 148*  BUN 16 11  CALCIUM 10.2 9.2  CREATININE 0.87 0.75  GFRNONAA  --  >60  GFRAA  --  >60    LIVER FUNCTION TESTS: Recent Labs    01/23/19 1057  06/13/19 1012  BILITOT 0.8 1.0  AST 71* 66*  ALT 54* 51*  ALKPHOS 125* 119  PROT 7.0 7.0  ALBUMIN 4.6 3.3*    TUMOR MARKERS: No results for input(s): AFPTM, CEA, CA199, CHROMGRNA in the last 8760 hours.  Assessment and Plan: T8 vertebral/paravertebral lesion. For CT guided biopsy Labs reviewed. Risks and benefits of biopsy was discussed with the patient and/or patient's family including, but not limited to bleeding, infection, damage to adjacent structures including lung/pneumothorax, or low yield requiring additional tests.  All of the questions were  answered and there is agreement to proceed.  Consent signed and in chart.    Thank you for this interesting consult.  I greatly enjoyed meeting SHAMETRA CUMBERLAND and look forward to participating in their care.  A copy of this report was sent to the requesting provider on this date.  Electronically Signed: Ascencion Dike, PA-C 07/27/2019, 10:20 AM   I spent a total of  25 minutes in face to face in clinical consultation, greater than 50% of which was counseling/coordinating care for T8 vertebral/paravertebral lesion biopsy

## 2019-07-27 NOTE — Addendum Note (Signed)
Addended by: Alysia Penna A on: 07/27/2019 04:27 PM   Modules accepted: Orders

## 2019-07-27 NOTE — Procedures (Signed)
Interventional Radiology Procedure:   Indications: T8 vertebral body lesion, history of breast cancer  Procedure: CT guided T8 lesion biopsy  Findings: Lucent destructive lesion along right side of T8 body.  3 cores obtained.   Complications: None     EBL: less than 10 ml  Plan: Bedrest 2 hours   Leetta Hendriks R. Anselm Pancoast, MD  Pager: [phone]

## 2019-07-30 ENCOUNTER — Telehealth: Payer: Self-pay | Admitting: *Deleted

## 2019-07-30 NOTE — Telephone Encounter (Signed)
Patient husband called after hours line. Patient husband reports wife had a biopsy to determine what kind of cancer she has on her spine. She is in pain and needing medication. Tramadol was called in but she is wanting Hydrocodone. She had the biospy was done 07/27/2019.

## 2019-07-30 NOTE — Telephone Encounter (Signed)
Rx sent in. Patient is aware.

## 2019-07-31 MED ORDER — HYDROCODONE-ACETAMINOPHEN 5-325 MG PO TABS: 1.0000 | ORAL_TABLET | ORAL | 0 refills | Status: AC | PRN

## 2019-07-31 NOTE — Telephone Encounter (Signed)
Spoke with the patient. She is aware that Norco has been sent in and that an appointment is needed for any further pain meds. Biopsy was done by Jovita Gamma, MD.

## 2019-07-31 NOTE — Telephone Encounter (Signed)
I sent in for #30 Norco. I will need to see her if she needs any pain medications after that. Also I have no information about what is going on, who did the biopsy, etc?

## 2019-08-01 ENCOUNTER — Other Ambulatory Visit: Payer: Self-pay | Admitting: Neurosurgery

## 2019-08-01 DIAGNOSIS — M5414 Radiculopathy, thoracic region: Secondary | ICD-10-CM | POA: Diagnosis not present

## 2019-08-01 DIAGNOSIS — C50919 Malignant neoplasm of unspecified site of unspecified female breast: Secondary | ICD-10-CM | POA: Diagnosis not present

## 2019-08-01 DIAGNOSIS — D492 Neoplasm of unspecified behavior of bone, soft tissue, and skin: Secondary | ICD-10-CM | POA: Diagnosis not present

## 2019-08-01 LAB — SURGICAL PATHOLOGY

## 2019-08-01 NOTE — Progress Notes (Signed)
[place], [place] - [address] [place] [place] [address] Phone: [phone] Fax: [phone]    Your procedure is scheduled on Monday, February 1st.  Report to [facility] Main Entrance "A" at 8:30 A.M., and check in at the Admitting office.  Call this number if you have problems the morning of surgery:  [phone]  Call [phone] if you have any questions prior to your surgery date Monday-Friday 8am-4pm   Remember:  Do not eat or drink after midnight the night before your surgery   Take these medicines the morning of surgery with A SIP OF WATER  dorzolamide (TRUSOPT)/eye drops pantoprazole (PROTONIX)   If needed - chlorpheniramine (CHLOR-TRIMETON), cyclobenzaprine (FLEXERIL), HYDROcodone-acetaminophen (NORCO),  oxymetazoline (AFRIN)/nasal spray, traMADol (ULTRAM)     Follow your surgeon's instructions on when to stop Aspirin.  If no instructions were given by your surgeon then you will need to call the office to get those instructions.    As of today STOP taking any Aspirin (unless otherwise instructed by your surgeon), nabumetone (RELAFEN), Aleve, Naproxen, Ibuprofen, Motrin, Advil, Goody's, BC's, all herbal medications, fish oil, and all vitamins.  The Morning of Surgery  Do not wear jewelry, make-up or nail polish.  Do not wear lotions, powders, or perfumes or deodorant  Do not shave 48 hours prior to surgery.    Do not bring valuables to the hospital.  [facility] is not responsible for any belongings or valuables.  If you are a smoker, DO NOT Smoke 24 hours prior to surgery  If you wear a CPAP at night please bring your mask the morning of surgery   Remember that you must have someone to transport you home after your surgery, and remain with you for 24 hours if you are discharged the same day.   Please bring cases for contacts, glasses, hearing aids, dentures or bridgework because it cannot be worn into  surgery.   Leave your suitcase in the car.  After surgery it may be brought to your room.  For patients admitted to the hospital, discharge time will be determined by your treatment team.  Patients discharged the day of surgery will not be allowed to drive home.   Special instructions:   [facility]- Preparing For Surgery  Before surgery, you can play an important role. Because skin is not sterile, your skin needs to be as free of germs as possible. You can reduce the number of germs on your skin by washing with CHG (chlorahexidine gluconate) Soap before surgery.  CHG is an antiseptic cleaner which kills germs and bonds with the skin to continue killing germs even after washing.    Oral Hygiene is also important to reduce your risk of infection.  Remember - BRUSH YOUR TEETH THE MORNING OF SURGERY WITH YOUR REGULAR TOOTHPASTE  Please do not use if you have an allergy to CHG or antibacterial soaps. If your skin becomes reddened/irritated stop using the CHG.  Do not shave (including legs and underarms) for at least 48 hours prior to first CHG shower. It is OK to shave your face.  Please follow these instructions carefully.   1. Shower the NIGHT BEFORE SURGERY and the MORNING OF SURGERY with CHG Soap.   2. If you chose to wash your hair, wash your hair first as usual with your normal shampoo.  3. After you shampoo, rinse your hair and body thoroughly to remove the shampoo.  4. Use CHG as you would any other liquid  soap. You can apply CHG directly to the skin and wash gently with a scrungie or a clean washcloth.   5. Apply the CHG Soap to your body ONLY FROM THE NECK DOWN.  Do not use on open wounds or open sores. Avoid contact with your eyes, ears, mouth and genitals (private parts). Wash Face and genitals (private parts)  with your normal soap.   6. Wash thoroughly, paying special attention to the area where your surgery will be performed.  7. Thoroughly rinse your body with warm water  from the neck down.  8. DO NOT shower/wash with your normal soap after using and rinsing off the CHG Soap.  9. Pat yourself dry with a CLEAN TOWEL.  10. Wear CLEAN PAJAMAS to bed the night before surgery, wear comfortable clothes the morning of surgery  11. Place CLEAN SHEETS on your bed the night of your first shower and DO NOT SLEEP WITH PETS.  Day of Surgery: Please shower the morning of surgery with the CHG soap Do not apply any deodorants/lotions. Please wear clean clothes to the hospital/surgery center.   Remember to brush your teeth WITH YOUR REGULAR TOOTHPASTE.  Please read over the following fact sheets that you were given.

## 2019-08-02 ENCOUNTER — Encounter (HOSPITAL_COMMUNITY): Payer: Self-pay

## 2019-08-02 ENCOUNTER — Other Ambulatory Visit (HOSPITAL_COMMUNITY)
Admission: RE | Admit: 2019-08-02 | Discharge: 2019-08-02 | Disposition: A | Discharge status: Home / Self Care | Payer: Medicare Other | Source: Ambulatory Visit | Attending: Neurosurgery | Admitting: Neurosurgery

## 2019-08-02 ENCOUNTER — Other Ambulatory Visit: Payer: Self-pay

## 2019-08-02 ENCOUNTER — Encounter (HOSPITAL_COMMUNITY)
Admission: RE | Admit: 2019-08-02 | Discharge: 2019-08-02 | Disposition: A | Discharge status: Home / Self Care | Payer: Medicare Other | Source: Ambulatory Visit | Attending: Neurosurgery | Admitting: Neurosurgery

## 2019-08-02 DIAGNOSIS — Z01812 Encounter for preprocedural laboratory examination: Secondary | ICD-10-CM | POA: Diagnosis not present

## 2019-08-02 DIAGNOSIS — I447 Left bundle-branch block, unspecified: Secondary | ICD-10-CM | POA: Insufficient documentation

## 2019-08-02 DIAGNOSIS — D434 Neoplasm of uncertain behavior of spinal cord: Secondary | ICD-10-CM | POA: Diagnosis not present

## 2019-08-02 DIAGNOSIS — R9431 Abnormal electrocardiogram [ECG] [EKG]: Secondary | ICD-10-CM | POA: Diagnosis not present

## 2019-08-02 DIAGNOSIS — Z20822 Contact with and (suspected) exposure to covid-19: Secondary | ICD-10-CM | POA: Insufficient documentation

## 2019-08-02 HISTORY — DX: Unspecified macular degeneration: H35.30

## 2019-08-02 LAB — CBC
HCT: 40.7 % (ref 36.0–46.0)
Hemoglobin: 13.9 g/dL (ref 12.0–15.0)
MCH: 33.6 pg (ref 26.0–34.0)
MCHC: 34.2 g/dL (ref 30.0–36.0)
MCV: 98.3 fL (ref 80.0–100.0)
Platelets: 300 10*3/uL (ref 150–400)
RBC: 4.14 MIL/uL (ref 3.87–5.11)
RDW: 11.6 % (ref 11.5–15.5)
WBC: 12.5 10*3/uL — ABNORMAL HIGH (ref 4.0–10.5)
nRBC: 0 % (ref 0.0–0.2)

## 2019-08-02 LAB — BASIC METABOLIC PANEL
Anion gap: 12 (ref 5–15)
BUN: 8 mg/dL (ref 8–23)
CO2: 24 mmol/L (ref 22–32)
Calcium: 9.2 mg/dL (ref 8.9–10.3)
Chloride: 100 mmol/L (ref 98–111)
Creatinine, Ser: 0.71 mg/dL (ref 0.44–1.00)
GFR calc Af Amer: >60 mL/min (ref >60)
GFR calc non Af Amer: >60 mL/min (ref >60)
Glucose, Bld: 144 mg/dL — ABNORMAL HIGH (ref 70–99)
Potassium: 3.7 mmol/L (ref 3.5–5.1)
Sodium: 136 mmol/L (ref 135–145)

## 2019-08-02 LAB — TYPE AND SCREEN
ABO/RH(D): O NEG
Antibody Screen: NEGATIVE

## 2019-08-02 LAB — HEMOGLOBIN A1C
Hgb A1c MFr Bld: 6.5 % — ABNORMAL HIGH (ref 4.8–5.6)
Mean Plasma Glucose: 139.85 mg/dL

## 2019-08-02 LAB — SARS CORONAVIRUS 2 (TAT 6-24 HRS): SARS Coronavirus 2: NEGATIVE

## 2019-08-02 LAB — SURGICAL PCR SCREEN
MRSA, PCR: NEGATIVE
Staphylococcus aureus: NEGATIVE

## 2019-08-02 LAB — ABO/RH: ABO/RH(D): O NEG

## 2019-08-02 NOTE — Progress Notes (Addendum)
PCP - Dr Nydia Bouton  Cardiologist -   Chest x-ray - NA  EKG - 06/14/2019  Stress Test - 2016  ECHO - 2015  Cardiac Cath - no  Sleep Study -   LABS- CBC, BMP, T/S, PCR  ASA-last dose 08/01/2019- takes preventily  ERAS-no  H1C yes- Glucose 144 today. A1C was 7.2  01/23/2019 at well visit. PCP noted A1C  Fasting Blood Sugar - na Checks Blood Sugar __0___ times a day  Anesthesia- Mrs Roach was short of breath/ winded when she arrived in the office. Shortness of breath subsided after she sat down for a couple of minutes. O2 Sat was 94 % when vital signs were checked. Ms Ventura Bruns states she is not normally short of breath, but the pain is > 10. Patient reports that heart rate was irregular in 2015, when she was getting worked up for hip surgery, she was sent to Dr Marlou Porch, an ECHO was done and patient was allowed to have surgery. Mrs Tuch saw Dr Marlou Porch again in 2018 and was told that she can come back if needed, but no set visits were necessary.    Pt denies having chest pain, sob, or fever at this time. All instructions explained to the pt, with a verbal understanding of the material. Pt agrees to go over the instructions while at home for a better understanding. Pt also instructed to self quarantine after being tested for COVID-19. The opportunity to ask questions was provided.

## 2019-08-02 NOTE — Anesthesia Preprocedure Evaluation (Addendum)
Anesthesia Evaluation  Patient identified by MRN, date of birth, ID band Patient awake    Reviewed: Allergy & Precautions, NPO status   Airway Mallampati: II  TM Distance: >3 FB Neck ROM: Full    Dental  (+) Teeth Intact, Dental Advisory Given   Pulmonary shortness of breath, former smoker,    breath sounds clear to auscultation       Cardiovascular hypertension, + dysrhythmias  Rhythm:Regular Rate:Normal     Neuro/Psych Anxiety Depression    GI/Hepatic GERD  ,  Endo/Other    Renal/GU      Musculoskeletal   Abdominal (+) + obese,   Peds  Hematology   Anesthesia Other Findings   Reproductive/Obstetrics                          Anesthesia Physical Anesthesia Plan  ASA: III  Anesthesia Plan: General   Post-op Pain Management:    Induction: Intravenous  PONV Risk Score and Plan: 3 and Midazolam and Ondansetron  Airway Management Planned: Oral ETT  Additional Equipment:   Intra-op Plan:   Post-operative Plan: Possible Post-op intubation/ventilation  Informed Consent:   Plan Discussed with:   Anesthesia Plan Comments: (History of cardiac eval for abnormal EKG. Last seen by Dr. Marlou Porch 04/28/17. Per note, "Transient Second heart block type I, Wenckebach - She has not had any high risk symptoms such as syncope. She has noted the irregular pulse warning on her blood pressure machine originally.  She has not noticed this in quite some time. Originally we had stopped her timolol eyedrops, but now she desires to get back on these because she is worried that her eyesight is getting worse.  I think it would be reasonable to resume. LBBB- as above. She gets an EKG during yearly physical.  If there are any difficulties, please let us know and we will be happy to follow-up with her.  At this point, as-needed basis."  Preop labs reviewed, unremarkable.   EKG 06/13/19: Sinus rhythm with 1st degree  A-V block. Rate 92. Left axis deviation. Left bundle branch block. since last tracing no significant change  Nuclear stress 06/12/15: Nuclear stress EF: 69%. Normal wall motion. There was no ST segment deviation noted during stress. The study is normal. This is a low risk study. No ischemia.  TTE 2015: - Left ventricle: The cavity size was normal. Wall thickness was  increased in a pattern of mild LVH. Systolic function was normal.  Wall motion was normal; there were no regional wall motion  abnormalities. Doppler parameters are consistent with abnormal  left ventricular relaxation (grade 1 diastolic dysfunction).  - Atrial septum: No defect or patent foramen ovale was identified.  )      Anesthesia Quick Evaluation

## 2019-08-02 NOTE — Progress Notes (Addendum)
Anesthesia Chart Review:  History of cardiac eval for abnormal EKG. Last seen by Dr. Marlou Porch 04/28/17. Per note, "Transient Second heart block type I, Wenckebach - She has not had any high risk symptoms such as syncope. She has noted the irregular pulse warning on her blood pressure machine originally.  She has not noticed this in quite some time. Originally we had stopped her timolol eyedrops, but now she desires to get back on these because she is worried that her eyesight is getting worse.  I think it would be reasonable to resume. LBBB- as above. She gets an EKG during yearly physical.  If there are any difficulties, please let us know and we will be happy to follow-up with her.  At this point, as-needed basis."  Preop labs reviewed, unremarkable.  EKG 06/13/19: Sinus rhythm with 1st degree A-V block. Rate 92. Left axis deviation. Left bundle branch block. since last tracing no significant change   Nuclear stress 06/12/15:  Nuclear stress EF: 69%. Normal wall motion.  There was no ST segment deviation noted during stress.  The study is normal.  This is a low risk study. No ischemia.  TTE 2015: - Left ventricle: The cavity size was normal. Wall thickness was  increased in a pattern of mild LVH. Systolic function was normal.  Wall motion was normal; there were no regional wall motion  abnormalities. Doppler parameters are consistent with abnormal  left ventricular relaxation (grade 1 diastolic dysfunction).  - Atrial septum: No defect or patent foramen ovale was identified.   Wynonia Musty [facility] Short Stay Center/Anesthesiology Phone [phone] 08/02/2019 2:03 PM

## 2019-08-02 NOTE — Progress Notes (Signed)
Your procedure is scheduled on Monday, February 1st.  Report to [facility] Entrance "A" at 8:30 A.M., and check in at the Admitting office.             Your surgery or procedure is scheduled for 10:30  Call this number if you have problems the morning of surgery:  [phone]  Call [phone] if you have any questions prior to your surgery date Monday-Friday 8am-4pm   Remember:  Do not eat or drink after midnight the night before your surgery   Take these medicines the morning of surgery with A SIP OF WATER  dorzolamide (TRUSOPT)/eye drops pantoprazole (PROTONIX)   If needed - chlorpheniramine (CHLOR-TRIMETON), cyclobenzaprine (FLEXERIL), HYDROcodone-acetaminophen (NORCO),  oxymetazoline (AFRIN)/nasal spray, traMADol (ULTRAM)     Follow your surgeon's instructions on when to stop Aspirin.  If no instructions were given by your surgeon then you will need to call the office to get those instructions.    As of today STOP taking any Aspirin (unless otherwise instructed by your surgeon), nabumetone (RELAFEN), Aleve, Naproxen, Ibuprofen, Motrin, Advil, Goody's, BC's, all herbal medications, fish oil, and all vitamins.  Special instructions:   [facility]- Preparing For Surgery  Before surgery, you can play an important role. Because skin is not sterile, your skin needs to be as free of germs as possible. You can reduce the number of germs on your skin by washing with CHG (chlorahexidine gluconate) Soap before surgery.  CHG is an antiseptic cleaner which kills germs and bonds with the skin to continue killing germs even after washing.    Oral Hygiene is also important to reduce your risk of infection.  Remember - BRUSH YOUR TEETH THE MORNING OF SURGERY WITH YOUR REGULAR TOOTHPASTE  Please do not use if you have an allergy to CHG or antibacterial soaps. If your skin becomes reddened/irritated stop using the CHG.  Do not shave (including legs and underarms) for at least 48 hours prior  to first CHG shower. It is OK to shave your face.  Please follow these instructions carefully.   1. Shower the NIGHT BEFORE SURGERY and the MORNING OF SURGERY with CHG Soap.   2. If you chose to wash your hair, wash your hair first as usual with your normal shampoo.  3. After you shampoo, rinse your hair and body thoroughly to remove the shampoo.  4. Use CHG as you would any other liquid soap. You can apply CHG directly to the skin and wash gently with a scrungie or a clean washcloth.   5. Apply the CHG Soap to your body ONLY FROM THE NECK DOWN.  Do not use on open wounds or open sores. Avoid contact with your eyes, ears, mouth and genitals (private parts). Wash Face and genitals (private parts)  with your normal soap.   6. Wash thoroughly, paying special attention to the area where your surgery will be performed.  7. Thoroughly rinse your body with warm water from the neck down.  8. DO NOT shower/wash with your normal soap after using and rinsing off the CHG Soap.  9. Pat yourself dry with a CLEAN TOWEL.  10. Wear CLEAN PAJAMAS to bed the night before surgery, wear comfortable clothes the morning of surgery  11. Place CLEAN SHEETS on your bed the night of your first shower and DO NOT SLEEP WITH PETS.  Day of Surgery: Shower as instructed above. Do not wear lotions, powders, or perfumes or deodorant Please wear clean clothes to the hospital/surgery  center.   Remember to brush your teeth WITH YOUR REGULAR TOOTHPASTE.  Do not wear jewelry, make-up or nail polish.  Do not shave 48 hours prior to surgery.    Do not bring valuables to the hospital.  [facility] is not responsible for any belongings or valuables.  If you wear a CPAP at night please bring your mask the morning of surgery   Remember that you must have someone to transport you home after your surgery, and remain with you for 24 hours if you are discharged the same day.  Please bring cases for contacts, glasses, hearing  aids, dentures or bridgework because it cannot be worn into surgery.   Leave your suitcase in the car.  After surgery it may be brought to your room.  For patients admitted to the hospital, discharge time will be determined by your treatment team.  Patients discharged the day of surgery will not be allowed to drive home.  Please read over the following fact sheets that you were given.

## 2019-08-03 ENCOUNTER — Other Ambulatory Visit: Payer: Self-pay | Admitting: Neurosurgery

## 2019-08-03 ENCOUNTER — Telehealth: Payer: Self-pay | Admitting: Family Medicine

## 2019-08-03 NOTE — Telephone Encounter (Signed)
Connie Lang called wanting to know if there is another medication that will help with the patients pain. She is taking Hydrocodone every 4 hours but it is wearing off in 2 hours. She is in so much pain that she started screaming last night and he has never seen her in that much pain.   She has an appointment on Monday with Dr. Durene Cal to remove cancer off of her spinal cord.   Please Advise

## 2019-08-03 NOTE — Progress Notes (Signed)
I received a call from Mr Fredenburg, he said that Mrs Kimball is having increased pain- the pain is more intense than before. Mr. Backer reports that he is giving Mrs Grecco Vicodin and Tylenol for pain.  I educated Mr. Johndrow that he need to check the amount of Tylenol that patient is taking - it should not be over 4000 mg a day.I encouraged Mr. Velvet Bathe to call Dr Lollie Sails office, it will open at 0900. Mr Fullman said he will" because she cant take this."

## 2019-08-06 ENCOUNTER — Inpatient Hospital Stay (HOSPITAL_COMMUNITY)
Admission: RE | Admit: 2019-08-06 | Discharge: 2019-08-07 | DRG: 457 | Disposition: A | Discharge status: Home / Self Care | Payer: Medicare Other | Attending: Neurosurgery | Admitting: Neurosurgery

## 2019-08-06 ENCOUNTER — Encounter (HOSPITAL_COMMUNITY): Payer: Self-pay | Admitting: Neurosurgery

## 2019-08-06 ENCOUNTER — Other Ambulatory Visit: Payer: Self-pay

## 2019-08-06 ENCOUNTER — Encounter (HOSPITAL_COMMUNITY)
Admission: RE | Disposition: A | Discharge status: Home / Self Care | Payer: Self-pay | Source: Home / Self Care | Attending: Neurosurgery

## 2019-08-06 ENCOUNTER — Encounter: Payer: Self-pay | Admitting: Hematology and Oncology

## 2019-08-06 ENCOUNTER — Inpatient Hospital Stay (HOSPITAL_COMMUNITY): Payer: Medicare Other | Admitting: Certified Registered"

## 2019-08-06 ENCOUNTER — Inpatient Hospital Stay (HOSPITAL_COMMUNITY): Payer: Medicare Other

## 2019-08-06 ENCOUNTER — Inpatient Hospital Stay (HOSPITAL_COMMUNITY): Payer: Medicare Other | Admitting: Vascular Surgery

## 2019-08-06 DIAGNOSIS — R0609 Other forms of dyspnea: Secondary | ICD-10-CM | POA: Diagnosis not present

## 2019-08-06 DIAGNOSIS — R0600 Dyspnea, unspecified: Secondary | ICD-10-CM | POA: Diagnosis not present

## 2019-08-06 DIAGNOSIS — E782 Mixed hyperlipidemia: Secondary | ICD-10-CM | POA: Diagnosis present

## 2019-08-06 DIAGNOSIS — Z6834 Body mass index (BMI) 34.0-34.9, adult: Secondary | ICD-10-CM

## 2019-08-06 DIAGNOSIS — Z806 Family history of leukemia: Secondary | ICD-10-CM

## 2019-08-06 DIAGNOSIS — M353 Polymyalgia rheumatica: Secondary | ICD-10-CM | POA: Diagnosis not present

## 2019-08-06 DIAGNOSIS — E669 Obesity, unspecified: Secondary | ICD-10-CM | POA: Diagnosis present

## 2019-08-06 DIAGNOSIS — Z96643 Presence of artificial hip joint, bilateral: Secondary | ICD-10-CM | POA: Diagnosis present

## 2019-08-06 DIAGNOSIS — I441 Atrioventricular block, second degree: Secondary | ICD-10-CM | POA: Diagnosis present

## 2019-08-06 DIAGNOSIS — Z923 Personal history of irradiation: Secondary | ICD-10-CM

## 2019-08-06 DIAGNOSIS — Y838 Other surgical procedures as the cause of abnormal reaction of the patient, or of later complication, without mention of misadventure at the time of the procedure: Secondary | ICD-10-CM | POA: Diagnosis not present

## 2019-08-06 DIAGNOSIS — Y92234 Operating room of hospital as the place of occurrence of the external cause: Secondary | ICD-10-CM | POA: Diagnosis not present

## 2019-08-06 DIAGNOSIS — Z85828 Personal history of other malignant neoplasm of skin: Secondary | ICD-10-CM | POA: Diagnosis not present

## 2019-08-06 DIAGNOSIS — Z8049 Family history of malignant neoplasm of other genital organs: Secondary | ICD-10-CM | POA: Diagnosis not present

## 2019-08-06 DIAGNOSIS — Z79899 Other long term (current) drug therapy: Secondary | ICD-10-CM | POA: Diagnosis not present

## 2019-08-06 DIAGNOSIS — Z808 Family history of malignant neoplasm of other organs or systems: Secondary | ICD-10-CM

## 2019-08-06 DIAGNOSIS — C801 Malignant (primary) neoplasm, unspecified: Secondary | ICD-10-CM | POA: Diagnosis not present

## 2019-08-06 DIAGNOSIS — D492 Neoplasm of unspecified behavior of bone, soft tissue, and skin: Secondary | ICD-10-CM | POA: Diagnosis not present

## 2019-08-06 DIAGNOSIS — Z7982 Long term (current) use of aspirin: Secondary | ICD-10-CM | POA: Diagnosis not present

## 2019-08-06 DIAGNOSIS — Z800 Family history of malignant neoplasm of digestive organs: Secondary | ICD-10-CM | POA: Diagnosis not present

## 2019-08-06 DIAGNOSIS — Z803 Family history of malignant neoplasm of breast: Secondary | ICD-10-CM

## 2019-08-06 DIAGNOSIS — M4324 Fusion of spine, thoracic region: Secondary | ICD-10-CM | POA: Diagnosis not present

## 2019-08-06 DIAGNOSIS — M5414 Radiculopathy, thoracic region: Secondary | ICD-10-CM | POA: Diagnosis not present

## 2019-08-06 DIAGNOSIS — Z853 Personal history of malignant neoplasm of breast: Secondary | ICD-10-CM | POA: Diagnosis not present

## 2019-08-06 DIAGNOSIS — K219 Gastro-esophageal reflux disease without esophagitis: Secondary | ICD-10-CM | POA: Diagnosis not present

## 2019-08-06 DIAGNOSIS — C7951 Secondary malignant neoplasm of bone: Principal | ICD-10-CM | POA: Diagnosis present

## 2019-08-06 DIAGNOSIS — Z981 Arthrodesis status: Secondary | ICD-10-CM | POA: Diagnosis not present

## 2019-08-06 DIAGNOSIS — G9741 Accidental puncture or laceration of dura during a procedure: Secondary | ICD-10-CM | POA: Diagnosis not present

## 2019-08-06 DIAGNOSIS — Z8249 Family history of ischemic heart disease and other diseases of the circulatory system: Secondary | ICD-10-CM

## 2019-08-06 DIAGNOSIS — Z419 Encounter for procedure for purposes other than remedying health state, unspecified: Secondary | ICD-10-CM

## 2019-08-06 DIAGNOSIS — C7989 Secondary malignant neoplasm of other specified sites: Secondary | ICD-10-CM | POA: Diagnosis not present

## 2019-08-06 DIAGNOSIS — Z87891 Personal history of nicotine dependence: Secondary | ICD-10-CM

## 2019-08-06 DIAGNOSIS — I447 Left bundle-branch block, unspecified: Secondary | ICD-10-CM

## 2019-08-06 DIAGNOSIS — Z8041 Family history of malignant neoplasm of ovary: Secondary | ICD-10-CM

## 2019-08-06 DIAGNOSIS — I10 Essential (primary) hypertension: Secondary | ICD-10-CM | POA: Diagnosis not present

## 2019-08-06 DIAGNOSIS — E785 Hyperlipidemia, unspecified: Secondary | ICD-10-CM | POA: Diagnosis not present

## 2019-08-06 HISTORY — DX: Atrioventricular block, second degree: I44.1

## 2019-08-06 HISTORY — DX: Atrioventricular block, first degree: I44.0

## 2019-08-06 HISTORY — DX: Left bundle-branch block, unspecified: I44.7

## 2019-08-06 SURGERY — POSTERIOR LUMBAR FUSION 1 LEVEL
Anesthesia: General | Site: Spine Thoracic

## 2019-08-06 MED ORDER — 0.9 % SODIUM CHLORIDE (POUR BTL) OPTIME
TOPICAL | Status: DC | PRN
  Administered 2019-08-06 (×2): 1000 mL

## 2019-08-06 MED ORDER — KETOROLAC TROMETHAMINE 30 MG/ML IJ SOLN
15.0000 mg | Freq: Once | INTRAMUSCULAR | Status: AC
  Administered 2019-08-06: 16:00:00 15 mg via INTRAVENOUS

## 2019-08-06 MED ORDER — OXYCODONE HCL 5 MG PO TABS: 5.0000 mg | ORAL_TABLET | Freq: Once | ORAL | Status: DC | PRN

## 2019-08-06 MED ORDER — FLEET ENEMA 7-19 GM/118ML RE ENEM: 1.0000 | ENEMA | Freq: Once | RECTAL | Status: DC | PRN

## 2019-08-06 MED ORDER — LIDOCAINE 2% (20 MG/ML) 5 ML SYRINGE
INTRAMUSCULAR | Status: AC
  Filled 2019-08-06: qty 5

## 2019-08-06 MED ORDER — ACETAMINOPHEN 10 MG/ML IV SOLN
INTRAVENOUS | Status: AC
  Filled 2019-08-06: qty 100

## 2019-08-06 MED ORDER — MORPHINE SULFATE (PF) 4 MG/ML IV SOLN: 4.0000 mg | INTRAVENOUS | Status: DC | PRN

## 2019-08-06 MED ORDER — KETAMINE HCL 50 MG/5ML IJ SOSY
PREFILLED_SYRINGE | INTRAMUSCULAR | Status: AC
  Filled 2019-08-06: qty 5

## 2019-08-06 MED ORDER — HYDROXYZINE HCL 50 MG/ML IM SOLN: 50.0000 mg | INTRAMUSCULAR | Status: DC | PRN

## 2019-08-06 MED ORDER — PHENYLEPHRINE 40 MCG/ML (10ML) SYRINGE FOR IV PUSH (FOR BLOOD PRESSURE SUPPORT)
PREFILLED_SYRINGE | INTRAVENOUS | Status: AC
  Filled 2019-08-06: qty 10

## 2019-08-06 MED ORDER — HYDROCODONE-ACETAMINOPHEN 5-325 MG PO TABS
1.0000 | ORAL_TABLET | ORAL | Status: DC | PRN
  Administered 2019-08-06 – 2019-08-07 (×5): 2 via ORAL
  Filled 2019-08-06 (×5): qty 2

## 2019-08-06 MED ORDER — DEXMEDETOMIDINE HCL IN NACL 80 MCG/20ML IV SOLN
INTRAVENOUS | Status: AC
  Filled 2019-08-06: qty 20

## 2019-08-06 MED ORDER — THROMBIN 5000 UNITS EX SOLR
CUTANEOUS | Status: AC
  Filled 2019-08-06: qty 5000

## 2019-08-06 MED ORDER — GLYCOPYRROLATE PF 0.2 MG/ML IJ SOSY
PREFILLED_SYRINGE | INTRAMUSCULAR | Status: AC
  Filled 2019-08-06: qty 1

## 2019-08-06 MED ORDER — GLYCOPYRROLATE PF 0.2 MG/ML IJ SOSY
PREFILLED_SYRINGE | INTRAMUSCULAR | Status: DC | PRN
  Administered 2019-08-06: .1 mg via INTRAVENOUS

## 2019-08-06 MED ORDER — ACETAMINOPHEN 650 MG RE SUPP: 650.0000 mg | RECTAL | Status: DC | PRN

## 2019-08-06 MED ORDER — PROPOFOL 10 MG/ML IV BOLUS
INTRAVENOUS | Status: DC | PRN
  Administered 2019-08-06: 120 mg via INTRAVENOUS

## 2019-08-06 MED ORDER — PANTOPRAZOLE SODIUM 40 MG PO TBEC
40.0000 mg | DELAYED_RELEASE_TABLET | Freq: Every day | ORAL | Status: DC
  Administered 2019-08-06 – 2019-08-07 (×2): 40 mg via ORAL
  Filled 2019-08-06 (×2): qty 1

## 2019-08-06 MED ORDER — THROMBIN 5000 UNITS EX SOLR: OROMUCOSAL | Status: DC | PRN

## 2019-08-06 MED ORDER — LIDOCAINE-EPINEPHRINE 1 %-1:100000 IJ SOLN
INTRAMUSCULAR | Status: AC
  Filled 2019-08-06: qty 1

## 2019-08-06 MED ORDER — PHENYLEPHRINE 40 MCG/ML (10ML) SYRINGE FOR IV PUSH (FOR BLOOD PRESSURE SUPPORT)
PREFILLED_SYRINGE | INTRAVENOUS | Status: DC | PRN
  Administered 2019-08-06: 80 ug via INTRAVENOUS

## 2019-08-06 MED ORDER — FENTANYL CITRATE (PF) 100 MCG/2ML IJ SOLN
INTRAMUSCULAR | Status: AC
  Filled 2019-08-06: qty 2

## 2019-08-06 MED ORDER — DEXAMETHASONE SODIUM PHOSPHATE 10 MG/ML IJ SOLN
INTRAMUSCULAR | Status: AC
  Filled 2019-08-06: qty 1

## 2019-08-06 MED ORDER — ROCURONIUM BROMIDE 50 MG/5ML IV SOSY
PREFILLED_SYRINGE | INTRAVENOUS | Status: DC | PRN
  Administered 2019-08-06: 30 mg via INTRAVENOUS
  Administered 2019-08-06 (×2): 50 mg via INTRAVENOUS

## 2019-08-06 MED ORDER — THROMBIN 20000 UNITS EX SOLR: CUTANEOUS | Status: DC | PRN

## 2019-08-06 MED ORDER — POTASSIUM CHLORIDE CRYS ER 20 MEQ PO TBCR
20.0000 meq | EXTENDED_RELEASE_TABLET | Freq: Every day | ORAL | Status: DC
  Administered 2019-08-06: 18:00:00 20 meq via ORAL
  Filled 2019-08-06: qty 1

## 2019-08-06 MED ORDER — ONDANSETRON HCL 4 MG/2ML IJ SOLN: 4.0000 mg | Freq: Four times a day (QID) | INTRAMUSCULAR | Status: DC | PRN

## 2019-08-06 MED ORDER — ONDANSETRON HCL 4 MG/2ML IJ SOLN: 4.0000 mg | Freq: Once | INTRAMUSCULAR | Status: DC | PRN

## 2019-08-06 MED ORDER — PHENOL 1.4 % MT LIQD: 1.0000 | OROMUCOSAL | Status: DC | PRN

## 2019-08-06 MED ORDER — SODIUM CHLORIDE 0.9% FLUSH: 3.0000 mL | INTRAVENOUS | Status: DC | PRN

## 2019-08-06 MED ORDER — SODIUM CHLORIDE 0.9 % IV SOLN: INTRAVENOUS | Status: DC | PRN

## 2019-08-06 MED ORDER — LOSARTAN POTASSIUM 50 MG PO TABS
50.0000 mg | ORAL_TABLET | Freq: Every evening | ORAL | Status: DC
  Administered 2019-08-06: 18:00:00 50 mg via ORAL
  Filled 2019-08-06: qty 1

## 2019-08-06 MED ORDER — ALUM & MAG HYDROXIDE-SIMETH 200-200-20 MG/5ML PO SUSP: 30.0000 mL | Freq: Four times a day (QID) | ORAL | Status: DC | PRN

## 2019-08-06 MED ORDER — CEFAZOLIN SODIUM-DEXTROSE 2-4 GM/100ML-% IV SOLN
2.0000 g | INTRAVENOUS | Status: AC
  Administered 2019-08-06: 12:00:00 2 g via INTRAVENOUS

## 2019-08-06 MED ORDER — LACTATED RINGERS IV SOLN: Freq: Once | INTRAVENOUS | Status: AC

## 2019-08-06 MED ORDER — ALBUMIN HUMAN 5 % IV SOLN: INTRAVENOUS | Status: DC | PRN

## 2019-08-06 MED ORDER — KETAMINE HCL 10 MG/ML IJ SOLN
INTRAMUSCULAR | Status: DC | PRN
  Administered 2019-08-06: 20 mg via INTRAVENOUS
  Administered 2019-08-06: 30 mg via INTRAVENOUS

## 2019-08-06 MED ORDER — FUROSEMIDE 40 MG PO TABS
40.0000 mg | ORAL_TABLET | Freq: Two times a day (BID) | ORAL | Status: DC
  Administered 2019-08-06 – 2019-08-07 (×2): 40 mg via ORAL
  Filled 2019-08-06 (×2): qty 1

## 2019-08-06 MED ORDER — DEXAMETHASONE SODIUM PHOSPHATE 10 MG/ML IJ SOLN
INTRAMUSCULAR | Status: DC | PRN
  Administered 2019-08-06: 10 mg via INTRAVENOUS

## 2019-08-06 MED ORDER — PROPOFOL 10 MG/ML IV BOLUS
INTRAVENOUS | Status: AC
  Filled 2019-08-06: qty 20

## 2019-08-06 MED ORDER — FENTANYL CITRATE (PF) 250 MCG/5ML IJ SOLN
INTRAMUSCULAR | Status: AC
  Filled 2019-08-06: qty 5

## 2019-08-06 MED ORDER — LACTATED RINGERS IV SOLN: INTRAVENOUS | Status: DC | PRN

## 2019-08-06 MED ORDER — KETOROLAC TROMETHAMINE 30 MG/ML IJ SOLN
INTRAMUSCULAR | Status: AC
  Filled 2019-08-06: qty 1

## 2019-08-06 MED ORDER — CHLORHEXIDINE GLUCONATE CLOTH 2 % EX PADS: 6.0000 | MEDICATED_PAD | Freq: Once | CUTANEOUS | Status: DC

## 2019-08-06 MED ORDER — FENTANYL CITRATE (PF) 100 MCG/2ML IJ SOLN
25.0000 ug | INTRAMUSCULAR | Status: DC | PRN
  Administered 2019-08-06: 16:00:00 50 ug via INTRAVENOUS

## 2019-08-06 MED ORDER — ONDANSETRON HCL 4 MG/2ML IJ SOLN
INTRAMUSCULAR | Status: AC
  Filled 2019-08-06: qty 2

## 2019-08-06 MED ORDER — SODIUM CHLORIDE 0.9 % IV SOLN: 250.0000 mL | INTRAVENOUS | Status: DC

## 2019-08-06 MED ORDER — MENTHOL 3 MG MT LOZG: 1.0000 | LOZENGE | OROMUCOSAL | Status: DC | PRN

## 2019-08-06 MED ORDER — THROMBIN 20000 UNITS EX SOLR
CUTANEOUS | Status: AC
  Filled 2019-08-06: qty 20000

## 2019-08-06 MED ORDER — KCL IN DEXTROSE-NACL 40-5-0.45 MEQ/L-%-% IV SOLN
INTRAVENOUS | Status: DC
  Filled 2019-08-06: qty 1000

## 2019-08-06 MED ORDER — SIMVASTATIN 20 MG PO TABS
20.0000 mg | ORAL_TABLET | Freq: Every day | ORAL | Status: DC
  Administered 2019-08-06: 22:00:00 20 mg via ORAL
  Filled 2019-08-06: qty 1

## 2019-08-06 MED ORDER — BUPIVACAINE HCL (PF) 0.5 % IJ SOLN
INTRAMUSCULAR | Status: DC | PRN
  Administered 2019-08-06: 15 mL

## 2019-08-06 MED ORDER — HYDROXYZINE HCL 25 MG PO TABS: 50.0000 mg | ORAL_TABLET | ORAL | Status: DC | PRN

## 2019-08-06 MED ORDER — AMLODIPINE BESYLATE 5 MG PO TABS
5.0000 mg | ORAL_TABLET | Freq: Every day | ORAL | Status: DC
  Administered 2019-08-06 – 2019-08-07 (×2): 5 mg via ORAL
  Filled 2019-08-06 (×2): qty 1

## 2019-08-06 MED ORDER — ACETAMINOPHEN 325 MG PO TABS: 650.0000 mg | ORAL_TABLET | ORAL | Status: DC | PRN

## 2019-08-06 MED ORDER — BUPIVACAINE HCL (PF) 0.5 % IJ SOLN
INTRAMUSCULAR | Status: AC
  Filled 2019-08-06: qty 30

## 2019-08-06 MED ORDER — SODIUM CHLORIDE 0.9% FLUSH
3.0000 mL | Freq: Two times a day (BID) | INTRAVENOUS | Status: DC
  Administered 2019-08-07: 09:00:00 3 mL via INTRAVENOUS

## 2019-08-06 MED ORDER — ACETAMINOPHEN 10 MG/ML IV SOLN
INTRAVENOUS | Status: DC | PRN
  Administered 2019-08-06: 1000 mg via INTRAVENOUS

## 2019-08-06 MED ORDER — FENTANYL CITRATE (PF) 100 MCG/2ML IJ SOLN
INTRAMUSCULAR | Status: DC | PRN
  Administered 2019-08-06: 100 ug via INTRAVENOUS
  Administered 2019-08-06: 50 ug via INTRAVENOUS
  Administered 2019-08-06: 25 ug via INTRAVENOUS
  Administered 2019-08-06: 50 ug via INTRAVENOUS
  Administered 2019-08-06: 100 ug via INTRAVENOUS
  Administered 2019-08-06: 25 ug via INTRAVENOUS
  Administered 2019-08-06: 100 ug via INTRAVENOUS
  Administered 2019-08-06: 50 ug via INTRAVENOUS

## 2019-08-06 MED ORDER — ONDANSETRON HCL 4 MG PO TABS: 4.0000 mg | ORAL_TABLET | Freq: Four times a day (QID) | ORAL | Status: DC | PRN

## 2019-08-06 MED ORDER — SUGAMMADEX SODIUM 200 MG/2ML IV SOLN
INTRAVENOUS | Status: DC | PRN
  Administered 2019-08-06: 100 mg via INTRAVENOUS
  Administered 2019-08-06: 200 mg via INTRAVENOUS

## 2019-08-06 MED ORDER — MAGNESIUM HYDROXIDE 400 MG/5ML PO SUSP: 30.0000 mL | Freq: Every day | ORAL | Status: DC | PRN

## 2019-08-06 MED ORDER — HEMOSTATIC AGENTS (NO CHARGE) OPTIME
TOPICAL | Status: DC | PRN
  Administered 2019-08-06: 1 via TOPICAL

## 2019-08-06 MED ORDER — OXYCODONE HCL 5 MG/5ML PO SOLN: 5.0000 mg | Freq: Once | ORAL | Status: DC | PRN

## 2019-08-06 MED ORDER — BISACODYL 10 MG RE SUPP: 10.0000 mg | Freq: Every day | RECTAL | Status: DC | PRN

## 2019-08-06 MED ORDER — KETOROLAC TROMETHAMINE 30 MG/ML IJ SOLN
15.0000 mg | Freq: Four times a day (QID) | INTRAMUSCULAR | Status: DC
  Administered 2019-08-06 – 2019-08-07 (×3): 15 mg via INTRAVENOUS
  Filled 2019-08-06 (×3): qty 1

## 2019-08-06 MED ORDER — ONDANSETRON HCL 4 MG/2ML IJ SOLN
INTRAMUSCULAR | Status: DC | PRN
  Administered 2019-08-06: 4 mg via INTRAVENOUS

## 2019-08-06 MED ORDER — ROCURONIUM BROMIDE 10 MG/ML (PF) SYRINGE
PREFILLED_SYRINGE | INTRAVENOUS | Status: AC
  Filled 2019-08-06: qty 10

## 2019-08-06 MED ORDER — CYCLOBENZAPRINE HCL 5 MG PO TABS
5.0000 mg | ORAL_TABLET | Freq: Three times a day (TID) | ORAL | Status: DC | PRN
  Administered 2019-08-06: 18:00:00 10 mg via ORAL
  Filled 2019-08-06: qty 2

## 2019-08-06 MED ORDER — LIDOCAINE-EPINEPHRINE 1 %-1:100000 IJ SOLN
INTRAMUSCULAR | Status: DC | PRN
  Administered 2019-08-06: 15 mL

## 2019-08-06 MED ORDER — CEFAZOLIN SODIUM-DEXTROSE 2-4 GM/100ML-% IV SOLN
INTRAVENOUS | Status: AC
  Filled 2019-08-06: qty 100

## 2019-08-06 MED ORDER — PHENYLEPHRINE HCL-NACL 10-0.9 MG/250ML-% IV SOLN
INTRAVENOUS | Status: DC | PRN
  Administered 2019-08-06: 60 ug/min via INTRAVENOUS

## 2019-08-06 MED ORDER — LIDOCAINE 2% (20 MG/ML) 5 ML SYRINGE
INTRAMUSCULAR | Status: DC | PRN
  Administered 2019-08-06: 40 mg via INTRAVENOUS

## 2019-08-06 SURGICAL SUPPLY — 71 items
ADH SKN CLS APL DERMABOND .7 (GAUZE/BANDAGES/DRESSINGS) ×1
APL SKNCLS STERI-STRIP NONHPOA (GAUZE/BANDAGES/DRESSINGS) ×1
BAG DECANTER FOR FLEXI CONT (MISCELLANEOUS) ×2 IMPLANT
BENZOIN TINCTURE PRP APPL 2/3 (GAUZE/BANDAGES/DRESSINGS) ×2 IMPLANT
BUR ACRON 5.0MM COATED (BURR) ×2 IMPLANT
BUR MATCHSTICK NEURO 3.0 LAGG (BURR) ×2 IMPLANT
CANISTER SUCT 3000ML PPV (MISCELLANEOUS) ×2 IMPLANT
CAP LCK SPNE (Orthopedic Implant) ×5 IMPLANT
CAP LOCK SPINE RADIUS (Orthopedic Implant) IMPLANT
CAP LOCKING (Orthopedic Implant) ×10 IMPLANT
CARTRIDGE OIL MAESTRO DRILL (MISCELLANEOUS) ×1 IMPLANT
COVER BACK TABLE 60X90IN (DRAPES) ×2 IMPLANT
DERMABOND ADVANCED (GAUZE/BANDAGES/DRESSINGS) ×1
DERMABOND ADVANCED .7 DNX12 (GAUZE/BANDAGES/DRESSINGS) ×1 IMPLANT
DIFFUSER DRILL AIR PNEUMATIC (MISCELLANEOUS) ×2 IMPLANT
DRAPE C-ARM 42X72 X-RAY (DRAPES) ×3 IMPLANT
DRAPE C-ARMOR (DRAPES) ×1 IMPLANT
DRAPE HALF SHEET 40X57 (DRAPES) ×1 IMPLANT
DRAPE INCISE IOBAN 66X45 STRL (DRAPES) ×1 IMPLANT
DRAPE LAPAROTOMY 100X72X124 (DRAPES) ×2 IMPLANT
ELECT REM PT RETURN 9FT ADLT (ELECTROSURGICAL) ×2
ELECTRODE REM PT RTRN 9FT ADLT (ELECTROSURGICAL) ×1 IMPLANT
GAUZE 4X4 16PLY RFD (DISPOSABLE) IMPLANT
GAUZE SPONGE 4X4 12PLY STRL (GAUZE/BANDAGES/DRESSINGS) ×2 IMPLANT
GAUZE SPONGE 4X4 12PLY STRL LF (GAUZE/BANDAGES/DRESSINGS) ×1 IMPLANT
GLOVE BIOGEL PI IND STRL 7.0 (GLOVE) IMPLANT
GLOVE BIOGEL PI IND STRL 7.5 (GLOVE) IMPLANT
GLOVE BIOGEL PI IND STRL 8 (GLOVE) ×2 IMPLANT
GLOVE BIOGEL PI INDICATOR 7.0 (GLOVE) ×1
GLOVE BIOGEL PI INDICATOR 7.5 (GLOVE) ×1
GLOVE BIOGEL PI INDICATOR 8 (GLOVE) ×2
GLOVE ECLIPSE 7.5 STRL STRAW (GLOVE) ×4 IMPLANT
GOWN STRL REUS W/ TWL LRG LVL3 (GOWN DISPOSABLE) IMPLANT
GOWN STRL REUS W/ TWL XL LVL3 (GOWN DISPOSABLE) ×2 IMPLANT
GOWN STRL REUS W/TWL 2XL LVL3 (GOWN DISPOSABLE) IMPLANT
GOWN STRL REUS W/TWL LRG LVL3 (GOWN DISPOSABLE) ×6
GOWN STRL REUS W/TWL XL LVL3 (GOWN DISPOSABLE) ×6
HEMOSTAT POWDER KIT SURGIFOAM (HEMOSTASIS) ×1 IMPLANT
KIT BASIN OR (CUSTOM PROCEDURE TRAY) ×2 IMPLANT
KIT TURNOVER KIT B (KITS) ×2 IMPLANT
NDL ASP BONE MRW 8GX15 (NEEDLE) IMPLANT
NDL SPNL 18GX3.5 QUINCKE PK (NEEDLE) ×1 IMPLANT
NDL SPNL 22GX3.5 QUINCKE BK (NEEDLE) ×1 IMPLANT
NEEDLE ASP BONE MRW 8GX15 (NEEDLE) ×2 IMPLANT
NEEDLE SPNL 18GX3.5 QUINCKE PK (NEEDLE) ×6 IMPLANT
NEEDLE SPNL 22GX3.5 QUINCKE BK (NEEDLE) ×2 IMPLANT
NS IRRIG 1000ML POUR BTL (IV SOLUTION) ×2 IMPLANT
OIL CARTRIDGE MAESTRO DRILL (MISCELLANEOUS) ×2
PACK LAMINECTOMY NEURO (CUSTOM PROCEDURE TRAY) ×2 IMPLANT
PAD ARMBOARD 7.5X6 YLW CONV (MISCELLANEOUS) ×6 IMPLANT
PATTIES SURGICAL .5 X.5 (GAUZE/BANDAGES/DRESSINGS) IMPLANT
PATTIES SURGICAL .5 X1 (DISPOSABLE) IMPLANT
PATTIES SURGICAL 1X1 (DISPOSABLE) IMPLANT
ROD RADIUS NO-HEX 70MM (Rod) ×2 IMPLANT
SCREW 4.75X40MM (Screw) ×2 IMPLANT
SCREW 5.75X40M (Screw) ×3 IMPLANT
SEALANT ADHERUS EXTEND TIP (MISCELLANEOUS) ×1 IMPLANT
SPONGE LAP 4X18 RFD (DISPOSABLE) IMPLANT
SPONGE NEURO XRAY DETECT 1X3 (DISPOSABLE) ×1 IMPLANT
SPONGE SURGIFOAM ABS GEL 100 (HEMOSTASIS) ×2 IMPLANT
STRIP BIOACTIVE VITOSS 25X100X (Neuro Prosthesis/Implant) ×1 IMPLANT
SUT PROLENE 6 0 BV (SUTURE) ×1 IMPLANT
SUT VIC AB 1 CT1 18XBRD ANBCTR (SUTURE) ×2 IMPLANT
SUT VIC AB 1 CT1 8-18 (SUTURE) ×6
SUT VIC AB 2-0 CP2 18 (SUTURE) ×4 IMPLANT
SYR CONTROL 10ML LL (SYRINGE) ×2 IMPLANT
TAPE CLOTH SURG 4X10 WHT LF (GAUZE/BANDAGES/DRESSINGS) ×1 IMPLANT
TOWEL GREEN STERILE (TOWEL DISPOSABLE) ×2 IMPLANT
TOWEL GREEN STERILE FF (TOWEL DISPOSABLE) ×2 IMPLANT
TRAY FOLEY MTR SLVR 16FR STAT (SET/KITS/TRAYS/PACK) ×2 IMPLANT
WATER STERILE IRR 1000ML POUR (IV SOLUTION) ×2 IMPLANT

## 2019-08-06 NOTE — Progress Notes (Signed)
Vitals:   08/06/19 1550 08/06/19 1605 08/06/19 1620 08/06/19 1633  BP: (!) 114/56 (!) 92/58 103/67 109/60  Pulse: 89 89 90 92  Resp: 14 17 19 19   Temp:    98 F (36.7 C)  TempSrc:    Oral  SpO2: 93% 95% 91% 97%  Weight:      Height:        Patient resting in bed.  Moderate incisional pain, but excellent relief of disabling right thoracic radicular pain.  Strength 5/5 in lower extremities.  Dressing clean and dry.  Foley to straight drainage.  Plan: We will plan on mobilizing in a.m.  Monitor progression and DC Foley once ambulating adequately.  Spoke with patient and her husband regarding plans for care over the next day.  Hosie Spangle, MD 08/06/2019, 7:10 PM

## 2019-08-06 NOTE — Consult Note (Addendum)
Cardiology Consultation:   Patient ID: DACI STUBBE MRN: [id number]; DOB: Nov 16, 1940  Admit date: 08/06/2019 Date of Consult: 08/06/2019  Primary Care Provider: Laurey Morale, MD Primary Cardiologist: Candee Furbish, MD  Primary Electrophysiologist:  None   Patient Profile:   Connie Lang is a 79 y.o. female with a hx of Breast CA, remote vaginal wall cancer, anxiety, depression, GERD, glaucoma, HTN, HLD, ostehoarthritis, PMR, LBBB, first degree AVB, history of transient 2nd degree AV block type 1 (Wenckebach) who is being seen today for the evaluation of pre-op clearance at the request of Dr. Sherwood Gambler.  History of Present Illness:   She has no history of CAD or CHF. She remotely saw Dr. Marlou Porch for abnormal EKG but did not require ongoing cardiology follow-up. She has a known LBBB/IVCD. Prior to her previous hip surgery by Dr. Lillia Corporal she was found to have a slightly irregular heart rhythm which was consistent with a second-degree heart block type. 2D echo in 2015 showed mild LVH, EF normal, grade 1 DD. Prior stress test 2016 was normal. She has recently been followed by neurosurgery due to 40-month history of severe right thoracic radicular pain wrapping around the side of her torso. She was seen in the ED for this on 06/12/20 and troponins were negative. CT scan was initially felt unremarkable but upon subsequent review as OP by neurosurgery revealed findings suspicious for a lytic lesion involving the T8 vertebral body, suspected to be contributing to symptoms. Biopsy by IR of the lesion revealed adenocarcinoma, with unknown primary source. She presents today for thoracic laminectomy for resection of the extradural tumor with thoracic arthrodesis. When seen by pre-admission testing on 08/02/19, she was noted to be winded when she arrived to the office. She indicated this was due to having to breathe shallow from her thoracic pain. Due to this finding, cardiology was asked to assess for  clearance. She denies any prior history of SOB at home. She has a two story home and states she can go up and down multiple times a day without any breathing difficulties. She denies any typical-type angina including left sided/central chest pain or pressure. No palpitations, syncope, dizziness. Her major complaint is the ongoing right sided thoracic pain for which she is felt to require surgery.   Heart Pathway Score:     Past Medical History:  Diagnosis Date  . Allergy    occasionally has hayfever  . Anxiety   . Breast cancer ([place]) 2017  . Cancer ([place]) 1987   cancerous cells in vaginal wall seemed to be metastatic from the gut but no primary was ever found   . Cataract   . Chicken pox   . Depression   . DJD (degenerative joint disease)   . First degree AV block   . GERD (gastroesophageal reflux disease)   . Glaucoma    glaucoma and cataracts sees Dr Satira Sark   . Hyperlipemia   . Hypertension    pt denies currently. " Prednisone made it high."  . LBBB (left bundle branch block)   . Lumbar disc disease    sees Dr. Sherwood Gambler   . Macular degeneration   . Mobitz type 1 second degree atrioventricular block   . Osteoarthritis   . Osteopenia   . Personal history of radiation therapy   . PMR (polymyalgia rheumatica) (HCC)    sees Dr. Leigh Aurora   . Radiation    02-25-16 last radiation for breast cancer  . Transfusion history    blood  Past Surgical History:  Procedure Laterality Date  . ABDOMINAL HYSTERECTOMY     TAH BSO 1985  . APPENDECTOMY    . BASAL CELL CARCINOMA EXCISION     nose and rt thigh  . BREAST BIOPSY      x 4  . BREAST EXCISIONAL BIOPSY Right 1997  . BREAST EXCISIONAL BIOPSY Left 1985  . BREAST EXCISIONAL BIOPSY Left 1965  . BREAST EXCISIONAL BIOPSY Right 1959  . BREAST LUMPECTOMY Right 12/25/2015  . BREAST LUMPECTOMY  02/27/2018   see report  . BREAST LUMPECTOMY WITH RADIOACTIVE SEED LOCALIZATION Right 12/25/2015   Procedure: RIGHT BREAST LUMPECTOMY  WITH RADIOACTIVE SEED LOCALIZATION;  Surgeon: Rolm Bookbinder, MD;  Location: [facility];  Service: General;  Laterality: Right;  . CATARACT EXTRACTION     01-27-2011 left, 02-17-2011 right   . CERVICAL LAMINECTOMY     1983  . cervical microdiskectomy     1990 Dr Jovita Gamma  . CESAREAN SECTION    . COLONOSCOPY  03/21/2018   per Dr. Silverio Decamp, adenomatous polyps, no repeats due to age   . EYE SURGERY     bil cataracts with lens implant  . FOOT ARTHRODESIS, MODIFIED MCBRIDE Right   . LUMBAR LAMINECTOMY     1990 Dr Jovita Gamma  . MYELOGRAM    . POLYPECTOMY    . REFRACTIVE SURGERY     01-29-2009 04-07-2009 Dr Foye Clock in [place]  . spinal injection     07-30-2012, 09-24-2012  . TENDON REPAIR Right    rt wrist  . TOTAL HIP ARTHROPLASTY Right 05/13/2016   Procedure: RIGHT TOTAL HIP ARTHROPLASTY ANTERIOR APPROACH;  Surgeon: Rod Can, MD;  Location: [facility];  Service: Orthopedics;  Laterality: Right;  [place] RNFA  . TOTAL HIP ARTHROPLASTY Left 11/11/2016   Procedure: LEFT TOTAL HIP ARTHROPLASTY ANTERIOR APPROACH;  Surgeon: Rod Can, MD;  Location: [facility];  Service: Orthopedics;  Laterality: Left;  Dr. requesting RNFA  . [place]     x3  . WRIST SURGERY Right    tendon repair     Home Medications:  Prior to Admission medications   Medication Sig Start Date End Date Taking? Authorizing Provider  acetaminophen (TYLENOL) 650 MG CR tablet Take 650-1,300 mg by mouth every 8 (eight) hours as needed for pain.   Yes [provider]  amLODipine (NORVASC) 5 MG tablet TAKE 1 TABLET BY MOUTH ONCE DAILY IN THE EVENING Patient taking differently: Take 5 mg by mouth daily.  06/12/19  Yes Laurey Morale, MD  anti-nausea (EMETROL) solution Take 10 mLs by mouth every 15 (fifteen) minutes as needed for nausea or vomiting.   Yes [provider]  Calcium Carb-Cholecalciferol (CALCIUM 600+D) 600-800 MG-UNIT TABS Take 1 tablet by  mouth 2 (two) times daily.   Yes [provider]  cyclobenzaprine (FLEXERIL) 10 MG tablet TAKE 1 TABLET BY MOUTH THREE TIMES DAILY AS NEEDED FOR MUSCLE SPASM Patient taking differently: Take 10 mg by mouth 3 (three) times daily as needed (back pain.).  09/13/18  Yes Laurey Morale, MD  diphenhydrAMINE (BENADRYL) 12.5 MG chewable tablet Chew 12.5 mg by mouth 2 (two) times daily as needed (for allergic reaction (lip swelling)).   Yes [provider]  furosemide (LASIX) 40 MG tablet Take 1 tablet by mouth twice daily Patient taking differently: Take 40 mg by mouth 2 (two) times daily.  06/14/19  Yes Laurey Morale, MD  guaifenesin (TUSSIN) 100 MG/5ML syrup Take  200 mg by mouth 3 (three) times daily as needed for cough.   Yes [provider]  HYDROcodone-acetaminophen (NORCO/VICODIN) 5-325 MG tablet Take 1 tablet by mouth every 4 (four) hours as needed for moderate pain.   Yes [provider]  ibuprofen (ADVIL,MOTRIN) 200 MG tablet Take 400 mg by mouth every 8 (eight) hours as needed (for pain).    Yes [provider]  losartan (COZAAR) 50 MG tablet Take 1 tablet by mouth once daily Patient taking differently: Take 50 mg by mouth every evening.  06/14/19  Yes Laurey Morale, MD  Multiple Vitamin (MULTIVITAMIN WITH MINERALS) TABS tablet Take 1 tablet by mouth daily at 3 pm. One-A-Day 65+    Yes [provider]  Multiple Vitamins-Minerals (PRESERVISION AREDS 2 PO) Take 1 tablet by mouth 2 (two) times daily.   Yes [provider]  oxymetazoline (AFRIN) 0.05 % nasal spray Place 1 spray into both nostrils at bedtime.    Yes [provider]  pantoprazole (PROTONIX) 40 MG tablet Take 1 tablet by mouth once daily Patient taking differently: Take 40 mg by mouth daily.  06/14/19  Yes Laurey Morale, MD  Polyethyl Glycol-Propyl Glycol (SYSTANE) 0.4-0.3 % SOLN Place 1 drop into both eyes 3 (three) times daily as needed (dry/irritated eyes.).    Yes [provider]  potassium chloride (K-DUR) 10 MEQ tablet TAKE 2 TABLETS BY MOUTH ONCE DAILY Patient taking differently: Take 20 mEq by mouth daily in the afternoon. Mid afternoon 01/23/19  Yes Laurey Morale, MD  simvastatin (ZOCOR) 20 MG tablet TAKE 1 TABLET BY MOUTH AT BEDTIME Patient taking differently: Take 20 mg by mouth at bedtime.  06/14/19  Yes Laurey Morale, MD  timolol (TIMOPTIC) 0.5 % ophthalmic solution Place 1 drop into both eyes 2 (two) times daily. 06/15/19  Yes [provider]  traMADol (ULTRAM) 50 MG tablet Take 2 tablets (100 mg total) by mouth every 6 (six) hours as needed for moderate pain. 07/27/19  Yes Laurey Morale, MD  TRAVATAN Z 0.004 % SOLN ophthalmic solution Place 1 drop into both eyes at bedtime. 12/07/18  Yes Laurey Morale, MD  vitamin B-12 (CYANOCOBALAMIN) 1000 MCG tablet Take 2,000 mcg by mouth daily at 3 pm.   Yes [provider]  aspirin 81 MG chewable tablet Chew 1 tablet (81 mg total) by mouth 2 (two) times daily. Patient taking differently: Chew 81 mg by mouth every evening.  11/12/16   Swinteck, Aaron Edelman, MD  cephALEXin (KEFLEX) 500 MG capsule Take 1 capsule (500 mg total) by mouth 4 (four) times daily. Patient not taking: Reported on 08/01/2019 06/13/19   Daleen Bo, MD  chlorpheniramine (CHLOR-TRIMETON) 4 MG tablet Take 4 mg by mouth daily as needed for allergies.    [provider]  nabumetone (RELAFEN) 500 MG tablet Take 1 tablet (500 mg total) by mouth 2 (two) times daily. Patient taking differently: Take 500 mg by mouth 2 (two) times daily as needed (pain.).  08/03/17   Laurey Morale, MD  Omega 3 1200 MG CAPS Take 2,400 mg by mouth daily in the afternoon.     [provider]  Calcium Carbonate-Vit D-Min 1200-1000 MG-UNIT CHEW Chew 1 tablet by mouth daily.    09/16/11  [provider]    Inpatient Medications: Scheduled Meds: . Chlorhexidine Gluconate Cloth  6 each Topical Once   And  .  Chlorhexidine Gluconate Cloth  6 each Topical Once   Continuous Infusions: .  ceFAZolin    .  ceFAZolin (ANCEF) IV     PRN Meds:   Allergies:   No Known Allergies  Social History:   Social History   Socioeconomic History  . Marital status: Married    Spouse name: Not on file  . Number of children: Not on file  . Years of education: Not on file  . Highest education level: Not on file  Occupational History  . Not on file  Tobacco Use  . Smoking status: Former Smoker    Packs/day: 0.50    Years: 36.00    Pack years: 18.00    Types: Cigarettes    Quit date: 07/05/1994    Years since quitting: 25.1  . Smokeless tobacco: Never Used  Substance and Sexual Activity  . Alcohol use: Yes    Alcohol/week: 7.0 standard drinks    Types: 7 Glasses of wine per week    Comment: 08/02/2019- not currently- taking pain medications  . Drug use: No  . Sexual activity: Not on file  Other Topics Concern  . Not on file  Social History Narrative  . Not on file   Social Determinants of Health   Financial Resource Strain:   . Difficulty of Paying Living Expenses: Not on file  Food Insecurity:   . Worried About Charity fundraiser in the Last Year: Not on file  . Ran Out of Food in the Last Year: Not on file  Transportation Needs:   . Lack of Transportation (Medical): Not on file  . Lack of Transportation (Non-Medical): Not on file  Physical Activity:   . Days of Exercise per Week: Not on file  . Minutes of Exercise per Session: Not on file  Stress:   . Feeling of Stress : Not on file  Social Connections:   . Frequency of Communication with Friends and Family: Not on file  . Frequency of Social Gatherings with Friends and Family: Not on file  . Attends Religious Services: Not on file  . Active Member of Clubs or Organizations: Not on file  . Attends Archivist Meetings: Not on file  . Marital Status: Not on file  Intimate Partner Violence:   . Fear of Current or Ex-Partner:  Not on file  . Emotionally Abused: Not on file  . Physically Abused: Not on file  . Sexually Abused: Not on file    Family History:    Family History  Problem Relation Age of Onset  . Alzheimer's disease Father   . Heart failure Father        d. 2  . Stroke Mother 27  . Cancer Sister        ovarian  . Alzheimer's disease Paternal Aunt   . Breast cancer Paternal Aunt   . Colon cancer Brother        dx. 16s  . Stroke Maternal Aunt        d. 72s  . Heart disease Maternal Grandfather   . Diabetes Maternal Grandfather   . Stroke Paternal Grandmother 73  . Heart attack Paternal Grandfather        d. 26s  . Ovarian cancer Sister        dx. 31s; no genetic testing  . Leukemia Grandchild 21  . Skin cancer Grandchild   . Cervical cancer Cousin 19       maternal 1st cousin  . Alzheimer's disease Paternal Aunt        (x4) paternal aunts  . Heart  attack Paternal Uncle 4  . Colon polyps Neg Hx   . Rectal cancer Neg Hx   . Stomach cancer Neg Hx      ROS:  Please see the history of present illness.  All other ROS reviewed and negative.     Physical Exam/Data:   Vitals:   08/06/19 0724  BP: (!) 164/81  Pulse: 90  Resp: 18  Temp: 98.3 F (36.8 C)  TempSrc: Oral  SpO2: 97%  Weight: 90.7 kg  Height: 5\' 4"  (1.626 m)   No intake or output data in the 24 hours ending 08/06/19 1054 Last 3 Weights 08/06/2019 08/02/2019 07/27/2019  Weight (lbs) 200 lb 201 lb 1.6 oz 190 lb  Weight (kg) 90.719 kg 91.218 kg 86.183 kg     Body mass index is 34.33 kg/m.  General: Well developed, well nourished obese WF, in no acute distress but grimacing from pain Head: Normocephalic, atraumatic, sclera non-icteric, no xanthomas, nares are without discharge. Neck: Negative for carotid bruits. JVP not elevated. Lungs: Clear bilaterally to auscultation without wheezes, rales, or rhonchi. Breathing is unlabored. Heart: RRR S1 S2 without murmurs, rubs, or gallops.  Abdomen: Soft, non-tender,  non-distended with normoactive bowel sounds. No rebound/guarding. Extremities: No clubbing or cyanosis. No edema. Distal pedal pulses are 2+ and equal bilaterally. Neuro: Alert and oriented X 3. Moves all extremities spontaneously. Psych:  Responds to questions appropriately with a normal affect.   EKG:  The EKG was personally reviewed and demonstrates: NSR 95bpm LBBB and first degree AV block similar to prior  Telemetry:  Telemetry was personally reviewed and demonstrates:  n/a  Relevant CV Studies: 2D echo 11/2013 - Left ventricle: The cavity size was normal. Wall thickness was  increased in a pattern of mild LVH. Systolic function was normal.  Wall motion was normal; there were no regional wall motion  abnormalities. Doppler parameters are consistent with abnormal  left ventricular relaxation (grade 1 diastolic dysfunction).  - Atrial septum: No defect or patent foramen ovale was identified.   NST 06/2015  Nuclear stress EF: 69%. Normal wall motion.  There was no ST segment deviation noted during stress.  The study is normal.  This is a low risk study. No ischemia    Laboratory Data:  High Sensitivity Troponin:  No results for input(s): TROPONINIHS in the last 720 hours.   Chemistry Recent Labs  Lab 08/02/19 1051  NA 136  K 3.7  CL 100  CO2 24  GLUCOSE 144*  BUN 8  CREATININE 0.71  CALCIUM 9.2  GFRNONAA >60  GFRAA >60  ANIONGAP 12    No results for input(s): PROT, ALBUMIN, AST, ALT, ALKPHOS, BILITOT in the last 168 hours. Hematology Recent Labs  Lab 08/02/19 1051  WBC 12.5*  RBC 4.14  HGB 13.9  HCT 40.7  MCV 98.3  MCH 33.6  MCHC 34.2  RDW 11.6  PLT 300   BNPNo results for input(s): BNP, PROBNP in the last 168 hours.  DDimer No results for input(s): DDIMER in the last 168 hours.   Radiology/Studies:  No results found. { Assessment and Plan:   1. Pre-op cardiovascular examination for thoracic surgery related to T8 lesion  (adenocarcinoma, primary unknown) - revised cardiac risk index is 0.4% indicating low risk of CV complications. There was some report of appearing winded but suspect this was related to shallow breathing from 10/10 thoracic pain when ambulating to her appointment. At home she states she is able to exceed 4 METS without any dyspnea  or chest pain. Therefore, based on ACC/AHA guidelines, Connie Lang would be at acceptable risk for the planned procedure without further cardiovascular testing.   2. History of LBBB and first degree AVB, with remote history of type 1 second degree AV block - appears stable. Avoid AVN blocking agents.  3. HTN - per primary team.  For questions or updates, please contact [place] Please consult [email or web address] for contact info under   Signed, Charlie Pitter, PA-C  08/06/2019 10:54 AM   Attending Note:   The patient was seen and examined.  Agree with assessment and plan as noted above.  Changes made to the above note as needed.  Patient seen and independently examined with Melina Copa , PA .   We discussed all aspects of the encounter. I agree with the assessment and plan as stated above.  1.  Dyspnea on exertion: The patient has a history of left bundle branch block that is been stable.  This is been worked up in the past.  She was thought to be slightly short of breath when she showed up for preoperative visit but she states that this is actually very stable for her.  She has a history of shallow breathing.  Echocardiogram and Myoview study performed several years ago were unremarkable.  She does not have any signs or symptoms of congestive heart failure.  She has been very stable.  She will be at low risk for her back surgery today.  2.  Left bundle branch block.  This is stable.  This is all ready been worked up.  3.  Obesity: She needs to work on diet, exercise, weight loss program.  4.  Metastatic adenocarcinoma: Invasive radiology has biopsied her  thoracic vertebrae and found adenocarcinoma.  She has a history of breast cancer in the past and also remote vaginal wall cancer.  Markers were not conclusive of the primary.  She will need radiation to her thoracic region.   Follow-up with [place] yearly or as needed.   I have spent a total of 40 minutes with patient reviewing hospital  notes , telemetry, EKGs, labs and examining patient as well as establishing an assessment and plan that was discussed with the patient. > 50% of time was spent in direct patient care.    Thayer Headings, Brooke Bonito., MD, [facility] 08/06/2019, 11:09 AM [address]  [place] Pager [phone]

## 2019-08-06 NOTE — Transfer of Care (Signed)
Immediate Anesthesia Transfer of Care Note  Patient: Connie Lang  Procedure(s) Performed: THORACIC LAMINECTOMY FOR RESECTION OF EXTRADURAL TUMOR, THORACIC ARTHRODESIS WITH INSTRUMENTATION (N/A Spine Thoracic)  Patient Location: PACU  Anesthesia Type:General  Level of Consciousness: drowsy  Airway & Oxygen Therapy: Patient Spontanous Breathing and Patient connected to face mask oxygen  Post-op Assessment: Report given to RN and Post -op Vital signs reviewed and stable  Post vital signs: Reviewed  Last Vitals:  Vitals Value Taken Time  BP 109/63 08/06/19 1533  Temp    Pulse 94 08/06/19 1540  Resp 17 08/06/19 1540  SpO2 94 % 08/06/19 1540  Vitals shown include unvalidated device data.  Last Pain:  Vitals:   08/06/19 0756  TempSrc:   PainSc: 0-No pain         Complications: No apparent anesthesia complications

## 2019-08-06 NOTE — Anesthesia Postprocedure Evaluation (Signed)
Anesthesia Post Note  Patient: Connie Lang  Procedure(s) Performed: THORACIC LAMINECTOMY FOR RESECTION OF EXTRADURAL TUMOR, THORACIC ARTHRODESIS WITH INSTRUMENTATION (N/A Spine Thoracic)     Patient location during evaluation: PACU Anesthesia Type: General Level of consciousness: awake and alert Pain management: pain level controlled Vital Signs Assessment: post-procedure vital signs reviewed and stable Respiratory status: spontaneous breathing, nonlabored ventilation, respiratory function stable and patient connected to nasal cannula oxygen Cardiovascular status: blood pressure returned to baseline and stable Postop Assessment: no apparent nausea or vomiting Anesthetic complications: no    Last Vitals:  Vitals:   08/06/19 1620 08/06/19 1633  BP: 103/67 109/60  Pulse: 90 92  Resp: 19 19  Temp:  36.7 C  SpO2: 91% 97%    Last Pain:  Vitals:   08/06/19 1633  TempSrc: Oral  PainSc:                  [name] COKER

## 2019-08-06 NOTE — Progress Notes (Signed)
  PACU check:  Patient resting in recovery room, mildly restless due to discomfort.  Given Precedex by Dr. Linna Caprice, and appears more comfortable.  Following commands with all 4 extremities (wiggling toes and feet well).  Spoke to patient's husband by phone, and updated him on patient's condition, extent of tumor resection, etc.  Plan: Initial postoperative care in PACU, to transition to [place] unit.  We will mobilize once awake and alert from anesthesia.  Hosie Spangle, MD 08/06/2019, 3:43 PM

## 2019-08-06 NOTE — H&P (Signed)
Subjective: Patient is a 79 y.o. right-handed white female who is admitted for treatment of T8 metastasis with an associated disabling right thoracic radicular pain syndrome.  Patient's difficulties began in late November with pain in the upper abdomen radiating around the right side of her torso.  She contacted her primary physician, Connie. Alysia Lang, and he recommended evaluation at the emergency room.  She was evaluated at [facility] ER June 13, 2019 and a renal CT protocol was performed to rule out kidney stone.  No stone was found, and she was prescribed an antibiotic by the EDP for suspected kidney infection.  However she had no improvement.  She followed up with Connie. Sarajane Lang and he ordered an ultrasound of the abdomen in late December, and the patient was told that her kidneys, gallbladder, liver, spleen, and pancreas looked good.  Patient's husband examined her and found an area of tenderness in the lower thoracic region, and they contacted my office about 3 weeks ago requesting evaluation.  In speaking with the patient, her pain syndrome was typical of a right thoracic radicular pain syndrome with pain rating from the lower thoracic region around the lower right side of the torso in a radicular pattern.  I reviewed the renal CT protocol and noted a lytic lesion involving the right side of the T8 vertebra, extending into the right T8 pedicle, suspicious for malignant lytic lesion extending into the right T8-9 neural foramen, which corresponded to her right thoracic radiculopathy.  I then obtained an MRI of the thoracic spine without and with contrast which confirmed a tumor destroying the right side of the T8 vertebra extending into the right paraspinal soft tissues and the right T8-9 neural foramen compressing the exiting right T8 nerve root.  The interpreting radiologist raised the possibility of a plasmacytoma, and therefore a biopsy by interventional radiology was obtained.  Pathology reports adenocarcinoma,  but extensive fissural staining was positive only for CK7, and the interpreting pathologist felt it was nonspecific, and could represent breast, lung, upper GI, or renal source.  The patient is therefore now admitted for tumor subtotal resection via thoracic laminectomy.  Additional pathology specimens will be obtained at time of surgery.  Because of the significant associated back pain, a short segment arthrodesis from T7-T9 with instrumentation is planned as well.  On the day of admission, the patient's anesthesiologist, Connie. Belinda Lang, raised concerns regarding shortness of breath reported by the preadmission staff.  She requested that the patient be evaluated by cardiology prior to proceeding with general anesthesia and surgery.  Patient has been seen by [facility], Connie Copa PA and Connie. Mertie Lang.  Patient's been cleared by cardiology to undergo general anesthesia and surgery today.  She is admitted for such.  Patient Active Problem List   Diagnosis Date Noted  . Osteoarthritis of left hip 11/11/2016  . Family history of ovarian cancer 01/27/2016  . Family history of colon cancer 01/27/2016  . Breast cancer of upper-outer quadrant of right female breast ([place]) 12/03/2015  . Generalized edema 11/21/2015  . PMR (polymyalgia rheumatica) (HCC) 07/23/2015  . RBBB (right bundle branch Lang) 11/01/2013  . HTN (hypertension) 11/01/2013  . Obesity, unspecified 11/01/2013  . Pure hypercholesterolemia 11/01/2013  . CARCINOMA, BASAL CELL 07/29/2009  . Hyperlipemia, mixed 07/29/2009  . GLAUCOMA 07/29/2009  . Essential hypertension 07/29/2009  . Osteoarthritis 07/29/2009  . COLONIC POLYPS, HX OF 07/29/2009   Past Medical History:  Diagnosis Date  . Allergy    occasionally has hayfever  .  Anxiety   . Breast cancer ([place]) 2017  . Cancer ([place]) 1987   cancerous cells in vaginal wall seemed to be metastatic from the gut but no primary was ever found   . Cataract   . Chicken pox    . Constipation   . Depression   . DJD (degenerative joint disease)   . Dyspnea    due to pain  . Dysrhythmia    caused by eye drop   . GERD (gastroesophageal reflux disease)   . Glaucoma    glaucoma and cataracts sees Connie Lang   . Hyperlipemia   . Hypertension    pt denies currently. " Prednisone made it high."  . Lumbar disc disease    sees Connie. Sherwood Lang   . Macular degeneration   . Osteoarthritis   . Osteopenia   . Personal history of radiation therapy   . PMR (polymyalgia rheumatica) (HCC)    sees Connie. Leigh Lang   . Radiation    02-25-16 last radiation for breast cancer  . Transfusion history    blood     Past Surgical History:  Procedure Laterality Date  . ABDOMINAL HYSTERECTOMY     TAH BSO 1985  . APPENDECTOMY    . BASAL CELL CARCINOMA EXCISION     nose and rt thigh  . BREAST BIOPSY      x 4  . BREAST EXCISIONAL BIOPSY Right 1997  . BREAST EXCISIONAL BIOPSY Left 1985  . BREAST EXCISIONAL BIOPSY Left 1965  . BREAST EXCISIONAL BIOPSY Right 1959  . BREAST LUMPECTOMY Right 12/25/2015  . BREAST LUMPECTOMY  02/27/2018   see report  . BREAST LUMPECTOMY WITH RADIOACTIVE SEED LOCALIZATION Right 12/25/2015   Procedure: RIGHT BREAST LUMPECTOMY WITH RADIOACTIVE SEED LOCALIZATION;  Surgeon: Connie Bookbinder, MD;  Location: [place];  Service: General;  Laterality: Right;  . CATARACT EXTRACTION     01-27-2011 left, 02-17-2011 right   . CERVICAL LAMINECTOMY     1983  . cervical microdiskectomy     1990 Connie Lang  . CESAREAN SECTION    . COLONOSCOPY  03/21/2018   per Connie. Silverio Lang, adenomatous polyps, no repeats due to age   . EYE SURGERY     bil cataracts with lens implant  . FOOT ARTHRODESIS, MODIFIED MCBRIDE Right   . LUMBAR LAMINECTOMY     1990 Connie Lang  . MYELOGRAM    . POLYPECTOMY    . REFRACTIVE SURGERY     01-29-2009 04-07-2009 Connie Connie Lang in [place]  . spinal injection     07-30-2012, 09-24-2012  . TENDON REPAIR Right     rt wrist  . TOTAL HIP ARTHROPLASTY Right 05/13/2016   Procedure: RIGHT TOTAL HIP ARTHROPLASTY ANTERIOR APPROACH;  Surgeon: Connie Can, MD;  Location: [facility];  Service: Orthopedics;  Laterality: Right;  [place] RNFA  . TOTAL HIP ARTHROPLASTY Left 11/11/2016   Procedure: LEFT TOTAL HIP ARTHROPLASTY ANTERIOR APPROACH;  Surgeon: Connie Can, MD;  Location: [facility];  Service: Orthopedics;  Laterality: Left;  Connie. requesting RNFA  . [place]     x3  . WRIST SURGERY Right    tendon repair    Medications Prior to Admission  Medication Sig Dispense Refill Last Dose  . acetaminophen (TYLENOL) 650 MG CR tablet Take 650-1,300 mg by mouth every 8 (eight) hours as needed for pain.   08/06/2019 at 0430  . amLODipine (NORVASC) 5 MG tablet TAKE 1 TABLET BY MOUTH ONCE DAILY  IN THE EVENING (Patient taking differently: Take 5 mg by mouth daily. ) 90 tablet 0 08/04/2019 at Unknown time  . anti-nausea (EMETROL) solution Take 10 mLs by mouth every 15 (fifteen) minutes as needed for nausea or vomiting.   08/06/2019 at Unknown time  . Calcium Carb-Cholecalciferol (CALCIUM 600+D) 600-800 MG-UNIT TABS Take 1 tablet by mouth 2 (two) times daily.   08/05/2019 at Unknown time  . cyclobenzaprine (FLEXERIL) 10 MG tablet TAKE 1 TABLET BY MOUTH THREE TIMES DAILY AS NEEDED FOR MUSCLE SPASM (Patient taking differently: Take 10 mg by mouth 3 (three) times daily as needed (back pain.). ) 270 tablet 3 08/06/2019 at Unknown time  . diphenhydrAMINE (BENADRYL) 12.5 MG chewable tablet Chew 12.5 mg by mouth 2 (two) times daily as needed (for allergic reaction (lip swelling)).   Past Month at Unknown time  . furosemide (LASIX) 40 MG tablet Take 1 tablet by mouth twice daily (Patient taking differently: Take 40 mg by mouth 2 (two) times daily. ) 180 tablet 0 08/05/2019 at Unknown time  . guaifenesin (TUSSIN) 100 MG/5ML syrup Take 200 mg by mouth 3 (three) times daily as needed for cough.   Past Week at Unknown time  .  HYDROcodone-acetaminophen (NORCO/VICODIN) 5-325 MG tablet Take 1 tablet by mouth every 4 (four) hours as needed for moderate pain.   08/06/2019 at Unknown time  . ibuprofen (ADVIL,MOTRIN) 200 MG tablet Take 400 mg by mouth every 8 (eight) hours as needed (for pain).    08/03/2019  . losartan (COZAAR) 50 MG tablet Take 1 tablet by mouth once daily (Patient taking differently: Take 50 mg by mouth every evening. ) 90 tablet 0 08/05/2019 at Unknown time  . Multiple Vitamin (MULTIVITAMIN WITH MINERALS) TABS tablet Take 1 tablet by mouth daily at 3 pm. One-A-Day 65+    Past Week at Unknown time  . Multiple Vitamins-Minerals (PRESERVISION AREDS 2 PO) Take 1 tablet by mouth 2 (two) times daily.   Past Week at Unknown time  . oxymetazoline (AFRIN) 0.05 % nasal spray Place 1 spray into both nostrils at bedtime.    08/06/2019 at Unknown time  . pantoprazole (PROTONIX) 40 MG tablet Take 1 tablet by mouth once daily (Patient taking differently: Take 40 mg by mouth daily. ) 90 tablet 0 Past Week at Unknown time  . Polyethyl Glycol-Propyl Glycol (SYSTANE) 0.4-0.3 % SOLN Place 1 drop into both eyes 3 (three) times daily as needed (dry/irritated eyes.).   Past Week at Unknown time  . potassium chloride (K-DUR) 10 MEQ tablet TAKE 2 TABLETS BY MOUTH ONCE DAILY (Patient taking differently: Take 20 mEq by mouth daily in the afternoon. Mid afternoon) 180 tablet 3 08/05/2019 at Unknown time  . simvastatin (ZOCOR) 20 MG tablet TAKE 1 TABLET BY MOUTH AT BEDTIME (Patient taking differently: Take 20 mg by mouth at bedtime. ) 90 tablet 0 Past Week at Unknown time  . timolol (TIMOPTIC) 0.5 % ophthalmic solution Place 1 drop into both eyes 2 (two) times daily.   08/06/2019 at Unknown time  . traMADol (ULTRAM) 50 MG tablet Take 2 tablets (100 mg total) by mouth every 6 (six) hours as needed for moderate pain. 120 tablet 5 Past Week at Unknown time  . TRAVATAN Z 0.004 % SOLN ophthalmic solution Place 1 drop into both eyes at bedtime. 15 mL 0  08/05/2019 at Unknown time  . vitamin B-12 (CYANOCOBALAMIN) 1000 MCG tablet Take 2,000 mcg by mouth daily at 3 pm.   Past Week at Unknown  time  . aspirin 81 MG chewable tablet Chew 1 tablet (81 mg total) by mouth 2 (two) times daily. (Patient taking differently: Chew 81 mg by mouth every evening. ) 60 tablet 1 07/30/2019  . cephALEXin (KEFLEX) 500 MG capsule Take 1 capsule (500 mg total) by mouth 4 (four) times daily. (Patient not taking: Reported on 08/01/2019) 28 capsule 0 Not Taking at Unknown time  . chlorpheniramine (CHLOR-TRIMETON) 4 MG tablet Take 4 mg by mouth daily as needed for allergies.   More than a month at Unknown time  . nabumetone (RELAFEN) 500 MG tablet Take 1 tablet (500 mg total) by mouth 2 (two) times daily. (Patient taking differently: Take 500 mg by mouth 2 (two) times daily as needed (pain.). ) 180 tablet 3 Unknown at Unknown time  . Omega 3 1200 MG CAPS Take 2,400 mg by mouth daily in the afternoon.    08/02/2019   No Known Allergies  Social History   Tobacco Use  . Smoking status: Former Smoker    Packs/day: 0.50    Years: 36.00    Pack years: 18.00    Types: Cigarettes    Quit date: 07/05/1994    Years since quitting: 25.1  . Smokeless tobacco: Never Used  Substance Use Topics  . Alcohol use: Yes    Alcohol/week: 7.0 standard drinks    Types: 7 Glasses of wine per week    Comment: 08/02/2019- not currently- taking pain medications    Family History  Problem Relation Age of Onset  . Alzheimer's disease Father   . Heart failure Father        d. 53  . Stroke Mother 51  . Cancer Sister        ovarian  . Alzheimer's disease Paternal Aunt   . Breast cancer Paternal Aunt   . Colon cancer Brother        dx. 28s  . Stroke Maternal Aunt        d. 56s  . Heart disease Maternal Grandfather   . Diabetes Maternal Grandfather   . Stroke Paternal Grandmother 73  . Heart attack Paternal Grandfather        d. 75s  . Ovarian cancer Sister        dx. 42s; no genetic  testing  . Leukemia Grandchild 21  . Skin cancer Grandchild   . Cervical cancer Cousin 2       maternal 1st cousin  . Alzheimer's disease Paternal Aunt        (x4) paternal aunts  . Heart attack Paternal Uncle 38  . Colon polyps Neg Hx   . Rectal cancer Neg Hx   . Stomach cancer Neg Hx      Review of Systems Pertinent items noted in HPI and remainder of comprehensive ROS otherwise negative.  Objective: Vital signs in last 24 hours: Temp:  [98.3 F (36.8 C)] 98.3 F (36.8 C) (02/01 0724) Pulse Rate:  [90] 90 (02/01 0724) Resp:  [18] 18 (02/01 0724) BP: (164)/(81) 164/81 (02/01 0724) SpO2:  [97 %] 97 % (02/01 0724) Weight:  [90.7 kg] 90.7 kg (02/01 0724)  EXAM: Patient is a well-developed well-nourished white female in discomfort, but no acute distress.   Lungs are clear to auscultation , the patient has symmetrical respiratory excursion. Heart has a regular rate and rhythm normal S1 and S2 no murmur.   Abdomen is soft nontender nondistended bowel sounds are present. Extremity examination shows no clubbing cyanosis or edema.  There is tenderness  to palpation and percussion over the lower thoracic region, none over the upper thoracic region, none over the lumbar region.  Straight leg raising is negative bilaterally. Motor examination shows 5 over 5 strength in the lower extremities including the iliopsoas quadriceps dorsiflexor extensor hallicus  longus and plantar flexor bilaterally. Sensation is intact to pinprick in the distal lower extremities. Reflexes are symmetrical bilaterally. No pathologic reflexes are present. Patient has a normal gait and stance.  Data Review:CBC    Component Value Date/Time   WBC 12.5 (H) 08/02/2019 1051   RBC 4.14 08/02/2019 1051   HGB 13.9 08/02/2019 1051   HGB 13.8 12/10/2015 1237   HCT 40.7 08/02/2019 1051   HCT 40.8 12/10/2015 1237   PLT 300 08/02/2019 1051   PLT 210 12/10/2015 1237   MCV 98.3 08/02/2019 1051   MCV 101.0 12/10/2015 1237    MCH 33.6 08/02/2019 1051   MCHC 34.2 08/02/2019 1051   RDW 11.6 08/02/2019 1051   RDW 12.6 12/10/2015 1237   LYMPHSABS 2.2 01/23/2019 1057   LYMPHSABS 2.1 12/10/2015 1237   MONOABS 0.9 01/23/2019 1057   MONOABS 1.6 (H) 12/10/2015 1237   EOSABS 0.5 01/23/2019 1057   EOSABS 0.2 12/10/2015 1237   BASOSABS 0.1 01/23/2019 1057   BASOSABS 0.1 12/10/2015 1237                          BMET    Component Value Date/Time   NA 136 08/02/2019 1051   NA 141 12/10/2015 1237   K 3.7 08/02/2019 1051   K 3.9 12/10/2015 1237   CL 100 08/02/2019 1051   CO2 24 08/02/2019 1051   CO2 28 12/10/2015 1237   GLUCOSE 144 (H) 08/02/2019 1051   GLUCOSE 121 12/10/2015 1237   BUN 8 08/02/2019 1051   BUN 17.3 12/10/2015 1237   CREATININE 0.71 08/02/2019 1051   CREATININE 1.0 12/10/2015 1237   CALCIUM 9.2 08/02/2019 1051   CALCIUM 10.4 12/10/2015 1237   GFRNONAA >60 08/02/2019 1051   GFRAA >60 08/02/2019 1051     Assessment/Plan: Patient with disabling right thoracic radiculopathy with a lytic adenocarcinoma metastasis to the right side of T8, extending into the right pedicle and right T8-9 neuroforamen, with compression of the exiting right T8 nerve root, corresponding to her disabling right thoracic radiculopathy.  Patient is admitted now for thoracic laminectomy for subtotal resection of metastasis, with decompression of the right T8-9 neuroforamen and exiting right T8 nerve root and instrumented thoracic arthrodesis from T7-T9.  I have discussed with the patient and her husband the nature of her condition, the nature the surgical procedure, typical this hospital stay, and imitations postoperatively, as well as risks of surgical risk of infection, bleeding, possible need for transfusion, the risk of nerve root dysfunction with pain, weakness, numbness, or paresthesias, the risk of spinal cord dysfunction with paralysis of her lower extremities as well as of her bowel and bladder function, the risk of  anesthetic complications including myocardial infarction, stroke, pneumonia, and death, as well as the risk of pneumothorax and the possible need for placement of a chest tube.  Understanding all of this the patient would like to proceed with surgery as soon as feasible, and is admitted for such.  Hosie Spangle, MD 08/06/2019 9:39 AM

## 2019-08-06 NOTE — Telephone Encounter (Signed)
I see she has been admitted today for the spinal surgery, so they will take care of pain meds.

## 2019-08-06 NOTE — Op Note (Signed)
08/06/2019  3:12 PM  PATIENT:  Connie Lang  79 y.o. female  PRE-OPERATIVE DIAGNOSIS: T8 extradural metastasis, right T8 thoracic radiculopathy, adenocarcinoma of unknown primary  POST-OPERATIVE DIAGNOSIS:  T8 extradural metastasis, right T8 thoracic radiculopathy, adenocarcinoma of unknown primary  PROCEDURE:  Procedure(s): Right T8 laminectomy and facetectomy; resection of extradural T8 metastasis with microdissection, microsurgical technique, and the operating microscope; T7-T9 posterior thoracic arthrodesis with segmental radius posterior instrumentation and Vitoss BA with bone marrow aspirate  SURGEON: Jovita Gamma, MD  ASSISTANTS: Kary Kos, MD  ANESTHESIA:   general  EBL:  Total I/O In: 1900 [I.V.:1400; IV Piggyback:500] Out: 36 [Urine:260; Blood:200]  BLOOD ADMINISTERED:none  COUNT: Correct per nursing staff  SPECIMEN:  Source of Specimen:  T8 vertebra, thoracic epidural space, and right T8-9 neural foramen (sent for permanent pathology)  DICTATION: Patient was brought the operating room, placed under general endotracheal anesthesia.  Patient was turned to a prone position.  The thoracic and lumbar region was prepped with Betadine soap and solution and draped in sterile fashion.  The C-arm fluoroscope was draped and brought in the field, and the T7-T9 level was identified, and the midline infiltrated with local anesthetic with epinephrine.  Midline incision was made, carried down through the subcutaneous tissue.  Bipolar cautery and electrocautery were used to maintain hemostasis.  Dissection was carried down to the thoracic fascia, that was incised bilaterally, and the paraspinal musculature was dissected from the spinous processes and lamina in a subperiosteal fashion.  The C arm fluoroscope was brought back into the field, and pedicle entry points were identified bilaterally at T7, on the left side at T8, and bilaterally at T9.  Pilot holes were made, and then each of  the pedicles was probed with AP and lateral C-arm fluoroscopic guidance.  They were each examined with a ball probe, and good bony surfaces were found.  They were each tapped with a 4.25 mm tap, again exam with a ball probe, and good threading and bony surfaces were found.  We placed a pair of 4.75 x 40 mm screws bilaterally at T7, a 5.75 x 40 mm screw on the left side at T8, and a pair of 5.75 x 40 mm screws bilaterally at T9.  At the T9 level after the left pedicle was probed, bone marrow was aspirated from the vertebral body and injected over a 10 cc strip of Vitoss BA.  The operating microscope was then draped and brought into the field to provide additional magnification, illumination, and visualization for the tumor resection.  The high-speed drill was used to perform a right T8 laminectomy and right T8-9 facetectomy.  The tumor was identified, and the decompression extended.  The right T8 transverse process was mobilized, and tumor had replaced the right T8 pedicle.  Tumor was removed in a piecemeal fashion dissecting it from the lateral and ventral aspect of the spinal canal, through the right T8-9 neuroforamen, decompressing the exiting right T8 nerve root, and extending into the right lateral paraspinal tissues.  Significant destruction of the T8 vertebral body was found, with replacement in the lytic cavity with tumor, that was removed.  As we dissected the tumor from the right T8 nerve root, a small dural rent occurred, that was closed with a figure-of-eight 6-0 Prolene, with good watertight closure.  Adheris was applied over the closure.  In the end it was felt that good decompression of the spinal canal and thecal sac was achieved, and good decompression of the right T8-9 neuroforamen  and exiting right T8 nerve root was achieved.  Although the majority of the tumor was resected, a small amount of tumor remained adherent to the surrounding tissues, and therefore is felt that a subtotal resection was  achieved.  We establish hemostasis with bipolar cautery as well as Gelfoam and Surgifoam.  Once hemostasis was established, we completed the arthrodesis.  The left T7, T8, and T9 laminar surfaces were decorticated with a high-speed drill.  Vitoss BA with bone marrow aspirate was packed over the laminar surfaces.  We then selected 70 mm straight rods which were placed bilaterally.  5 locking caps were placed.  These were then locked down against a counter torque.  The wound was irrigated numerous times the procedure with saline solution.  Hemostasis was again confirmed, and we then proceeded with closure.  In separate layers the paraspinal muscles, deep fascia, and Scarpa's fascia were closed with interrupted undyed 1 Vicryl suture.  The subcutaneous and subcuticular closed with interrupted inverted 2-0 undyed Vicryl sutures.  Skin edges were approximate with Dermabond.  A dressing of sterile gauze and Hypafix was applied.  Following surgery the patient was turned back to a supine position, to be reversed from the anesthetic, extubated, and transferred to the recovery room for further care.  PLAN OF CARE: Admit to inpatient   PATIENT DISPOSITION:  PACU - hemodynamically stable.   Delay start of Pharmacological VTE agent (>24hrs) due to surgical blood loss or risk of bleeding:  yes

## 2019-08-06 NOTE — Anesthesia Procedure Notes (Signed)
Procedure Name: Intubation Date/Time: 08/06/2019 11:30 AM Performed by: Orlie Dakin, CRNA Pre-anesthesia Checklist: Emergency Drugs available, Patient identified, Suction available and Patient being monitored Patient Re-evaluated:Patient Re-evaluated prior to induction Oxygen Delivery Method: Circle system utilized Preoxygenation: Pre-oxygenation with 100% oxygen Induction Type: IV induction Ventilation: Mask ventilation without difficulty, Oral airway inserted - appropriate to patient size and Two handed mask ventilation required Laryngoscope Size: Miller and 3 Grade View: Grade II Tube type: Oral Tube size: 7.0 mm Number of attempts: 1 Airway Equipment and Method: Stylet Placement Confirmation: ETT inserted through vocal cords under direct vision,  positive ETCO2 and breath sounds checked- equal and bilateral Secured at: 22 cm Tube secured with: Tape Dental Injury: Teeth and Oropharynx as per pre-operative assessment

## 2019-08-06 NOTE — Progress Notes (Signed)
Vitals:   08/06/19 0724  BP: (!) 164/81  Pulse: 90  Resp: 18  Temp: 98.3 F (36.8 C)  TempSrc: Oral  SpO2: 97%  Weight: 90.7 kg  Height: 5\' 4"  (1.626 m)   Patient resting in preop bed 31.  Pre op for thoracic laminectomy for tumor resection and thoracic arthrodesis.  Patient has presented with an over 69-month history of disabling right thoracic radicular pain radiating around the right side of her torso.  Biopsy in interventional radiology revealed adenocarcinoma, primary is not known.  Dr. Belinda Block, the patient's anesthesiologist for today, has raised a concern because the preadmission nurse last week documented that the patient had shortness of breath as she walked back to the preadmission area.  Dr. Nyoka Cowden also noted the patient had previously seen Dr. Candee Furbish at [facility] (April 28, 2017) regarding a transient second degree heart block type I, Wenckebach and LBBB.  Dr. Marlou Porch noted a prior stress test, June 12, 2015, did not show any ischemia and that she had a normal ejection fraction previously.  He felt that her follow-up could be on an as-needed basis.  Speaking with the patient today she explained to me that she has never had shortness of breath, but does breathe somewhat shallowly, which is certainly understandable with the disabling right thoracic radicular pain that she is experiencing.  She explains that she lives in a two-story home, and goes up and down the stairs repeatedly throughout the day without difficulty.  She also has no difficulty walking around stores actively.  She denies any chest pain at any time other than for the radicular pain.  Plan: The case was discussed with Dr. Nyoka Cowden.  She is unwilling to proceed with general anesthesia for this patient without cardiology assessment.  I have contacted [employer] (the card master) requesting a urgent cardiology consultation so that we will be able to proceed with surgery expeditiously, so as to relieve  the patient's disabling radicular pain.  Hosie Spangle, MD 08/06/2019, 9:15 AM

## 2019-08-07 ENCOUNTER — Other Ambulatory Visit: Payer: Self-pay | Admitting: Radiation Therapy

## 2019-08-07 MED ORDER — HYDROCODONE-ACETAMINOPHEN 5-325 MG PO TABS: 1.0000 | ORAL_TABLET | ORAL | 0 refills | Status: DC | PRN

## 2019-08-07 NOTE — Discharge Summary (Signed)
Physician Discharge Summary  Patient ID: Connie Lang MRN: [id number] DOB/AGE: 80-01-1941 80 y.o.  Admit date: 08/06/2019 Discharge date: 08/07/2019  Admission Diagnoses:   T8 extradural metastasis, right T8 thoracic radiculopathy, adenocarcinoma of unknown primary  Discharge Diagnoses:  T8 extradural metastasis, right T8 thoracic radiculopathy, adenocarcinoma of unknown primary Active Problems:   Metastasis to spinal column [facility])   Discharged Condition: good  Hospital Course: Patient was admitted underwent thoracic laminectomy and resection of a metastatic adenocarcinoma (primary source unknown) and a T7-T9 posterior thoracic arthrodesis with instrumentation and bone graft.  Postoperatively she has made good progress.  She is up and ambulating actively.  Her Foley was DC'd and the nursing staff is monitoring her voiding function prior to discharge.  Her dressing is going to be removed prior to discharge, and she has been given instructions regarding wound care following discharge.  We have discussed activities and limitations through the postoperative period.  She is scheduled for follow-up with me in about 3 weeks with x-rays.  Pathology is pending at this time.  Discharge Exam: Blood pressure (!) 97/36, pulse 97, temperature 98.6 F (37 C), temperature source Oral, resp. rate 16, height 5\' 4"  (1.626 m), weight 90.7 kg, SpO2 96 %.  Disposition: Discharge disposition: 01-Home or Self Care       Discharge Instructions    Discharge wound care:   Complete by: As directed    Leave the wound open to air. Shower daily with the wound uncovered. Water and soapy water should run over the incision area. Do not wash directly on the incision for 2 weeks. Remove the glue after 2 weeks.   Driving Restrictions   Complete by: As directed    No driving for 2 weeks. May ride in the car locally now. May begin to drive locally in 2 weeks.   Other Restrictions   Complete by: As directed    Walk  gradually increasing distances out in the fresh air at least twice a day. Walking additional 6 times inside the house, gradually increasing distances, daily. No bending, lifting, or twisting. Perform activities between shoulder and waist height (that is at counter height when standing or table height when sitting).     Allergies as of 08/07/2019   No Known Allergies     Medication List    STOP taking these medications   cephALEXin 500 MG capsule Commonly known as: KEFLEX     TAKE these medications   acetaminophen 650 MG CR tablet Commonly known as: TYLENOL Take 650-1,300 mg by mouth every 8 (eight) hours as needed for pain.   amLODipine 5 MG tablet Commonly known as: NORVASC TAKE 1 TABLET BY MOUTH ONCE DAILY IN THE EVENING What changed: when to take this   anti-nausea solution Take 10 mLs by mouth every 15 (fifteen) minutes as needed for nausea or vomiting.   aspirin 81 MG chewable tablet Chew 1 tablet (81 mg total) by mouth 2 (two) times daily. What changed: when to take this   Calcium 600+D 600-800 MG-UNIT Tabs Generic drug: Calcium Carb-Cholecalciferol Take 1 tablet by mouth 2 (two) times daily.   chlorpheniramine 4 MG tablet Commonly known as: CHLOR-TRIMETON Take 4 mg by mouth daily as needed for allergies.   cyclobenzaprine 10 MG tablet Commonly known as: FLEXERIL TAKE 1 TABLET BY MOUTH THREE TIMES DAILY AS NEEDED FOR MUSCLE SPASM What changed:   reasons to take this  additional instructions   diphenhydrAMINE 12.5 MG chewable tablet Commonly known as: BENADRYL Chew  12.5 mg by mouth 2 (two) times daily as needed (for allergic reaction (lip swelling)).   furosemide 40 MG tablet Commonly known as: LASIX Take 1 tablet by mouth twice daily What changed: when to take this   HYDROcodone-acetaminophen 5-325 MG tablet Commonly known as: NORCO/VICODIN Take 1 tablet by mouth every 4 (four) hours as needed for moderate pain. What changed: Another medication with  the same name was added. Make sure you understand how and when to take each.   HYDROcodone-acetaminophen 5-325 MG tablet Commonly known as: NORCO/VICODIN Take 1-2 tablets by mouth every 4 (four) hours as needed (pain). What changed: You were already taking a medication with the same name, and this prescription was added. Make sure you understand how and when to take each.   ibuprofen 200 MG tablet Commonly known as: ADVIL Take 400 mg by mouth every 8 (eight) hours as needed (for pain).   losartan 50 MG tablet Commonly known as: COZAAR Take 1 tablet by mouth once daily What changed: when to take this   multivitamin with minerals Tabs tablet Take 1 tablet by mouth daily at 3 pm. One-A-Day 65+   nabumetone 500 MG tablet Commonly known as: RELAFEN Take 1 tablet (500 mg total) by mouth 2 (two) times daily. What changed:   when to take this  reasons to take this   Omega 3 1200 MG Caps Take 2,400 mg by mouth daily in the afternoon.   oxymetazoline 0.05 % nasal spray Commonly known as: AFRIN Place 1 spray into both nostrils at bedtime.   pantoprazole 40 MG tablet Commonly known as: PROTONIX Take 1 tablet by mouth once daily   potassium chloride 10 MEQ tablet Commonly known as: KLOR-CON TAKE 2 TABLETS BY MOUTH ONCE DAILY What changed:   how much to take  how to take this  when to take this  additional instructions   PRESERVISION AREDS 2 PO Take 1 tablet by mouth 2 (two) times daily.   simvastatin 20 MG tablet Commonly known as: ZOCOR TAKE 1 TABLET BY MOUTH AT BEDTIME   Systane 0.4-0.3 % Soln Generic drug: Polyethyl Glycol-Propyl Glycol Place 1 drop into both eyes 3 (three) times daily as needed (dry/irritated eyes.).   timolol 0.5 % ophthalmic solution Commonly known as: TIMOPTIC Place 1 drop into both eyes 2 (two) times daily.   traMADol 50 MG tablet Commonly known as: ULTRAM Take 2 tablets (100 mg total) by mouth every 6 (six) hours as needed for moderate  pain.   Travatan Z 0.004 % Soln ophthalmic solution Generic drug: Travoprost (BAK Free) Place 1 drop into both eyes at bedtime.   Tussin 100 MG/5ML syrup Generic drug: guaifenesin Take 200 mg by mouth 3 (three) times daily as needed for cough.   vitamin B-12 1000 MCG tablet Commonly known as: CYANOCOBALAMIN Take 2,000 mcg by mouth daily at 3 pm.            Discharge Care Instructions  (From admission, onward)         Start     Ordered   08/07/19 0000  Discharge wound care:    Comments: Leave the wound open to air. Shower daily with the wound uncovered. Water and soapy water should run over the incision area. Do not wash directly on the incision for 2 weeks. Remove the glue after 2 weeks.   08/07/19 0855           Signed: Hosie Spangle 08/07/2019, 8:55 AM

## 2019-08-07 NOTE — Discharge Instructions (Signed)
  Call Your Doctor If Any of These Occur Redness, drainage, or swelling at the wound.  Temperature greater than 101 degrees. Severe pain not relieved by pain medication. Increased difficulty swallowing. Incision starts to come apart. Follow Up Appt Call today for appointment in 3 weeks ([phone]) or for problems.  If you have any hardware placed in your spine, you will need an x-ray before your appointment.

## 2019-08-07 NOTE — Plan of Care (Signed)
Patient alert and oriented, mae's well, voiding adequate amount of urine, swallowing without difficulty, no c/o pain at time of discharge. Patient discharged home with family. Script and discharged instructions given to patient. Patient and family stated understanding of instructions given. Patient has an appointment with Dr. Nudelman 

## 2019-08-13 ENCOUNTER — Inpatient Hospital Stay: Payer: Medicare Other | Attending: Neurosurgery

## 2019-08-14 ENCOUNTER — Encounter: Payer: Self-pay | Admitting: *Deleted

## 2019-08-14 DIAGNOSIS — Z981 Arthrodesis status: Secondary | ICD-10-CM | POA: Diagnosis not present

## 2019-08-14 DIAGNOSIS — C7951 Secondary malignant neoplasm of bone: Secondary | ICD-10-CM | POA: Diagnosis not present

## 2019-08-14 DIAGNOSIS — M5134 Other intervertebral disc degeneration, thoracic region: Secondary | ICD-10-CM | POA: Diagnosis not present

## 2019-08-14 NOTE — Progress Notes (Signed)
Per MD request, successful [facility] Testing information faxed.

## 2019-08-15 ENCOUNTER — Other Ambulatory Visit: Payer: Self-pay | Admitting: Radiation Therapy

## 2019-08-15 DIAGNOSIS — C7949 Secondary malignant neoplasm of other parts of nervous system: Secondary | ICD-10-CM

## 2019-08-15 DIAGNOSIS — C7931 Secondary malignant neoplasm of brain: Secondary | ICD-10-CM

## 2019-08-16 ENCOUNTER — Telehealth: Payer: Self-pay | Admitting: Nurse Practitioner

## 2019-08-16 NOTE — Telephone Encounter (Signed)
Phone call to patient to verify medication list and allergies for myelogram procedure. Pt instructed to hold tramadol for 48hrs prior to myelogram appointment time. Pt verbalized understanding. Pre and post procedure instructions reviewed with pt. 

## 2019-08-21 NOTE — Progress Notes (Signed)
Histology and Location of Primary Cancer: T8  Patient was having pain in her upper abdomen that radiated around the right side of her torso.  She say her PCP and was recommended to go to the emergency room.  She was seen in the ER  06/13/2019 and renal CT was done to rule out kidney stone.  No stone found.  She was given antibiotic for suspected kidney infection.  She had no improvement and she returned to her PCP.  Ultrasound of her abdomen was done and was unremarkable.  Her husband found an area of tenderness in the lower thoracic region and they contacted Dr. Donnella Bi office.  Sites of Visceral and Bony Metastatic Disease: T8  Location(s) of Symptomatic Metastases: mid/lower back, continues to radiate.  Biopsies of soft tissue, T8   Past/Anticipated interventions by Neurosurgery, if any: Dr. Sherwood Gambler 08/13/2019 - She developed significant pain in the midline of the back at the area of surgery.   -She had limited mobility but has since become more comfortable and more active and mobile, walking around her home. -She does describe that in addition to the midline thoracic pain, she has some radicular symptoms with crawling like pain in a thoracic radicular pattern bilaterally. -The pathology again described metastatic adenocarcinoma with the only positive marker being CK7, which the pathologist explained again was nonspecific and could represent lung, breast, upper GI, renal, and so on. -I requested a CT of the chest to evaluate the lungs more thoroughly and I have also reached out to the radiation oncology department. 08/06/2019 - I reviewed the renal CT protocol and noted a lytic lesion involving the right side of the T8 vertebra, extending into the right T8 pedicle, suspicious for malignant lytic lesion extending into the right T8-9 neural foramen, which corresponds to her right thoracic radiculopathy. -I then obtained an MRI of the thoracic spine without and with contrast which confirmed a  tumor destroying the right side of the T8 vertebra extending into the right paraspinal soft tissues and the right T8-9 neural foramen compressing the exiting right T8 nerve root.  The interpreting radiologist raised the possibility of a plasmacytoma, and therefore a biopsy by interventional radiology was obtained.  -Pathology reports adenocarcinoma, but extensive fissural staining was positive only for CK7, and the interpreting pathologist felt it was nonspecific, and could represent breast, lung, upper GI, or renal source. -The patient is therefore now admitted for tumor subtotal resection via thoracic laminectomy.  Additional pathology specimens will be obtained at time of surgery.  Because of the significant associated back pain, a short segment arthrodesis from T7-T9 with instrumentation is planned as well. -Right T8 laminectomy and facetectomy, resection of an extradural right sided T8 metastasis and a T7-T9 posterior thoracic arthrodesis with radius posterior instrumentation and bone graft. 08/06/2019   Past/Anticipated chemotherapy by medical oncology, if any:  Dr. Lindi Adie 08/23/2019    Pain on a scale of 0-10 is: 7-8/10.  Has pressure when sitting up, laying down helps.   Ambulatory status? Walker? Wheelchair?: Ambulatory  SAFETY ISSUES:  Prior radiation? Yes to right breast, 20 fractions (42.5 Gy in 19fx, 7.5 Gy boost in 33fx)- Dr. Lisbeth Renshaw 2017  Pacemaker/ICD? No  Possible current pregnancy? Hysterectomy  Is the patient on methotrexate? No  Current Complaints / other details:   -Hx breast cancer, s/p right lumpectomy (Dr. Donne Hazel) high grade DCIS with comedonecrosis, - margins.  Adjuvant radiation

## 2019-08-22 ENCOUNTER — Ambulatory Visit
Admission: RE | Admit: 2019-08-22 | Discharge: 2019-08-22 | Disposition: A | Discharge status: Home / Self Care | Payer: Medicare Other | Source: Ambulatory Visit | Attending: Radiation Oncology | Admitting: Radiation Oncology

## 2019-08-22 ENCOUNTER — Ambulatory Visit
Admission: RE | Admit: 2019-08-22 | Discharge: 2019-08-22 | Disposition: A | Discharge status: Home / Self Care | Payer: Medicare Other | Source: Ambulatory Visit | Attending: Hematology and Oncology | Admitting: Hematology and Oncology

## 2019-08-22 ENCOUNTER — Encounter: Payer: Self-pay | Admitting: Radiation Oncology

## 2019-08-22 ENCOUNTER — Other Ambulatory Visit: Payer: Self-pay

## 2019-08-22 VITALS — Ht 64.0 in | Wt 200.0 lb

## 2019-08-22 DIAGNOSIS — C7951 Secondary malignant neoplasm of bone: Secondary | ICD-10-CM | POA: Diagnosis not present

## 2019-08-22 DIAGNOSIS — C801 Malignant (primary) neoplasm, unspecified: Secondary | ICD-10-CM

## 2019-08-22 DIAGNOSIS — Z923 Personal history of irradiation: Secondary | ICD-10-CM | POA: Diagnosis not present

## 2019-08-22 DIAGNOSIS — C50411 Malignant neoplasm of upper-outer quadrant of right female breast: Secondary | ICD-10-CM

## 2019-08-22 DIAGNOSIS — Z86000 Personal history of in-situ neoplasm of breast: Secondary | ICD-10-CM | POA: Diagnosis not present

## 2019-08-22 NOTE — Progress Notes (Signed)
  HEMATOLOGY-ONCOLOGY TELEPHONE VISIT PROGRESS NOTE  I connected with Connie Lang on 08/23/2019 at  1:30 PM EST by telephone and verified that I am speaking with the correct person using two identifiers.  I discussed the limitations, risks, security and privacy concerns of performing an evaluation and management service by telephone and the availability of in person appointments.  I also discussed with the patient that there may be a patient responsible charge related to this service. The patient expressed understanding and agreed to proceed.   History of Present Illness: Connie Lang is a 79 y.o. female with above-mentioned history of right breast cancer treated with lumpectomy, radiation, and who was on surveillance. She reported disabling right thoracic radicular pain for over 2 months. She underwent a biopsy that revealed adenocarcinoma of unknown primary. She then underwent a thoracic laminectomy and resection on 08/06/19 with Dr. Rita Ohara for which pathology showed metastatic adenocarcinoma, positive for CK7, with unknown origin but possibilities include mammary, lung and others. She presents over the phone today for reevaluation.   Oncology History  Breast cancer of upper-outer quadrant of right female breast ([place])  11/28/2015 Initial Diagnosis   Right breast biopsy [facility]: DCIS with necrosis and calcifications, high-grade, ER 0%, PR 0%, mammogram revealed right breast calcifications 1.4 cm, Tis N0 stage 0   12/25/2015 Surgery   Right lumpectomy ([place]): High-grade DCIS with comedonecrosis, margins negative, ER 0%, PR 0%   01/29/2016 - 02/25/2016 Radiation Therapy   Adjuvant radiation therapy Lisbeth Renshaw), Right breast: 42.5 Gy in 18fx, Right breast boost: 7.5 Gy in 76fx.   08/06/2019 Relapse/Recurrence   Patient reported disabling right thoracic radicular pain for over 2 months. Biopsy revealed adenocarcinoma of unknown primary. She underwent a thoracic laminectomy and resection on  08/06/19 with Dr. Rita Ohara for which pathology showed metastatic adenocarcinoma, positive for CK7, with unknown origin but possibilities include mammary, lung and others.      Observations/Objective:     Assessment Plan:  Breast cancer of upper-outer quadrant of right female breast ([place]) T8 extradural metastasis, right T8 thoracic radiculopathy, adenocarcinoma of unknown primary: Thoracic Laminectomy and resection of metastatic adenocarcinoma positive for cytokeratin-7 but negative for GATA-3, CDX2, PAX-8, TTF-1 and cytokeratin-20. The differential remains broad and includes lung and mammary carcinoma  (Right lumpectomy 12/25/2015: High-grade DCIS with comedonecrosis, margins negative, ER 0%, PR 0%)  Plan: 1. Obtain breast prognostic panel 2. [facility] Molecular testing for unknown primary and for mutation analysis 3. PET-CT scan 4. Mammograms  Agree with plan for Radiation to the spine. Plans will be finalized based on the test results. I will see her with one of her radiation treatments for follow-up.     I discussed the assessment and treatment plan with the patient. The patient was provided an opportunity to ask questions and all were answered. The patient agreed with the plan and demonstrated an understanding of the instructions. The patient was advised to call back or seek an in-person evaluation if the symptoms worsen or if the condition fails to improve as anticipated.   I provided 21 minutes of non-face-to-face time during this encounter.   Rulon Eisenmenger, MD 08/23/2019    I, Molly Dorshimer, am acting as scribe for Nicholas Lose, MD.  I have reviewed the above documentation for accuracy and completeness, and I agree with the above.

## 2019-08-22 NOTE — Progress Notes (Addendum)
Radiation Oncology         [phone] ________________________________  Outpatient Reconsultation - Conducted via telephone due to current COVID-19 concerns for limiting patient exposure  I spoke with the patient to conduct this consult visit via telephone to spare the patient unnecessary potential exposure in the healthcare setting during the current COVID-19 pandemic. The patient was notified in advance and was offered a [place] meeting to allow for face to face communication but unfortunately reported that they did not have the appropriate resources/technology to support such a visit and instead preferred to proceed with a telephone visit.   ________________________________  Name: Connie Lang        MRN: [id number]  Date of Service: 08/22/2019 DOB: Nov 05, 1940  CC:Fry, Ishmael Holter, MD  Connie Gamma, MD     REFERRING PHYSICIAN: Jovita Gamma, MD   DIAGNOSIS: The primary encounter diagnosis was Metastasis to spinal column ([place]). Diagnoses of Malignant neoplasm of upper-outer quadrant of right breast in female, estrogen receptor negative ([place]) and Adenocarcinoma of unknown primary [facility]) were also pertinent to this visit.   HISTORY OF PRESENT ILLNESS: Connie Lang is a 79 y.o. female seen at the request of Dr. Sherwood Gambler for a newly diagnosed metastatic adenocarcinoma involving the T8 spine.  The patient has a previous history of a basal cell cancer of the skin, as well as a history of ER/PR negative ductal carcinoma in situ of the right breast that was treated with lumpectomy and adjuvant radiotherapy.  The patient had normal mammography in June 2020.  She is followed by Dr. Quintella Reichert in medical oncology.  She states that several months ago she started experiencing bandlike discomfort in her abdomen, which led to several evaluations including chest x-ray, and abdominal ultrasound.  No definitive source of her pain was identified.  Her pain continued and she sought care with Dr.  Sherwood Gambler who has taken care of her for many years.  She described her symptoms, and further work-up of the thoracic spine began.  In fact the patient had had a CT renal protocol study on 06/13/2019 and in retrospect after Dr. Cleon Gustin view of the films, there was a suspicious lytic lesion at T8 vertebral body on the right side which could account for the patient's description of pain.  She underwent a CT-guided biopsy on 07/27/2019 which revealed a metastatic adenocarcinoma.  She underwent Thoracic laminectomy at T8 and resection of epidural disease with instrumentation on 08/06/2019.  Final pathology that accompanied the procedure includes metastatic adenocarcinoma in the thoracic extradural resection x2.  Given the findings the plan is to consider postoperative SRS to any residual tumor.  She is scheduled to undergo myelogram on Friday of this week and for CT simulation in our department Friday as well.  She is contacted today by telephone to discuss the rationale for stereotactic radiosurgery.  PREVIOUS RADIATION THERAPY: Yes   01/29/2016 through 02/25/2016: The patient initially received a dose of 42.5 Gy in 17 fractions to the breast using whole-breast tangent fields. This was delivered using a 3-D conformal technique. The patient then received a boost to the seroma. This delivered an additional 7.5 Gy in 3 fractions using an en face electron field due to the depth of the seroma. The total dose was 50 Gy.    PAST MEDICAL HISTORY:  Past Medical History:  Diagnosis Date  . Allergy    occasionally has hayfever  . Anxiety   . Breast cancer ([place]) 2017  . Cancer ([place]) 1987   cancerous  cells in vaginal wall seemed to be metastatic from the gut but no primary was ever found   . Cataract   . Chicken pox   . Depression   . DJD (degenerative joint disease)   . First degree AV block   . GERD (gastroesophageal reflux disease)   . Glaucoma    glaucoma and cataracts sees Dr Satira Sark   . Hyperlipemia   .  Hypertension    pt denies currently. " Prednisone made it high."  . LBBB (left bundle branch block)   . Lumbar disc disease    sees Dr. Sherwood Gambler   . Macular degeneration   . Mobitz type 1 second degree atrioventricular block   . Osteoarthritis   . Osteopenia   . Personal history of radiation therapy   . PMR (polymyalgia rheumatica) ([facility])    sees Dr. Leigh Aurora   . Radiation    02-25-16 last radiation for breast cancer  . Transfusion history    blood        PAST SURGICAL HISTORY: Past Surgical History:  Procedure Laterality Date  . ABDOMINAL HYSTERECTOMY     TAH BSO 1985  . APPENDECTOMY    . BASAL CELL CARCINOMA EXCISION     nose and rt thigh  . BREAST BIOPSY      x 4  . BREAST EXCISIONAL BIOPSY Right 1997  . BREAST EXCISIONAL BIOPSY Left 1985  . BREAST EXCISIONAL BIOPSY Left 1965  . BREAST EXCISIONAL BIOPSY Right 1959  . BREAST LUMPECTOMY Right 12/25/2015  . BREAST LUMPECTOMY  02/27/2018   see report  . BREAST LUMPECTOMY WITH RADIOACTIVE SEED LOCALIZATION Right 12/25/2015   Procedure: RIGHT BREAST LUMPECTOMY WITH RADIOACTIVE SEED LOCALIZATION;  Surgeon: Rolm Bookbinder, MD;  Location: [facility];  Service: General;  Laterality: Right;  . CATARACT EXTRACTION     01-27-2011 left, 02-17-2011 right   . CERVICAL LAMINECTOMY     1983  . cervical microdiskectomy     1990 Dr Connie Lang  . CESAREAN SECTION    . COLONOSCOPY  03/21/2018   per Dr. Silverio Decamp, adenomatous polyps, no repeats due to age   . EYE SURGERY     bil cataracts with lens implant  . FOOT ARTHRODESIS, MODIFIED MCBRIDE Right   . LUMBAR LAMINECTOMY     1990 Dr Connie Lang  . MYELOGRAM    . POLYPECTOMY    . REFRACTIVE SURGERY     01-29-2009 04-07-2009 Dr Foye Clock in [place]  . spinal injection     07-30-2012, 09-24-2012  . TENDON REPAIR Right    rt wrist  . TOTAL HIP ARTHROPLASTY Right 05/13/2016   Procedure: RIGHT TOTAL HIP ARTHROPLASTY ANTERIOR APPROACH;  Surgeon: Rod Can, MD;  Location: [facility];  Service: Orthopedics;  Laterality: Right;  [place] RNFA  . TOTAL HIP ARTHROPLASTY Left 11/11/2016   Procedure: LEFT TOTAL HIP ARTHROPLASTY ANTERIOR APPROACH;  Surgeon: Rod Can, MD;  Location: [facility];  Service: Orthopedics;  Laterality: Left;  Dr. requesting RNFA  . [place]     x3  . WRIST SURGERY Right    tendon repair     FAMILY HISTORY:  Family History  Problem Relation Age of Onset  . Alzheimer's disease Father   . Heart failure Father        d. 59  . Stroke Mother 62  . Cancer Sister        ovarian  . Alzheimer's disease Paternal Aunt   . Breast cancer  Paternal Aunt   . Colon cancer Brother        dx. 33s  . Stroke Maternal Aunt        d. 78s  . Heart disease Maternal Grandfather   . Diabetes Maternal Grandfather   . Stroke Paternal Grandmother 73  . Heart attack Paternal Grandfather        d. 76s  . Ovarian cancer Sister        dx. 75s; no genetic testing  . Breast cancer Daughter   . Leukemia Grandchild 21  . Skin cancer Grandchild   . Cervical cancer Cousin 5       maternal 1st cousin  . Alzheimer's disease Paternal Aunt        (x4) paternal aunts  . Heart attack Paternal Uncle 50  . Colon polyps Neg Hx   . Rectal cancer Neg Hx   . Stomach cancer Neg Hx      SOCIAL HISTORY:  reports that she quit smoking about 25 years ago. Her smoking use included cigarettes. She has a 18.00 pack-year smoking history. She has never used smokeless tobacco. She reports current alcohol use of about 7.0 standard drinks of alcohol per week. She reports that she does not use drugs.   ALLERGIES: Patient has no known allergies.   MEDICATIONS:  Current Outpatient Medications  Medication Sig Dispense Refill  . acetaminophen (TYLENOL) 650 MG CR tablet Take 650-1,300 mg by mouth every 8 (eight) hours as needed for pain.    Marland Kitchen amLODipine (NORVASC) 5 MG tablet TAKE 1 TABLET BY MOUTH ONCE DAILY IN THE EVENING (Patient  taking differently: Take 5 mg by mouth daily. ) 90 tablet 0  . anti-nausea (EMETROL) solution Take 10 mLs by mouth every 15 (fifteen) minutes as needed for nausea or vomiting.    . Calcium Carb-Cholecalciferol (CALCIUM 600+D) 600-800 MG-UNIT TABS Take 1 tablet by mouth 2 (two) times daily.    . chlorpheniramine (CHLOR-TRIMETON) 4 MG tablet Take 4 mg by mouth daily as needed for allergies.    . cyclobenzaprine (FLEXERIL) 10 MG tablet TAKE 1 TABLET BY MOUTH THREE TIMES DAILY AS NEEDED FOR MUSCLE SPASM (Patient taking differently: Take 10 mg by mouth 3 (three) times daily as needed (back pain.). ) 270 tablet 3  . diphenhydrAMINE (BENADRYL) 12.5 MG chewable tablet Chew 12.5 mg by mouth 2 (two) times daily as needed (for allergic reaction (lip swelling)).    . furosemide (LASIX) 40 MG tablet Take 1 tablet by mouth twice daily (Patient taking differently: Take 40 mg by mouth 2 (two) times daily. ) 180 tablet 0  . guaifenesin (TUSSIN) 100 MG/5ML syrup Take 200 mg by mouth 3 (three) times daily as needed for cough.    Marland Kitchen HYDROcodone-acetaminophen (NORCO/VICODIN) 5-325 MG tablet Take 1 tablet by mouth every 4 (four) hours as needed for moderate pain.    Marland Kitchen HYDROcodone-acetaminophen (NORCO/VICODIN) 5-325 MG tablet Take 1-2 tablets by mouth every 4 (four) hours as needed (pain). 40 tablet 0  . HYDROmorphone (DILAUDID) 4 MG tablet Take by mouth every 4 (four) hours as needed for severe pain.    Marland Kitchen ibuprofen (ADVIL,MOTRIN) 200 MG tablet Take 400 mg by mouth every 8 (eight) hours as needed (for pain).     Marland Kitchen losartan (COZAAR) 50 MG tablet Take 1 tablet by mouth once daily (Patient taking differently: Take 50 mg by mouth every evening. ) 90 tablet 0  . Multiple Vitamin (MULTIVITAMIN WITH MINERALS) TABS tablet Take 1 tablet by mouth daily at  3 pm. One-A-Day 65+     . Multiple Vitamins-Minerals (PRESERVISION AREDS 2 PO) Take 1 tablet by mouth 2 (two) times daily.    . nabumetone (RELAFEN) 500 MG tablet Take 1 tablet (500  mg total) by mouth 2 (two) times daily. (Patient taking differently: Take 500 mg by mouth 2 (two) times daily as needed (pain.). ) 180 tablet 3  . oxymetazoline (AFRIN) 0.05 % nasal spray Place 1 spray into both nostrils at bedtime.     . pantoprazole (PROTONIX) 40 MG tablet Take 1 tablet by mouth once daily (Patient taking differently: Take 40 mg by mouth daily. ) 90 tablet 0  . Polyethyl Glycol-Propyl Glycol (SYSTANE) 0.4-0.3 % SOLN Place 1 drop into both eyes 3 (three) times daily as needed (dry/irritated eyes.).    Marland Kitchen potassium chloride (K-DUR) 10 MEQ tablet TAKE 2 TABLETS BY MOUTH ONCE DAILY (Patient taking differently: Take 20 mEq by mouth daily in the afternoon. Mid afternoon) 180 tablet 3  . simvastatin (ZOCOR) 20 MG tablet TAKE 1 TABLET BY MOUTH AT BEDTIME (Patient taking differently: Take 20 mg by mouth at bedtime. ) 90 tablet 0  . timolol (TIMOPTIC) 0.5 % ophthalmic solution Place 1 drop into both eyes 2 (two) times daily.    . TRAVATAN Z 0.004 % SOLN ophthalmic solution Place 1 drop into both eyes at bedtime. 15 mL 0  . vitamin B-12 (CYANOCOBALAMIN) 1000 MCG tablet Take 2,000 mcg by mouth daily at 3 pm.    . aspirin 81 MG chewable tablet Chew 1 tablet (81 mg total) by mouth 2 (two) times daily. (Patient not taking: Reported on 08/22/2019) 60 tablet 1  . Omega 3 1200 MG CAPS Take 2,400 mg by mouth daily in the afternoon.     . traMADol (ULTRAM) 50 MG tablet Take 2 tablets (100 mg total) by mouth every 6 (six) hours as needed for moderate pain. (Patient not taking: Reported on 08/22/2019) 120 tablet 5   No current facility-administered medications for this encounter.     REVIEW OF SYSTEMS: On review of systems, the patient reports that she is having a hard time with managing her pain since surgery. She reports she is having pain that feels like pressure in the mid back and side, almost like a compressive pain and she states she feels like someone is standing on her shoulders. When she  stretches out laying down, this seems to relieve her symptoms. She denies any shortness of breath or cough, though she states she has always been told by providers that she appears short of breath. Apparently during a hospitalization she was sent home with an incentive spirometer and states she finds relief of any rapid breathing when she uses this, but denies a known history of lung disease or history of seeing a pulmonologist. She  denies any chest pain, fevers, chills, night sweats, unintended weight changes. She denies any bowel or bladder disturbances, and denies abdominal pain, nausea or vomiting. She denies any other new musculoskeletal or joint aches or pains. A complete review of systems is obtained and is otherwise negative.     PHYSICAL EXAM:  Pain Assessment Pain Score: 7  Pain Loc: (Mid/lower back)/10  Unable to assess due to encounter type.   ECOG = 1  0 - Asymptomatic (Fully active, able to carry on all predisease activities without restriction)  1 - Symptomatic but completely ambulatory (Restricted in physically strenuous activity but ambulatory and able to carry out work of a light or sedentary nature.  For example, light housework, office work)  2 - Symptomatic, <50% in bed during the day (Ambulatory and capable of all self care but unable to carry out any work activities. Up and about more than 50% of waking hours)  3 - Symptomatic, >50% in bed, but not bedbound (Capable of only limited self-care, confined to bed or chair 50% or more of waking hours)  4 - Bedbound (Completely disabled. Cannot carry on any self-care. Totally confined to bed or chair)  5 - Death   Connie Lang MM, Connie Lang, Connie Lang, et al. [phone]). "Toxicity and response criteria of the [facility]". [place] Oncol. 5 (6): 649-55    LABORATORY DATA:  Lab Results  Component Value Date   WBC 12.5 (H) 08/02/2019   HGB 13.9 08/02/2019   HCT 40.7 08/02/2019   MCV 98.3 08/02/2019    PLT 300 08/02/2019   Lab Results  Component Value Date   NA 136 08/02/2019   K 3.7 08/02/2019   CL 100 08/02/2019   CO2 24 08/02/2019   Lab Results  Component Value Date   ALT 51 (H) 06/13/2019   AST 66 (H) 06/13/2019   ALKPHOS 119 06/13/2019   BILITOT 1.0 06/13/2019      RADIOGRAPHY: DG Thoracic Spine 2 View  Result Date: 08/06/2019 CLINICAL DATA:  Thoracic laminectomy and fusion EXAM: THORACIC SPINE 2 VIEWS; DG C-ARM 1-60 MIN COMPARISON:  None. FINDINGS: Multiple intraoperative fluoroscopic spot images are provided. Interval posterior lumbar fusion of the lower thoracic spine. FLUOROSCOPY TIME:  2 minutes 14 seconds IMPRESSION: Intraoperative localization. Electronically Signed   By: Connie Lang   On: 08/06/2019 15:15   CT BIOPSY  Result Date: 07/27/2019 INDICATION: 79 year old with history of breast cancer. Patient has a destructive lesion involving the right side of the T8 vertebral body. EXAM: CT-GUIDED BIOPSY OF T8 VERTEBRAL LESION MEDICATIONS: None. ANESTHESIA/SEDATION: Moderate (conscious) sedation was employed during this procedure. A total of Versed 2.0 mg and Fentanyl 75 mcg was administered intravenously. Moderate Sedation Time: 26 minutes. The patient's level of consciousness and vital signs were monitored continuously by radiology nursing throughout the procedure under my direct supervision. FLUOROSCOPY TIME:  None COMPLICATIONS: None immediate. PROCEDURE: Informed written consent was obtained from the patient after a thorough discussion of the procedural risks, benefits and alternatives. All questions were addressed. A timeout was performed prior to the initiation of the procedure. Patient was placed prone on the CT scanner. Images of the thoracic spine were obtained. The lesion involving the right side of the T8 vertebrae was identified. Skin was prepped with chlorhexidine and sterile field was created. Skin and soft tissues were anesthetized with 1% lidocaine. Using CT  guidance, a 17 gauge coaxial needle was advanced through the T8 right posterior cortex along the right pedicle. The needle was advanced into the soft tissue lesion. Three soft tissue core biopsies were obtained with an 18 gauge core device. 17 gauge needle was removed without complication. Bandage placed over the puncture site. FINDINGS: Low-density lesion involving the right side of the T8 vertebral body and right pedicle. Needle position confirmed within the soft tissue lesion. Three core biopsies were obtained and placed in formalin. IMPRESSION: CT-guided core biopsies of the T8 vertebral lesion. Electronically Signed   By: Connie Lang M.D.   On: 07/27/2019 17:39   DG C-Arm 1-60 Min  Result Date: 08/06/2019 CLINICAL DATA:  Thoracic laminectomy and fusion EXAM: THORACIC SPINE 2 VIEWS; DG C-ARM 1-60 MIN COMPARISON:  None.  FINDINGS: Multiple intraoperative fluoroscopic spot images are provided. Interval posterior lumbar fusion of the lower thoracic spine. FLUOROSCOPY TIME:  2 minutes 14 seconds IMPRESSION: Intraoperative localization. Electronically Signed   By: Connie Lang   On: 08/06/2019 15:15       IMPRESSION/PLAN: 1. Metastatic adenocarcinoma involving the T8 vertebral body.  Dr. Lisbeth Lang discusses the findings from her recent surgery and reviews her imaging studies today.  He reviewed the rationale for proceeding with a CT myelogram in order to plan.  Radiation to the spine.  He describes SBRT style treatment which is very focused and the goals would be to definitively treat any remaining cells or tumor in the T8 vertebral body.  He discussed the risks, benefits, short and long-term effects of radiotherapy as it differs from her prior therapy as well, and reviews the logistics of this treatment.  She will come in on Friday of this week following her myelogram to proceed with simulation and at that time will signed written consent.  We did discuss that we would anticipate following her over the long-term  in our brain and spine oncology program where her case would be reviewed at each surveillance schedule in conference.  At the end of the conversation she is interested in moving forward. She will return next Friday on 08/31/19 for single fraction treatment. Again it is unclear the source of her disease, I have spoken with Dr. Lindi Adie about her case and he plans to see her tomorrow he anticipates ordering pet imaging to try to identify the primary site of disease.  Further systemic therapy will be determined by this. 2. 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 Dr. Lisbeth Lang reviews that given her recent normal mammography and early stage disease, it would be less likely that her spinal disease is a results of this history, however we will follow along with further work up. She sees Dr. Lindi Adie tomorrow. 3. Postoperative pain.  We encouraged the patient to follow-up with Dr. Sherwood Gambler as we are unsure how he would approach management, and if her symptoms are anticipated given the surgery she had or more problematic.   Given current concerns for patient exposure during the COVID-19 pandemic, this encounter was conducted via telephone.  The patient has provided two factor identification and has given verbal consent for this type of encounter and has been advised to only accept a meeting of this type in a secure network environment. The time spent during this encounter was 60 minutes including preparation, discussion, and coordination of the patient's care. The attendants for this meeting include Blenda Nicely, RN, Dr. Lisbeth Lang, Hayden Pedro  and Spero Geralds.  During the encounter,  Blenda Nicely, RN, Dr. Lisbeth Lang, and Hayden Pedro were located at [facility] Radiation Oncology Department.  KAMLA SKILTON was located at home.   The above documentation reflects my direct findings during this shared patient visit. Please see the separate note by Dr. Lisbeth Lang on this date for  the remainder of the patient's plan of care.    Carola Rhine, PAC

## 2019-08-23 ENCOUNTER — Inpatient Hospital Stay (HOSPITAL_BASED_OUTPATIENT_CLINIC_OR_DEPARTMENT_OTHER): Payer: Medicare Other | Admitting: Hematology and Oncology

## 2019-08-23 ENCOUNTER — Encounter: Payer: Self-pay | Admitting: *Deleted

## 2019-08-23 DIAGNOSIS — Z171 Estrogen receptor negative status [ER-]: Secondary | ICD-10-CM | POA: Diagnosis not present

## 2019-08-23 DIAGNOSIS — C50411 Malignant neoplasm of upper-outer quadrant of right female breast: Secondary | ICD-10-CM

## 2019-08-23 DIAGNOSIS — C801 Malignant (primary) neoplasm, unspecified: Secondary | ICD-10-CM | POA: Insufficient documentation

## 2019-08-23 MED ORDER — DEXAMETHASONE 1 MG PO TABS: 1.0000 mg | ORAL_TABLET | Freq: Every day | ORAL | 3 refills | Status: DC

## 2019-08-23 NOTE — Progress Notes (Signed)
MD request breast prognostic panel on pathology obtained on 08/06/19.  RN placed call to [facility] Pathology requesting breast prognostic panel and they verbalized understanding stating they will run the panel.

## 2019-08-23 NOTE — Assessment & Plan Note (Signed)
T8 extradural metastasis, right T8 thoracic radiculopathy, adenocarcinoma of unknown primary: Thoracic Laminectomy and resection of metastatic adenocarcinoma positive for cytokeratin-7 but negative for GATA-3, CDX2, PAX-8, TTF-1 and cytokeratin-20. The differential remains broad and includes lung and mammary carcinoma  (Right lumpectomy 12/25/2015: High-grade DCIS with comedonecrosis, margins negative, ER 0%, PR 0%)  Plan: 1. Obtain breast prognostic panel 2. [facility] Molecular testing for unknown primary and for mutation analysis 3. PET-CT scan 4. Brain MRI  Agree with plan for Radiation to the spine. Plans will be finalized based on the test results.

## 2019-08-23 NOTE — Addendum Note (Signed)
Encounter addended by: Hayden Pedro, PA-C on: 08/23/2019 11:25 AM  Actions taken: Clinical Note Signed

## 2019-08-24 ENCOUNTER — Telehealth: Payer: Self-pay | Admitting: Hematology and Oncology

## 2019-08-24 ENCOUNTER — Other Ambulatory Visit: Payer: Self-pay | Admitting: Neurosurgery

## 2019-08-24 ENCOUNTER — Ambulatory Visit: Payer: Medicare Other | Admitting: Radiation Oncology

## 2019-08-24 ENCOUNTER — Ambulatory Visit
Admission: RE | Admit: 2019-08-24 | Discharge: 2019-08-24 | Disposition: A | Discharge status: Home / Self Care | Payer: Medicare Other | Source: Ambulatory Visit | Attending: Radiation Oncology | Admitting: Radiation Oncology

## 2019-08-24 ENCOUNTER — Other Ambulatory Visit: Payer: Medicare Other

## 2019-08-24 ENCOUNTER — Inpatient Hospital Stay: Admission: RE | Admit: 2019-08-24 | Payer: Medicare Other | Source: Ambulatory Visit

## 2019-08-24 DIAGNOSIS — Z510 Encounter for antineoplastic radiation therapy: Secondary | ICD-10-CM | POA: Insufficient documentation

## 2019-08-24 DIAGNOSIS — C7949 Secondary malignant neoplasm of other parts of nervous system: Secondary | ICD-10-CM | POA: Insufficient documentation

## 2019-08-24 DIAGNOSIS — Z853 Personal history of malignant neoplasm of breast: Secondary | ICD-10-CM | POA: Insufficient documentation

## 2019-08-24 DIAGNOSIS — C3490 Malignant neoplasm of unspecified part of unspecified bronchus or lung: Secondary | ICD-10-CM

## 2019-08-24 DIAGNOSIS — C7951 Secondary malignant neoplasm of bone: Secondary | ICD-10-CM

## 2019-08-24 NOTE — Telephone Encounter (Signed)
I talk with patient regarding schedule  

## 2019-08-27 ENCOUNTER — Ambulatory Visit
Admission: RE | Admit: 2019-08-27 | Discharge: 2019-08-27 | Disposition: A | Discharge status: Home / Self Care | Payer: Medicare Other | Source: Ambulatory Visit | Attending: Radiation Oncology | Admitting: Radiation Oncology

## 2019-08-27 ENCOUNTER — Ambulatory Visit: Admission: RE | Admit: 2019-08-27 | Payer: Medicare Other | Source: Ambulatory Visit | Admitting: Radiation Oncology

## 2019-08-27 VITALS — BP 141/69 | HR 88

## 2019-08-27 DIAGNOSIS — M5134 Other intervertebral disc degeneration, thoracic region: Secondary | ICD-10-CM | POA: Diagnosis not present

## 2019-08-27 DIAGNOSIS — C7949 Secondary malignant neoplasm of other parts of nervous system: Secondary | ICD-10-CM

## 2019-08-27 DIAGNOSIS — C7951 Secondary malignant neoplasm of bone: Secondary | ICD-10-CM | POA: Diagnosis not present

## 2019-08-27 DIAGNOSIS — C7931 Secondary malignant neoplasm of brain: Secondary | ICD-10-CM

## 2019-08-27 DIAGNOSIS — M4804 Spinal stenosis, thoracic region: Secondary | ICD-10-CM | POA: Diagnosis not present

## 2019-08-27 DIAGNOSIS — Z853 Personal history of malignant neoplasm of breast: Secondary | ICD-10-CM | POA: Diagnosis not present

## 2019-08-27 DIAGNOSIS — Z510 Encounter for antineoplastic radiation therapy: Secondary | ICD-10-CM | POA: Diagnosis not present

## 2019-08-27 DIAGNOSIS — M48061 Spinal stenosis, lumbar region without neurogenic claudication: Secondary | ICD-10-CM | POA: Diagnosis not present

## 2019-08-27 MED ORDER — DIAZEPAM 5 MG PO TABS
5.0000 mg | ORAL_TABLET | Freq: Once | ORAL | Status: AC
  Administered 2019-08-27: 5 mg via ORAL

## 2019-08-27 MED ORDER — IOPAMIDOL (ISOVUE-M 300) INJECTION 61%
10.0000 mL | Freq: Once | INTRAMUSCULAR | Status: AC | PRN
  Administered 2019-08-27: 14:00:00 10 mL via INTRATHECAL

## 2019-08-27 NOTE — Progress Notes (Signed)
Patient states she has been off Tramadol for at least the past two weeks.

## 2019-08-27 NOTE — Discharge Instructions (Signed)
Myelogram Discharge Instructions  1. Go home and rest quietly for the next 24 hours.  It is important to lie flat for the next 24 hours.  Get up only to go to the restroom.  You may lie in the bed or on a couch on your back, your stomach, your left side or your right side.  You may have one pillow under your head.  You may have pillows between your knees while you are on your side or under your knees while you are on your back.  2. DO NOT drive today.  Recline the seat as far back as it will go, while still wearing your seat belt, on the way home.  3. You may get up to go to the bathroom as needed.  You may sit up for 10 minutes to eat.  You may resume your normal diet and medications unless otherwise indicated.  Drink lots of extra fluids today and tomorrow.  4. The incidence of headache, nausea, or vomiting is about 5% (one in 20 patients).  If you develop a headache, lie flat and drink plenty of fluids until the headache goes away.  Caffeinated beverages may be helpful.  If you develop severe nausea and vomiting or a headache that does not go away with flat bed rest, call [phone].  5. You may resume normal activities after your 24 hours of bed rest is over; however, do not exert yourself strongly or do any heavy lifting tomorrow. If when you get up you have a headache when standing, go back to bed and force fluids for another 24 hours.  6. Call your physician for a follow-up appointment.  The results of your myelogram will be sent directly to your physician by the following day.  7. If you have any questions or if complications develop after you arrive home, please call [phone].  Discharge instructions have been explained to the patient.  The patient, or the person responsible for the patient, fully understands these instructions  YOU MAY RESTART YOUR TRAMADOL TOMORROW 08/28/2019 AT 1:30PM

## 2019-08-28 ENCOUNTER — Ambulatory Visit (HOSPITAL_COMMUNITY)
Admission: RE | Admit: 2019-08-28 | Discharge: 2019-08-28 | Disposition: A | Discharge status: Home / Self Care | Payer: Medicare Other | Source: Ambulatory Visit | Attending: Neurosurgery | Admitting: Neurosurgery

## 2019-08-28 ENCOUNTER — Other Ambulatory Visit: Payer: Self-pay

## 2019-08-28 DIAGNOSIS — C3490 Malignant neoplasm of unspecified part of unspecified bronchus or lung: Secondary | ICD-10-CM | POA: Diagnosis not present

## 2019-08-28 DIAGNOSIS — C7951 Secondary malignant neoplasm of bone: Secondary | ICD-10-CM | POA: Diagnosis not present

## 2019-08-28 MED ORDER — IOHEXOL 300 MG/ML  SOLN
75.0000 mL | Freq: Once | INTRAMUSCULAR | Status: AC | PRN
  Administered 2019-08-28: 17:00:00 75 mL via INTRAVENOUS

## 2019-08-30 DIAGNOSIS — C7949 Secondary malignant neoplasm of other parts of nervous system: Secondary | ICD-10-CM | POA: Diagnosis not present

## 2019-08-30 DIAGNOSIS — Z510 Encounter for antineoplastic radiation therapy: Secondary | ICD-10-CM | POA: Diagnosis not present

## 2019-08-30 DIAGNOSIS — Z853 Personal history of malignant neoplasm of breast: Secondary | ICD-10-CM | POA: Diagnosis not present

## 2019-08-30 DIAGNOSIS — C7951 Secondary malignant neoplasm of bone: Secondary | ICD-10-CM | POA: Diagnosis not present

## 2019-08-31 ENCOUNTER — Encounter: Payer: Self-pay | Admitting: Radiation Oncology

## 2019-08-31 ENCOUNTER — Ambulatory Visit
Admission: RE | Admit: 2019-08-31 | Discharge: 2019-08-31 | Disposition: A | Discharge status: Home / Self Care | Payer: Medicare Other | Source: Ambulatory Visit | Attending: Radiation Oncology | Admitting: Radiation Oncology

## 2019-08-31 ENCOUNTER — Other Ambulatory Visit: Payer: Self-pay

## 2019-08-31 VITALS — BP 151/88 | HR 98 | Temp 97.8°F | Resp 20

## 2019-08-31 DIAGNOSIS — Z853 Personal history of malignant neoplasm of breast: Secondary | ICD-10-CM | POA: Diagnosis not present

## 2019-08-31 DIAGNOSIS — C7951 Secondary malignant neoplasm of bone: Secondary | ICD-10-CM | POA: Diagnosis not present

## 2019-08-31 DIAGNOSIS — Z510 Encounter for antineoplastic radiation therapy: Secondary | ICD-10-CM | POA: Diagnosis not present

## 2019-08-31 DIAGNOSIS — C7949 Secondary malignant neoplasm of other parts of nervous system: Secondary | ICD-10-CM | POA: Diagnosis not present

## 2019-08-31 NOTE — Progress Notes (Signed)
Patient rested with [place] for 30 minutes following her SRS treatment.  Patient denies pain and fatigue. Patient without complaints. Understands to avoid strenuous activity for the next 24 hours and call [phone] with needs.  BP (!) 151/88 (BP Location: Left Arm, Patient Position: Sitting, Cuff Size: Large)   Pulse 98   Temp 97.8 F (36.6 C)   Resp 20   LMP  (LMP Unknown) Comment: full  SpO2 95%    Connie Lang M. Leonie Green, BSN

## 2019-08-31 NOTE — Op Note (Signed)
   Name: Connie Lang  MRN: [id number]  Date: 08/31/2019   DOB: 09-18-1940  Stereotactic Radiosurgery Operative Note  PRE-OPERATIVE DIAGNOSIS:  T8 Spinal Metastasis  POST-OPERATIVE DIAGNOSIS:  T8 Spinal Metastasis  PROCEDURE:  Stereotactic Radiosurgery  SURGEON:  Hosie Spangle, MD  NARRATIVE: The patient underwent a radiation treatment planning session in the radiation oncology simulation suite under the care of the radiation oncology physician and physicist.  I participated remotely via Webex in the radiation treatment planning afterwards. The patient underwent planning CT myelogram which was fused to the MRI.  These images were fused on the planning system.  Radiation oncology contoured the gross target volume and subsequently expanded this to yield the Planning Target Volume. I actively participated in the planning process.  I helped to define and review the target contours and also the contours of the spinal cord, and selected nearby organs at risk.  All the dose constraints for critical structures were reviewed and compared to AAPM Task Group 101.  The prescription dose conformity was reviewed.  I approved the plan electronically.    Accordingly, Spero Geralds was brought to the TrueBeam stereotactic radiation treatment linac and placed in the custom immobilization device.  The patient was aligned according to the IR fiducial markers with BrainLab Exactrac, then orthogonal x-rays were used in ExacTrac with the 6DOF robotic table and the shifts were made to align the patient.  Then conebeam CT was performed to verify precision.  Spero Geralds received stereotactic radiosurgery uneventfully.  The detailed description of the procedure is recorded in the radiation oncology procedure note.  I was present for the duration of the procedure.  DISPOSITION:  Following delivery, the patient was transported to nursing in stable condition and monitored for possible acute effects to be  discharged to home in stable condition with follow-up in one month.  Hosie Spangle, MD 08/31/2019 3:13 PM

## 2019-09-03 ENCOUNTER — Other Ambulatory Visit: Payer: Self-pay | Admitting: Family Medicine

## 2019-09-05 ENCOUNTER — Other Ambulatory Visit: Payer: Medicare Other

## 2019-09-05 ENCOUNTER — Ambulatory Visit (HOSPITAL_COMMUNITY)
Admission: RE | Admit: 2019-09-05 | Discharge: 2019-09-05 | Disposition: A | Discharge status: Home / Self Care | Payer: Medicare Other | Source: Ambulatory Visit | Attending: Hematology and Oncology | Admitting: Hematology and Oncology

## 2019-09-05 ENCOUNTER — Other Ambulatory Visit: Payer: Self-pay

## 2019-09-05 DIAGNOSIS — R918 Other nonspecific abnormal finding of lung field: Secondary | ICD-10-CM | POA: Diagnosis not present

## 2019-09-05 DIAGNOSIS — C7951 Secondary malignant neoplasm of bone: Secondary | ICD-10-CM | POA: Diagnosis not present

## 2019-09-05 DIAGNOSIS — Z171 Estrogen receptor negative status [ER-]: Secondary | ICD-10-CM | POA: Insufficient documentation

## 2019-09-05 DIAGNOSIS — C50411 Malignant neoplasm of upper-outer quadrant of right female breast: Secondary | ICD-10-CM | POA: Diagnosis not present

## 2019-09-05 DIAGNOSIS — C50919 Malignant neoplasm of unspecified site of unspecified female breast: Secondary | ICD-10-CM | POA: Diagnosis not present

## 2019-09-05 LAB — GLUCOSE, CAPILLARY: Glucose-Capillary: 138 mg/dL — ABNORMAL HIGH (ref 70–99)

## 2019-09-05 MED ORDER — FLUDEOXYGLUCOSE F - 18 (FDG) INJECTION
9.3400 | Freq: Once | INTRAVENOUS | Status: AC | PRN
  Administered 2019-09-05: 9.34 via INTRAVENOUS

## 2019-09-06 ENCOUNTER — Other Ambulatory Visit: Payer: Self-pay | Admitting: Radiation Therapy

## 2019-09-06 ENCOUNTER — Ambulatory Visit (HOSPITAL_COMMUNITY): Admission: RE | Admit: 2019-09-06 | Payer: Medicare Other | Source: Ambulatory Visit

## 2019-09-07 ENCOUNTER — Ambulatory Visit
Admission: RE | Admit: 2019-09-07 | Discharge: 2019-09-07 | Disposition: A | Discharge status: Home / Self Care | Payer: Medicare Other | Source: Ambulatory Visit | Attending: Hematology and Oncology | Admitting: Hematology and Oncology

## 2019-09-07 ENCOUNTER — Other Ambulatory Visit: Payer: Self-pay

## 2019-09-07 DIAGNOSIS — Z171 Estrogen receptor negative status [ER-]: Secondary | ICD-10-CM

## 2019-09-07 DIAGNOSIS — R922 Inconclusive mammogram: Secondary | ICD-10-CM | POA: Diagnosis not present

## 2019-09-07 DIAGNOSIS — C50411 Malignant neoplasm of upper-outer quadrant of right female breast: Secondary | ICD-10-CM

## 2019-09-07 DIAGNOSIS — Z853 Personal history of malignant neoplasm of breast: Secondary | ICD-10-CM | POA: Diagnosis not present

## 2019-09-09 NOTE — Progress Notes (Signed)
Patient Care Team: Laurey Morale, MD as PCP - General Jerline Pain, MD as PCP - Cardiology (Cardiology) Marygrace Drought, MD (Ophthalmology) Jovita Gamma, MD (Neurosurgery)  DIAGNOSIS:    ICD-10-CM   1. Malignant neoplasm of upper-outer quadrant of right breast in female, estrogen receptor negative ([place])  C50.411    Z17.1     SUMMARY OF ONCOLOGIC HISTORY: Oncology History  Breast cancer of upper-outer quadrant of right female breast ([place])  11/28/2015 Initial Diagnosis   Right breast biopsy UOQ: DCIS with necrosis and calcifications, high-grade, ER 0%, PR 0%, mammogram revealed right breast calcifications 1.4 cm, Tis N0 stage 0   12/25/2015 Surgery   Right lumpectomy ([place]): High-grade DCIS with comedonecrosis, margins negative, ER 0%, PR 0%   01/29/2016 - 02/25/2016 Radiation Therapy   Adjuvant radiation therapy Lisbeth Renshaw), Right breast: 42.5 Gy in 88f, Right breast boost: 7.5 Gy in 330f   08/06/2019 Relapse/Recurrence   Patient reported disabling right thoracic radicular pain for over 2 months. Biopsy revealed adenocarcinoma of unknown primary. She underwent a thoracic laminectomy and resection on 08/06/19 with Dr. NuRita Oharaor which pathology showed metastatic adenocarcinoma, positive for CK7, with unknown origin but possibilities include mammary, lung and others.      CHIEF COMPLIANT: Follow-up of metastatic cancer to discuss treatment options  INTERVAL HISTORY: Connie TYNDALLs a 89.o. with above-mentioned history of right DCIS who was diagnosed with vertebral body metastases and she underwent vertebroplasty.  Pathology revealed adenocarcinoma of unknown primary.  PET scan on 09/05/19 showed bone metastases in the T8 and T9 vertebra, a left mid lung mass, 3.2cm, compatible with pulmonary metastasis or primary pulmonary neoplasm, and possible left hilar lymph node metastasis. Mammogram on 09/07/19 showed no evidence of malignancy bilaterally. She presents to the clinic  today to discuss treatment options.  Molecular testing with [facility] revealed that this is 98% lung adenocarcinoma.  PD-L1 20% positive.  She is being referred to see Dr. MoJulien Nordmannor systemic treatment options.  ALLERGIES:  has No Known Allergies.  MEDICATIONS:  Current Outpatient Medications  Medication Sig Dispense Refill  . acetaminophen (TYLENOL) 650 MG CR tablet Take 650-1,300 mg by mouth every 8 (eight) hours as needed for pain.    . Marland KitchenmLODipine (NORVASC) 5 MG tablet TAKE 1 TABLET BY MOUTH ONCE DAILY IN THE EVENING 90 tablet 0  . anti-nausea (EMETROL) solution Take 10 mLs by mouth every 15 (fifteen) minutes as needed for nausea or vomiting.    . Marland Kitchenspirin 81 MG chewable tablet Chew 1 tablet (81 mg total) by mouth 2 (two) times daily. (Patient not taking: Reported on 08/22/2019) 60 tablet 1  . Calcium Carb-Cholecalciferol (CALCIUM 600+D) 600-800 MG-UNIT TABS Take 1 tablet by mouth 2 (two) times daily.    . chlorpheniramine (CHLOR-TRIMETON) 4 MG tablet Take 4 mg by mouth daily as needed for allergies.    . cyclobenzaprine (FLEXERIL) 10 MG tablet Take 1 tablet by mouth three times daily as needed for muscle spasm 270 tablet 0  . diphenhydrAMINE (BENADRYL) 12.5 MG chewable tablet Chew 12.5 mg by mouth 2 (two) times daily as needed (for allergic reaction (lip swelling)).    . furosemide (LASIX) 40 MG tablet Take 1 tablet by mouth twice daily (Patient taking differently: Take 40 mg by mouth 2 (two) times daily. ) 180 tablet 0  . gabapentin (NEURONTIN) 300 MG capsule Take 1 capsule (300 mg total) by mouth at bedtime. 30 capsule 3  . guaifenesin (TUSSIN) 100 MG/5ML syrup Take 200  mg by mouth 3 (three) times daily as needed for cough.    Marland Kitchen HYDROcodone-acetaminophen (NORCO/VICODIN) 5-325 MG tablet Take 1-2 tablets by mouth every 4 (four) hours as needed (pain). 60 tablet 0  . ibuprofen (ADVIL,MOTRIN) 200 MG tablet Take 400 mg by mouth every 8 (eight) hours as needed (for pain).     Marland Kitchen losartan (COZAAR) 50 MG  tablet Take 1 tablet by mouth once daily (Patient taking differently: Take 50 mg by mouth every evening. ) 90 tablet 0  . Multiple Vitamin (MULTIVITAMIN WITH MINERALS) TABS tablet Take 1 tablet by mouth daily at 3 pm. One-A-Day 65+     . Multiple Vitamins-Minerals (PRESERVISION AREDS 2 PO) Take 1 tablet by mouth 2 (two) times daily.    . nabumetone (RELAFEN) 500 MG tablet Take 1 tablet (500 mg total) by mouth 2 (two) times daily. (Patient taking differently: Take 500 mg by mouth 2 (two) times daily as needed (pain.). ) 180 tablet 3  . Omega 3 1200 MG CAPS Take 2,400 mg by mouth daily in the afternoon.     Marland Kitchen oxymetazoline (AFRIN) 0.05 % nasal spray Place 1 spray into both nostrils at bedtime.     . pantoprazole (PROTONIX) 40 MG tablet Take 1 tablet by mouth once daily (Patient taking differently: Take 40 mg by mouth daily. ) 90 tablet 0  . Polyethyl Glycol-Propyl Glycol (SYSTANE) 0.4-0.3 % SOLN Place 1 drop into both eyes 3 (three) times daily as needed (dry/irritated eyes.).    Marland Kitchen potassium chloride (K-DUR) 10 MEQ tablet TAKE 2 TABLETS BY MOUTH ONCE DAILY (Patient taking differently: Take 20 mEq by mouth daily in the afternoon. Mid afternoon) 180 tablet 3  . simvastatin (ZOCOR) 20 MG tablet TAKE 1 TABLET BY MOUTH AT BEDTIME (Patient taking differently: Take 20 mg by mouth at bedtime. ) 90 tablet 0  . timolol (TIMOPTIC) 0.5 % ophthalmic solution Place 1 drop into both eyes 2 (two) times daily.    . traMADol (ULTRAM) 50 MG tablet Take 2 tablets (100 mg total) by mouth every 6 (six) hours as needed for moderate pain. (Patient not taking: Reported on 08/22/2019) 120 tablet 5  . TRAVATAN Z 0.004 % SOLN ophthalmic solution Place 1 drop into both eyes at bedtime. 15 mL 0  . vitamin B-12 (CYANOCOBALAMIN) 1000 MCG tablet Take 2,000 mcg by mouth daily at 3 pm.     No current facility-administered medications for this visit.    PHYSICAL EXAMINATION: ECOG PERFORMANCE STATUS: 2 - Symptomatic, <50% confined to  bed  Vitals:   09/10/19 1533  BP: (!) 141/77  Pulse: (!) 102  Resp: 17  Temp: 97.8 F (36.6 C)  SpO2: 96%   Filed Weights   09/10/19 1533  Weight: 189 lb 11.2 oz (86 kg)    LABORATORY DATA:  I have reviewed the data as listed CMP Latest Ref Rng & Units 08/02/2019 06/13/2019 01/23/2019  Glucose 70 - 99 mg/dL 144(H) 148(H) 136(H)  BUN 8 - 23 mg/dL 8 11 16   Creatinine 0.44 - 1.00 mg/dL 0.71 0.75 0.87  Sodium 135 - 145 mmol/L 136 139 139  Potassium 3.5 - 5.1 mmol/L 3.7 3.7 4.5  Chloride 98 - 111 mmol/L 100 101 97  CO2 22 - 32 mmol/L 24 26 29   Calcium 8.9 - 10.3 mg/dL 9.2 9.2 10.2  Total Protein 6.5 - 8.1 g/dL - 7.0 7.0  Total Bilirubin 0.3 - 1.2 mg/dL - 1.0 0.8  Alkaline Phos 38 - 126 U/L - 119 125(H)  AST 15 - 41 U/L - 66(H) 71(H)  ALT 0 - 44 U/L - 51(H) 54(H)    Lab Results  Component Value Date   WBC 12.5 (H) 08/02/2019   HGB 13.9 08/02/2019   HCT 40.7 08/02/2019   MCV 98.3 08/02/2019   PLT 300 08/02/2019   NEUTROABS 5.6 01/23/2019    ASSESSMENT & PLAN:  Breast cancer of upper-outer quadrant of right female breast ([place]) T8 extradural metastasis, right T8 thoracic radiculopathy, adenocarcinoma of unknown primary: Thoracic Laminectomy and resection of metastatic adenocarcinoma positive for cytokeratin-7 but negative for GATA-3, CDX2, PAX-8, TTF-1 and cytokeratin-20. The differential remains broad and includes lung and mammary carcinoma  (Right lumpectomy 12/25/2015: High-grade DCIS with comedonecrosis, margins negative, ER 0%, PR 0%)  PET CT scan 09/05/2019: Intense FDG uptake associated the lytic bone metastases involving T8 and T9 and left iliac bone.  Left mid lung mass 3.2 cm.  Mild uptake in the left hilar node Mammogram 09/07/2019: No evidence of malignancy  Discussion: [facility] molecular testing revealed that adenocarcinoma is 98% lung.  PET CT scan also showed a lung nodule with hilar lymph node.  This confirms that this is metastatic lung cancer.  I referred her to  see Dr. Julien Nordmann in the thoracic oncology team to talk about treatment options.  With PD-L1 being positive I suspect that she would be eligible for immunotherapy. Additional testing may need to be done for lung primary including EGFR and ALK mutations.  Severe back pain: I refilled her prescription for Percocets. I instructed her to taper off and discontinue steroids.  I will not be seeing her in the future.  She will be under the care of Dr. Julien Nordmann.  No orders of the defined types were placed in this encounter.  The patient has a good understanding of the overall plan. she agrees with it. she will call with any problems that may develop before the next visit here.  Total time spent: 30 mins including face to face time and time spent for planning, charting and coordination of care  Nicholas Lose, MD 09/10/2019  I, Cloyde Reams Dorshimer, am acting as scribe for Dr. Nicholas Lose.  I have reviewed the above documentation for accuracy and completeness, and I agree with the above.

## 2019-09-10 ENCOUNTER — Ambulatory Visit: Payer: Medicare Other | Admitting: Physician Assistant

## 2019-09-10 ENCOUNTER — Ambulatory Visit: Payer: Medicare Other | Admitting: Hematology and Oncology

## 2019-09-10 ENCOUNTER — Other Ambulatory Visit: Payer: Self-pay

## 2019-09-10 ENCOUNTER — Telehealth: Payer: Self-pay | Admitting: *Deleted

## 2019-09-10 ENCOUNTER — Inpatient Hospital Stay: Payer: Medicare Other | Attending: Hematology and Oncology | Admitting: Hematology and Oncology

## 2019-09-10 DIAGNOSIS — M549 Dorsalgia, unspecified: Secondary | ICD-10-CM | POA: Diagnosis not present

## 2019-09-10 DIAGNOSIS — C7800 Secondary malignant neoplasm of unspecified lung: Secondary | ICD-10-CM | POA: Diagnosis not present

## 2019-09-10 DIAGNOSIS — Z791 Long term (current) use of non-steroidal anti-inflammatories (NSAID): Secondary | ICD-10-CM | POA: Diagnosis not present

## 2019-09-10 DIAGNOSIS — M353 Polymyalgia rheumatica: Secondary | ICD-10-CM | POA: Insufficient documentation

## 2019-09-10 DIAGNOSIS — I10 Essential (primary) hypertension: Secondary | ICD-10-CM | POA: Diagnosis not present

## 2019-09-10 DIAGNOSIS — Z86000 Personal history of in-situ neoplasm of breast: Secondary | ICD-10-CM | POA: Insufficient documentation

## 2019-09-10 DIAGNOSIS — Z923 Personal history of irradiation: Secondary | ICD-10-CM | POA: Diagnosis not present

## 2019-09-10 DIAGNOSIS — Z171 Estrogen receptor negative status [ER-]: Secondary | ICD-10-CM | POA: Diagnosis not present

## 2019-09-10 DIAGNOSIS — E785 Hyperlipidemia, unspecified: Secondary | ICD-10-CM | POA: Insufficient documentation

## 2019-09-10 DIAGNOSIS — K219 Gastro-esophageal reflux disease without esophagitis: Secondary | ICD-10-CM | POA: Diagnosis not present

## 2019-09-10 DIAGNOSIS — C7951 Secondary malignant neoplasm of bone: Secondary | ICD-10-CM | POA: Insufficient documentation

## 2019-09-10 DIAGNOSIS — Z8041 Family history of malignant neoplasm of ovary: Secondary | ICD-10-CM | POA: Insufficient documentation

## 2019-09-10 DIAGNOSIS — H3530 Unspecified macular degeneration: Secondary | ICD-10-CM | POA: Insufficient documentation

## 2019-09-10 DIAGNOSIS — H409 Unspecified glaucoma: Secondary | ICD-10-CM | POA: Insufficient documentation

## 2019-09-10 DIAGNOSIS — C50411 Malignant neoplasm of upper-outer quadrant of right female breast: Secondary | ICD-10-CM | POA: Diagnosis not present

## 2019-09-10 DIAGNOSIS — Z87891 Personal history of nicotine dependence: Secondary | ICD-10-CM | POA: Diagnosis not present

## 2019-09-10 DIAGNOSIS — Z7982 Long term (current) use of aspirin: Secondary | ICD-10-CM | POA: Diagnosis not present

## 2019-09-10 DIAGNOSIS — Z85828 Personal history of other malignant neoplasm of skin: Secondary | ICD-10-CM | POA: Diagnosis not present

## 2019-09-10 DIAGNOSIS — Z79899 Other long term (current) drug therapy: Secondary | ICD-10-CM | POA: Diagnosis not present

## 2019-09-10 DIAGNOSIS — M8580 Other specified disorders of bone density and structure, unspecified site: Secondary | ICD-10-CM | POA: Diagnosis not present

## 2019-09-10 DIAGNOSIS — C801 Malignant (primary) neoplasm, unspecified: Secondary | ICD-10-CM

## 2019-09-10 MED ORDER — HYDROCODONE-ACETAMINOPHEN 5-325 MG PO TABS: 1.0000 | ORAL_TABLET | ORAL | 0 refills | Status: DC | PRN

## 2019-09-10 MED ORDER — GABAPENTIN 300 MG PO CAPS: 300.0000 mg | ORAL_CAPSULE | Freq: Every day | ORAL | 3 refills | Status: AC

## 2019-09-10 NOTE — Telephone Encounter (Signed)
Oncology Nurse Navigator Documentation  Oncology Nurse Navigator Flowsheets 09/10/2019  Abnormal Finding Date -  Confirmed Diagnosis Date -  Expected Surgery Date -  Navigator Location [facility]  Referral Date to RadOnc/MedOnc 09/10/2019  Navigator Encounter Type Telephone/I received referral on Ms. McIntoch today.  He would like her to see thoracic team.  I called and updated her.  She verbalized understanding of appt time and place.   Telephone Outgoing Call  Treatment Initiated Date -  Patient Visit Type -  Treatment Phase Abnormal Scans  Barriers/Navigation Needs Coordination of Care;Education  Education Other  Interventions Coordination of Care;Education  Acuity Level 2-Minimal Needs (1-2 Barriers Identified)  Coordination of Care Appts  Education Method Verbal  Time Spent with Patient 30

## 2019-09-10 NOTE — Assessment & Plan Note (Signed)
T8 extradural metastasis, right T8 thoracic radiculopathy, adenocarcinoma of unknown primary: Thoracic Laminectomy and resection of metastatic adenocarcinoma positive for cytokeratin-7 but negative for GATA-3, CDX2, PAX-8, TTF-1 and cytokeratin-20. The differential remains broad and includes lung and mammary carcinoma  (Right lumpectomy 12/25/2015: High-grade DCIS with comedonecrosis, margins negative, ER 0%, PR 0%)  PET CT scan 09/05/2019: Intense FDG uptake associated the lytic bone metastases involving T8 and T9 and left iliac bone.  Left mid lung mass 3.2 cm.  Mild uptake in the left hilar node Mammogram 09/07/2019: No evidence of malignancy  Discussion: I do not believe that the patient has metastatic breast cancer.  It is suspicious for metastatic lung cancer.  We will try to obtain a biopsy of the lung mass. Awaiting the results of [facility] molecular testing as well as breast prognostic panel.

## 2019-09-10 NOTE — Progress Notes (Signed)
[facility] pathology notified for request of ER/PR and HER 2 request for accession number [id number]

## 2019-09-11 ENCOUNTER — Inpatient Hospital Stay: Payer: Medicare Other

## 2019-09-12 DIAGNOSIS — H43813 Vitreous degeneration, bilateral: Secondary | ICD-10-CM | POA: Diagnosis not present

## 2019-09-12 DIAGNOSIS — H04123 Dry eye syndrome of bilateral lacrimal glands: Secondary | ICD-10-CM | POA: Diagnosis not present

## 2019-09-12 DIAGNOSIS — H401131 Primary open-angle glaucoma, bilateral, mild stage: Secondary | ICD-10-CM | POA: Diagnosis not present

## 2019-09-12 DIAGNOSIS — H353134 Nonexudative age-related macular degeneration, bilateral, advanced atrophic with subfoveal involvement: Secondary | ICD-10-CM | POA: Diagnosis not present

## 2019-09-12 NOTE — Addendum Note (Signed)
Encounter addended by: Kyung Rudd, MD on: 09/12/2019 12:05 PM  Actions taken: Visit diagnoses modified, Clinical Note Signed

## 2019-09-12 NOTE — Progress Notes (Signed)
  Radiation Oncology         [phone]) [phone] ________________________________  Name: [name] MRN: [id number]  Date: 08/24/2019  DOB: [birth year]  SIMULATION AND TREATMENT PLANNING NOTE  DIAGNOSIS:     ICD-10-CM   1. Spine metastasis ([place])  C79.51     Site:  Spine at the T8 level  NARRATIVE:  The patient was brought to the [facility].  Identity was confirmed.  All relevant records and images related to the planned course of therapy were reviewed.   Written consent to proceed with treatment was confirmed which was freely given after reviewing the details related to the planned course of therapy had been reviewed with the patient.  Then, the patient was set-up in a stable reproducible supine position for radiation therapy.  CT images were obtained.  Surface markings were placed.    Medically necessary complex treatment device(s) for immobilization:  1.  customized body-fix device, 2.  customized accuform device.   The CT images were loaded into the planning software.  Then the target and avoidance structures were contoured.  Treatment planning then occurred.  The radiation prescription was entered and confirmed.   The patient will receive a course of stereotactic radiosurgery to the spine. I am therefore requesting a 3-D conformal technique. The target volume has been contoured in addition to the spinal cord and surrounding critical at risk organs.  Dose volume histograms of each of these structures will be carefully reviewed as part of the 3-D conformal technique.   PLAN:  The patient will receive 18 Gy in 1 fraction(s).  ________________________________   Jodelle Gross, MD, PhD

## 2019-09-12 NOTE — Progress Notes (Signed)
  Radiation Oncology         (336) [phone] ________________________________  Name: Connie Lang MRN: [id number]  Date: 08/31/2019  DOB: 03/17/1941   SPECIAL TREATMENT PROCEDURE   3D TREATMENT PLANNING AND DOSIMETRY: The patient's radiation plan was reviewed and approved by Dr. Rolene Arbour from neurosurgery and radiation oncology prior to treatment. It showed 3-dimensional radiation distributions overlaid onto the planning CT/MRI image set. The [facility] for the target structures as well as the organs at risk were reviewed. The documentation of the 3D plan and dosimetry are filed in the radiation oncology EMR.   NARRATIVE: The patient was brought to the TrueBeam stereotactic radiation treatment machine and placed supine on the CT couch. The head frame was applied, and the patient was set up for stereotactic radiosurgery. Neurosurgery was present for the set-up and delivery   SIMULATION VERIFICATION: In the couch zero-angle position, the patient underwent Exactrac imaging using the Brainlab system with orthogonal KV images. These were carefully aligned and repeated to confirm treatment position for each of the isocenters. The Exactrac snap film verification was repeated at each couch angle.   SPECIAL TREATMENT PROCEDURE: The patient received stereotactic radiosurgery to the following target:  PTV1 T8 target was treated using 5 Arcs to a prescription dose of 18 Gy. ExacTrac Snap verification was performed for each couch angle.   STEREOTACTIC TREATMENT MANAGEMENT: Following delivery, the patient was transported to nursing in stable condition and monitored for possible acute effects. Vital signs were recorded . The patient tolerated treatment without significant acute effects, and was discharged to home in stable condition.  PLAN: Follow-up in one month.   ------------------------------------------------  Jodelle Gross, MD, PhD

## 2019-09-13 ENCOUNTER — Encounter: Payer: Self-pay | Admitting: Physician Assistant

## 2019-09-13 ENCOUNTER — Other Ambulatory Visit: Payer: Self-pay

## 2019-09-13 ENCOUNTER — Telehealth: Payer: Self-pay | Admitting: *Deleted

## 2019-09-13 ENCOUNTER — Other Ambulatory Visit: Payer: Self-pay | Admitting: *Deleted

## 2019-09-13 ENCOUNTER — Inpatient Hospital Stay: Payer: Medicare Other | Admitting: Physician Assistant

## 2019-09-13 VITALS — BP 129/61 | HR 96 | Temp 98.2°F | Resp 18 | Ht 64.0 in | Wt 190.3 lb

## 2019-09-13 DIAGNOSIS — Z86000 Personal history of in-situ neoplasm of breast: Secondary | ICD-10-CM | POA: Diagnosis not present

## 2019-09-13 DIAGNOSIS — Z923 Personal history of irradiation: Secondary | ICD-10-CM | POA: Diagnosis not present

## 2019-09-13 DIAGNOSIS — C7800 Secondary malignant neoplasm of unspecified lung: Secondary | ICD-10-CM | POA: Diagnosis not present

## 2019-09-13 DIAGNOSIS — C7951 Secondary malignant neoplasm of bone: Secondary | ICD-10-CM | POA: Diagnosis not present

## 2019-09-13 DIAGNOSIS — Z79899 Other long term (current) drug therapy: Secondary | ICD-10-CM | POA: Diagnosis not present

## 2019-09-13 DIAGNOSIS — Z7982 Long term (current) use of aspirin: Secondary | ICD-10-CM | POA: Diagnosis not present

## 2019-09-13 DIAGNOSIS — C3492 Malignant neoplasm of unspecified part of left bronchus or lung: Secondary | ICD-10-CM

## 2019-09-13 DIAGNOSIS — Z7189 Other specified counseling: Secondary | ICD-10-CM | POA: Insufficient documentation

## 2019-09-13 DIAGNOSIS — M549 Dorsalgia, unspecified: Secondary | ICD-10-CM | POA: Diagnosis not present

## 2019-09-13 DIAGNOSIS — M8580 Other specified disorders of bone density and structure, unspecified site: Secondary | ICD-10-CM | POA: Diagnosis not present

## 2019-09-13 DIAGNOSIS — C3490 Malignant neoplasm of unspecified part of unspecified bronchus or lung: Secondary | ICD-10-CM

## 2019-09-13 DIAGNOSIS — I10 Essential (primary) hypertension: Secondary | ICD-10-CM | POA: Diagnosis not present

## 2019-09-13 DIAGNOSIS — H409 Unspecified glaucoma: Secondary | ICD-10-CM | POA: Diagnosis not present

## 2019-09-13 DIAGNOSIS — Z5111 Encounter for antineoplastic chemotherapy: Secondary | ICD-10-CM

## 2019-09-13 DIAGNOSIS — H3530 Unspecified macular degeneration: Secondary | ICD-10-CM | POA: Diagnosis not present

## 2019-09-13 DIAGNOSIS — K219 Gastro-esophageal reflux disease without esophagitis: Secondary | ICD-10-CM | POA: Diagnosis not present

## 2019-09-13 DIAGNOSIS — Z791 Long term (current) use of non-steroidal anti-inflammatories (NSAID): Secondary | ICD-10-CM | POA: Diagnosis not present

## 2019-09-13 DIAGNOSIS — Z171 Estrogen receptor negative status [ER-]: Secondary | ICD-10-CM | POA: Diagnosis not present

## 2019-09-13 DIAGNOSIS — E785 Hyperlipidemia, unspecified: Secondary | ICD-10-CM | POA: Diagnosis not present

## 2019-09-13 DIAGNOSIS — M353 Polymyalgia rheumatica: Secondary | ICD-10-CM | POA: Diagnosis not present

## 2019-09-13 MED ORDER — FOLIC ACID 1 MG PO TABS: 1.0000 mg | ORAL_TABLET | Freq: Every day | ORAL | 2 refills | Status: DC

## 2019-09-13 MED ORDER — PROCHLORPERAZINE MALEATE 10 MG PO TABS: 10.0000 mg | ORAL_TABLET | Freq: Four times a day (QID) | ORAL | 2 refills | Status: DC | PRN

## 2019-09-13 NOTE — Progress Notes (Signed)
[place] [phone]   [phone]  CONSULT NOTE  REFERRING PHYSICIAN: Dr. Lindi Adie   REASON FOR CONSULTATION:  Stage IV non-small cell lung cancer, adenocarcinoma.  HPI Connie Lang is a 79 y.o. 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  The patient symptoms first presented in late November 2020 with a band like distribution of upper abdominal pain that radiated to her back.  She was evaluated in the emergency room on 06/13/2019 and had a CT renal protocol study performed.  No source of her pain was identified at that time.  She had several other studies performed in the interval such as an upper abdominal ultrasound by her PCP. The patient then had been doing research and came to the realization her pain was likely coming from her back. It was not until the patient's husband was palpating the patient's back and found an area of tenderness in the thoracic region and subsequently contacted Dr. Sherwood Gambler, neurosurgeon, for evaluation. Dr. Sherwood Gambler identified a suspicious lytic lesion on the T8 vertebral body upon viewing her CT renal study.  In July 26, 2019 the patient had a CT-guided biopsy of the T8 vertebral lesion which was consistent with adenocarcinoma.  At that time, the immunostains were nonspecific and the differential diagnosis included breast, upper GI tract, pancreaticobiliary, or lung primary.  On August 06, 2019 the patient had a tumor subtotal resection via thoracic laminectomy under the care of Dr. Sherwood Gambler.  The patient was subsequently referred to radiation oncology and had SRS on  August 31, 2019 to this region.   The patient previously followed with medical oncologist, Dr. Lindi Adie, for her history of DCIS. Of note, she had a diagnostic bilateral mammogram performed on 09/03/19 which did not show evidence of malignancy in the breasts bilaterally. Dr. Lindi Adie performed [facility] molecular testing which revealed that her biopsy is 98% likely to be consistent with primary lung adenocarcinoma.  Her PDL 1 expression is 20% and she has no actionable mutations.  Dr. Lindi Adie arrange for an initial staging PET scan which showed a 3.2 cm left mid lung mass that extends across the oblique fissure as well as intense FDG uptake with the associated lytic bone metastasis involving T8 and T9 vertebrae and the left iliac bone.  The patient was then referred to medical oncologist, Dr. Julien Nordmann, for further management of her condition.  Today, the patient is feeling fairly well except for persistent discomfort since her laminectomy. She is prescribed Norco for her pain which she only takes if absolutely needed, usually at night. She denies any recent fever, chills, night sweats, or weight loss.  She denies any shortness of breath, chest pain, cough, or hemoptysis.  She denies any nausea, vomiting, or diarrhea. She reports baseline intermittent constipation which has been going on for several years. She denies any headaches. She is experiencing progressive blindness secondary to macular degeneration.   Her family history significant for a mother with macular degeneration and passed away from a cerebral hemorrhage.  The patient's father had heart disease.  The patient's brother had colorectal cancer and she has a younger sister who had ovarian cancer.   The patient is married and has 4 children.  She denies  any history of drug abuse.  Patient smoked less than 1 pack/day for approximately 36 years.  She quit in 1996.  The patient occasionally will drink wine once every few months.  HPI  Past Medical History:    Diagnosis Date   Allergy    occasionally has hayfever   Anxiety    Breast cancer ([place]) 2017   Cancer ([place]) 1987   cancerous cells in vaginal wall seemed to be metastatic from the gut but no primary was ever found    Cataract    Chicken pox    Depression    DJD (degenerative joint disease)    First degree AV block    GERD (gastroesophageal reflux disease)    Glaucoma    glaucoma and cataracts sees Dr Satira Sark    Hyperlipemia    Hypertension    pt denies currently. " Prednisone made it high."   LBBB (left bundle branch block)    Lumbar disc disease    sees Dr. Sherwood Gambler    Macular degeneration    Mobitz type 1 second degree atrioventricular block    Osteoarthritis    Osteopenia    Personal history of radiation therapy    PMR (polymyalgia rheumatica) ([place])    sees Dr. Leigh Aurora    Radiation    02-25-16 last radiation for breast cancer   Transfusion history    blood     Past Surgical History:  Procedure Laterality Date   ABDOMINAL HYSTERECTOMY     TAH BSO 1985   APPENDECTOMY     BASAL CELL CARCINOMA EXCISION     nose and rt thigh   BREAST BIOPSY      x 4   BREAST EXCISIONAL BIOPSY Right 1997   BREAST EXCISIONAL BIOPSY Left 1985   BREAST EXCISIONAL BIOPSY Left 1965   BREAST EXCISIONAL BIOPSY Right 1959   BREAST LUMPECTOMY Right 12/25/2015   BREAST LUMPECTOMY  02/27/2018   see report   BREAST LUMPECTOMY WITH RADIOACTIVE SEED LOCALIZATION Right 12/25/2015   Procedure: RIGHT BREAST LUMPECTOMY WITH RADIOACTIVE SEED LOCALIZATION;  Surgeon: Rolm Bookbinder, MD;  Location: [facility];  Service: General;  Laterality: Right;   CATARACT EXTRACTION     01-27-2011 left, 02-17-2011 right    CERVICAL LAMINECTOMY     1983   cervical microdiskectomy     1990 Dr Jovita Gamma   CESAREAN SECTION     COLONOSCOPY  03/21/2018   per Dr. Silverio Decamp, adenomatous polyps, no repeats due to age    EYE SURGERY     bil cataracts with  lens implant   FOOT ARTHRODESIS, MODIFIED MCBRIDE Right    LUMBAR LAMINECTOMY     1990 Dr Jovita Gamma   [facility]     POLYPECTOMY     REFRACTIVE SURGERY     01-29-2009 04-07-2009 Dr Foye Clock in [place]   spinal injection     07-30-2012, 09-24-2012   TENDON REPAIR Right    rt wrist   TOTAL HIP ARTHROPLASTY Right 05/13/2016   Procedure: RIGHT TOTAL HIP ARTHROPLASTY ANTERIOR APPROACH;  Surgeon: Rod Can, MD;  Location: [facility];  Service: Orthopedics;  Laterality: Right;  [place] RNFA   TOTAL HIP ARTHROPLASTY Left 11/11/2016   Procedure: LEFT TOTAL HIP ARTHROPLASTY ANTERIOR APPROACH;  Surgeon: Rod Can, MD;  Location: [facility];  Service: Orthopedics;  Laterality: Left;  Dr. requesting RNFA   VAGINAL BIRTH AFTER CESAREAN SECTION     x3   WRIST SURGERY Right    tendon repair  Family History  Problem Relation Age of Onset   Alzheimer's disease Father    Heart failure Father        d. 61   Stroke Mother 51   Cancer Sister        ovarian   Alzheimer's disease Paternal Aunt    Breast cancer Paternal Aunt    Colon cancer Brother        dx. 6s   Stroke Maternal Aunt        d. 33s   Heart disease Maternal Grandfather    Diabetes Maternal Grandfather    Stroke Paternal Grandmother 77   Heart attack Paternal Grandfather        d. 41s   Ovarian cancer Sister        dx. 36s; no genetic testing   Breast cancer Daughter    Leukemia Grandchild 21   Skin cancer Grandchild    Cervical cancer Cousin 96       maternal 1st cousin   Alzheimer's disease Paternal Aunt        (x4) paternal aunts   Heart attack Paternal Uncle 8   Colon polyps Neg Hx    Rectal cancer Neg Hx    Stomach cancer Neg Hx     Social History Social History   Tobacco Use   Smoking status: Former Smoker    Packs/day: 0.50    Years: 36.00    Pack years: 18.00    Types: Cigarettes    Quit date: 07/05/1994    Years since quitting: 25.2   Smokeless tobacco: Never  Used  Substance Use Topics   Alcohol use: Yes    Alcohol/week: 7.0 standard drinks    Types: 7 Glasses of wine per week    Comment: 08/02/2019- not currently- taking pain medications   Drug use: No    No Known Allergies  Current Outpatient Medications  Medication Sig Dispense Refill   acetaminophen (TYLENOL) 650 MG CR tablet Take 650-1,300 mg by mouth every 8 (eight) hours as needed for pain.     amLODipine (NORVASC) 5 MG tablet TAKE 1 TABLET BY MOUTH ONCE DAILY IN THE EVENING 90 tablet 0   anti-nausea (EMETROL) solution Take 10 mLs by mouth every 15 (fifteen) minutes as needed for nausea or vomiting.     aspirin 81 MG chewable tablet Chew 1 tablet (81 mg total) by mouth 2 (two) times daily. (Patient not taking: Reported on 08/22/2019) 60 tablet 1   Calcium Carb-Cholecalciferol (CALCIUM 600+D) 600-800 MG-UNIT TABS Take 1 tablet by mouth 2 (two) times daily.     chlorpheniramine (CHLOR-TRIMETON) 4 MG tablet Take 4 mg by mouth daily as needed for allergies.     cyclobenzaprine (FLEXERIL) 10 MG tablet Take 1 tablet by mouth three times daily as needed for muscle spasm 270 tablet 0   diphenhydrAMINE (BENADRYL) 12.5 MG chewable tablet Chew 12.5 mg by mouth 2 (two) times daily as needed (for allergic reaction (lip swelling)).     furosemide (LASIX) 40 MG tablet Take 1 tablet by mouth twice daily (Patient taking differently: Take 40 mg by mouth 2 (two) times daily. ) 180 tablet 0   gabapentin (NEURONTIN) 300 MG capsule Take 1 capsule (300 mg total) by mouth at bedtime. 30 capsule 3   guaifenesin (TUSSIN) 100 MG/5ML syrup Take 200 mg by mouth 3 (three) times daily as needed for cough.     HYDROcodone-acetaminophen (NORCO/VICODIN) 5-325 MG tablet Take 1-2 tablets by mouth every 4 (four) hours as needed (pain).  60 tablet 0   ibuprofen (ADVIL,MOTRIN) 200 MG tablet Take 400 mg by mouth every 8 (eight) hours as needed (for pain).      losartan (COZAAR) 50 MG tablet Take 1 tablet by mouth  once daily (Patient taking differently: Take 50 mg by mouth every evening. ) 90 tablet 0   Multiple Vitamin (MULTIVITAMIN WITH MINERALS) TABS tablet Take 1 tablet by mouth daily at 3 pm. One-A-Day 65+      Multiple Vitamins-Minerals (PRESERVISION AREDS 2 PO) Take 1 tablet by mouth 2 (two) times daily.     nabumetone (RELAFEN) 500 MG tablet Take 1 tablet (500 mg total) by mouth 2 (two) times daily. (Patient taking differently: Take 500 mg by mouth 2 (two) times daily as needed (pain.). ) 180 tablet 3   Omega 3 1200 MG CAPS Take 2,400 mg by mouth daily in the afternoon.      oxymetazoline (AFRIN) 0.05 % nasal spray Place 1 spray into both nostrils at bedtime.      pantoprazole (PROTONIX) 40 MG tablet Take 1 tablet by mouth once daily (Patient taking differently: Take 40 mg by mouth daily. ) 90 tablet 0   Polyethyl Glycol-Propyl Glycol (SYSTANE) 0.4-0.3 % SOLN Place 1 drop into both eyes 3 (three) times daily as needed (dry/irritated eyes.).     potassium chloride (K-DUR) 10 MEQ tablet TAKE 2 TABLETS BY MOUTH ONCE DAILY (Patient taking differently: Take 20 mEq by mouth daily in the afternoon. Mid afternoon) 180 tablet 3   simvastatin (ZOCOR) 20 MG tablet TAKE 1 TABLET BY MOUTH AT BEDTIME (Patient taking differently: Take 20 mg by mouth at bedtime. ) 90 tablet 0   timolol (TIMOPTIC) 0.5 % ophthalmic solution Place 1 drop into both eyes 2 (two) times daily.     traMADol (ULTRAM) 50 MG tablet Take 2 tablets (100 mg total) by mouth every 6 (six) hours as needed for moderate pain. (Patient not taking: Reported on 08/22/2019) 120 tablet 5   TRAVATAN Z 0.004 % SOLN ophthalmic solution Place 1 drop into both eyes at bedtime. 15 mL 0   vitamin B-12 (CYANOCOBALAMIN) 1000 MCG tablet Take 2,000 mcg by mouth daily at 3 pm.     No current facility-administered medications for this visit.    Review of Systems REVIEW OF SYSTEMS:   Review of Systems  Constitutional: Negative for appetite change, chills,  fatigue, fever and unexpected weight change.  HENT: Negative for mouth sores, nosebleeds, sore throat and trouble swallowing.   Eyes: Positive for progressive blindness due to macular degeneration.   Respiratory: Negative for cough, hemoptysis, shortness of breath and wheezing.   Cardiovascular: Negative for chest pain and leg swelling.  Gastrointestinal: Positive for intermittent abdominal pain and chronic frequent constipation. Negative for diarrhea, nausea and vomiting.  Genitourinary: Negative for bladder incontinence, difficulty urinating, dysuria, frequency and hematuria.   Musculoskeletal: Positive for back pain. Negative for gait problem, neck pain and neck stiffness.  Skin: Negative for itching and rash.  Neurological: Negative for dizziness, extremity weakness, gait problem, headaches, light-headedness and seizures.  Hematological: Negative for adenopathy. Does not bruise/bleed easily.  Psychiatric/Behavioral: Negative for confusion, depression and sleep disturbance. The patient is not nervous/anxious.     PHYSICAL EXAMINATION:  Blood pressure 129/61, pulse 96, temperature 98.2 F (36.8 C), temperature source Temporal, resp. rate 18, height 5' 4"  (1.626 m), weight 190 lb 4.8 oz (86.3 kg), SpO2 97 %.  ECOG PERFORMANCE STATUS: 1  Physical Exam  Constitutional: Oriented to person, place, and  time and well-developed, well-nourished, and in no distress.   HENT:  Head: Normocephalic and atraumatic.  Mouth/Throat: Oropharynx is clear and moist. No oropharyngeal exudate.  Eyes: Conjunctivae are normal. Right eye exhibits no discharge. Left eye exhibits no discharge. No scleral icterus.  Neck: Normal range of motion. Neck supple.  Cardiovascular: Normal rate, regular rhythm, normal heart sounds and intact distal pulses.   Pulmonary/Chest: Effort normal and breath sounds normal. No respiratory distress. No wheezes. No rales.  Abdominal: Soft. Bowel sounds are normal. Exhibits no  distension and no mass. There is no tenderness.  Musculoskeletal: Normal range of motion. Exhibits no edema.  Lymphadenopathy:    No cervical adenopathy.  Neurological: Alert and oriented to person, place, and time. Exhibits normal muscle tone. Gait normal. Coordination normal.  Skin: Scar in thoracic region from laminectomy. Skin is warm and dry. No rash noted. Not diaphoretic. No erythema. No pallor.  Psychiatric: Mood, memory and judgment normal.  Vitals reviewed.  LABORATORY DATA: Lab Results  Component Value Date   WBC 12.5 (H) 08/02/2019   HGB 13.9 08/02/2019   HCT 40.7 08/02/2019   MCV 98.3 08/02/2019   PLT 300 08/02/2019      Chemistry      Component Value Date/Time   NA 136 08/02/2019 1051   NA 141 12/10/2015 1237   K 3.7 08/02/2019 1051   K 3.9 12/10/2015 1237   CL 100 08/02/2019 1051   CO2 24 08/02/2019 1051   CO2 28 12/10/2015 1237   BUN 8 08/02/2019 1051   BUN 17.3 12/10/2015 1237   CREATININE 0.71 08/02/2019 1051   CREATININE 1.0 12/10/2015 1237      Component Value Date/Time   CALCIUM 9.2 08/02/2019 1051   CALCIUM 10.4 12/10/2015 1237   ALKPHOS 119 06/13/2019 1012   ALKPHOS 78 12/10/2015 1237   AST 66 (H) 06/13/2019 1012   AST 37 (H) 12/10/2015 1237   ALT 51 (H) 06/13/2019 1012   ALT 42 12/10/2015 1237   BILITOT 1.0 06/13/2019 1012   BILITOT 0.43 12/10/2015 1237       RADIOGRAPHIC STUDIES: CT CHEST W CONTRAST  Result Date: 08/29/2019 CLINICAL DATA:  Metastatic adenocarcinoma on right T8 lytic vertebral osseous biopsy 07/27/2019 compatible with stage IV adenocarcinoma of unknown primary. History of breast cancer (DCIS). EXAM: CT CHEST WITH CONTRAST TECHNIQUE: Multidetector CT imaging of the chest was performed during intravenous contrast administration. CONTRAST:  34m OMNIPAQUE IOHEXOL 300 MG/ML  SOLN COMPARISON:  06/13/2019 chest radiograph. FINDINGS: Cardiovascular: Top-normal heart size. No significant pericardial effusion/thickening. Left  anterior descending coronary atherosclerosis. Atherosclerotic nonaneurysmal thoracic aorta. Top-normal caliber main pulmonary artery (3.1 cm diameter). No central pulmonary emboli. Mediastinum/Nodes: Subcentimeter hypodense anterior upper right thyroid nodule, requiring no follow-up. Unremarkable esophagus. No pathologically enlarged axillary, mediastinal or hilar lymph nodes. Lungs/Pleura: No pneumothorax. No pleural effusion. Lobular 3.5 x 2.7 cm solid left lung mass centered in anterior left lower lobe (series 4/image 69) with apparent direct extension into the lingula. No acute consolidative airspace disease or additional significant pulmonary nodules. Minimal patchy subpleural reticulation and ground-glass opacity in the anterior mid to upper right lung, compatible with minimal radiation fibrosis. Upper abdomen: Small hiatal hernia. Musculoskeletal: Expansile lytic destructive 5.2 x 3.7 cm right T8 vertebral lesion extending into the right posterior elements (series 4/image 85). No additional osseous lesions in the chest. Marked thoracic spondylosis. Status post bilateral posterior spinal fusion T6-T8. IMPRESSION: 1. Lobular 3.5 cm solid left lung mass centered in the anterior left lower lobe with  apparent direct extension into the lingula, compatible with primary bronchogenic carcinoma. 2. Expansile lytic 5.2 cm right T8 vertebral lesion extending into the right posterior elements, compatible with osseous metastasis. 3. No additional potential sites of metastatic disease in the chest. Consider PET-CT for further staging evaluation. 4. Coronary atherosclerosis. 5. Small hiatal hernia. 6. Aortic Atherosclerosis (ICD10-I70.0). Electronically Signed   By: Ilona Sorrel M.D.   On: 08/29/2019 10:30   CT THORACIC SPINE W CONTRAST  Result Date: 08/27/2019 CLINICAL DATA:  SRS treatment planning. FLUOROSCOPY TIME:  Fluoroscopy Time: 52 seconds Radiation Exposure Index: 181.58 microGray*m^2 PROCEDURE: LUMBAR PUNCTURE  FOR THORACIC MYELOGRAM After thorough discussion of risks and benefits of the procedure including bleeding, infection, injury to nerves, blood vessels, adjacent structures as well as headache and CSF leak, written and oral informed consent was obtained. Consent was obtained by Dr. Logan Bores. Patient was positioned prone on the fluoroscopy table. Local anesthesia was provided with 1% lidocaine without epinephrine after prepped and draped in the usual sterile fashion. Puncture was performed at L2-3 using a 3 1/2 inch 22-gauge spinal needle via a right interlaminar approach. Using a single pass through the dura, the needle was placed within the thecal sac, with return of clear CSF. 10 mL of Isovue M-300 was injected into the thecal sac, with normal opacification of the nerve roots and cauda equina consistent with free flow within the subarachnoid space. The patient was then moved to the trendelenburg position and contrast flowed into the Thoracic spine region. I personally performed the lumbar puncture and administered the intrathecal contrast. I also personally supervised acquisition of the myelogram images. TECHNIQUE: Contiguous axial images were obtained through the Thoracic spine after the intrathecal infusion of infusion. Coronal and sagittal reconstructions were obtained of the axial image sets. COMPARISON:  Thoracic spine MRI 07/19/2019 FINDINGS: THORACIC MYELOGRAM FINDINGS: Sequelae of T7-T9 posterior fusion are identified without evidence of compressive spinal stenosis. Scattered small ventral extradural defects are noted elsewhere in the thoracic spine with detailed assessment of degenerative changes deferred to CT. CT THORACIC MYELOGRAM FINDINGS: There is 3 mm anterolisthesis of T2 on T3 and trace anterolisthesis of T4 on T5. No acute fracture is identified. Aortic atherosclerosis, coronary atherosclerosis, and a small sliding hiatal hernia are noted. Sequelae of interval T7-T9 posterior fusion are  identified including right-sided laminectomy and facetectomies at T8. There are bilateral pedicle screws at T7 and T9 and a left-sided screw at T8. The screws appear well-positioned without evidence of loosening. Bone graft material is noted along the posterior elements on the left. A destructive lesion is again seen in the right aspect of the T8 vertebral body with involvement of the superior and inferior endplates as well as posterior and lateral vertebral body cortex. Soft tissue lateral to the T8 vertebral body on the right and in the right ventrolateral and dorsolateral epidural space may reflect a combination of extraosseous tumor and postoperative changes. This results in mild spinal stenosis at T7-8 with mild flattening of the right ventral aspect of the spinal cord. C6-7: Interbody and bilateral facet ankylosis. No stenosis. C7-T1: Mild to moderate disc space narrowing. Mild disc bulging, endplate spurring, and mild-to-moderate facet arthrosis result in mild left neural foraminal stenosis without spinal stenosis. T1-2: Mild-to-moderate disc space narrowing. Disc bulging, endplate spurring, and moderate right and mild left facet arthrosis without significant stenosis. T2-3: Mild-to-moderate disc space narrowing with vacuum disc. Anterolisthesis with bulging uncovered disc, a moderate-sized central pseudo disc extrusion, and moderate facet arthrosis result in mild  spinal stenosis with mild cord flattening. No significant neural foraminal stenosis. T3-4: Severe disc space narrowing. Endplate spurring, disc space height loss, and facet arthrosis without significant stenosis. Bilateral facet ankylosis. T4-5: Mild-to-moderate disc space narrowing and vacuum disc. Mild disc bulging and severe right and mild left facet arthrosis without stenosis. T5-6: Mild-to-moderate disc space narrowing with vacuum disc. Moderate right and mild left facet arthrosis and a tiny central disc protrusion without stenosis. T6-7:  Moderate facet arthrosis without disc herniation or stenosis. T7-8: Posterior decompression and fusion with extraosseous tumor and mild spinal stenosis as above. T8-9: Posterior fusion. Effacement of the right neural foramen by tumor. No spinal stenosis. T9-10: Mild right and moderate to severe left facet arthrosis without disc herniation or stenosis. T10-11: Tiny calcified central disc protrusion and mild-to-moderate facet arthrosis without stenosis. T11-12: Mild disc bulging and moderate right and mild left facet arthrosis without stenosis. T12-L1: Shallow, broad, partially calcified central disc protrusion without stenosis. IMPRESSION: 1. Known destructive tumor involving the right aspect of the T8 vertebral body status post interval T7-T9 posterior decompression fusion. 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 2. Multilevel disc and advanced facet degeneration in the thoracic spine with grade 1 anterolisthesis and mild spinal stenosis at T2-3. Aortic Atherosclerosis (ICD10-I70.0). Electronically Signed   By: Logan Bores M.D.   On: 08/27/2019 15:38   NM PET Image Initial (PI) Skull Base To Thigh  Result Date: 09/05/2019 CLINICAL DATA:  Initial treatment strategy for breast cancer. EXAM: NUCLEAR MEDICINE PET SKULL BASE TO THIGH TECHNIQUE: 9.3 mCi F-18 FDG was injected intravenously. Full-ring PET imaging was performed from the skull base to thigh after the radiotracer. CT data was obtained and used for attenuation correction and anatomic localization. Fasting blood glucose: 138 mg/dl COMPARISON:  None FINDINGS: Mediastinal blood pool activity: SUV max 3.65 Liver activity: SUV max NA NECK: No hypermetabolic lymph nodes in the neck. Incidental CT findings: none CHEST: Mild FDG uptake associated with left hilar lymph node. 3.2 cm left mid lung mass which crosses the oblique fissure is again noted. SUV max associated with this nodule is equal to 14.56. Incidental CT findings: Aortic atherosclerosis. Lad  coronary artery calcifications. ABDOMEN/PELVIS: No abnormal hypermetabolic activity within the liver, pancreas, adrenal glands, or spleen. No hypermetabolic lymph nodes in the abdomen or pelvis. Incidental CT findings: Aortic atherosclerosis. SKELETON: Expansile lytic lesion involving the T8 vertebra has an SUV max of 15.52. Within the right side of the T9 vertebral body there is a lucent lesion which has an SUV max of 14.18. A second area of increased uptake localizes to the posterior elements of T9 within SUV max of 9.66. Lytic lesion involving the left iliac bone measures 1.6 cm and has an SUV max of 15.39. Incidental CT findings: none IMPRESSION: 1. Intense FDG uptake is associated with the lytic bone metastasis involving T8 and T9 vertebra and the left iliac bone. 2. Left mid lung mass measures 3.2 cm and extends across the oblique fissure. This is intensely FDG avid compatible with pulmonary metastasis or primary pulmonary neoplasm. 3. Mild FDG uptake associated with left hilar lymph node. Cannot rule out nodal metastasis. Electronically Signed   By: Kerby Moors M.D.   On: 09/05/2019 13:56   DG MYELOGRAPHY LUMBAR INJ THORACIC  Result Date: 08/27/2019 CLINICAL DATA:  SRS treatment planning. FLUOROSCOPY TIME:  Fluoroscopy Time: 52 seconds Radiation Exposure Index: 181.58 microGray*m^2 PROCEDURE: LUMBAR PUNCTURE FOR THORACIC MYELOGRAM After thorough discussion of risks and benefits of the procedure including  bleeding, infection, injury to nerves, blood vessels, adjacent structures as well as headache and CSF leak, written and oral informed consent was obtained. Consent was obtained by Dr. Logan Bores. Patient was positioned prone on the fluoroscopy table. Local anesthesia was provided with 1% lidocaine without epinephrine after prepped and draped in the usual sterile fashion. Puncture was performed at L2-3 using a 3 1/2 inch 22-gauge spinal needle via a right interlaminar approach. Using a single pass  through the dura, the needle was placed within the thecal sac, with return of clear CSF. 10 mL of Isovue M-300 was injected into the thecal sac, with normal opacification of the nerve roots and cauda equina consistent with free flow within the subarachnoid space. The patient was then moved to the trendelenburg position and contrast flowed into the Thoracic spine region. I personally performed the lumbar puncture and administered the intrathecal contrast. I also personally supervised acquisition of the myelogram images. TECHNIQUE: Contiguous axial images were obtained through the Thoracic spine after the intrathecal infusion of infusion. Coronal and sagittal reconstructions were obtained of the axial image sets. COMPARISON:  Thoracic spine MRI 07/19/2019 FINDINGS: THORACIC MYELOGRAM FINDINGS: Sequelae of T7-T9 posterior fusion are identified without evidence of compressive spinal stenosis. Scattered small ventral extradural defects are noted elsewhere in the thoracic spine with detailed assessment of degenerative changes deferred to CT. CT THORACIC MYELOGRAM FINDINGS: There is 3 mm anterolisthesis of T2 on T3 and trace anterolisthesis of T4 on T5. No acute fracture is identified. Aortic atherosclerosis, coronary atherosclerosis, and a small sliding hiatal hernia are noted. Sequelae of interval T7-T9 posterior fusion are identified including right-sided laminectomy and facetectomies at T8. There are bilateral pedicle screws at T7 and T9 and a left-sided screw at T8. The screws appear well-positioned without evidence of loosening. Bone graft material is noted along the posterior elements on the left. A destructive lesion is again seen in the right aspect of the T8 vertebral body with involvement of the superior and inferior endplates as well as posterior and lateral vertebral body cortex. Soft tissue lateral to the T8 vertebral body on the right and in the right ventrolateral and dorsolateral epidural space may reflect  a combination of extraosseous tumor and postoperative changes. This results in mild spinal stenosis at T7-8 with mild flattening of the right ventral aspect of the spinal cord. C6-7: Interbody and bilateral facet ankylosis. No stenosis. C7-T1: Mild to moderate disc space narrowing. Mild disc bulging, endplate spurring, and mild-to-moderate facet arthrosis result in mild left neural foraminal stenosis without spinal stenosis. T1-2: Mild-to-moderate disc space narrowing. Disc bulging, endplate spurring, and moderate right and mild left facet arthrosis without significant stenosis. T2-3: Mild-to-moderate disc space narrowing with vacuum disc. Anterolisthesis with bulging uncovered disc, a moderate-sized central pseudo disc extrusion, and moderate facet arthrosis result in mild spinal stenosis with mild cord flattening. No significant neural foraminal stenosis. T3-4: Severe disc space narrowing. Endplate spurring, disc space height loss, and facet arthrosis without significant stenosis. Bilateral facet ankylosis. T4-5: Mild-to-moderate disc space narrowing and vacuum disc. Mild disc bulging and severe right and mild left facet arthrosis without stenosis. T5-6: Mild-to-moderate disc space narrowing with vacuum disc. Moderate right and mild left facet arthrosis and a tiny central disc protrusion without stenosis. T6-7: Moderate facet arthrosis without disc herniation or stenosis. T7-8: Posterior decompression and fusion with extraosseous tumor and mild spinal stenosis as above. T8-9: Posterior fusion. Effacement of the right neural foramen by tumor. No spinal stenosis. T9-10: Mild right and moderate to  severe left facet arthrosis without disc herniation or stenosis. T10-11: Tiny calcified central disc protrusion and mild-to-moderate facet arthrosis without stenosis. T11-12: Mild disc bulging and moderate right and mild left facet arthrosis without stenosis. T12-L1: Shallow, broad, partially calcified central disc  protrusion without stenosis. IMPRESSION: 1. Known destructive tumor involving the right aspect of the T8 vertebral body status post interval T7-T9 posterior decompression fusion. 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 2. Multilevel disc and advanced facet degeneration in the thoracic spine with grade 1 anterolisthesis and mild spinal stenosis at T2-3. Aortic Atherosclerosis (ICD10-I70.0). Electronically Signed   By: Logan Bores M.D.   On: 08/27/2019 15:38   MM DIAG BREAST TOMO BILATERAL  Result Date: 09/07/2019 CLINICAL DATA:  79 year old female with history of right breast cancer status post lumpectomy 12/25/2015 status post lumpectomy. The patient recently had a biopsy of a spine lesion the demonstrating metastatic adenocarcinoma of unknown primary. EXAM: DIGITAL DIAGNOSTIC BILATERAL MAMMOGRAM WITH CAD AND TOMO COMPARISON:  Previous exam(s). ACR Breast Density Category c: The breast tissue is heterogeneously dense, which may obscure small masses. FINDINGS: The right breast lumpectomy site is stable. No suspicious calcifications, masses or areas of distortion are seen in the bilateral breasts. Mammographic images were processed with CAD. IMPRESSION: Stable right breast lumpectomy site. No mammographic evidence of malignancy in the bilateral breasts. RECOMMENDATION: Diagnostic mammogram is suggested in 1 year. (Code:[id number]) I have discussed the findings and recommendations with the patient. If applicable, a reminder letter will be sent to the patient regarding the next appointment. BI-RADS CATEGORY  2: Benign. Electronically Signed   By: Ammie Ferrier M.D.   On: 09/07/2019 14:26    ASSESSMENT: This is a very pleasant 79 year old Caucasian female  diagnosed with  1) stage IV non-small cell lung cancer, adenocarcinoma.  She presented with a mid left lung mass that crosses the oblique fissure, bone metastases involving T8 and T9 vertebra and the left iliac bone.  Her PDL 1 expression is 20%  and she has no actionable mutations.  She was diagnosed in February 2021.  Status post thoracic laminectomy under the care of Dr. Sherwood Gambler in August 06, 2019. 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    PLAN: The patient was seen with Dr. Julien Nordmann today. Dr. Julien Nordmann had a lengthly discussion with the patient today about her current condition and treatment options.   The patient was given the option of a referral to hospice/palliative vs. Palliative treatment with systemic chemotherapy with carboplatin for an AUC of 5, Alimta 500 mg/m, and Keytruda 200 mg IV every 3 weeks. Dr. Julien Nordmann discussed prognosis with and without treatment. The patient is not interested in chemotherapy at this time and would like to continue to think about her options. She states she is no longer able to do the things she enjoys such as painting and sewing due to her progressive blindness and is weighing quality of life vs quantity. Dr. Julien Nordmann also discussed that we can refer her to radiation oncology for consideration of radiation to the primary lung mass. Discussed that she has stage IV disease and this would not be curative but may prolong her life. The patient is interested in having a consultation with Dr. Lisbeth Renshaw and Shona Simpson to discuss this option.   We will not schedule any follow up appointments at this time and we will see the patient on an as needed basis.   Encouraged her to  call the clinic if she decides to pursue chemotherapy. I  gave her educational handouts on the recommended chemotherapy regimen if she changes her mind.    If the patient decides to pursue hospice, Dr. Julien Nordmann instructed her to reach out to her PCP to obtain hospice referral.   The patient voices understanding of current disease status and treatment options and is in agreement with the current care plan.  All questions were answered. The patient knows to call the  clinic with any problems, questions or concerns. We can certainly see the patient much sooner if necessary.  Thank you so much for allowing me to participate in the care of Connie Lang. I will continue to follow up the patient with you and assist in her care.  The total time spent in the appointment was 80 minutes.  Disclaimer: This note was dictated with voice recognition software. Similar sounding words can inadvertently be transcribed and may not be corrected upon review.   Aniayah Alaniz L Maevyn Riordan September 13, 2019, 7:56 AM  ADDENDUM: Hematology/Oncology Attending: I had a face-to-face encounter with the patient today.  I recommended her care plan.  This is a very pleasant 79 years old white female with history of DCIS status post lumpectomy as well as radiotherapy in 2017. The patient has been complaining of abdominal pain with radiation to the back.  She was evaluated with CT renal to rule out kidney stones but this was unremarkable.  She was found to have a lesion at the T8 vertebral body.  The patient was seen by Dr. Sherwood Gambler and she underwent laminectomy with biopsy of the T8 vertebral lesion that was consistent with metastatic adenocarcinoma.  She also underwent SRS to the T8 vertebral lesion after resection.  Molecular profile with [facility] indicated 98% probability for lung cancer.  The patient has no actionable mutations but has PD-L1 expression of 20%.  Imaging studies including PET scan showed intense FDG uptake associated with the lytic bone metastasis involving T8 and T9 vertebrae as well as the left iliac bone.  There was also left mid lung mass measuring 3.2 cm with extending across the oblique fissure this was intensely FDG avid compatible with malignancy with involvement of left hilar lymphadenopathy. The patient was referred to me today for evaluation and recommendation regarding treatment of her condition. I had a lengthy discussion with the patient and her husband today  about her current disease stage, prognosis and treatment options.  The patient understands that she has stage IV non-small cell lung cancer, adenocarcinoma which is incurable condition and all the treatment will be of palliative nature. The patient was given the option of palliative care and hospice referral versus consideration of palliative systemic chemotherapy with carboplatin, Alimta and Keytruda every 3 weeks.  I discussed with the patient the adverse effect of the chemotherapy including but not limited to mild alopecia, myelosuppression, nausea and vomiting, peripheral neuropathy, liver or renal dysfunction as well as immunotherapy adverse effects.  The patient also legally blind and she indicated that she is not interested in any systemic therapy at this point.  She will reach out to her primary care physician Dr. Alysia Penna for consideration of palliative care and hospice referral.  I also discussed with the patient the option of just palliative radiotherapy to the left lower lobe lung mass as an alternative option for systemic treatment.  She is interested in this option and she will be referred back to Dr. Lisbeth Renshaw for discussion of this option. I indicated  to the patient that I will be happy to reconsider her for systemic treatment if she changes her mind.  For now we will see her on an as-needed basis. The patient was advised to call if she has any concerning issues in the future.  Disclaimer: This note was dictated with voice recognition software. Similar sounding words can inadvertently be transcribed and may be missed upon review. Eilleen Kempf, MD 09/13/19

## 2019-09-13 NOTE — Patient Instructions (Addendum)
Summary:  -There are two main categories of lung cancer, they are named based on the size of the cancer cell. One is called Non-Small cell lung cancer. The other type is Small Cell Lung Cancer -The sample (biopsy) that they took of your tumor was consistent with a subtype of Non-small cell lung cancer called Adenocarcinoma. This is the most common type of lung cancer.  -We covered a lot of important information at your appointment today regarding what the treatment plan is moving forward. Here are the the main points that were discussed at your office visit with [place] today:  -The treatment that Dr. Julien Nordmann recommended would consists of two chemotherapy drugs, called Carboplatin and Alimta (also called Pemetrexed) and one immunotherapy drug called Keytruda (pembrolizumab).  -Call us if you would be interested in treatment. We would arrange for a  Chemotherapy Education Class. This involves having you sit down with one of our nurse educators. She will discuss with your one-on-one more details about your treatment as well as general information about resources here at the cancer center.  -Your treatment will be given once every 3 weeks. We will check your labs once a week for the first ~5 treatments just to make sure that important components of your blood are in an acceptable range -We will get a CT scan after 3 treatments to check on the progress of treatment.   Referrals or Imaging: -Radiation oncology for radiation of the spot in the lung.   Follow up:  -If you need to reach [place] at any time, the main office number to the cancer center is [phone], when you call, ask to speak to either Cassie's or Dr. Worthy Flank nurse.

## 2019-09-13 NOTE — Progress Notes (Signed)
The proposed treatment discussed in cancer conference 09/13/19 is for discussion purpose only and is not a binding recommendation.  The patient was not physically examined nor present for their treatment options.  Therefore, final treatment plans cannot be decided.  

## 2019-09-13 NOTE — Telephone Encounter (Signed)
Oncology Nurse Navigator Documentation  Oncology Nurse Navigator Flowsheets 09/13/2019  Abnormal Finding Date -  Confirmed Diagnosis Date -  Expected Surgery Date -  Navigator Location [facility]  Referral Date to RadOnc/MedOnc -  Navigator Encounter Type Telephone/per Ms. Lorne Skeens request, I called her to reminder of her appt today with Cassie. She verbalized understanding of appt time and place.   Telephone Outgoing Call  Treatment Initiated Date -  Patient Visit Type -  Treatment Phase Pre-Tx/Tx Discussion  Barriers/Navigation Needs Education  Education Other  Interventions Education  Acuity Level 2-Minimal Needs (1-2 Barriers Identified)  Coordination of Care -  Education Method Verbal  Time Spent with Patient 15

## 2019-09-14 LAB — SURGICAL PATHOLOGY

## 2019-09-19 ENCOUNTER — Ambulatory Visit: Payer: Medicare Other | Admitting: Family Medicine

## 2019-09-19 ENCOUNTER — Telehealth: Payer: Self-pay | Admitting: *Deleted

## 2019-09-19 NOTE — Telephone Encounter (Signed)
Oncology Nurse Navigator Documentation  Oncology Nurse Navigator Flowsheets 09/19/2019  Abnormal Finding Date -  Confirmed Diagnosis Date -  Expected Surgery Date -  Navigator Location [facility]  Referral Date to RadOnc/MedOnc -  Navigator Encounter Type Telephone/I called Ms. Willy to check on how she was doing.  She angry I called.  I listened to her. She states her stomach hurt and other pains.  I asked if she called her PCP about issues and concerns. She states no. She states she will see Dr. Lisbeth Renshaw tomorrow.    Telephone Outgoing Call  Treatment Initiated Date -  Patient Visit Type -  Treatment Phase Pre-Tx/Tx Discussion  Barriers/Navigation Needs -  Education -  Interventions -  Acuity Level 2-Minimal Needs (1-2 Barriers Identified)  Coordination of Care -  Education Method -  Time Spent with Patient 15

## 2019-09-20 ENCOUNTER — Ambulatory Visit: Admission: RE | Admit: 2019-09-20 | Payer: Medicare Other | Source: Ambulatory Visit | Admitting: Radiation Oncology

## 2019-09-20 ENCOUNTER — Ambulatory Visit
Admission: RE | Admit: 2019-09-20 | Discharge: 2019-09-20 | Disposition: A | Discharge status: Home / Self Care | Payer: Medicare Other | Source: Ambulatory Visit | Attending: Radiation Oncology | Admitting: Radiation Oncology

## 2019-09-20 ENCOUNTER — Other Ambulatory Visit: Payer: Self-pay

## 2019-09-20 ENCOUNTER — Other Ambulatory Visit: Payer: Self-pay | Admitting: Radiation Therapy

## 2019-09-20 VITALS — BP 153/81 | HR 97 | Temp 98.2°F | Resp 20 | Wt 192.4 lb

## 2019-09-20 DIAGNOSIS — Z86000 Personal history of in-situ neoplasm of breast: Secondary | ICD-10-CM | POA: Diagnosis not present

## 2019-09-20 DIAGNOSIS — C7951 Secondary malignant neoplasm of bone: Secondary | ICD-10-CM | POA: Diagnosis not present

## 2019-09-20 DIAGNOSIS — Z85118 Personal history of other malignant neoplasm of bronchus and lung: Secondary | ICD-10-CM | POA: Diagnosis not present

## 2019-09-20 DIAGNOSIS — C3492 Malignant neoplasm of unspecified part of left bronchus or lung: Secondary | ICD-10-CM

## 2019-09-20 DIAGNOSIS — G893 Neoplasm related pain (acute) (chronic): Secondary | ICD-10-CM | POA: Diagnosis not present

## 2019-09-20 NOTE — Progress Notes (Signed)
Patient here in office for appointment was in system for in person appointment vitals obtained and weight. Paged Doctor Lisbeth Renshaw and Shona Simpson for patient visit.

## 2019-09-22 NOTE — Progress Notes (Signed)
Radiation Oncology         ([phone]) [phone] ________________________________  Name: Connie Lang        MRN: [id number]  Date of Service: 09/20/2019 DOB: 01-06-1941  CC:Fry, Ishmael Holter, MD  Heilingoetter, Cassandr*     REFERRING PHYSICIAN: Heilingoetter, Cassandr*   DIAGNOSIS: The primary encounter diagnosis was Spine metastasis ([place]). A diagnosis of Adenocarcinoma of left lung, stage 4 (HCC) was also pertinent to this visit.   HISTORY OF PRESENT ILLNESS: Connie Lang is a 79 y.o. female seen at with a newly diagnosed metastatic adenocarcinoma of the lung involving the T8 spine.  The patient has a previous history of a basal cell cancer of the skin, as well as a history of ER/PR negative ductal carcinoma in situ of the right breast that was treated with lumpectomy and adjuvant radiotherapy.  The patient had normal mammography in June 2020.  She developed abdominal pain and posterior chest pain and as pain continued and she sought care with Dr. Sherwood Gambler who has taken care of her for many years.  She described her symptoms, and further work-up of the thoracic spine began.  In fact the patient had had a CT renal protocol study on 06/13/2019 and in retrospect after Dr. Cleon Gustin view of the films, there was a suspicious lytic lesion at T8 vertebral body on the right side which could account for the patient's description of pain.  She underwent a CT-guided biopsy on 07/27/2019 which revealed a metastatic adenocarcinoma.  She underwent Thoracic laminectomy at T8 and resection of epidural disease with instrumentation on 08/06/2019.  Final pathology that accompanied the procedure includes metastatic adenocarcinoma in the thoracic extradural resection x2.  Given the findings the plan is to consider postoperative SRS to any residual tumor. She received this treatment on 08/31/19. Upon further work up it was felt that this disease was a primary lung cancer after a PET scan on 09/05/19 revealed hypermetabolic  change in a 3.2 cm mass in the left mid lung crossing the oblique fissure as well as a left iliac wing metastasis and some persistent hypermetabolism in the thoracic spine . She met with Dr. Julien Nordmann and he offered hospice versus palliative chemotherapy. She is not interested in any systemic therapy. She is seen today to determine if there is a role for additional radiotherapy.  PREVIOUS RADIATION THERAPY: Yes   08/31/19 SRS Treatment: The T8 vertebral body was treated to 18 Gy in a single fraction.   01/29/2016 through 02/25/2016: The patient initially received a dose of 42.5 Gy in 17 fractions to the breast using whole-breast tangent fields. This was delivered using a 3-D conformal technique. The patient then received a boost to the seroma. This delivered an additional 7.5 Gy in 3 fractions using an en face electron field due to the depth of the seroma. The total dose was 50 Gy.    PAST MEDICAL HISTORY:  Past Medical History:  Diagnosis Date  . Allergy    occasionally has hayfever  . Anxiety   . Breast cancer ([place]) 2017  . Cancer ([place]) 1987   cancerous cells in vaginal wall seemed to be metastatic from the gut but no primary was ever found   . Cataract   . Chicken pox   . Depression   . DJD (degenerative joint disease)   . First degree AV block   . GERD (gastroesophageal reflux disease)   . Glaucoma    glaucoma and cataracts sees Dr Satira Sark   . Hyperlipemia   .  Hypertension    pt denies currently. " Prednisone made it high."  . LBBB (left bundle branch block)   . Lumbar disc disease    sees Dr. Sherwood Gambler   . Macular degeneration   . Mobitz type 1 second degree atrioventricular block   . Osteoarthritis   . Osteopenia   . Personal history of radiation therapy   . PMR (polymyalgia rheumatica) (HCC)    sees Dr. Leigh Aurora   . Radiation    02-25-16 last radiation for breast cancer  . Transfusion history    blood        PAST SURGICAL HISTORY: Past Surgical History:    Procedure Laterality Date  . ABDOMINAL HYSTERECTOMY     TAH BSO 1985  . APPENDECTOMY    . BASAL CELL CARCINOMA EXCISION     nose and rt thigh  . BREAST BIOPSY      x 4  . BREAST EXCISIONAL BIOPSY Right 1997  . BREAST EXCISIONAL BIOPSY Left 1985  . BREAST EXCISIONAL BIOPSY Left 1965  . BREAST EXCISIONAL BIOPSY Right 1959  . BREAST LUMPECTOMY Right 12/25/2015  . BREAST LUMPECTOMY  02/27/2018   see report  . BREAST LUMPECTOMY WITH RADIOACTIVE SEED LOCALIZATION Right 12/25/2015   Procedure: RIGHT BREAST LUMPECTOMY WITH RADIOACTIVE SEED LOCALIZATION;  Surgeon: Rolm Bookbinder, MD;  Location: [facility];  Service: General;  Laterality: Right;  . CATARACT EXTRACTION     01-27-2011 left, 02-17-2011 right   . CERVICAL LAMINECTOMY     1983  . cervical microdiskectomy     1990 Dr Jovita Gamma  . CESAREAN SECTION    . COLONOSCOPY  03/21/2018   per Dr. Silverio Decamp, adenomatous polyps, no repeats due to age   . EYE SURGERY     bil cataracts with lens implant  . FOOT ARTHRODESIS, MODIFIED MCBRIDE Right   . LUMBAR LAMINECTOMY     1990 Dr Jovita Gamma  . MYELOGRAM    . POLYPECTOMY    . REFRACTIVE SURGERY     01-29-2009 04-07-2009 Dr Foye Clock in [place]  . spinal injection     07-30-2012, 09-24-2012  . TENDON REPAIR Right    rt wrist  . TOTAL HIP ARTHROPLASTY Right 05/13/2016   Procedure: RIGHT TOTAL HIP ARTHROPLASTY ANTERIOR APPROACH;  Surgeon: Rod Can, MD;  Location: [facility];  Service: Orthopedics;  Laterality: Right;  Nedds RNFA  . TOTAL HIP ARTHROPLASTY Left 11/11/2016   Procedure: LEFT TOTAL HIP ARTHROPLASTY ANTERIOR APPROACH;  Surgeon: Rod Can, MD;  Location: [facility];  Service: Orthopedics;  Laterality: Left;  Dr. requesting RNFA  . [place]     x3  . WRIST SURGERY Right    tendon repair     FAMILY HISTORY:  Family History  Problem Relation Age of Onset  . Alzheimer's disease Father   . Heart failure Father        d.  76  . Stroke Mother 64  . Cancer Sister        ovarian  . Alzheimer's disease Paternal Aunt   . Breast cancer Paternal Aunt   . Colon cancer Brother        dx. 66s  . Stroke Maternal Aunt        d. 89s  . Heart disease Maternal Grandfather   . Diabetes Maternal Grandfather   . Stroke Paternal Grandmother 73  . Heart attack Paternal Grandfather        d. 45s  . Ovarian cancer Sister  dx. 20s; no genetic testing  . Breast cancer Daughter   . Leukemia Grandchild 21  . Skin cancer Grandchild   . Cervical cancer Cousin 26       maternal 1st cousin  . Alzheimer's disease Paternal Aunt        (x4) paternal aunts  . Heart attack Paternal Uncle 67  . Colon polyps Neg Hx   . Rectal cancer Neg Hx   . Stomach cancer Neg Hx      SOCIAL HISTORY:  reports that she quit smoking about 25 years ago. Her smoking use included cigarettes. She has a 18.00 pack-year smoking history. She has never used smokeless tobacco. She reports current alcohol use of about 7.0 standard drinks of alcohol per week. She reports that she does not use drugs.The patient is married and accompanied by her husband.   ALLERGIES: Patient has no known allergies.   MEDICATIONS:  Current Outpatient Medications  Medication Sig Dispense Refill  . acetaminophen (TYLENOL) 650 MG CR tablet Take 650-1,300 mg by mouth every 8 (eight) hours as needed for pain.    Marland Kitchen amLODipine (NORVASC) 5 MG tablet TAKE 1 TABLET BY MOUTH ONCE DAILY IN THE EVENING 90 tablet 0  . anti-nausea (EMETROL) solution Take 10 mLs by mouth every 15 (fifteen) minutes as needed for nausea or vomiting.    Marland Kitchen aspirin 81 MG chewable tablet Chew 1 tablet (81 mg total) by mouth 2 (two) times daily. 60 tablet 1  . Calcium Carb-Cholecalciferol (CALCIUM 600+D) 600-800 MG-UNIT TABS Take 1 tablet by mouth 2 (two) times daily.    . chlorpheniramine (CHLOR-TRIMETON) 4 MG tablet Take 4 mg by mouth daily as needed for allergies.    . cyclobenzaprine (FLEXERIL) 10 MG  tablet Take 1 tablet by mouth three times daily as needed for muscle spasm 270 tablet 0  . diphenhydrAMINE (BENADRYL) 12.5 MG chewable tablet Chew 12.5 mg by mouth 2 (two) times daily as needed (for allergic reaction (lip swelling)).    . furosemide (LASIX) 40 MG tablet Take 1 tablet by mouth twice daily (Patient taking differently: Take 40 mg by mouth 2 (two) times daily. ) 180 tablet 0  . gabapentin (NEURONTIN) 300 MG capsule Take 1 capsule (300 mg total) by mouth at bedtime. 30 capsule 3  . guaifenesin (TUSSIN) 100 MG/5ML syrup Take 200 mg by mouth 3 (three) times daily as needed for cough.    Marland Kitchen HYDROcodone-acetaminophen (NORCO/VICODIN) 5-325 MG tablet Take 1-2 tablets by mouth every 4 (four) hours as needed (pain). 60 tablet 0  . ibuprofen (ADVIL,MOTRIN) 200 MG tablet Take 400 mg by mouth every 8 (eight) hours as needed (for pain).     Marland Kitchen losartan (COZAAR) 50 MG tablet Take 1 tablet by mouth once daily (Patient taking differently: Take 50 mg by mouth every evening. ) 90 tablet 0  . Multiple Vitamin (MULTIVITAMIN WITH MINERALS) TABS tablet Take 1 tablet by mouth daily at 3 pm. One-A-Day 65+     . Multiple Vitamins-Minerals (PRESERVISION AREDS 2 PO) Take 1 tablet by mouth 2 (two) times daily.    . nabumetone (RELAFEN) 500 MG tablet Take 1 tablet (500 mg total) by mouth 2 (two) times daily. 180 tablet 3  . Omega 3 1200 MG CAPS Take 2,400 mg by mouth daily in the afternoon.     Marland Kitchen oxymetazoline (AFRIN) 0.05 % nasal spray Place 1 spray into both nostrils at bedtime.     . pantoprazole (PROTONIX) 40 MG tablet Take 1 tablet by mouth  once daily (Patient taking differently: Take 40 mg by mouth daily. ) 90 tablet 0  . Polyethyl Glycol-Propyl Glycol (SYSTANE) 0.4-0.3 % SOLN Place 1 drop into both eyes 3 (three) times daily as needed (dry/irritated eyes.).    Marland Kitchen potassium chloride (K-DUR) 10 MEQ tablet TAKE 2 TABLETS BY MOUTH ONCE DAILY (Patient taking differently: Take 20 mEq by mouth daily in the afternoon.  Mid afternoon) 180 tablet 3  . simvastatin (ZOCOR) 20 MG tablet TAKE 1 TABLET BY MOUTH AT BEDTIME (Patient taking differently: Take 20 mg by mouth at bedtime. ) 90 tablet 0  . timolol (TIMOPTIC) 0.5 % ophthalmic solution Place 1 drop into both eyes 2 (two) times daily.    . TRAVATAN Z 0.004 % SOLN ophthalmic solution Place 1 drop into both eyes at bedtime. 15 mL 0  . vitamin B-12 (CYANOCOBALAMIN) 1000 MCG tablet Take 2,000 mcg by mouth daily at 3 pm.    . HYDROcodone-acetaminophen (NORCO) 10-325 MG tablet TAKE 1 TABLET BY MOUTH 4 TIMES DAILY    . NARCAN 4 MG/0.1ML LIQD nasal spray kit CALL 911. ADMINISTER A SINGLE SPRAY OF NARCAN IN ONE NOSTRIL. REPEAT EVERY 3 MINUTES AS NEEDED IF NO OR MINIMAL RESPONSE.    . traMADol (ULTRAM) 50 MG tablet Take 2 tablets (100 mg total) by mouth every 6 (six) hours as needed for moderate pain. (Patient not taking: Reported on 09/20/2019) 120 tablet 5   No current facility-administered medications for this encounter.     REVIEW OF SYSTEMS: On review of systems, the patient reports that she is doing poorly overall with her pain medication. She reports that she sees Dr. Sherwood Gambler next week and plans to discuss this further. She is unsure what to expect, but describes band like distribution of pain around her upper abdomen below her breasts. The pain has a shooting quality and can last for minutes to hours. She denies any chest pain, shortness of breath, cough, fevers, chills, night sweats, unintended weight changes. She is not having any left hip pain or other joint aches. She denies any bowel or bladder disturbances, and denies abdominal pain, nausea or vomiting. She denies any new musculoskeletal or joint aches or pains, new skin lesions or concerns. A complete review of systems is obtained and is otherwise negative.     PHYSICAL EXAM:  Unable to assess due to encounter type.   ECOG = 1  0 - Asymptomatic (Fully active, able to carry on all predisease activities  without restriction)  1 - Symptomatic but completely ambulatory (Restricted in physically strenuous activity but ambulatory and able to carry out work of a light or sedentary nature. For example, light housework, office work)  2 - Symptomatic, <50% in bed during the day (Ambulatory and capable of all self care but unable to carry out any work activities. Up and about more than 50% of waking hours)  3 - Symptomatic, >50% in bed, but not bedbound (Capable of only limited self-care, confined to bed or chair 50% or more of waking hours)  4 - Bedbound (Completely disabled. Cannot carry on any self-care. Totally confined to bed or chair)  5 - Death   Eustace Pen MM, Creech RH, Tormey DC, et al. [phone]). "Toxicity and response criteria of the [facility]". [place] Oncol. 5 (6): 649-55    LABORATORY DATA:  Lab Results  Component Value Date   WBC 12.5 (H) 08/02/2019   HGB 13.9 08/02/2019   HCT 40.7 08/02/2019   MCV 98.3  08/02/2019   PLT 300 08/02/2019   Lab Results  Component Value Date   NA 136 08/02/2019   K 3.7 08/02/2019   CL 100 08/02/2019   CO2 24 08/02/2019   Lab Results  Component Value Date   ALT 51 (H) 06/13/2019   AST 66 (H) 06/13/2019   ALKPHOS 119 06/13/2019   BILITOT 1.0 06/13/2019      RADIOGRAPHY: CT CHEST W CONTRAST  Result Date: 08/29/2019 CLINICAL DATA:  Metastatic adenocarcinoma on right T8 lytic vertebral osseous biopsy 07/27/2019 compatible with stage IV adenocarcinoma of unknown primary. History of breast cancer ([facility]). EXAM: CT CHEST WITH CONTRAST TECHNIQUE: Multidetector CT imaging of the chest was performed during intravenous contrast administration. CONTRAST:  39m OMNIPAQUE IOHEXOL 300 MG/ML  SOLN COMPARISON:  06/13/2019 chest radiograph. FINDINGS: Cardiovascular: Top-normal heart size. No significant pericardial effusion/thickening. Left anterior descending coronary atherosclerosis. Atherosclerotic nonaneurysmal thoracic aorta.  Top-normal caliber main pulmonary artery (3.1 cm diameter). No central pulmonary emboli. Mediastinum/Nodes: Subcentimeter hypodense anterior upper right thyroid nodule, requiring no follow-up. Unremarkable esophagus. No pathologically enlarged axillary, mediastinal or hilar lymph nodes. Lungs/Pleura: No pneumothorax. No pleural effusion. Lobular 3.5 x 2.7 cm solid left lung mass centered in anterior left lower lobe (series 4/image 69) with apparent direct extension into the lingula. No acute consolidative airspace disease or additional significant pulmonary nodules. Minimal patchy subpleural reticulation and ground-glass opacity in the anterior mid to upper right lung, compatible with minimal radiation fibrosis. Upper abdomen: Small hiatal hernia. Musculoskeletal: Expansile lytic destructive 5.2 x 3.7 cm right T8 vertebral lesion extending into the right posterior elements (series 4/image 85). No additional osseous lesions in the chest. Marked thoracic spondylosis. Status post bilateral posterior spinal fusion T6-T8. IMPRESSION: 1. Lobular 3.5 cm solid left lung mass centered in the anterior left lower lobe with apparent direct extension into the lingula, compatible with primary bronchogenic carcinoma. 2. Expansile lytic 5.2 cm right T8 vertebral lesion extending into the right posterior elements, compatible with osseous metastasis. 3. No additional potential sites of metastatic disease in the chest. Consider PET-CT for further staging evaluation. 4. Coronary atherosclerosis. 5. Small hiatal hernia. 6. Aortic Atherosclerosis (ICD10-I70.0). Electronically Signed   By: JIlona SorrelM.D.   On: 08/29/2019 10:30   CT THORACIC SPINE W CONTRAST  Result Date: 08/27/2019 CLINICAL DATA:  SRS treatment planning. FLUOROSCOPY TIME:  Fluoroscopy Time: 52 seconds Radiation Exposure Index: 181.58 microGray*m^2 PROCEDURE: LUMBAR PUNCTURE FOR THORACIC MYELOGRAM After thorough discussion of risks and benefits of the procedure  including bleeding, infection, injury to nerves, blood vessels, adjacent structures as well as headache and CSF leak, written and oral informed consent was obtained. Consent was obtained by Dr. ALogan Bores Patient was positioned prone on the fluoroscopy table. Local anesthesia was provided with 1% lidocaine without epinephrine after prepped and draped in the usual sterile fashion. Puncture was performed at L2-3 using a 3 1/2 inch 22-gauge spinal needle via a right interlaminar approach. Using a single pass through the dura, the needle was placed within the thecal sac, with return of clear CSF. 10 mL of Isovue M-300 was injected into the thecal sac, with normal opacification of the nerve roots and cauda equina consistent with free flow within the subarachnoid space. The patient was then moved to the trendelenburg position and contrast flowed into the Thoracic spine region. I personally performed the lumbar puncture and administered the intrathecal contrast. I also personally supervised acquisition of the myelogram images. TECHNIQUE: Contiguous axial images were obtained through the Thoracic spine after  the intrathecal infusion of infusion. Coronal and sagittal reconstructions were obtained of the axial image sets. COMPARISON:  Thoracic spine MRI 07/19/2019 FINDINGS: THORACIC MYELOGRAM FINDINGS: Sequelae of T7-T9 posterior fusion are identified without evidence of compressive spinal stenosis. Scattered small ventral extradural defects are noted elsewhere in the thoracic spine with detailed assessment of degenerative changes deferred to CT. CT THORACIC MYELOGRAM FINDINGS: There is 3 mm anterolisthesis of T2 on T3 and trace anterolisthesis of T4 on T5. No acute fracture is identified. Aortic atherosclerosis, coronary atherosclerosis, and a small sliding hiatal hernia are noted. Sequelae of interval T7-T9 posterior fusion are identified including right-sided laminectomy and facetectomies at T8. There are bilateral  pedicle screws at T7 and T9 and a left-sided screw at T8. The screws appear well-positioned without evidence of loosening. Bone graft material is noted along the posterior elements on the left. A destructive lesion is again seen in the right aspect of the T8 vertebral body with involvement of the superior and inferior endplates as well as posterior and lateral vertebral body cortex. Soft tissue lateral to the T8 vertebral body on the right and in the right ventrolateral and dorsolateral epidural space may reflect a combination of extraosseous tumor and postoperative changes. This results in mild spinal stenosis at T7-8 with mild flattening of the right ventral aspect of the spinal cord. C6-7: Interbody and bilateral facet ankylosis. No stenosis. C7-T1: Mild to moderate disc space narrowing. Mild disc bulging, endplate spurring, and mild-to-moderate facet arthrosis result in mild left neural foraminal stenosis without spinal stenosis. T1-2: Mild-to-moderate disc space narrowing. Disc bulging, endplate spurring, and moderate right and mild left facet arthrosis without significant stenosis. T2-3: Mild-to-moderate disc space narrowing with vacuum disc. Anterolisthesis with bulging uncovered disc, a moderate-sized central pseudo disc extrusion, and moderate facet arthrosis result in mild spinal stenosis with mild cord flattening. No significant neural foraminal stenosis. T3-4: Severe disc space narrowing. Endplate spurring, disc space height loss, and facet arthrosis without significant stenosis. Bilateral facet ankylosis. T4-5: Mild-to-moderate disc space narrowing and vacuum disc. Mild disc bulging and severe right and mild left facet arthrosis without stenosis. T5-6: Mild-to-moderate disc space narrowing with vacuum disc. Moderate right and mild left facet arthrosis and a tiny central disc protrusion without stenosis. T6-7: Moderate facet arthrosis without disc herniation or stenosis. T7-8: Posterior decompression and  fusion with extraosseous tumor and mild spinal stenosis as above. T8-9: Posterior fusion. Effacement of the right neural foramen by tumor. No spinal stenosis. T9-10: Mild right and moderate to severe left facet arthrosis without disc herniation or stenosis. T10-11: Tiny calcified central disc protrusion and mild-to-moderate facet arthrosis without stenosis. T11-12: Mild disc bulging and moderate right and mild left facet arthrosis without stenosis. T12-L1: Shallow, broad, partially calcified central disc protrusion without stenosis. IMPRESSION: 1. Known destructive tumor involving the right aspect of the T8 vertebral body status post interval T7-T9 posterior decompression fusion. 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 2. Multilevel disc and advanced facet degeneration in the thoracic spine with grade 1 anterolisthesis and mild spinal stenosis at T2-3. Aortic Atherosclerosis (ICD10-I70.0). Electronically Signed   By: Logan Bores M.D.   On: 08/27/2019 15:38   NM PET Image Initial (PI) Skull Base To Thigh  Result Date: 09/05/2019 CLINICAL DATA:  Initial treatment strategy for breast cancer. EXAM: NUCLEAR MEDICINE PET SKULL BASE TO THIGH TECHNIQUE: 9.3 mCi F-18 FDG was injected intravenously. Full-ring PET imaging was performed from the skull base to thigh after the radiotracer. CT data was obtained  and used for attenuation correction and anatomic localization. Fasting blood glucose: 138 mg/dl COMPARISON:  None FINDINGS: Mediastinal blood pool activity: SUV max 3.65 Liver activity: SUV max NA NECK: No hypermetabolic lymph nodes in the neck. Incidental CT findings: none CHEST: Mild FDG uptake associated with left hilar lymph node. 3.2 cm left mid lung mass which crosses the oblique fissure is again noted. SUV max associated with this nodule is equal to 14.56. Incidental CT findings: Aortic atherosclerosis. Lad coronary artery calcifications. ABDOMEN/PELVIS: No abnormal hypermetabolic activity within the  liver, pancreas, adrenal glands, or spleen. No hypermetabolic lymph nodes in the abdomen or pelvis. Incidental CT findings: Aortic atherosclerosis. SKELETON: Expansile lytic lesion involving the T8 vertebra has an SUV max of 15.52. Within the right side of the T9 vertebral body there is a lucent lesion which has an SUV max of 14.18. A second area of increased uptake localizes to the posterior elements of T9 within SUV max of 9.66. Lytic lesion involving the left iliac bone measures 1.6 cm and has an SUV max of 15.39. Incidental CT findings: none IMPRESSION: 1. Intense FDG uptake is associated with the lytic bone metastasis involving T8 and T9 vertebra and the left iliac bone. 2. Left mid lung mass measures 3.2 cm and extends across the oblique fissure. This is intensely FDG avid compatible with pulmonary metastasis or primary pulmonary neoplasm. 3. Mild FDG uptake associated with left hilar lymph node. Cannot rule out nodal metastasis. Electronically Signed   By: Kerby Moors M.D.   On: 09/05/2019 13:56   DG MYELOGRAPHY LUMBAR INJ THORACIC  Result Date: 08/27/2019 CLINICAL DATA:  SRS treatment planning. FLUOROSCOPY TIME:  Fluoroscopy Time: 52 seconds Radiation Exposure Index: 181.58 microGray*m^2 PROCEDURE: LUMBAR PUNCTURE FOR THORACIC MYELOGRAM After thorough discussion of risks and benefits of the procedure including bleeding, infection, injury to nerves, blood vessels, adjacent structures as well as headache and CSF leak, written and oral informed consent was obtained. Consent was obtained by Dr. Logan Bores. Patient was positioned prone on the fluoroscopy table. Local anesthesia was provided with 1% lidocaine without epinephrine after prepped and draped in the usual sterile fashion. Puncture was performed at L2-3 using a 3 1/2 inch 22-gauge spinal needle via a right interlaminar approach. Using a single pass through the dura, the needle was placed within the thecal sac, with return of clear CSF. 10 mL of  Isovue M-300 was injected into the thecal sac, with normal opacification of the nerve roots and cauda equina consistent with free flow within the subarachnoid space. The patient was then moved to the trendelenburg position and contrast flowed into the Thoracic spine region. I personally performed the lumbar puncture and administered the intrathecal contrast. I also personally supervised acquisition of the myelogram images. TECHNIQUE: Contiguous axial images were obtained through the Thoracic spine after the intrathecal infusion of infusion. Coronal and sagittal reconstructions were obtained of the axial image sets. COMPARISON:  Thoracic spine MRI 07/19/2019 FINDINGS: THORACIC MYELOGRAM FINDINGS: Sequelae of T7-T9 posterior fusion are identified without evidence of compressive spinal stenosis. Scattered small ventral extradural defects are noted elsewhere in the thoracic spine with detailed assessment of degenerative changes deferred to CT. CT THORACIC MYELOGRAM FINDINGS: There is 3 mm anterolisthesis of T2 on T3 and trace anterolisthesis of T4 on T5. No acute fracture is identified. Aortic atherosclerosis, coronary atherosclerosis, and a small sliding hiatal hernia are noted. Sequelae of interval T7-T9 posterior fusion are identified including right-sided laminectomy and facetectomies at T8. There are bilateral pedicle screws at  T7 and T9 and a left-sided screw at T8. The screws appear well-positioned without evidence of loosening. Bone graft material is noted along the posterior elements on the left. A destructive lesion is again seen in the right aspect of the T8 vertebral body with involvement of the superior and inferior endplates as well as posterior and lateral vertebral body cortex. Soft tissue lateral to the T8 vertebral body on the right and in the right ventrolateral and dorsolateral epidural space may reflect a combination of extraosseous tumor and postoperative changes. This results in mild spinal  stenosis at T7-8 with mild flattening of the right ventral aspect of the spinal cord. C6-7: Interbody and bilateral facet ankylosis. No stenosis. C7-T1: Mild to moderate disc space narrowing. Mild disc bulging, endplate spurring, and mild-to-moderate facet arthrosis result in mild left neural foraminal stenosis without spinal stenosis. T1-2: Mild-to-moderate disc space narrowing. Disc bulging, endplate spurring, and moderate right and mild left facet arthrosis without significant stenosis. T2-3: Mild-to-moderate disc space narrowing with vacuum disc. Anterolisthesis with bulging uncovered disc, a moderate-sized central pseudo disc extrusion, and moderate facet arthrosis result in mild spinal stenosis with mild cord flattening. No significant neural foraminal stenosis. T3-4: Severe disc space narrowing. Endplate spurring, disc space height loss, and facet arthrosis without significant stenosis. Bilateral facet ankylosis. T4-5: Mild-to-moderate disc space narrowing and vacuum disc. Mild disc bulging and severe right and mild left facet arthrosis without stenosis. T5-6: Mild-to-moderate disc space narrowing with vacuum disc. Moderate right and mild left facet arthrosis and a tiny central disc protrusion without stenosis. T6-7: Moderate facet arthrosis without disc herniation or stenosis. T7-8: Posterior decompression and fusion with extraosseous tumor and mild spinal stenosis as above. T8-9: Posterior fusion. Effacement of the right neural foramen by tumor. No spinal stenosis. T9-10: Mild right and moderate to severe left facet arthrosis without disc herniation or stenosis. T10-11: Tiny calcified central disc protrusion and mild-to-moderate facet arthrosis without stenosis. T11-12: Mild disc bulging and moderate right and mild left facet arthrosis without stenosis. T12-L1: Shallow, broad, partially calcified central disc protrusion without stenosis. IMPRESSION: 1. Known destructive tumor involving the right aspect of  the T8 vertebral body status post interval T7-T9 posterior decompression fusion. 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 2. Multilevel disc and advanced facet degeneration in the thoracic spine with grade 1 anterolisthesis and mild spinal stenosis at T2-3. Aortic Atherosclerosis (ICD10-I70.0). Electronically Signed   By: Logan Bores M.D.   On: 08/27/2019 15:38   MM DIAG BREAST TOMO BILATERAL  Result Date: 09/07/2019 CLINICAL DATA:  79 year old female with history of right breast cancer status post lumpectomy 12/25/2015 status post lumpectomy. The patient recently had a biopsy of a spine lesion the demonstrating metastatic adenocarcinoma of unknown primary. EXAM: DIGITAL DIAGNOSTIC BILATERAL MAMMOGRAM WITH CAD AND TOMO COMPARISON:  Previous exam(s). ACR Breast Density Category c: The breast tissue is heterogeneously dense, which may obscure small masses. FINDINGS: The right breast lumpectomy site is stable. No suspicious calcifications, masses or areas of distortion are seen in the bilateral breasts. Mammographic images were processed with CAD. IMPRESSION: Stable right breast lumpectomy site. No mammographic evidence of malignancy in the bilateral breasts. RECOMMENDATION: Diagnostic mammogram is suggested in 1 year. (Code:[id number]) I have discussed the findings and recommendations with the patient. If applicable, a reminder letter will be sent to the patient regarding the next appointment. BI-RADS CATEGORY  2: Benign. Electronically Signed   By: Ammie Ferrier M.D.   On: 09/07/2019 14:26  IMPRESSION/PLAN: 1. 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 Dr. Lisbeth Renshaw discusses the course since we last saw the patient and reviewed the imaging findngs with her. Unfortunately it sounds like her pain continues to be from her thoracic spine rather than from her lung tumor. Dr. Lisbeth Renshaw echoed the concern that her disease is not curable, but could be treated still somewhat in  an aggressive way to try to achieve local control of her lung tumor and to also treat the iliac wing. The approach could actually be ablative with stereotactic body radiotherapy (SBRT) rather than palliative. Despite this she does understand that overall treatments otherwise would remain with palliative goals. We discussed the risks, benefits, short, and long term effects of radiotherapy.  Dr. Lisbeth Renshaw discusses the delivery and logistics of radiotherapy and anticipates a course of 3-5 fractions to the left iliac and left lung tumors. At the end of the conversation, she really is not interested in any form of therapy other than trying to control her persistent thoracic pain. We discussed options of further discussion with Dr. Sherwood Gambler, and also offered palliative care clinic visit, though she is not interested in scheduling this at this time. We will follow along expectantly bout would be happy to revisit this conversation if she wishes.  2. 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   This encounter was provided by telemedicine platform Webex.  The patient has provided two factor identification and has given verbal consent for this type of encounter and has been advised to only accept a meeting of this type in a secure network environment. The time spent during this encounter was 45 minutes including preparation, discussion, and coordination of the patient's care. The attendants for this meeting include  Dr. Lisbeth Renshaw, Hayden Pedro  and Spero Geralds and her husband Ronniesha Seibold. During the encounter,  Dr. Lisbeth Renshaw, and Hayden Pedro were located at [facility] Radiation Oncology Department.  Connie Lang and her husband were also located in our clinic.    The above documentation reflects my direct findings during this shared patient visit. Please see the separate note by Dr. Lisbeth Renshaw on  this date for the remainder of the patient's plan of care.    Carola Rhine, PAC

## 2019-09-25 ENCOUNTER — Inpatient Hospital Stay: Payer: Medicare Other

## 2019-09-25 ENCOUNTER — Telehealth: Payer: Self-pay | Admitting: Radiation Oncology

## 2019-09-25 DIAGNOSIS — Z6833 Body mass index (BMI) 33.0-33.9, adult: Secondary | ICD-10-CM | POA: Diagnosis not present

## 2019-09-25 DIAGNOSIS — Z981 Arthrodesis status: Secondary | ICD-10-CM | POA: Diagnosis not present

## 2019-09-25 DIAGNOSIS — C3490 Malignant neoplasm of unspecified part of unspecified bronchus or lung: Secondary | ICD-10-CM | POA: Diagnosis not present

## 2019-09-25 DIAGNOSIS — C7951 Secondary malignant neoplasm of bone: Secondary | ICD-10-CM | POA: Diagnosis not present

## 2019-09-25 NOTE — Telephone Encounter (Signed)
The patient called and let us know she had developed pain in the left lower thorax along the base of her rib cage not as high as the pain she had previously described from the T8 region. Her lung tumor is posterior but closer to the upper aspect of the lung, rather than in the area she describes. After discussing her symptoms with Dr. Lisbeth Renshaw, she is interested in proceeding with the SBRT approach with 3-5 fractions to the left iliac wing that may be the source of her pain, as well as the lung lesion. She will come tomorrow for simulation at which time she will sign consent to proceed.    Carola Rhine, PAC

## 2019-09-26 ENCOUNTER — Other Ambulatory Visit: Payer: Self-pay

## 2019-09-26 ENCOUNTER — Ambulatory Visit
Admission: RE | Admit: 2019-09-26 | Discharge: 2019-09-26 | Disposition: A | Discharge status: Home / Self Care | Payer: Medicare Other | Source: Ambulatory Visit | Attending: Radiation Oncology | Admitting: Radiation Oncology

## 2019-09-26 DIAGNOSIS — C7951 Secondary malignant neoplasm of bone: Secondary | ICD-10-CM | POA: Diagnosis not present

## 2019-09-26 DIAGNOSIS — Z853 Personal history of malignant neoplasm of breast: Secondary | ICD-10-CM | POA: Insufficient documentation

## 2019-09-26 DIAGNOSIS — Z510 Encounter for antineoplastic radiation therapy: Secondary | ICD-10-CM | POA: Diagnosis not present

## 2019-09-26 DIAGNOSIS — C3432 Malignant neoplasm of lower lobe, left bronchus or lung: Secondary | ICD-10-CM | POA: Diagnosis not present

## 2019-09-26 DIAGNOSIS — C7949 Secondary malignant neoplasm of other parts of nervous system: Secondary | ICD-10-CM | POA: Insufficient documentation

## 2019-09-26 MED ORDER — OXYCODONE HCL 5 MG PO TABS: 5.0000 mg | ORAL_TABLET | ORAL | 0 refills | Status: DC | PRN

## 2019-09-26 NOTE — Progress Notes (Signed)
The patient was seen today to discuss options of radiotherapy.  We talked with her yesterday about her symptoms which to me at that time sounded to be more along the lower part of the flank possibly from her left iliac wing, and discussing her symptom location and examining her site of concern, this seems to be closer towards the 10th rib on the left.  A guide wire sticker was placed, and the patient is interested in still proceeding with simulation.  I spoke with Dr. Lisbeth Renshaw about this as well, we are uncertain as to the source of her pain, but she is willing to proceed with treatment to the left iliac wing and left chest with the understanding that it may not improve her symptoms.  In the meantime she is interested in having a change in her pain regimen, she is currently taking hydrocodone, and I will send in a new prescription for oxycodone.  She understands to stop taking hydrocodone when switching to this new regimen. I also discussed introducing her to palliative medicine as she is not planning to take any systemic therapy but is going to receive radiotherapy.

## 2019-09-27 DIAGNOSIS — M25511 Pain in right shoulder: Secondary | ICD-10-CM | POA: Diagnosis not present

## 2019-09-27 DIAGNOSIS — M25512 Pain in left shoulder: Secondary | ICD-10-CM | POA: Diagnosis not present

## 2019-09-28 ENCOUNTER — Other Ambulatory Visit: Payer: Self-pay

## 2019-09-28 DIAGNOSIS — C3432 Malignant neoplasm of lower lobe, left bronchus or lung: Secondary | ICD-10-CM | POA: Diagnosis not present

## 2019-09-28 DIAGNOSIS — C7951 Secondary malignant neoplasm of bone: Secondary | ICD-10-CM | POA: Diagnosis not present

## 2019-09-28 DIAGNOSIS — Z853 Personal history of malignant neoplasm of breast: Secondary | ICD-10-CM | POA: Diagnosis not present

## 2019-09-28 DIAGNOSIS — C7949 Secondary malignant neoplasm of other parts of nervous system: Secondary | ICD-10-CM | POA: Diagnosis not present

## 2019-09-28 DIAGNOSIS — Z510 Encounter for antineoplastic radiation therapy: Secondary | ICD-10-CM | POA: Diagnosis not present

## 2019-10-01 ENCOUNTER — Ambulatory Visit: Payer: Medicare Other | Admitting: Radiation Oncology

## 2019-10-01 ENCOUNTER — Ambulatory Visit (INDEPENDENT_AMBULATORY_CARE_PROVIDER_SITE_OTHER): Payer: Medicare Other | Admitting: Family Medicine

## 2019-10-01 ENCOUNTER — Encounter: Payer: Self-pay | Admitting: Family Medicine

## 2019-10-01 VITALS — BP 120/64 | HR 100 | Temp 97.2°F | Wt 190.2 lb

## 2019-10-01 DIAGNOSIS — G4700 Insomnia, unspecified: Secondary | ICD-10-CM | POA: Diagnosis not present

## 2019-10-01 DIAGNOSIS — C3492 Malignant neoplasm of unspecified part of left bronchus or lung: Secondary | ICD-10-CM

## 2019-10-01 DIAGNOSIS — C7951 Secondary malignant neoplasm of bone: Secondary | ICD-10-CM

## 2019-10-01 DIAGNOSIS — Z853 Personal history of malignant neoplasm of breast: Secondary | ICD-10-CM | POA: Diagnosis not present

## 2019-10-01 DIAGNOSIS — Z510 Encounter for antineoplastic radiation therapy: Secondary | ICD-10-CM | POA: Diagnosis not present

## 2019-10-01 DIAGNOSIS — C3432 Malignant neoplasm of lower lobe, left bronchus or lung: Secondary | ICD-10-CM | POA: Diagnosis not present

## 2019-10-01 DIAGNOSIS — C7949 Secondary malignant neoplasm of other parts of nervous system: Secondary | ICD-10-CM | POA: Diagnosis not present

## 2019-10-01 MED ORDER — TEMAZEPAM 30 MG PO CAPS: 30.0000 mg | ORAL_CAPSULE | Freq: Every evening | ORAL | 5 refills | Status: DC | PRN

## 2019-10-01 NOTE — Progress Notes (Signed)
   Subjective:    Patient ID: Connie Lang, female    DOB: [birth year], 79 y.o.   MRN: [id number]  HPI Here follow up recent events and for advice. She had been seeing Dr. Sherwood Gambler for back pain, and he performed a vertebroplasty. However she was also found to have a mass around the spine, and this was biopsied. It turned out to be an adenocarcinoma from an unknown source. Her mammogram was normal. A PET scan and a CT scan revealed that she has a 3 cm mass in the left lung, as well as metastases in the T3 and T4 vertebrae, and in the left iliac. A CARIS molecular test revealed this to be a non-small cell adenocarcinoma of the lung with 98% confidence, and this has metastasized to the areas above. She had been seeing Dr. Lindi Adie, but he has turned her oncologic care over to Dr. Curt Bears. [name] and her husband have met with Dr. Earlie Server to discuss her treatment options. One would be infusions with immunotherapy, but this was felt to be the least favorable option. Another option is palliative chemotherapy, but [name] declined this due to the side effects involved. The third option is radiation therapy, and they agreed to pursue this. She met with Dr. Lisbeth Renshaw, and her first treatment session is scheduled for tomorrow morning. They have given her 5 mg Oxycodone to take as needed for pain control. She does have significant pain in the back and the pelvis. No trouble with urinations or BMs. Her appetite is good. She is taking Milk of Magnesia daily to prevent constipation. Her main concern today is trouble sleeping. She has had both Covid vaccines. The other issue is her macular degeneration has been accelerating rapidly, and she is quickly losing her eyesight.      Review of Systems  Constitutional: Negative.   Respiratory: Negative.   Cardiovascular: Negative.   Musculoskeletal: Positive for back pain.  Neurological: Negative.   Psychiatric/Behavioral: Positive for sleep disturbance. Negative for  agitation, behavioral problems, confusion, decreased concentration and dysphoric mood. The patient is not nervous/anxious.        Objective:   Physical Exam Constitutional:      Appearance: Normal appearance. She is not ill-appearing.  Cardiovascular:     Rate and Rhythm: Normal rate and regular rhythm.     Pulses: Normal pulses.     Heart sounds: Normal heart sounds.  Pulmonary:     Effort: Pulmonary effort is normal.     Breath sounds: Normal breath sounds.  Neurological:     General: No focal deficit present.     Mental Status: She is alert and oriented to person, place, and time.           Assessment & Plan:  She has been found to have metastatic ling cancer, and there is very little chance of curing this. She will start radiation treatments tomorrow. She is using Oxycodone for pain. For the insomnia, we will let her try Temazepam 30 mg qhs. She will maximize her nutrition. Follow up with Dr. Lisbeth Renshaw and Dr. Julien Nordmann. I explained to her and her husband about the differences between palliative care and hospice care. We agreed that she does not need either at this point, but I told her she will need palliative care at some time. We will decide together when that time comes.  Alysia Penna, MD

## 2019-10-02 ENCOUNTER — Ambulatory Visit
Admission: RE | Admit: 2019-10-02 | Discharge: 2019-10-02 | Disposition: A | Discharge status: Home / Self Care | Payer: Medicare Other | Source: Ambulatory Visit | Attending: Radiation Oncology | Admitting: Radiation Oncology

## 2019-10-02 ENCOUNTER — Other Ambulatory Visit: Payer: Self-pay

## 2019-10-02 DIAGNOSIS — C7949 Secondary malignant neoplasm of other parts of nervous system: Secondary | ICD-10-CM | POA: Diagnosis not present

## 2019-10-02 DIAGNOSIS — Z853 Personal history of malignant neoplasm of breast: Secondary | ICD-10-CM | POA: Diagnosis not present

## 2019-10-02 DIAGNOSIS — Z510 Encounter for antineoplastic radiation therapy: Secondary | ICD-10-CM | POA: Diagnosis not present

## 2019-10-04 ENCOUNTER — Ambulatory Visit
Admission: RE | Admit: 2019-10-04 | Discharge: 2019-10-04 | Disposition: A | Discharge status: Home / Self Care | Payer: Medicare Other | Source: Ambulatory Visit | Attending: Radiation Oncology | Admitting: Radiation Oncology

## 2019-10-04 ENCOUNTER — Other Ambulatory Visit: Payer: Self-pay

## 2019-10-04 DIAGNOSIS — C7949 Secondary malignant neoplasm of other parts of nervous system: Secondary | ICD-10-CM | POA: Insufficient documentation

## 2019-10-04 DIAGNOSIS — Z853 Personal history of malignant neoplasm of breast: Secondary | ICD-10-CM | POA: Diagnosis not present

## 2019-10-04 DIAGNOSIS — Z510 Encounter for antineoplastic radiation therapy: Secondary | ICD-10-CM | POA: Insufficient documentation

## 2019-10-08 ENCOUNTER — Ambulatory Visit
Admission: RE | Admit: 2019-10-08 | Discharge: 2019-10-08 | Disposition: A | Discharge status: Home / Self Care | Payer: Medicare Other | Source: Ambulatory Visit | Attending: Radiation Oncology | Admitting: Radiation Oncology

## 2019-10-08 ENCOUNTER — Telehealth: Payer: Self-pay | Admitting: Family Medicine

## 2019-10-08 ENCOUNTER — Other Ambulatory Visit: Payer: Self-pay

## 2019-10-08 DIAGNOSIS — C7949 Secondary malignant neoplasm of other parts of nervous system: Secondary | ICD-10-CM | POA: Diagnosis not present

## 2019-10-08 DIAGNOSIS — Z510 Encounter for antineoplastic radiation therapy: Secondary | ICD-10-CM | POA: Diagnosis not present

## 2019-10-08 DIAGNOSIS — Z853 Personal history of malignant neoplasm of breast: Secondary | ICD-10-CM | POA: Diagnosis not present

## 2019-10-08 NOTE — Telephone Encounter (Signed)
[facility]

## 2019-10-08 NOTE — Telephone Encounter (Signed)
Last filled 09/26/2019 This is not due for refills.  Left a detailed message on verified voice mail.

## 2019-10-09 ENCOUNTER — Ambulatory Visit: Payer: Medicare Other | Admitting: Radiation Oncology

## 2019-10-09 NOTE — Telephone Encounter (Signed)
Pt husband called back about medication refill. Advised of previous message and he said that she is at 13 days and out of medication.   She relies on medication for pain and she is in a lot of pain.

## 2019-10-10 ENCOUNTER — Ambulatory Visit: Payer: Medicare Other | Admitting: Radiation Oncology

## 2019-10-10 ENCOUNTER — Telehealth: Payer: Self-pay | Admitting: Family Medicine

## 2019-10-10 MED ORDER — OXYCODONE HCL 5 MG PO TABS: 5.0000 mg | ORAL_TABLET | ORAL | 0 refills | Status: AC | PRN

## 2019-10-10 NOTE — Addendum Note (Signed)
Addended by: Alysia Penna A on: 10/10/2019 12:56 PM   Modules accepted: Orders

## 2019-10-10 NOTE — Telephone Encounter (Signed)
Sent in for another 60 pills, but this is all I can give her unless she begins a pain management program with me. She will need to get ALL pain meds from me alone, and she will need an in person OV to get a UDS and sign a contract

## 2019-10-10 NOTE — Telephone Encounter (Signed)
Please see phone note from 10/08/2019

## 2019-10-10 NOTE — Telephone Encounter (Signed)
Pt is calling in needing a refill on Rx Oxycodone 5 MG  Pharm:  [facility] on [address].

## 2019-10-10 NOTE — Telephone Encounter (Signed)
Please advise. Last filled 09/26/2019 by PA Shona Simpson Last OV 10/01/2019

## 2019-10-12 ENCOUNTER — Ambulatory Visit
Admission: RE | Admit: 2019-10-12 | Discharge: 2019-10-12 | Disposition: A | Discharge status: Home / Self Care | Payer: Medicare Other | Source: Ambulatory Visit | Attending: Radiation Oncology | Admitting: Radiation Oncology

## 2019-10-12 ENCOUNTER — Ambulatory Visit: Payer: Medicare Other | Admitting: Radiation Oncology

## 2019-10-12 ENCOUNTER — Other Ambulatory Visit: Payer: Self-pay

## 2019-10-12 DIAGNOSIS — C7949 Secondary malignant neoplasm of other parts of nervous system: Secondary | ICD-10-CM | POA: Diagnosis not present

## 2019-10-12 DIAGNOSIS — Z510 Encounter for antineoplastic radiation therapy: Secondary | ICD-10-CM | POA: Diagnosis not present

## 2019-10-12 DIAGNOSIS — Z853 Personal history of malignant neoplasm of breast: Secondary | ICD-10-CM | POA: Diagnosis not present

## 2019-10-15 ENCOUNTER — Encounter: Payer: Self-pay | Admitting: Radiation Oncology

## 2019-10-15 ENCOUNTER — Ambulatory Visit: Payer: Medicare Other | Admitting: Radiation Oncology

## 2019-10-15 ENCOUNTER — Ambulatory Visit
Admission: RE | Admit: 2019-10-15 | Discharge: 2019-10-15 | Disposition: A | Discharge status: Home / Self Care | Payer: Medicare Other | Source: Ambulatory Visit | Attending: Radiation Oncology | Admitting: Radiation Oncology

## 2019-10-15 ENCOUNTER — Other Ambulatory Visit: Payer: Self-pay

## 2019-10-15 DIAGNOSIS — C7949 Secondary malignant neoplasm of other parts of nervous system: Secondary | ICD-10-CM | POA: Diagnosis not present

## 2019-10-15 DIAGNOSIS — Z510 Encounter for antineoplastic radiation therapy: Secondary | ICD-10-CM | POA: Diagnosis not present

## 2019-10-15 DIAGNOSIS — C3432 Malignant neoplasm of lower lobe, left bronchus or lung: Secondary | ICD-10-CM | POA: Diagnosis not present

## 2019-10-15 DIAGNOSIS — Z853 Personal history of malignant neoplasm of breast: Secondary | ICD-10-CM | POA: Diagnosis not present

## 2019-10-15 DIAGNOSIS — C7951 Secondary malignant neoplasm of bone: Secondary | ICD-10-CM | POA: Diagnosis not present

## 2019-10-17 ENCOUNTER — Ambulatory Visit: Payer: Medicare Other | Admitting: Radiation Oncology

## 2019-10-19 ENCOUNTER — Ambulatory Visit: Payer: Medicare Other | Admitting: Radiation Oncology

## 2019-10-22 ENCOUNTER — Ambulatory Visit: Payer: Medicare Other | Admitting: Radiation Oncology

## 2019-10-24 ENCOUNTER — Ambulatory Visit: Payer: Medicare Other | Admitting: Radiation Oncology

## 2019-10-26 ENCOUNTER — Ambulatory Visit: Payer: Medicare Other | Admitting: Radiation Oncology

## 2019-10-29 ENCOUNTER — Ambulatory Visit: Payer: Medicare Other | Admitting: Radiation Oncology

## 2019-10-30 ENCOUNTER — Other Ambulatory Visit: Payer: Self-pay

## 2019-10-31 ENCOUNTER — Telehealth: Payer: Self-pay | Admitting: Family Medicine

## 2019-10-31 ENCOUNTER — Ambulatory Visit (INDEPENDENT_AMBULATORY_CARE_PROVIDER_SITE_OTHER): Payer: Medicare Other | Admitting: Family Medicine

## 2019-10-31 ENCOUNTER — Ambulatory Visit: Payer: Medicare Other | Admitting: Radiation Oncology

## 2019-10-31 ENCOUNTER — Encounter: Payer: Self-pay | Admitting: Family Medicine

## 2019-10-31 VITALS — BP 120/62 | HR 106 | Temp 96.5°F | Wt 184.2 lb

## 2019-10-31 DIAGNOSIS — G8929 Other chronic pain: Secondary | ICD-10-CM | POA: Diagnosis not present

## 2019-10-31 DIAGNOSIS — F1190 Opioid use, unspecified, uncomplicated: Secondary | ICD-10-CM

## 2019-10-31 DIAGNOSIS — M546 Pain in thoracic spine: Secondary | ICD-10-CM | POA: Insufficient documentation

## 2019-10-31 MED ORDER — OXYCODONE HCL 5 MG PO TABS: 5.0000 mg | ORAL_TABLET | Freq: Four times a day (QID) | ORAL | 0 refills | Status: DC | PRN

## 2019-10-31 MED ORDER — OXYCODONE HCL 5 MG PO CAPS: 5.0000 mg | ORAL_CAPSULE | Freq: Four times a day (QID) | ORAL | 0 refills | Status: DC | PRN

## 2019-10-31 NOTE — Telephone Encounter (Signed)
I sent in for the tablets

## 2019-10-31 NOTE — Telephone Encounter (Signed)
[facility] called and stated the pt's insurance will not cover the Oxycodone 5mg  capsules but they will cover the tablets. They are wondering if he will send a new prescription in for the tablet version.   Medication Refill: Oxycodone 5mg  Tablets Pharmacy: [facility]: [phone]

## 2019-10-31 NOTE — Telephone Encounter (Signed)
Noted. Confirmed that pharmacy has received this. Nothing further needed.

## 2019-10-31 NOTE — Progress Notes (Signed)
   Subjective:    Patient ID: Connie Lang, female    DOB: [birth year], 79 y.o.   MRN: [id number]  HPI Here for pain management. She has pain in the thoracic spine from a metastatic process at T8. She had surgery to remove this but the pain persists. She uses Oxycodone as needed.  Indication for chronic opioid: thoracic back pain Medication and dose: Oxycodone 5 mg  # pills per month: 120 Last UDS date: 10-31-19 Opioid Treatment Agreement signed (Y/N): 10-31-19 Opioid Treatment Agreement last reviewed with patient:  10-31-19 [facility] reviewed this encounter (include red flags): Yes    Review of Systems     Objective:   Physical Exam        Assessment & Plan:  Pain management, meds were refilled.  Alysia Penna, MD

## 2019-11-02 LAB — PAIN MGMT, PROFILE 8 W/CONF, U
6 Acetylmorphine: NEGATIVE ng/mL
Alcohol Metabolites: NEGATIVE ng/mL (ref <500)
Alphahydroxyalprazolam: NEGATIVE ng/mL
Alphahydroxymidazolam: NEGATIVE ng/mL
Alphahydroxytriazolam: NEGATIVE ng/mL
Aminoclonazepam: NEGATIVE ng/mL
Amphetamines: NEGATIVE ng/mL
Benzodiazepines: POSITIVE ng/mL
Buprenorphine, Urine: NEGATIVE ng/mL
Cocaine Metabolite: NEGATIVE ng/mL
Codeine: NEGATIVE ng/mL
Creatinine: 25.3 mg/dL
Hydrocodone: NEGATIVE ng/mL
Hydromorphone: NEGATIVE ng/mL
Hydroxyethylflurazepam: NEGATIVE ng/mL
Lorazepam: NEGATIVE ng/mL
MDMA: NEGATIVE ng/mL
Marijuana Metabolite: NEGATIVE ng/mL
Morphine: NEGATIVE ng/mL
Nordiazepam: NEGATIVE ng/mL
Norhydrocodone: NEGATIVE ng/mL
Noroxycodone: 225 ng/mL
Opiates: NEGATIVE ng/mL
Oxazepam: 448 ng/mL
Oxidant: NEGATIVE ug/mL
Oxycodone: 138 ng/mL
Oxycodone: POSITIVE ng/mL
Oxymorphone: 300 ng/mL
Temazepam: 6502 ng/mL
pH: 7 (ref 4.5–9.0)

## 2019-11-06 ENCOUNTER — Telehealth: Payer: Self-pay | Admitting: Radiation Oncology

## 2019-11-06 NOTE — Telephone Encounter (Signed)
Opened in error as a duplicate

## 2019-11-07 NOTE — Telephone Encounter (Signed)
  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  Interval Since Last Radiation:  4 weeks   10/02/19-10/15/19 SBRT Treatment: The patient's iliac wings and the left mid lung targets were treated to 60 Gy in 5 fractions.  08/31/19 Postop SRS Treatment: The T8 vertebral body was treated to 18 Gy in a single fraction.   01/29/2016 through 02/25/2016: The patient initially received a dose of 42.5 Gy in 17 fractions to the breast using whole-breast tangent fields. This was delivered using a 3-D conformal technique. The patient then received a boost to the seroma. This delivered an additional 7.5 Gy in 3 fractions using an en face electron field due to the depth of the seroma. The total dose was 50 Gy.   Narrative:  The patient was contacted today for routine follow-up. During treatment she did very well with radiotherapy and did not have significant desquamation. She reports she is being managed by Dr. Sarajane Jews and he does not recommend palliative or hospice care at this time. She is also planning to follow up with Dr. Saintclair Halsted now that Dr. Sherwood Gambler has retired. She feels that she is doing pretty well all things considered and her pain regimen of oxycodone at night and NSAIDs during the day seems to keep her remaining bone pain under control.  Impression/Plan: 1. 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 The patient is doing well since completing SBRT to the left iliac and left lung tumors. She will continue to follow along with Dr. Sarajane Jews, and see Dr. Saintclair Halsted as well. I intend to follow up after she sees Dr. Saintclair Halsted to see how she's doing clinically. If she is thriving, it would be reasonable to follow her with serial MRI imaging of the spine since her SRS  treatment to T8, but if she is not thriving, than we would forgo further surveillance. She is in agreement with this plan. I will call her in a few weeks after she's seen Dr. Saintclair Halsted and we can discuss next steps. 2.         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 This would be followed if she resumes under the care of medical oncology.    Carola Rhine, PAC

## 2019-11-09 ENCOUNTER — Telehealth: Payer: Self-pay | Admitting: Family Medicine

## 2019-11-09 NOTE — Chronic Care Management (AMB) (Signed)
  Chronic Care Management   Outreach Note  11/09/2019 Name: Connie Lang MRN: [id number] DOB: [birth year]  Referred by: Laurey Morale, MD Reason for referral : Chronic Care Management   An unsuccessful telephone outreach was attempted today. The patient was referred to the pharmacist for assistance with care management and care coordination.   Follow Up Plan:   [place]

## 2019-11-12 ENCOUNTER — Telehealth: Payer: Self-pay | Admitting: Radiation Oncology

## 2019-11-15 NOTE — Progress Notes (Signed)
  Radiation Oncology         [phone] ________________________________  Name: Connie Lang MRN: [id number]  Date: 08/31/2019  DOB: January 31, 1941  End of Treatment Note  Diagnosis:   Lung cancer     Indication for treatment:  palliative       Radiation treatment dates:   08/31/19  Site/dose:    PTV1 T8 target was treated using 5 Arcs to a prescription dose of 18 Gy. ExacTrac Snap verification was performed for each couch angle.  Narrative: The patient tolerated radiation treatment well.   There were no signs of acute toxicity after treatment.  Plan: The patient has completed radiation treatment. The patient will return to radiation oncology clinic for routine followup in one month. I advised the patient to call or return sooner if they have any questions or concerns related to their recovery or treatment. ________________________________  ------------------------------------------------  Jodelle Gross, MD, PhD

## 2019-11-15 NOTE — Progress Notes (Signed)
  Radiation Oncology         [phone] ________________________________  Name: Connie Lang MRN: [id number]  Date: 10/15/2019  DOB: 02/04/1941  End of Treatment Note  Diagnosis:   Non-small cell lung cancer     Indication for treatment: Palliative       Radiation treatment dates:   10/02/2019 06/2020  Site/dose:    1.  The tumor in the left lung was treated with a course of stereotactic body radiation treatment. The patient received 60 Gy In 5 fractions at 12 G per fraction. 2.  The tumor within the left bony pelvis was treated with a course of stereotactic body radiation treatment concurrently to the left lung.  The patient received 60 Gray in 5 fractions of 12 Gray per fraction.  Narrative: The patient tolerated radiation treatment relatively well.   The patient did not have any signs of acute toxicity during treatment.  Plan: The patient has completed radiation treatment. The patient will return to radiation oncology clinic for routine followup in one month. I advised the patient to call or return sooner if they have any questions or concerns related to their recovery or treatment.   ------------------------------------------------  Jodelle Gross, MD, PhD

## 2019-11-28 ENCOUNTER — Telehealth: Payer: Self-pay | Admitting: Family Medicine

## 2019-11-28 NOTE — Progress Notes (Signed)
°  Chronic Care Management   Note  11/28/2019 Name: Connie Lang MRN: [id number] DOB: October 10, 1940  Connie Lang is a 79 y.o. year old female who is a primary care patient of Laurey Morale, MD. I reached out to Spero Geralds by phone today in response to a referral sent by Connie Lang's PCP, Laurey Morale, MD.   Connie Lang was given information about Chronic Care Management services today including:  1. CCM service includes personalized support from designated clinical staff supervised by her physician, including individualized plan of care and coordination with other care providers 2. 24/7 contact phone numbers for assistance for urgent and routine care needs. 3. Service will only be billed when office clinical staff spend 20 minutes or more in a month to coordinate care. 4. Only one practitioner may furnish and bill the service in a calendar month. 5. The patient may stop CCM services at any time (effective at the end of the month) by phone call to the office staff.   Patient agreed to services and verbal consent obtained.   This note is not being shared with the patient for the following reason: To respect privacy (The patient or proxy has requested that the information not be shared).   Follow up plan:  [place]

## 2019-12-07 ENCOUNTER — Emergency Department (HOSPITAL_COMMUNITY): Payer: Medicare Other

## 2019-12-07 ENCOUNTER — Inpatient Hospital Stay (HOSPITAL_COMMUNITY)
Admission: EM | Admit: 2019-12-07 | Discharge: 2019-12-13 | DRG: 193 | Disposition: A | Discharge status: Hospice | Payer: Medicare Other | Attending: Internal Medicine | Admitting: Internal Medicine

## 2019-12-07 ENCOUNTER — Other Ambulatory Visit: Payer: Self-pay

## 2019-12-07 DIAGNOSIS — C3490 Malignant neoplasm of unspecified part of unspecified bronchus or lung: Secondary | ICD-10-CM | POA: Diagnosis present

## 2019-12-07 DIAGNOSIS — Z853 Personal history of malignant neoplasm of breast: Secondary | ICD-10-CM | POA: Diagnosis not present

## 2019-12-07 DIAGNOSIS — Z923 Personal history of irradiation: Secondary | ICD-10-CM

## 2019-12-07 DIAGNOSIS — R0602 Shortness of breath: Secondary | ICD-10-CM

## 2019-12-07 DIAGNOSIS — Z171 Estrogen receptor negative status [ER-]: Secondary | ICD-10-CM | POA: Diagnosis not present

## 2019-12-07 DIAGNOSIS — M549 Dorsalgia, unspecified: Secondary | ICD-10-CM | POA: Diagnosis present

## 2019-12-07 DIAGNOSIS — I10 Essential (primary) hypertension: Secondary | ICD-10-CM | POA: Diagnosis not present

## 2019-12-07 DIAGNOSIS — H3530 Unspecified macular degeneration: Secondary | ICD-10-CM | POA: Diagnosis not present

## 2019-12-07 DIAGNOSIS — I447 Left bundle-branch block, unspecified: Secondary | ICD-10-CM | POA: Diagnosis present

## 2019-12-07 DIAGNOSIS — K5909 Other constipation: Secondary | ICD-10-CM | POA: Diagnosis present

## 2019-12-07 DIAGNOSIS — C7951 Secondary malignant neoplasm of bone: Secondary | ICD-10-CM | POA: Diagnosis not present

## 2019-12-07 DIAGNOSIS — Z515 Encounter for palliative care: Secondary | ICD-10-CM | POA: Diagnosis not present

## 2019-12-07 DIAGNOSIS — E669 Obesity, unspecified: Secondary | ICD-10-CM | POA: Diagnosis present

## 2019-12-07 DIAGNOSIS — I440 Atrioventricular block, first degree: Secondary | ICD-10-CM | POA: Diagnosis present

## 2019-12-07 DIAGNOSIS — Z87891 Personal history of nicotine dependence: Secondary | ICD-10-CM | POA: Diagnosis not present

## 2019-12-07 DIAGNOSIS — Z8544 Personal history of malignant neoplasm of other female genital organs: Secondary | ICD-10-CM

## 2019-12-07 DIAGNOSIS — G4700 Insomnia, unspecified: Secondary | ICD-10-CM | POA: Diagnosis present

## 2019-12-07 DIAGNOSIS — L729 Follicular cyst of the skin and subcutaneous tissue, unspecified: Secondary | ICD-10-CM | POA: Diagnosis present

## 2019-12-07 DIAGNOSIS — Z806 Family history of leukemia: Secondary | ICD-10-CM

## 2019-12-07 DIAGNOSIS — Z85828 Personal history of other malignant neoplasm of skin: Secondary | ICD-10-CM

## 2019-12-07 DIAGNOSIS — Z20822 Contact with and (suspected) exposure to covid-19: Secondary | ICD-10-CM | POA: Diagnosis present

## 2019-12-07 DIAGNOSIS — H548 Legal blindness, as defined in USA: Secondary | ICD-10-CM | POA: Diagnosis not present

## 2019-12-07 DIAGNOSIS — E6609 Other obesity due to excess calories: Secondary | ICD-10-CM | POA: Diagnosis not present

## 2019-12-07 DIAGNOSIS — Z7189 Other specified counseling: Secondary | ICD-10-CM | POA: Diagnosis not present

## 2019-12-07 DIAGNOSIS — R0902 Hypoxemia: Secondary | ICD-10-CM

## 2019-12-07 DIAGNOSIS — R0689 Other abnormalities of breathing: Secondary | ICD-10-CM | POA: Diagnosis not present

## 2019-12-07 DIAGNOSIS — Z66 Do not resuscitate: Secondary | ICD-10-CM | POA: Diagnosis not present

## 2019-12-07 DIAGNOSIS — Z800 Family history of malignant neoplasm of digestive organs: Secondary | ICD-10-CM

## 2019-12-07 DIAGNOSIS — E119 Type 2 diabetes mellitus without complications: Secondary | ICD-10-CM | POA: Diagnosis present

## 2019-12-07 DIAGNOSIS — J188 Other pneumonia, unspecified organism: Secondary | ICD-10-CM | POA: Diagnosis not present

## 2019-12-07 DIAGNOSIS — E782 Mixed hyperlipidemia: Secondary | ICD-10-CM | POA: Diagnosis not present

## 2019-12-07 DIAGNOSIS — G8929 Other chronic pain: Secondary | ICD-10-CM | POA: Diagnosis present

## 2019-12-07 DIAGNOSIS — C50411 Malignant neoplasm of upper-outer quadrant of right female breast: Secondary | ICD-10-CM | POA: Diagnosis not present

## 2019-12-07 DIAGNOSIS — Z79899 Other long term (current) drug therapy: Secondary | ICD-10-CM

## 2019-12-07 DIAGNOSIS — C3492 Malignant neoplasm of unspecified part of left bronchus or lung: Secondary | ICD-10-CM | POA: Diagnosis not present

## 2019-12-07 DIAGNOSIS — Z8041 Family history of malignant neoplasm of ovary: Secondary | ICD-10-CM

## 2019-12-07 DIAGNOSIS — Z6830 Body mass index (BMI) 30.0-30.9, adult: Secondary | ICD-10-CM | POA: Diagnosis not present

## 2019-12-07 DIAGNOSIS — Z8249 Family history of ischemic heart disease and other diseases of the circulatory system: Secondary | ICD-10-CM

## 2019-12-07 DIAGNOSIS — J189 Pneumonia, unspecified organism: Secondary | ICD-10-CM | POA: Diagnosis not present

## 2019-12-07 DIAGNOSIS — Z823 Family history of stroke: Secondary | ICD-10-CM

## 2019-12-07 DIAGNOSIS — J9601 Acute respiratory failure with hypoxia: Secondary | ICD-10-CM | POA: Diagnosis not present

## 2019-12-07 DIAGNOSIS — J810 Acute pulmonary edema: Secondary | ICD-10-CM | POA: Diagnosis not present

## 2019-12-07 DIAGNOSIS — Z820 Family history of epilepsy and other diseases of the nervous system: Secondary | ICD-10-CM

## 2019-12-07 DIAGNOSIS — Z803 Family history of malignant neoplasm of breast: Secondary | ICD-10-CM

## 2019-12-07 DIAGNOSIS — R0600 Dyspnea, unspecified: Secondary | ICD-10-CM

## 2019-12-07 DIAGNOSIS — M1990 Unspecified osteoarthritis, unspecified site: Secondary | ICD-10-CM | POA: Diagnosis present

## 2019-12-07 DIAGNOSIS — Z833 Family history of diabetes mellitus: Secondary | ICD-10-CM

## 2019-12-07 DIAGNOSIS — K219 Gastro-esophageal reflux disease without esophagitis: Secondary | ICD-10-CM | POA: Diagnosis present

## 2019-12-07 LAB — COMPREHENSIVE METABOLIC PANEL
ALT: 17 U/L (ref 0–44)
AST: 21 U/L (ref 15–41)
Albumin: 2.6 g/dL — ABNORMAL LOW (ref 3.5–5.0)
Alkaline Phosphatase: 231 U/L — ABNORMAL HIGH (ref 38–126)
Anion gap: 12 (ref 5–15)
BUN: 12 mg/dL (ref 8–23)
CO2: 24 mmol/L (ref 22–32)
Calcium: 8.6 mg/dL — ABNORMAL LOW (ref 8.9–10.3)
Chloride: 98 mmol/L (ref 98–111)
Creatinine, Ser: 0.68 mg/dL (ref 0.44–1.00)
GFR calc Af Amer: >60 mL/min (ref >60)
GFR calc non Af Amer: >60 mL/min (ref >60)
Glucose, Bld: 143 mg/dL — ABNORMAL HIGH (ref 70–99)
Potassium: 4.6 mmol/L (ref 3.5–5.1)
Sodium: 134 mmol/L — ABNORMAL LOW (ref 135–145)
Total Bilirubin: 0.8 mg/dL (ref 0.3–1.2)
Total Protein: 6.9 g/dL (ref 6.5–8.1)

## 2019-12-07 LAB — CBC WITH DIFFERENTIAL/PLATELET
Abs Immature Granulocytes: 0.23 10*3/uL — ABNORMAL HIGH (ref 0.00–0.07)
Basophils Absolute: 0.1 10*3/uL (ref 0.0–0.1)
Basophils Relative: 1 %
Eosinophils Absolute: 0.1 10*3/uL (ref 0.0–0.5)
Eosinophils Relative: 1 %
HCT: 34.2 % — ABNORMAL LOW (ref 36.0–46.0)
Hemoglobin: 10.8 g/dL — ABNORMAL LOW (ref 12.0–15.0)
Immature Granulocytes: 2 %
Lymphocytes Relative: 5 %
Lymphs Abs: 0.5 10*3/uL — ABNORMAL LOW (ref 0.7–4.0)
MCH: 30.5 pg (ref 26.0–34.0)
MCHC: 31.6 g/dL (ref 30.0–36.0)
MCV: 96.6 fL (ref 80.0–100.0)
Monocytes Absolute: 1.3 10*3/uL — ABNORMAL HIGH (ref 0.1–1.0)
Monocytes Relative: 11 %
Neutro Abs: 9.6 10*3/uL — ABNORMAL HIGH (ref 1.7–7.7)
Neutrophils Relative %: 80 %
Platelets: 412 10*3/uL — ABNORMAL HIGH (ref 150–400)
RBC: 3.54 MIL/uL — ABNORMAL LOW (ref 3.87–5.11)
RDW: 15 % (ref 11.5–15.5)
WBC: 11.8 10*3/uL — ABNORMAL HIGH (ref 4.0–10.5)
nRBC: 0 % (ref 0.0–0.2)

## 2019-12-07 LAB — SARS CORONAVIRUS 2 BY RT PCR (HOSPITAL ORDER, PERFORMED IN ~~LOC~~ HOSPITAL LAB): SARS Coronavirus 2: NEGATIVE

## 2019-12-07 MED ORDER — SODIUM CHLORIDE 0.9 % IV SOLN
500.0000 mg | Freq: Once | INTRAVENOUS | Status: AC
  Administered 2019-12-07: 500 mg via INTRAVENOUS
  Filled 2019-12-07: qty 500

## 2019-12-07 MED ORDER — SODIUM CHLORIDE 0.9 % IV SOLN
1.0000 g | Freq: Once | INTRAVENOUS | Status: AC
  Administered 2019-12-07: 1 g via INTRAVENOUS
  Filled 2019-12-07: qty 10

## 2019-12-07 MED ORDER — DEXAMETHASONE SODIUM PHOSPHATE 10 MG/ML IJ SOLN
10.0000 mg | Freq: Once | INTRAMUSCULAR | Status: AC
  Administered 2019-12-07: 10 mg via INTRAVENOUS
  Filled 2019-12-07: qty 1

## 2019-12-07 MED ORDER — SODIUM CHLORIDE 0.9 % IV SOLN: INTRAVENOUS | Status: DC

## 2019-12-07 MED ORDER — IPRATROPIUM BROMIDE HFA 17 MCG/ACT IN AERS
4.0000 | INHALATION_SPRAY | Freq: Once | RESPIRATORY_TRACT | Status: AC
  Administered 2019-12-07: 4 via RESPIRATORY_TRACT
  Filled 2019-12-07: qty 12.9

## 2019-12-07 MED ORDER — POTASSIUM CHLORIDE CRYS ER 20 MEQ PO TBCR
20.0000 meq | EXTENDED_RELEASE_TABLET | Freq: Every day | ORAL | Status: DC
  Administered 2019-12-07: 20 meq via ORAL
  Filled 2019-12-07: qty 1

## 2019-12-07 MED ORDER — SODIUM CHLORIDE 0.9 % IV SOLN
500.0000 mg | INTRAVENOUS | Status: DC
  Filled 2019-12-07: qty 500

## 2019-12-07 MED ORDER — OXYCODONE HCL 5 MG PO TABS
5.0000 mg | ORAL_TABLET | Freq: Four times a day (QID) | ORAL | Status: DC | PRN
  Administered 2019-12-07 – 2019-12-09 (×6): 5 mg via ORAL
  Filled 2019-12-07 (×6): qty 1

## 2019-12-07 MED ORDER — SODIUM CHLORIDE 0.9 % IV SOLN
2.0000 g | INTRAVENOUS | Status: AC
  Administered 2019-12-08 – 2019-12-12 (×5): 2 g via INTRAVENOUS
  Filled 2019-12-07 (×3): qty 2
  Filled 2019-12-07: qty 20
  Filled 2019-12-07: qty 2

## 2019-12-07 MED ORDER — ENOXAPARIN SODIUM 40 MG/0.4ML ~~LOC~~ SOLN
40.0000 mg | Freq: Every day | SUBCUTANEOUS | Status: DC
  Administered 2019-12-08 – 2019-12-12 (×6): 40 mg via SUBCUTANEOUS
  Filled 2019-12-07 (×6): qty 0.4

## 2019-12-07 NOTE — ED Provider Notes (Signed)
[facility] DEPT Provider Note   CSN: [id number] Arrival date & time: 12/07/19  1539     History Chief Complaint  Patient presents with  . Shortness of Breath    Connie Lang is a 79 y.o. female.  79 y.o female with a PMH of DM, GERD, Anxiety, NSC stage IV presents to the ED with a chief complaint of shortness of breath x today.  Patient was diagnosed with stage IV CVA about 4 months ago, was given approximately 2 to 4 months left to live as patient states.  She reports she was in bed trying to rest this morning when she suddenly began to get very short of breath, she attempted to use a nebulizer treatment x3 without any improvement in symptoms.  She does report an increase in mucus, clear at this time, increase in rattles in her lungs.  She also endorses a left-sided rib pain which has been ongoing, currently prescribed hydrocodone.  Reports receiving care by oncologist Dr. Lisbeth Renshaw and Dr. Earlie Server.  At this time patient has decided not to proceed with radiation or chemotherapy.  Hospice has been involved in patient's care.  She is currently not on any supplemental oxygen at home.  No fevers, nausea, vomiting, prior history of clots, or chest pain.  The history is provided by the patient and medical records.  Shortness of Breath Associated symptoms: cough   Associated symptoms: no abdominal pain, no chest pain, no fever, no headaches, no sore throat and no vomiting        Past Medical History:  Diagnosis Date  . Allergy    occasionally has hayfever  . Anxiety   . Breast cancer ([place]) 2017  . Cancer ([place]) 1987   cancerous cells in vaginal wall seemed to be metastatic from the gut but no primary was ever found   . Cataract   . Chicken pox   . Depression   . DJD (degenerative joint disease)   . First degree AV block   . GERD (gastroesophageal reflux disease)   . Glaucoma    glaucoma and cataracts sees Dr Satira Sark   . Hyperlipemia   .  Hypertension    pt denies currently. " Prednisone made it high."  . LBBB (left bundle branch block)   . Lumbar disc disease    sees Dr. Sherwood Gambler   . Macular degeneration   . Mobitz type 1 second degree atrioventricular block   . Osteoarthritis   . Osteopenia   . Personal history of radiation therapy   . PMR (polymyalgia rheumatica) (HCC)    sees Dr. Leigh Aurora   . Radiation    02-25-16 last radiation for breast cancer  . Transfusion history    blood     Patient Active Problem List   Diagnosis Date Noted  . Thoracic back pain 10/31/2019  . Goals of care, counseling/discussion 09/13/2019  . Stage IV adenocarcinoma of lung ([place]) 09/13/2019  . Encounter for antineoplastic chemotherapy 09/13/2019  . Adenocarcinoma of left lung, stage 4 ([place]) 09/13/2019  . Adenocarcinoma of unknown primary ([place]) 08/23/2019  . 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 Osteoarthritis of left hip 11/11/2016  . Family history of ovarian cancer 01/27/2016  . Family history of colon cancer 01/27/2016  . Breast cancer of upper-outer quadrant of right female breast ([place]) 12/03/2015  . Generalized edema 11/21/2015  . PMR (polymyalgia rheumatica) (HCC) 07/23/2015  . RBBB (right bundle branch block) 11/01/2013  . HTN (hypertension) 11/01/2013  . Obesity, unspecified  11/01/2013  . Pure hypercholesterolemia 11/01/2013  . CARCINOMA, BASAL CELL 07/29/2009  . Hyperlipemia, mixed 07/29/2009  . GLAUCOMA 07/29/2009  . Essential hypertension 07/29/2009  . Osteoarthritis 07/29/2009  . COLONIC POLYPS, HX OF 07/29/2009    Past Surgical History:  Procedure Laterality Date  . ABDOMINAL HYSTERECTOMY     TAH BSO 1985  . APPENDECTOMY    . BASAL CELL CARCINOMA EXCISION     nose and rt thigh  . BREAST BIOPSY      x 4  . BREAST EXCISIONAL BIOPSY Right 1997  . BREAST EXCISIONAL BIOPSY Left 1985  . BREAST EXCISIONAL BIOPSY Left 1965  . BREAST EXCISIONAL BIOPSY Right 1959  . BREAST LUMPECTOMY Right 12/25/2015  .  BREAST LUMPECTOMY  02/27/2018   see report  . BREAST LUMPECTOMY WITH RADIOACTIVE SEED LOCALIZATION Right 12/25/2015   Procedure: RIGHT BREAST LUMPECTOMY WITH RADIOACTIVE SEED LOCALIZATION;  Surgeon: Rolm Bookbinder, MD;  Location: [facility];  Service: General;  Laterality: Right;  . CATARACT EXTRACTION     01-27-2011 left, 02-17-2011 right   . CERVICAL LAMINECTOMY     1983  . cervical microdiskectomy     1990 Dr Jovita Gamma  . CESAREAN SECTION    . COLONOSCOPY  03/21/2018   per Dr. Silverio Decamp, adenomatous polyps, no repeats due to age   . EYE SURGERY     bil cataracts with lens implant  . FOOT ARTHRODESIS, MODIFIED MCBRIDE Right   . LUMBAR LAMINECTOMY     1990 Dr Jovita Gamma  . MYELOGRAM    . POLYPECTOMY    . REFRACTIVE SURGERY     01-29-2009 04-07-2009 Dr Foye Clock in [place]  . spinal injection     07-30-2012, 09-24-2012  . TENDON REPAIR Right    rt wrist  . TOTAL HIP ARTHROPLASTY Right 05/13/2016   Procedure: RIGHT TOTAL HIP ARTHROPLASTY ANTERIOR APPROACH;  Surgeon: Rod Can, MD;  Location: [facility];  Service: Orthopedics;  Laterality: Right;  [place] RNFA  . TOTAL HIP ARTHROPLASTY Left 11/11/2016   Procedure: LEFT TOTAL HIP ARTHROPLASTY ANTERIOR APPROACH;  Surgeon: Rod Can, MD;  Location: [facility];  Service: Orthopedics;  Laterality: Left;  Dr. requesting RNFA  . [place]     x3  . WRIST SURGERY Right    tendon repair     OB History   No obstetric history on file.     Family History  Problem Relation Age of Onset  . Alzheimer's disease Father   . Heart failure Father        d. 69  . Stroke Mother 85  . Cancer Sister        ovarian  . Alzheimer's disease Paternal Aunt   . Breast cancer Paternal Aunt   . Colon cancer Brother        dx. 84s  . Stroke Maternal Aunt        d. 7s  . Heart disease Maternal Grandfather   . Diabetes Maternal Grandfather   . Stroke Paternal Grandmother 73  . Heart attack  Paternal Grandfather        d. 44s  . Ovarian cancer Sister        dx. 43s; no genetic testing  . Breast cancer Daughter   . Leukemia Grandchild 21  . Skin cancer Grandchild   . Cervical cancer Cousin 65       maternal 1st cousin  . Alzheimer's disease Paternal Aunt        (x4)  paternal aunts  . Heart attack Paternal Uncle 75  . Colon polyps Neg Hx   . Rectal cancer Neg Hx   . Stomach cancer Neg Hx     Social History   Tobacco Use  . Smoking status: Former Smoker    Packs/day: 0.50    Years: 36.00    Pack years: 18.00    Types: Cigarettes    Quit date: 07/05/1994    Years since quitting: 25.4  . Smokeless tobacco: Never Used  Substance Use Topics  . Alcohol use: Yes    Alcohol/week: 7.0 standard drinks    Types: 7 Glasses of wine per week    Comment: 08/02/2019- not currently- taking pain medications  . Drug use: No    Home Medications Prior to Admission medications   Medication Sig Start Date End Date Taking? Authorizing Provider  acetaminophen (TYLENOL) 650 MG CR tablet Take 650-1,300 mg by mouth every 8 (eight) hours as needed for pain.    [provider]  amLODipine (NORVASC) 5 MG tablet TAKE 1 TABLET BY MOUTH ONCE DAILY IN THE EVENING 09/03/19   Laurey Morale, MD  anti-nausea (EMETROL) solution Take 10 mLs by mouth every 15 (fifteen) minutes as needed for nausea or vomiting.    [provider]  chlorpheniramine (CHLOR-TRIMETON) 4 MG tablet Take 4 mg by mouth daily as needed for allergies.    [provider]  cyclobenzaprine (FLEXERIL) 10 MG tablet Take 1 tablet by mouth three times daily as needed for muscle spasm 09/03/19   Laurey Morale, MD  diphenhydrAMINE (BENADRYL) 12.5 MG chewable tablet Chew 12.5 mg by mouth 2 (two) times daily as needed (for allergic reaction (lip swelling)).    [provider]  furosemide (LASIX) 40 MG tablet Take 1 tablet by mouth twice daily Patient taking differently: Take 40 mg by mouth 2 (two) times  daily.  06/14/19   Laurey Morale, MD  gabapentin (NEURONTIN) 300 MG capsule Take 1 capsule (300 mg total) by mouth at bedtime. 09/10/19   Nicholas Lose, MD  guaifenesin (TUSSIN) 100 MG/5ML syrup Take 200 mg by mouth 3 (three) times daily as needed for cough.    [provider]  ibuprofen (ADVIL,MOTRIN) 200 MG tablet Take 400 mg by mouth every 8 (eight) hours as needed (for pain).     [provider]  losartan (COZAAR) 50 MG tablet Take 1 tablet by mouth once daily Patient taking differently: Take 50 mg by mouth every evening.  06/14/19   Laurey Morale, MD  Multiple Vitamins-Minerals (PRESERVISION AREDS 2 PO) Take 1 tablet by mouth 2 (two) times daily.    [provider]  NARCAN 4 MG/0.1ML LIQD nasal spray kit CALL 911. ADMINISTER A SINGLE SPRAY OF NARCAN IN ONE NOSTRIL. REPEAT EVERY 3 MINUTES AS NEEDED IF NO OR MINIMAL RESPONSE. 09/10/19   [provider]  oxyCODONE (ROXICODONE) 5 MG immediate release tablet Take 1 tablet (5 mg total) by mouth every 6 (six) hours as needed for severe pain. 12/31/19 01/30/20  Laurey Morale, MD  oxymetazoline (AFRIN) 0.05 % nasal spray Place 1 spray into both nostrils at bedtime.     [provider]  pantoprazole (PROTONIX) 40 MG tablet Take 1 tablet by mouth once daily Patient taking differently: Take 40 mg by mouth daily.  06/14/19   Laurey Morale, MD  Polyethyl Glycol-Propyl Glycol (SYSTANE) 0.4-0.3 % SOLN Place 1 drop into both eyes 3 (three) times daily as needed (dry/irritated eyes.).  [provider]  potassium chloride (K-DUR) 10 MEQ tablet TAKE 2 TABLETS BY MOUTH ONCE DAILY Patient taking differently: Take 20 mEq by mouth daily in the afternoon. Mid afternoon 01/23/19   Laurey Morale, MD  temazepam (RESTORIL) 30 MG capsule Take 1 capsule (30 mg total) by mouth at bedtime as needed for sleep. 10/01/19   Laurey Morale, MD  timolol (TIMOPTIC) 0.5 % ophthalmic solution Place 1 drop into both eyes 2 (two) times  daily. 06/15/19   [provider]  TRAVATAN Z 0.004 % SOLN ophthalmic solution Place 1 drop into both eyes at bedtime. 12/07/18   Laurey Morale, MD  vitamin B-12 (CYANOCOBALAMIN) 1000 MCG tablet Take 2,000 mcg by mouth daily at 3 pm.    [provider]  Calcium Carbonate-Vit D-Min 1200-1000 MG-UNIT CHEW Chew 1 tablet by mouth daily.    09/16/11  [provider]    Allergies    Patient has no known allergies.  Review of Systems   Review of Systems  Constitutional: Negative for chills and fever.  HENT: Positive for voice change. Negative for sinus pressure and sore throat.   Respiratory: Positive for cough and shortness of breath.   Cardiovascular: Negative for chest pain, palpitations and leg swelling.  Gastrointestinal: Negative for abdominal pain, nausea and vomiting.  Genitourinary: Negative for flank pain.  Neurological: Negative for light-headedness and headaches.  All other systems reviewed and are negative.   Physical Exam Updated Vital Signs BP 114/70   Pulse 89   Temp 98.5 F (36.9 C) (Oral)   Resp 19   Ht _0  (1.626 m)   Wt 81.6 kg   LMP  (LMP Unknown) Comment: full  SpO2 (!) 86%   BMI 30.90 kg/m   Physical Exam Vitals and nursing note reviewed.  Constitutional:      Appearance: She is well-developed. She is ill-appearing.  HENT:     Head: Normocephalic and atraumatic.  Eyes:     Pupils: Pupils are equal, round, and reactive to light.  Cardiovascular:     Rate and Rhythm: Normal rate.     Comments: No bilateral pitting edema. Pulmonary:     Effort: Pulmonary effort is normal. No tachypnea.     Breath sounds: Examination of the right-lower field reveals rales. Examination of the left-lower field reveals wheezing and rales. Decreased breath sounds, wheezing and rales present.     Comments: Currently on 5 L via nasal cannula. Chest:     Chest wall: No tenderness.  Musculoskeletal:     Right lower leg: No edema.     Left lower leg:  No edema.     Comments: No BL calf tenderness.   Skin:    General: Skin is warm and dry.  Neurological:     Mental Status: She is alert and oriented to person, place, and time.     ED Results / Procedures / Treatments   Labs (all labs ordered are listed, but only abnormal results are displayed) Labs Reviewed  CBC WITH DIFFERENTIAL/PLATELET - Abnormal; Notable for the following components:      Result Value   WBC 11.8 (*)    RBC 3.54 (*)    Hemoglobin 10.8 (*)    HCT 34.2 (*)    Platelets 412 (*)    Neutro Abs 9.6 (*)    Lymphs Abs 0.5 (*)    Monocytes Absolute 1.3 (*)    Abs Immature Granulocytes 0.23 (*)    All other components within  normal limits  COMPREHENSIVE METABOLIC PANEL - Abnormal; Notable for the following components:   Sodium 134 (*)    Glucose, Bld 143 (*)    Calcium 8.6 (*)    Albumin 2.6 (*)    Alkaline Phosphatase 231 (*)    All other components within normal limits  SARS CORONAVIRUS 2 BY RT PCR [facility] ORDER, [place] LAB)    EKG EKG Interpretation  Date/Time:  Friday December 07 2019 16:18:41 EDT Ventricular Rate:  90 PR Interval:    QRS Duration: 163 QT Interval:  412 QTC Calculation: 505 R Axis:   -69 Text Interpretation: Sinus rhythm Left bundle branch block since last tracing no significant change Confirmed by Daleen Bo [phone]) on 12/07/2019 5:34:59 PM   Radiology DG Chest Portable 1 View  Result Date: 12/07/2019 CLINICAL DATA:  Shortness of breath. EXAM: PORTABLE CHEST 1 VIEW COMPARISON:  June 13, 2019 FINDINGS: Mild diffuse chronic appearing increased lung markings are seen. Mild to moderate severity areas of atelectasis and/or infiltrate are seen within the bilateral lung bases, right greater than left. There is no evidence of a pleural effusion or pneumothorax. The cardiac silhouette is moderately enlarged. There is marked severity calcification of the aortic arch. Bilateral pedicle screws are seen within the  midthoracic spine. This represents a new finding when compared to the prior exam. IMPRESSION: 1. Chronic appearing increased lung markings with mild to moderate severity bibasilar atelectasis and/or infiltrate, right greater than left. 2. Interval mid thoracic spine fusion. Electronically Signed   By: Virgina Norfolk M.D.   On: 12/07/2019 16:54    Procedures Procedures (including critical care time)  Medications Ordered in ED Medications  oxyCODONE (Oxy IR/ROXICODONE) immediate release tablet 5 mg (5 mg Oral Given 12/07/19 1805)  potassium chloride SA (KLOR-CON) CR tablet 20 mEq (20 mEq Oral Given 12/07/19 1805)  cefTRIAXone (ROCEPHIN) 1 g in sodium chloride 0.9 % 100 mL IVPB (has no administration in time range)  azithromycin (ZITHROMAX) 500 mg in sodium chloride 0.9 % 250 mL IVPB (has no administration in time range)  ipratropium (ATROVENT HFA) inhaler 4 puff (4 puffs Inhalation Given 12/07/19 1804)  dexamethasone (DECADRON) injection 10 mg (10 mg Intravenous Given 12/07/19 1807)    ED Course  I have reviewed the triage vital signs and the nursing notes.  Pertinent labs & imaging results that were available during my care of the patient were reviewed by me and considered in my medical decision making (see chart for details).  Clinical Course as of Dec 06 1828  Fri Dec 07, 2019  1830 SARS Coronavirus 2: NEGATIVE [JS]    Clinical Course User Index [JS] Janeece Fitting, PA-C   MDM Rules/Calculators/A&P   Patient with a past medical history of stage IV metastatic lung cancer presents to the ED with shortness of breath that began this morning, reports she was resting in her bed, suddenly became very short of breath, currently not on any supplemental oxygen at home.  Was placed on 4 L via simple facemask and maintaining her O2 sats at 94 to 93%.  She reports she has tried nebulizer treatments today without any improvement, has had an increase in rales to her lungs, mucus, no coughs or fevers but  does endorse voice changes.  He was previously followed by Dr. Earlie Server, states she then saw Dr. Lisbeth Renshaw however patient at this time has decided not to proceed with chemotherapy or radiation.  According to patient there has been no consultation with hospice  at this time.  Insert review of patient's chart shows her last visit with Dr. Lisbeth Renshaw on October 15, 2019, where she received radiation for tumor to her left lung.  Long with the tumor to her left bony pelvis.  Was her last treatment as she has completed then.  During my evaluation patient is overall ill-appearing, satting at 90% on 5 L via nasal cannula which I personally change.  Heart rate is increased in the low 90s.  She is able to speak in full sentences but does have hoarse voice present.  Blood pressure is within normal limits.  Will obtain screening labs along with chest x-ray to further evaluate condition and rule out any acute condition at this time.  CBC with slight leukocytosis, which is within her baseline.  Hemoglobin is decreased from prior visits.  CMP remarkable for some mild hyponatremia, creatinine function is within normal limits.  LFTs are unremarkable. Chest xray showed:  1. Chronic appearing increased lung markings with mild to moderate  severity bibasilar atelectasis and/or infiltrate, right greater than  left.  2. Interval mid thoracic spine fusion.     Patient also received a full program, steroids, will start her on antibiotics at this time as x-ray is questionable for infection, and she is hypoxic on arrival to the ED.  Palliative care consultation is currently pending.  6:14 PM patient is requesting pain medication for his chronic pain, given her home meds along with her potassium replacement.  She will be provided with IV antibiotics to treat likely CAP as this is causing her hypoxia.  I have discussed case with my attending Dr. Eulis Foster who agrees with plan and management.  6:30 PM Spoke to Dr. Jonelle Sidle, hospitalist who  will admit pt.   Portions of this note were generated with [employer]. Dictation errors may occur despite best attempts at proofreading.  Final Clinical Impression(s) / ED Diagnoses Final diagnoses:  Shortness of breath  Hypoxia  Community acquired pneumonia of right lower lobe of lung    Rx / DC Orders ED Discharge Orders    None       Janeece Fitting, PA-C 12/07/19 1830    Daleen Bo, MD 12/08/19 [phone]

## 2019-12-07 NOTE — ED Provider Notes (Signed)
  Face-to-face evaluation   History: She presents for shortness of breath associated with cough, occasional sputum production with his weight, and general malaise, for 2 weeks.  She has history of breast cancer and a secondary metastatic cancer.  Physical exam: Obese, alert and cooperative.  Oxygen saturation 90%, while on nasal cannula oxygen.  Lungs with scattered rhonchi and minimal wheezing.  Fair air movement bilaterally, somewhat diminished on the right as compared to the left.  Medical screening examination/treatment/procedure(s) were conducted as a shared visit with non-physician practitioner(s) and myself.  I personally evaluated the patient during the encounter    Daleen Bo, MD 12/08/19 [phone]

## 2019-12-07 NOTE — ED Triage Notes (Signed)
79 yo female BIBA from home. Pt has stage 4 metastatic lung cancer , presents c/o of SOB that was non resolving. EMS reports pt was satting in the high 80s on room air upon EMS arrival. PT is not on O2 at home. Pt was placed on 4L O2  via simple face mask and maintains sats around 93-94%. Pt denies fever, chest pain.  Vitals : 130/70 BP 100 HR 20 RR 94 on 4 L

## 2019-12-07 NOTE — ED Notes (Signed)
Removed pt from O2 to obtain baseline on room air. Pt maintained between 86-87% on room air. Pt was then placed on 4 L nasal cannula

## 2019-12-07 NOTE — ED Notes (Signed)
Radiology at pt bedside 

## 2019-12-07 NOTE — H&P (Signed)
History and Physical   Connie Lang QTM:[id number] DOB: 06-06-1941 DOA: 12/07/2019  Referring MD/NP/PA: Dr. Eulis Foster  PCP: Laurey Morale, MD   Outpatient Specialists: Dr. Kyung Rudd, radiation oncology  Patient coming from: Home  Chief Complaint: Shortness of breath and cough  HPI: Connie Lang is a 79 y.o. female with medical history significant of stage IV metastatic lung cancer on palliative radiotherapy, morbid obesity, GERD, osteoarthritis, degenerative disc disease, hypertension and hyperlipidemia who was brought in from home by EMS secondary to shortness of breath and hypoxia.  Her oxygen sat was apparently in the 80s when they arrived.  She was not on home O2.  Patient was placed on 4 L of oxygen as well as facemask.  When she arrived the ER her oxygen sats were in the 90s.  She reported 2 to 3 days of cough and fever.  Associated with sudden onset of shortness of breath.  Patient has been seeing Dr. Earlie Server as well as Dr. Lisbeth Renshaw for her treatment.  Ever since she was given 2 to 4 months to live from her metastatic disease she has been active.  Patient at this point has decided not to continue with the radiation or chemo.  She has opted for hospice care.  Not on any oxygen at home.  In the ER patient was reviewed and found to have what appears to be postobstructive pneumonia and she is being admitted to the hospital for treatment.  Patient still desires to be treated for reversible causes including the pneumonia..  ED Course: Temperature 98.5 blood pressure 114/70 pulse 103 respirate 26 oxygen sat 86% on room air currently 92% on 4 L.  Sodium 134 glucose 143 and the rest of the chemistry appeared to be within normal white count 11.8 hemoglobin 10.8.  COVID-19 screen is negative.  Chest x-ray showed chronic appearing lung markings with mild to modest bibasilar atelectasis or infiltrates right greater than left.  Patient being admitted for treatment.  Review of Systems: As per HPI  otherwise 10 point review of systems negative.    Past Medical History:  Diagnosis Date  . Allergy    occasionally has hayfever  . Anxiety   . Breast cancer ([place]) 2017  . Cancer ([place]) 1987   cancerous cells in vaginal wall seemed to be metastatic from the gut but no primary was ever found   . Cataract   . Chicken pox   . Depression   . DJD (degenerative joint disease)   . First degree AV block   . GERD (gastroesophageal reflux disease)   . Glaucoma    glaucoma and cataracts sees Dr Satira Sark   . Hyperlipemia   . Hypertension    pt denies currently. " Prednisone made it high."  . LBBB (left bundle branch block)   . Lumbar disc disease    sees Dr. Sherwood Gambler   . Macular degeneration   . Mobitz type 1 second degree atrioventricular block   . Osteoarthritis   . Osteopenia   . Personal history of radiation therapy   . PMR (polymyalgia rheumatica) ([facility])    sees Dr. Leigh Aurora   . Radiation    02-25-16 last radiation for breast cancer  . Transfusion history    blood     Past Surgical History:  Procedure Laterality Date  . ABDOMINAL HYSTERECTOMY     TAH BSO 1985  . APPENDECTOMY    . BASAL CELL CARCINOMA EXCISION     nose and rt thigh  .  BREAST BIOPSY      x 4  . BREAST EXCISIONAL BIOPSY Right 1997  . BREAST EXCISIONAL BIOPSY Left 1985  . BREAST EXCISIONAL BIOPSY Left 1965  . BREAST EXCISIONAL BIOPSY Right 1959  . BREAST LUMPECTOMY Right 12/25/2015  . BREAST LUMPECTOMY  02/27/2018   see report  . BREAST LUMPECTOMY WITH RADIOACTIVE SEED LOCALIZATION Right 12/25/2015   Procedure: RIGHT BREAST LUMPECTOMY WITH RADIOACTIVE SEED LOCALIZATION;  Surgeon: Rolm Bookbinder, MD;  Location: [facility];  Service: General;  Laterality: Right;  . CATARACT EXTRACTION     01-27-2011 left, 02-17-2011 right   . CERVICAL LAMINECTOMY     1983  . cervical microdiskectomy     1990 Dr Jovita Gamma  . CESAREAN SECTION    . COLONOSCOPY  03/21/2018   per Dr. Silverio Decamp,  adenomatous polyps, no repeats due to age   . EYE SURGERY     bil cataracts with lens implant  . FOOT ARTHRODESIS, MODIFIED MCBRIDE Right   . LUMBAR LAMINECTOMY     1990 Dr Jovita Gamma  . MYELOGRAM    . POLYPECTOMY    . REFRACTIVE SURGERY     01-29-2009 04-07-2009 Dr Foye Clock in [place]  . spinal injection     07-30-2012, 09-24-2012  . TENDON REPAIR Right    rt wrist  . TOTAL HIP ARTHROPLASTY Right 05/13/2016   Procedure: RIGHT TOTAL HIP ARTHROPLASTY ANTERIOR APPROACH;  Surgeon: Rod Can, MD;  Location: [facility];  Service: Orthopedics;  Laterality: Right;  N[place]ds RNFA  . TOTAL HIP ARTHROPLASTY Left 11/11/2016   Procedure: LEFT TOTAL HIP ARTHROPLASTY ANTERIOR APPROACH;  Surgeon: Rod Can, MD;  Location: [facility];  Service: Orthopedics;  Laterality: Left;  Dr. requesting RNFA  . [place]     x3  . WRIST SURGERY Right    tendon repair     reports that she quit smoking about 25 years ago. Her smoking use included cigarettes. She has a 18.00 pack-year smoking history. She has never used smokeless tobacco. She reports current alcohol use of about 7.0 standard drinks of alcohol per week. She reports that she does not use drugs.  No Known Allergies  Family History  Problem Relation Age of Onset  . Alzheimer's disease Father   . Heart failure Father        d. 40  . Stroke Mother 84  . Cancer Sister        ovarian  . Alzheimer's disease Paternal Aunt   . Breast cancer Paternal Aunt   . Colon cancer Brother        dx. 14s  . Stroke Maternal Aunt        d. 66s  . Heart disease Maternal Grandfather   . Diabetes Maternal Grandfather   . Stroke Paternal Grandmother 73  . Heart attack Paternal Grandfather        d. 85s  . Ovarian cancer Sister        dx. 53s; no genetic testing  . Breast cancer Daughter   . Leukemia Grandchild 21  . Skin cancer Grandchild   . Cervical cancer Cousin 40       maternal 1st cousin  . Alzheimer's disease Paternal  Aunt        (x4) paternal aunts  . Heart attack Paternal Uncle 72  . Colon polyps Neg Hx   . Rectal cancer Neg Hx   . Stomach cancer Neg Hx      Prior to Admission medications  Medication Sig Start Date End Date Taking? Authorizing Provider  acetaminophen (TYLENOL) 650 MG CR tablet Take 650-1,300 mg by mouth every 8 (eight) hours as needed for pain.    [provider]  amLODipine (NORVASC) 5 MG tablet TAKE 1 TABLET BY MOUTH ONCE DAILY IN THE EVENING 09/03/19   Laurey Morale, MD  anti-nausea (EMETROL) solution Take 10 mLs by mouth every 15 (fifteen) minutes as needed for nausea or vomiting.    [provider]  chlorpheniramine (CHLOR-TRIMETON) 4 MG tablet Take 4 mg by mouth daily as needed for allergies.    [provider]  cyclobenzaprine (FLEXERIL) 10 MG tablet Take 1 tablet by mouth three times daily as needed for muscle spasm 09/03/19   Laurey Morale, MD  diphenhydrAMINE (BENADRYL) 12.5 MG chewable tablet Chew 12.5 mg by mouth 2 (two) times daily as needed (for allergic reaction (lip swelling)).    [provider]  furosemide (LASIX) 40 MG tablet Take 1 tablet by mouth twice daily Patient taking differently: Take 40 mg by mouth 2 (two) times daily.  06/14/19   Laurey Morale, MD  gabapentin (NEURONTIN) 300 MG capsule Take 1 capsule (300 mg total) by mouth at bedtime. 09/10/19   Nicholas Lose, MD  guaifenesin (TUSSIN) 100 MG/5ML syrup Take 200 mg by mouth 3 (three) times daily as needed for cough.    [provider]  ibuprofen (ADVIL,MOTRIN) 200 MG tablet Take 400 mg by mouth every 8 (eight) hours as needed (for pain).     [provider]  losartan (COZAAR) 50 MG tablet Take 1 tablet by mouth once daily Patient taking differently: Take 50 mg by mouth every evening.  06/14/19   Laurey Morale, MD  Multiple Vitamins-Minerals (PRESERVISION AREDS 2 PO) Take 1 tablet by mouth 2 (two) times daily.    [provider]  NARCAN 4 MG/0.1ML  LIQD nasal spray kit CALL 911. ADMINISTER A SINGLE SPRAY OF NARCAN IN ONE NOSTRIL. REPEAT EVERY 3 MINUTES AS NEEDED IF NO OR MINIMAL RESPONSE. 09/10/19   [provider]  oxyCODONE (ROXICODONE) 5 MG immediate release tablet Take 1 tablet (5 mg total) by mouth every 6 (six) hours as needed for severe pain. 12/31/19 01/30/20  Laurey Morale, MD  oxymetazoline (AFRIN) 0.05 % nasal spray Place 1 spray into both nostrils at bedtime.     [provider]  pantoprazole (PROTONIX) 40 MG tablet Take 1 tablet by mouth once daily Patient taking differently: Take 40 mg by mouth daily.  06/14/19   Laurey Morale, MD  Polyethyl Glycol-Propyl Glycol (SYSTANE) 0.4-0.3 % SOLN Place 1 drop into both eyes 3 (three) times daily as needed (dry/irritated eyes.).    [provider]  potassium chloride (K-DUR) 10 MEQ tablet TAKE 2 TABLETS BY MOUTH ONCE DAILY Patient taking differently: Take 20 mEq by mouth daily in the afternoon. Mid afternoon 01/23/19   Laurey Morale, MD  temazepam (RESTORIL) 30 MG capsule Take 1 capsule (30 mg total) by mouth at bedtime as needed for sleep. 10/01/19   Laurey Morale, MD  timolol (TIMOPTIC) 0.5 % ophthalmic solution Place 1 drop into both eyes 2 (two) times daily. 06/15/19   [provider]  TRAVATAN Z 0.004 % SOLN ophthalmic solution Place 1 drop into both eyes at bedtime. 12/07/18   Laurey Morale, MD  vitamin B-12 (CYANOCOBALAMIN) 1000 MCG tablet Take 2,000 mcg by mouth daily at 3 pm.    [provider]  Calcium  Carbonate-Vit D-Min 1200-1000 MG-UNIT CHEW Chew 1 tablet by mouth daily.    09/16/11  [provider]    Physical Exam: Vitals:   12/07/19 2030 12/07/19 2200 12/07/19 2215 12/08/19 0033  BP: 117/75 125/75 118/67 121/74  Pulse: 99 (!) 102 (!) 102 (!) 103  Resp: (!) 26 (!) 21 (!) 26 16  Temp:      TempSrc:      SpO2: 91% 92% 90% 92%  Weight:      Height:          Constitutional: Morbidly obese, pleasant, no  distress Vitals:   12/07/19 2030 12/07/19 2200 12/07/19 2215 12/08/19 0033  BP: 117/75 125/75 118/67 121/74  Pulse: 99 (!) 102 (!) 102 (!) 103  Resp: (!) 26 (!) 21 (!) 26 16  Temp:      TempSrc:      SpO2: 91% 92% 90% 92%  Weight:      Height:       Eyes: PERRL, lids and conjunctivae normal ENMT: Mucous membranes are dry. Posterior pharynx clear of any exudate or lesions.Normal dentition.  Neck: normal, supple, no masses, no thyromegaly Respiratory: Coarse breath sound bilaterally with some decreased air entry, bilateral crackles or rhonchi normal respiratory effort. No accessory muscle use.  Cardiovascular: Sinus tachycardia, no murmurs / rubs / gallops. No extremity edema. 2+ pedal pulses. No carotid bruits.  Abdomen: no tenderness, no masses palpated. No hepatosplenomegaly. Bowel sounds positive.  Musculoskeletal: no clubbing / cyanosis. No joint deformity upper and lower extremities. Good ROM, no contractures. Normal muscle tone.  Skin: no rashes, lesions, ulcers. No induration Neurologic: CN 2-12 grossly intact. Sensation intact, DTR normal. Strength 5/5 in all 4.  Psychiatric: Normal judgment and insight. Alert and oriented x 3. Normal mood.     Labs on Admission: I have personally reviewed following labs and imaging studies  CBC: Recent Labs  Lab 12/07/19 1617 12/08/19 0055  WBC 11.8* 11.2*  NEUTROABS 9.6*  --   HGB 10.8* 11.0*  HCT 34.2* 33.8*  MCV 96.6 96.8  PLT 412* 174*   Basic Metabolic Panel: Recent Labs  Lab 12/07/19 1617  NA 134*  K 4.6  CL 98  CO2 24  GLUCOSE 143*  BUN 12  CREATININE 0.68  CALCIUM 8.6*   GFR: Estimated Creatinine Clearance: 58 mL/min (by C-G formula based on SCr of 0.68 mg/dL). Liver Function Tests: Recent Labs  Lab 12/07/19 1617  AST 21  ALT 17  ALKPHOS 231*  BILITOT 0.8  PROT 6.9  ALBUMIN 2.6*   No results for input(s): LIPASE, AMYLASE in the last 168 hours. No results for input(s): AMMONIA in the last 168  hours. Coagulation Profile: No results for input(s): INR, PROTIME in the last 168 hours. Cardiac Enzymes: No results for input(s): CKTOTAL, CKMB, CKMBINDEX, TROPONINI in the last 168 hours. BNP (last 3 results) No results for input(s): PROBNP in the last 8760 hours. HbA1C: No results for input(s): HGBA1C in the last 72 hours. CBG: No results for input(s): GLUCAP in the last 168 hours. Lipid Profile: No results for input(s): CHOL, HDL, LDLCALC, TRIG, CHOLHDL, LDLDIRECT in the last 72 hours. Thyroid Function Tests: No results for input(s): TSH, T4TOTAL, FREET4, T3FREE, THYROIDAB in the last 72 hours. Anemia Panel: No results for input(s): VITAMINB12, FOLATE, FERRITIN, TIBC, IRON, RETICCTPCT in the last 72 hours. Urine analysis:    Component Value Date/Time   COLORURINE AMBER (A) 06/13/2019 1040   APPEARANCEUR CLOUDY (A) 06/13/2019 1040   LABSPEC 1.018 06/13/2019  [place] 6.0 06/13/2019 1040   GLUCOSEU NEGATIVE 06/13/2019 1040   HGBUR NEGATIVE 06/13/2019 [place] 06/13/2019 1040   BILIRUBINUR negative 01/23/2019 1136   KETONESUR NEGATIVE 06/13/2019 1040   PROTEINUR 30 (A) 06/13/2019 1040   UROBILINOGEN 0.2 01/23/2019 1136   NITRITE NEGATIVE 06/13/2019 1040   LEUKOCYTESUR LARGE (A) 06/13/2019 1040   Sepsis Labs: _0 (procalcitonin:4,lacticidven:4) ) Recent Results (from the past 240 hour(s))  SARS Coronavirus 2 by RT PCR (hospital order, performed in [facility] hospital lab) Nasopharyngeal Nasopharyngeal Swab     Status: None   Collection Time: 12/07/19  4:17 PM   Specimen: Nasopharyngeal Swab  Result Value Ref Range Status   SARS Coronavirus 2 NEGATIVE NEGATIVE Final    Comment: (NOTE) SARS-CoV-2 target nucleic acids are NOT DETECTED. The SARS-CoV-2 RNA is generally detectable in upper and lower respiratory specimens during the acute phase of infection. The lowest concentration of SARS-CoV-2 viral copies this assay can detect is 250 copies  / mL. A negative result does not preclude SARS-CoV-2 infection and should not be used as the sole basis for treatment or other patient management decisions.  A negative result may occur with improper specimen collection / handling, submission of specimen other than nasopharyngeal swab, presence of viral mutation(s) within the areas targeted by this assay, and inadequate number of viral copies (<250 copies / mL). A negative result must be combined with clinical observations, patient history, and epidemiological information. Fact Sheet for Patients:   StrictlyIdeas.no Fact Sheet for Healthcare Providers: BankingDealers.co.za This test is not yet approved or cleared  by the [place] FDA and has been authorized for detection and/or diagnosis of SARS-CoV-2 by FDA under an Emergency Use Authorization (EUA).  This EUA will remain in effect (meaning this test can be used) for the duration of the COVID-19 declaration under Section 564(b)(1) of the Act, 21 U.S.C. section 360bbb-3(b)(1), unless the authorization is terminated or revoked sooner. Performed at [facility], [place] [address], [place], [place] [address]      Radiological Exams on Admission: DG Chest Portable 1 View  Result Date: 12/07/2019 CLINICAL DATA:  Shortness of breath. EXAM: PORTABLE CHEST 1 VIEW COMPARISON:  June 13, 2019 FINDINGS: Mild diffuse chronic appearing increased lung markings are seen. Mild to moderate severity areas of atelectasis and/or infiltrate are seen within the bilateral lung bases, right greater than left. There is no evidence of a pleural effusion or pneumothorax. The cardiac silhouette is moderately enlarged. There is marked severity calcification of the aortic arch. Bilateral pedicle screws are seen within the midthoracic spine. This represents a new finding when compared to the prior exam. IMPRESSION: 1. Chronic appearing increased lung  markings with mild to moderate severity bibasilar atelectasis and/or infiltrate, right greater than left. 2. Interval mid thoracic spine fusion. Electronically Signed   By: Virgina Norfolk M.D.   On: 12/07/2019 16:54    EKG: Independently reviewed.  Sinus tachycardia no other findings  Assessment/Plan Principal Problem:   CAP (community acquired pneumonia) Active Problems:   Hyperlipemia, mixed   Essential hypertension   Obesity, unspecified   Breast cancer of upper-outer quadrant of right female breast ([place])   Spine metastasis ([place])   Stage IV adenocarcinoma of lung ([place])     #1 community-acquired pneumonia versus postobstructive pneumonia: Patient will be admitted.  Will be treated with IV antibiotics including Rocephin and Zithromax.  Oxygenation.  Supportive care.  If no improvement to antibiotics this could be advancement of  her lung cancer.  #2 stage IV lung cancer: Palliation only at this point.  Patient has declined radiation and chemotherapy.  Hospice already involved in patient's care.  Continue supportive care.  May consult hospice palliative care while in the hospital  #3 morbid obesity: Patient currently is terminal and heart disease.  #4 history of breast cancer: Again continue per oncology.  #5 hypertension: Confirm and resume home regimen.  #6 hyperlipidemia: Confirm on resume home regimen.   DVT prophylaxis: Lovenox Code Status: Full code Family Communication: Husband at bedside Disposition Plan: To be determined Consults called: None Admission status: Inpatient  Severity of Illness: The appropriate patient status for this patient is INPATIENT. Inpatient status is judged to be reasonable and necessary in order to provide the required intensity of service to ensure the patient's safety. The patient's presenting symptoms, physical exam findings, and initial radiographic and laboratory data in the context of their chronic comorbidities is felt to place them at  high risk for further clinical deterioration. Furthermore, it is not anticipated that the patient will be medically stable for discharge from the hospital within 2 midnights of admission. The following factors support the patient status of inpatient.   " The patient's presenting symptoms include shortness of breath and cough. " The worrisome physical exam findings include morbidly obese with crackles. " The initial radiographic and laboratory data are worrisome because of evidence of pneumonia bilaterally. " The chronic co-morbidities include stage IV lung cancer.   * I certify that at the point of admission it is my clinical judgment that the patient will require inpatient hospital care spanning beyond 2 midnights from the point of admission due to high intensity of service, high risk for further deterioration and high frequency of surveillance required.Barbette Merino MD [facility] Pager [phone]  If 7PM-7AM, please contact night-coverage [email or web address] Password [email or web address]  12/08/2019, 1:28 AM

## 2019-12-07 NOTE — ED Notes (Signed)
Pt provided with Mouth swab stick and water for dry mouth. Pa advised PO liquids/food after medications.

## 2019-12-08 ENCOUNTER — Encounter (HOSPITAL_COMMUNITY): Payer: Self-pay | Admitting: Internal Medicine

## 2019-12-08 DIAGNOSIS — C7951 Secondary malignant neoplasm of bone: Secondary | ICD-10-CM

## 2019-12-08 DIAGNOSIS — C3492 Malignant neoplasm of unspecified part of left bronchus or lung: Secondary | ICD-10-CM

## 2019-12-08 LAB — COMPREHENSIVE METABOLIC PANEL WITH GFR
ALT: 17 U/L (ref 0–44)
AST: 15 U/L (ref 15–41)
Albumin: 2.5 g/dL — ABNORMAL LOW (ref 3.5–5.0)
Alkaline Phosphatase: 209 U/L — ABNORMAL HIGH (ref 38–126)
Anion gap: 11 (ref 5–15)
BUN: 12 mg/dL (ref 8–23)
CO2: 21 mmol/L — ABNORMAL LOW (ref 22–32)
Calcium: 8.6 mg/dL — ABNORMAL LOW (ref 8.9–10.3)
Chloride: 101 mmol/L (ref 98–111)
Creatinine, Ser: 0.59 mg/dL (ref 0.44–1.00)
GFR calc Af Amer: >60 mL/min
GFR calc non Af Amer: >60 mL/min
Glucose, Bld: 166 mg/dL — ABNORMAL HIGH (ref 70–99)
Potassium: 4.6 mmol/L (ref 3.5–5.1)
Sodium: 133 mmol/L — ABNORMAL LOW (ref 135–145)
Total Bilirubin: 0.6 mg/dL (ref 0.3–1.2)
Total Protein: 6.7 g/dL (ref 6.5–8.1)

## 2019-12-08 LAB — CREATININE, SERUM
Creatinine, Ser: 0.57 mg/dL (ref 0.44–1.00)
GFR calc Af Amer: >60 mL/min
GFR calc non Af Amer: >60 mL/min

## 2019-12-08 LAB — CBC
HCT: 33.8 % — ABNORMAL LOW (ref 36.0–46.0)
HCT: 32.8 % — ABNORMAL LOW (ref 36.0–46.0)
Hemoglobin: 11 g/dL — ABNORMAL LOW (ref 12.0–15.0)
Hemoglobin: 10.5 g/dL — ABNORMAL LOW (ref 12.0–15.0)
MCH: 31.5 pg (ref 26.0–34.0)
MCH: 31 pg (ref 26.0–34.0)
MCHC: 32.5 g/dL (ref 30.0–36.0)
MCHC: 32 g/dL (ref 30.0–36.0)
MCV: 96.8 fL (ref 80.0–100.0)
MCV: 96.8 fL (ref 80.0–100.0)
Platelets: 422 10*3/uL — ABNORMAL HIGH (ref 150–400)
Platelets: 431 K/uL — ABNORMAL HIGH (ref 150–400)
RBC: 3.49 MIL/uL — ABNORMAL LOW (ref 3.87–5.11)
RBC: 3.39 MIL/uL — ABNORMAL LOW (ref 3.87–5.11)
RDW: 15 % (ref 11.5–15.5)
RDW: 15 % (ref 11.5–15.5)
WBC: 11.2 10*3/uL — ABNORMAL HIGH (ref 4.0–10.5)
WBC: 10.3 K/uL (ref 4.0–10.5)
nRBC: 0 % (ref 0.0–0.2)
nRBC: 0 % (ref 0.0–0.2)

## 2019-12-08 LAB — HIV ANTIBODY (ROUTINE TESTING W REFLEX): HIV Screen 4th Generation wRfx: NONREACTIVE

## 2019-12-08 LAB — STREP PNEUMONIAE URINARY ANTIGEN: Strep Pneumo Urinary Antigen: NEGATIVE

## 2019-12-08 MED ORDER — IBUPROFEN 200 MG PO TABS: 400.0000 mg | ORAL_TABLET | Freq: Three times a day (TID) | ORAL | Status: DC | PRN

## 2019-12-08 MED ORDER — POLYVINYL ALCOHOL 1.4 % OP SOLN: 1.0000 [drp] | Freq: Three times a day (TID) | OPHTHALMIC | Status: DC | PRN

## 2019-12-08 MED ORDER — VITAMIN B-12 1000 MCG PO TABS
2000.0000 ug | ORAL_TABLET | Freq: Every day | ORAL | Status: DC
  Administered 2019-12-08 – 2019-12-12 (×5): 2000 ug via ORAL
  Filled 2019-12-08 (×5): qty 2

## 2019-12-08 MED ORDER — GABAPENTIN 300 MG PO CAPS
300.0000 mg | ORAL_CAPSULE | Freq: Every day | ORAL | Status: DC
  Administered 2019-12-08 – 2019-12-12 (×5): 300 mg via ORAL
  Filled 2019-12-08 (×5): qty 1

## 2019-12-08 MED ORDER — PROSIGHT PO TABS
1.0000 | ORAL_TABLET | Freq: Two times a day (BID) | ORAL | Status: DC
  Administered 2019-12-08 – 2019-12-09 (×2): 1 via ORAL
  Filled 2019-12-08 (×2): qty 1

## 2019-12-08 MED ORDER — CYCLOBENZAPRINE HCL 10 MG PO TABS
10.0000 mg | ORAL_TABLET | Freq: Three times a day (TID) | ORAL | Status: DC | PRN
  Administered 2019-12-08 – 2019-12-12 (×3): 10 mg via ORAL
  Filled 2019-12-08 (×3): qty 1

## 2019-12-08 MED ORDER — LATANOPROST 0.005 % OP SOLN
1.0000 [drp] | Freq: Every day | OPHTHALMIC | Status: DC
  Administered 2019-12-08 – 2019-12-12 (×5): 1 [drp] via OPHTHALMIC
  Filled 2019-12-08: qty 2.5

## 2019-12-08 MED ORDER — DIPHENHYDRAMINE HCL 12.5 MG/5ML PO ELIX: 12.5000 mg | ORAL_SOLUTION | Freq: Two times a day (BID) | ORAL | Status: DC | PRN

## 2019-12-08 MED ORDER — IPRATROPIUM BROMIDE 0.02 % IN SOLN
0.5000 mg | Freq: Three times a day (TID) | RESPIRATORY_TRACT | Status: DC
  Administered 2019-12-08 – 2019-12-13 (×16): 0.5 mg via RESPIRATORY_TRACT
  Filled 2019-12-08 (×15): qty 2.5

## 2019-12-08 MED ORDER — OXYMETAZOLINE HCL 0.05 % NA SOLN: 1.0000 | Freq: Every day | NASAL | Status: DC

## 2019-12-08 MED ORDER — GUAIFENESIN 100 MG/5ML PO SOLN
200.0000 mg | Freq: Three times a day (TID) | ORAL | Status: DC | PRN
  Filled 2019-12-08: qty 10

## 2019-12-08 MED ORDER — LOSARTAN POTASSIUM 50 MG PO TABS
50.0000 mg | ORAL_TABLET | Freq: Every day | ORAL | Status: DC
  Administered 2019-12-08 – 2019-12-13 (×6): 50 mg via ORAL
  Filled 2019-12-08 (×6): qty 1

## 2019-12-08 MED ORDER — TEMAZEPAM 15 MG PO CAPS
30.0000 mg | ORAL_CAPSULE | Freq: Every evening | ORAL | Status: DC | PRN
  Administered 2019-12-08: 30 mg via ORAL
  Filled 2019-12-08: qty 2

## 2019-12-08 MED ORDER — AMLODIPINE BESYLATE 5 MG PO TABS
5.0000 mg | ORAL_TABLET | Freq: Every day | ORAL | Status: DC
  Administered 2019-12-08 – 2019-12-13 (×6): 5 mg via ORAL
  Filled 2019-12-08 (×6): qty 1

## 2019-12-08 MED ORDER — ACETAMINOPHEN 325 MG PO TABS
650.0000 mg | ORAL_TABLET | Freq: Three times a day (TID) | ORAL | Status: DC | PRN
  Administered 2019-12-08 – 2019-12-09 (×2): 650 mg via ORAL
  Administered 2019-12-09: 1300 mg via ORAL
  Administered 2019-12-10: 650 mg via ORAL
  Filled 2019-12-08: qty 2
  Filled 2019-12-08 (×3): qty 4

## 2019-12-08 MED ORDER — LEVALBUTEROL HCL 0.63 MG/3ML IN NEBU
0.6300 mg | INHALATION_SOLUTION | Freq: Three times a day (TID) | RESPIRATORY_TRACT | Status: DC
  Administered 2019-12-08 (×2): 0.63 mg via RESPIRATORY_TRACT
  Filled 2019-12-08: qty 3

## 2019-12-08 MED ORDER — PROSIGHT PO TABS
ORAL_TABLET | Freq: Two times a day (BID) | ORAL | Status: DC
  Filled 2019-12-08: qty 1

## 2019-12-08 MED ORDER — PANTOPRAZOLE SODIUM 40 MG PO TBEC
40.0000 mg | DELAYED_RELEASE_TABLET | Freq: Every day | ORAL | Status: DC
  Administered 2019-12-08 – 2019-12-13 (×6): 40 mg via ORAL
  Filled 2019-12-08 (×6): qty 1

## 2019-12-08 MED ORDER — TIMOLOL MALEATE 0.5 % OP SOLN
1.0000 [drp] | Freq: Two times a day (BID) | OPHTHALMIC | Status: DC
  Administered 2019-12-08 – 2019-12-13 (×10): 1 [drp] via OPHTHALMIC
  Filled 2019-12-08: qty 5

## 2019-12-08 MED ORDER — LEVALBUTEROL HCL 0.63 MG/3ML IN NEBU: 0.6300 mg | INHALATION_SOLUTION | Freq: Four times a day (QID) | RESPIRATORY_TRACT | Status: DC | PRN

## 2019-12-08 NOTE — Progress Notes (Signed)
Report received from ED 

## 2019-12-08 NOTE — Progress Notes (Signed)
PROGRESS NOTE    Connie Lang  KGU:[id number] DOB: 1941-04-25 DOA: 12/07/2019 PCP: Laurey Morale, MD    Brief Narrative:  Patient admitted to the hospital with a working diagnosis of acute hypoxic respiratory failure due to postobstructive pneumonia in the setting of stage IV lung cancer.  79 year old female who presented with dyspnea and cough.  She does have significant past medical history for stage IV metastatic lung cancer, on palliative radiotherapy, she has GERD, osteoarthritis, hypertension, dyslipidemia and obesity class III.  Patient has advanced malignancy, she is not under hospice care yet, but she has decided to stop cancer therapy and follow palliative care.  She reported 2 to 3 days of cough and fever, associated with rapid progressive worsening dyspnea.  EMS was called and she was found hypoxic, down to 80%. On her initial physical examination her temperature was 98.5, blood pressure 114/70, heart rate 103, respiratory 26, oxygen saturation 86% on room air, 93% on 4 L per nasal cannula. She had coarse breath sounds bilaterally with decreased air movement, bilateral rhonchi and rails.  Heart S1-S2, present tachycardic, soft abdomen, no lower extremity edema. Sodium 134, potassium 4.6, chloride 98, bicarb 24, glucose 143, BUN 12, creatinine 0.68, white count 11.8, hemoglobin 10.8, hematocrit 34.2, platelets 412.  SARS COVID-19 negative.  Chest radiograph with dense right lower lobe infiltrate. EKG 90 bpm, left axis deviation, first degree AV block, left bundle branch block, manually corrected QTc 487, sinus rhythm, J-point elevation in V2 through V5, no significant T wave changes.  Assessment & Plan:   Principal Problem:   CAP (community acquired pneumonia) Active Problems:   Hyperlipemia, mixed   Essential hypertension   Obesity, unspecified   Breast cancer of upper-outer quadrant of right female breast ([place])   Spine metastasis ([place])   Stage IV adenocarcinoma of lung  ([place])   1. Acute hypoxic respiratory failure due to postobstructive right lower lobe pneumonia, present on admission. Patient continue to have dyspnea, her oxygenation is 90 to 91% on 4 L/ min per [place]. Wbc is down to 10,3 and blood culture is still pending result.   Patient with advances malignancy poor prognosis. Will continue antibiotic therapy with ceftriaxone, add airway clearing techniques with flutter valve and incentive spirometry. As needed bronchodilator therapy and antitussive agents.   2. Stage IV lung adenocarcinoma. Patient not longer getting radiation therapy. She follows with Dr. Julien Nordmann in the cancer center.   We talk about her condition, postobstructive pneumonia in the setting of advance pulmonary malignancy, we talk about her goals of care. Her husband at the bedside also participated in the conversation. At home her husband manages her medications and he had to give her naloxone about 4 times already, related to opiate toxicity. She has been dealing with back pain and not able to sleep at night at home.    I think she will benefit from hospice services, for symptom control, her life expectancy likely less than 6 months.   They agree in consulting palliative care team for further recommendations, while hospitalized.  3. HTN. Will hold on IV fluids for now to prevent volume overload, will resume amlodipine and losartan for blood pressure control.   Hold on furosemide for now.   4. Dyslipidemia with class 3 obesity. Patient off statin at this point.   5. Chronic back pain. Resume pain regimen with acetaminophen, ibuprofen, cyclobenzaprine, gabapentin and oxycodone.   6. Insomnia. Will resume temazepam at night with close monitoring.     Status is:  Inpatient  Remains inpatient appropriate because:IV treatments appropriate due to intensity of illness or inability to take PO   Dispo: The patient is from: Home              Anticipated d/c is to: Home               Anticipated d/c date is: 2 days              Patient currently is not medically stable to d/c.   DVT prophylaxis: Enoxaparin   Code Status:   full  Family Communication:  I spoke with patient's husband at the bedside, we talked in detail about patient's condition, plan of care and prognosis and all questions were addressed.     Consultants:   Palliative care  Antimicrobials:   Ceftriaxone IV    Subjective: Patient continue to have dyspnea at rest, worse with exertion, no nausea or vomiting, no chest pain. At home with significant physical limitation.   Objective: Vitals:   12/08/19 1145 12/08/19 1230 12/08/19 1315 12/08/19 1359  BP:  (!) 158/92  (!) 141/78  Pulse: (!) 105 (!) 103 (!) 104 (!) 103  Resp: (!) 22 (!) 25 17 15   Temp:    98.1 F (36.7 C)  TempSrc:    Oral  SpO2: 90% 90% 90% 91%  Weight:    80.3 kg  Height:    5\' 4"  (1.626 m)    Intake/Output Summary (Last 24 hours) at 12/08/2019 1436 Last data filed at 12/08/2019 1100 Gross per 24 hour  Intake 1454.99 ml  Output 600 ml  Net 854.99 ml   Filed Weights   12/07/19 1602 12/08/19 1359  Weight: 81.6 kg 80.3 kg    Examination:   General: Not in pain or dyspnea,. Deconditioned  Neurology: Awake and alert, non focal  E ENT: no pallor, no icterus, oral mucosa moist Cardiovascular: No JVD. S1-S2 present, rhythmic, no gallops, rubs, or murmurs. Trace lower extremity edema. Pulmonary: positive breath sounds bilaterally,decreased breath sounds at the right base with no wheezing, rhonchi or rales. Gastrointestinal. Abdomen protuberant with, no organomegaly, non tender, no rebound or guarding Skin. No rashes Musculoskeletal: no joint deformities     Data Reviewed: I have personally reviewed following labs and imaging studies  CBC: Recent Labs  Lab 12/07/19 1617 12/08/19 0055 12/08/19 0446  WBC 11.8* 11.2* 10.3  NEUTROABS 9.6*  --   --   HGB 10.8* 11.0* 10.5*  HCT 34.2* 33.8* 32.8*  MCV 96.6 96.8 96.8    PLT 412* 422* 676*   Basic Metabolic Panel: Recent Labs  Lab 12/07/19 1617 12/08/19 0055 12/08/19 0446  NA 134*  --  133*  K 4.6  --  4.6  CL 98  --  101  CO2 24  --  21*  GLUCOSE 143*  --  166*  BUN 12  --  12  CREATININE 0.68 0.57 0.59  CALCIUM 8.6*  --  8.6*   GFR: Estimated Creatinine Clearance: 57.5 mL/min (by C-G formula based on SCr of 0.59 mg/dL). Liver Function Tests: Recent Labs  Lab 12/07/19 1617 12/08/19 0446  AST 21 15  ALT 17 17  ALKPHOS 231* 209*  BILITOT 0.8 0.6  PROT 6.9 6.7  ALBUMIN 2.6* 2.5*   No results for input(s): LIPASE, AMYLASE in the last 168 hours. No results for input(s): AMMONIA in the last 168 hours. Coagulation Profile: No results for input(s): INR, PROTIME in the last 168 hours. Cardiac Enzymes: No results for  input(s): CKTOTAL, CKMB, CKMBINDEX, TROPONINI in the last 168 hours. BNP (last 3 results) No results for input(s): PROBNP in the last 8760 hours. HbA1C: No results for input(s): HGBA1C in the last 72 hours. CBG: No results for input(s): GLUCAP in the last 168 hours. Lipid Profile: No results for input(s): CHOL, HDL, LDLCALC, TRIG, CHOLHDL, LDLDIRECT in the last 72 hours. Thyroid Function Tests: No results for input(s): TSH, T4TOTAL, FREET4, T3FREE, THYROIDAB in the last 72 hours. Anemia Panel: No results for input(s): VITAMINB12, FOLATE, FERRITIN, TIBC, IRON, RETICCTPCT in the last 72 hours.    Radiology Studies: I have reviewed all of the imaging during this hospital visit personally     Scheduled Meds: . enoxaparin (LOVENOX) injection  40 mg Subcutaneous QHS  . potassium chloride  20 mEq Oral Q1500   Continuous Infusions: . sodium chloride 20 mL/hr at 12/08/19 1020  . azithromycin    . cefTRIAXone (ROCEPHIN)  IV Stopped (12/08/19 4599)     LOS: 1 day        Berdia Lachman Gerome Apley, MD

## 2019-12-09 DIAGNOSIS — Z66 Do not resuscitate: Secondary | ICD-10-CM | POA: Diagnosis present

## 2019-12-09 LAB — CBC WITH DIFFERENTIAL/PLATELET
Abs Immature Granulocytes: 0.15 10*3/uL — ABNORMAL HIGH (ref 0.00–0.07)
Basophils Absolute: 0 10*3/uL (ref 0.0–0.1)
Basophils Relative: 0 %
Eosinophils Absolute: 0 10*3/uL (ref 0.0–0.5)
Eosinophils Relative: 0 %
HCT: 30.9 % — ABNORMAL LOW (ref 36.0–46.0)
Hemoglobin: 9.8 g/dL — ABNORMAL LOW (ref 12.0–15.0)
Immature Granulocytes: 1 %
Lymphocytes Relative: 5 %
Lymphs Abs: 0.7 10*3/uL (ref 0.7–4.0)
MCH: 30.7 pg (ref 26.0–34.0)
MCHC: 31.7 g/dL (ref 30.0–36.0)
MCV: 96.9 fL (ref 80.0–100.0)
Monocytes Absolute: 1.4 10*3/uL — ABNORMAL HIGH (ref 0.1–1.0)
Monocytes Relative: 10 %
Neutro Abs: 11.6 10*3/uL — ABNORMAL HIGH (ref 1.7–7.7)
Neutrophils Relative %: 84 %
Platelets: 417 10*3/uL — ABNORMAL HIGH (ref 150–400)
RBC: 3.19 MIL/uL — ABNORMAL LOW (ref 3.87–5.11)
RDW: 15.3 % (ref 11.5–15.5)
WBC: 13.9 10*3/uL — ABNORMAL HIGH (ref 4.0–10.5)
nRBC: 0 % (ref 0.0–0.2)

## 2019-12-09 LAB — BASIC METABOLIC PANEL
Anion gap: 11 (ref 5–15)
BUN: 12 mg/dL (ref 8–23)
CO2: 21 mmol/L — ABNORMAL LOW (ref 22–32)
Calcium: 8.9 mg/dL (ref 8.9–10.3)
Chloride: 106 mmol/L (ref 98–111)
Creatinine, Ser: 0.45 mg/dL (ref 0.44–1.00)
GFR calc Af Amer: >60 mL/min (ref >60)
GFR calc non Af Amer: >60 mL/min (ref >60)
Glucose, Bld: 119 mg/dL — ABNORMAL HIGH (ref 70–99)
Potassium: 4.4 mmol/L (ref 3.5–5.1)
Sodium: 138 mmol/L (ref 135–145)

## 2019-12-09 MED ORDER — MENTHOL 3 MG MT LOZG
1.0000 | LOZENGE | OROMUCOSAL | Status: DC | PRN
  Filled 2019-12-09: qty 9

## 2019-12-09 MED ORDER — MAGNESIUM HYDROXIDE 400 MG/5ML PO SUSP
15.0000 mL | Freq: Every day | ORAL | Status: AC
  Administered 2019-12-09 – 2019-12-10 (×2): 15 mL via ORAL
  Filled 2019-12-09 (×2): qty 30

## 2019-12-09 MED ORDER — POTASSIUM CHLORIDE CRYS ER 10 MEQ PO TBCR
10.0000 meq | EXTENDED_RELEASE_TABLET | Freq: Every day | ORAL | Status: AC
  Administered 2019-12-09 – 2019-12-10 (×2): 10 meq via ORAL
  Filled 2019-12-09 (×2): qty 1

## 2019-12-09 MED ORDER — LEVALBUTEROL HCL 0.63 MG/3ML IN NEBU
0.6300 mg | INHALATION_SOLUTION | Freq: Three times a day (TID) | RESPIRATORY_TRACT | Status: DC
  Administered 2019-12-09 – 2019-12-13 (×14): 0.63 mg via RESPIRATORY_TRACT
  Filled 2019-12-09 (×14): qty 3

## 2019-12-09 MED ORDER — FUROSEMIDE 40 MG PO TABS
40.0000 mg | ORAL_TABLET | Freq: Every day | ORAL | Status: DC
  Administered 2019-12-09 – 2019-12-10 (×2): 40 mg via ORAL
  Filled 2019-12-09 (×2): qty 1

## 2019-12-09 MED ORDER — OXYCODONE HCL 5 MG PO TABS
5.0000 mg | ORAL_TABLET | Freq: Four times a day (QID) | ORAL | Status: DC | PRN
  Administered 2019-12-09 (×3): 10 mg via ORAL
  Filled 2019-12-09 (×3): qty 2

## 2019-12-09 MED ORDER — MORPHINE SULFATE (PF) 2 MG/ML IV SOLN
1.0000 mg | INTRAVENOUS | Status: DC | PRN
  Administered 2019-12-09 – 2019-12-13 (×8): 1 mg via INTRAVENOUS
  Filled 2019-12-09 (×8): qty 1

## 2019-12-09 NOTE — Progress Notes (Signed)
PROGRESS NOTE    Connie Lang  YJE:[id number] DOB: 1940/12/13 DOA: 12/07/2019 PCP: Laurey Morale, MD    Brief Narrative:  Patient admitted to the hospital with a working diagnosis of acute hypoxic respiratory failure due to postobstructive pneumonia in the setting of stage IV lung cancer.  79 year old female who presented with dyspnea and cough.  She does have significant past medical history for stage IV metastatic lung cancer, on palliative radiotherapy, she has GERD, osteoarthritis, hypertension, dyslipidemia and obesity class III.  Patient has advanced malignancy, she is not under hospice care yet, but she has decided to stop cancer therapy and follow palliative care.  She reported 2 to 3 days of cough and fever, associated with rapid progressive worsening dyspnea.  EMS was called and she was found hypoxic, down to 80%. On her initial physical examination her temperature was 98.5, blood pressure 114/70, heart rate 103, respiratory 26, oxygen saturation 86% on room air, 93% on 4 L per nasal cannula. She had coarse breath sounds bilaterally with decreased air movement, bilateral rhonchi and rails.  Heart S1-S2, present tachycardic, soft abdomen, no lower extremity edema. Sodium 134, potassium 4.6, chloride 98, bicarb 24, glucose 143, BUN 12, creatinine 0.68, white count 11.8, hemoglobin 10.8, hematocrit 34.2, platelets 412.  SARS COVID-19 negative.  Chest radiograph with dense right lower lobe infiltrate. EKG 90 bpm, left axis deviation, first degree AV block, left bundle branch block, manually corrected QTc 487, sinus rhythm, J-point elevation in V2 through V5, no significant T wave changes.  Patient has been placed on supplemental oxygen and antibiotic therapy. She continue to have significant dyspnea with minimal efforts, Consulted palliative care and plan is to go home with hospice once dyspnea more stable.    Assessment & Plan:   Principal Problem:   CAP (community acquired  pneumonia) Active Problems:   Hyperlipemia, mixed   Essential hypertension   Obesity, unspecified   Breast cancer of upper-outer quadrant of right female breast ([place])   Spine metastasis ([place])   Stage IV adenocarcinoma of lung ([place])   DNR (do not resuscitate)  1. Acute hypoxic respiratory failure due to postobstructive right lower lobe pneumonia, present on admission. Patient continue to have severe episodic dyspnea with minimal efforts, continue to have elevated oxygen requirements with oxygenation 90% on 4 L/ min per [place]. Worsening leukocytosis with wbc up to 13,9.   Plan to continue antibiotic therapy with ceftriaxone,  airway clearing techniques with flutter valve and incentive spirometry, plus as needed bronchodilator therapy and antitussive agents.   Morphine and oxycodone for palliative dyspnea in case of severe symptoms.   2. Stage IV lung adenocarcinoma. Patient not longer getting radiation therapy. She follows with Dr. Julien Nordmann in the cancer center.   Advance malignancy, patient has declined further treatment, palliative care has been consulted and patient will be discharge home with hospice services when her dyspnea better controlled. CODE status has been changed to DNR.   Will add the oncology team to the care team.   3. HTN. Continue blood pressure control with amlodipine and losartan. Will resume furosemide to prevent volume overload.   4. Dyslipidemia with class 3 obesity. Continue holding statin.  5. Chronic back pain. Continue pain control with acetaminophen, ibuprofen, cyclobenzaprine, gabapentin and oxycodone.   6. Insomnia. Patient not tolerating temazepam well, medication has been discontinued.    Status is: Inpatient  Remains inpatient appropriate because:Unsafe d/c plan and Inpatient level of care appropriate due to severity of illness   Dispo:  The patient is from: Home              Anticipated d/c is to: Home              Anticipated d/c date is: 2  days              Patient currently is not medically stable to d/c.    DVT prophylaxis: Enoxaparin   Code Status:   DNR  Family Communication: I spoke with patient's husband  at the bedside, we talked in detail about patient's condition, plan of care and prognosis and all questions were addressed.      Antibiotic therapy; Ceftriaxone    Subjective: Patient continue have episodes of severe dyspnea with minimal efforts, no nausea or vomiting. Continue to have back pain and limited mobility.   Objective: Vitals:   12/09/19 0813 12/09/19 1029 12/09/19 1336 12/09/19 1338  BP:  121/65  (!) 104/59  Pulse:   (!) 103 (!) 103  Resp:   (!) 22 16  Temp:    98.6 F (37 C)  TempSrc:    Oral  SpO2: 92%  90% 90%  Weight:      Height:        Intake/Output Summary (Last 24 hours) at 12/09/2019 1611 Last data filed at 12/09/2019 1230 Gross per 24 hour  Intake 220 ml  Output 625 ml  Net -405 ml   Filed Weights   12/07/19 1602 12/08/19 1359  Weight: 81.6 kg 80.3 kg    Examination:   General: deconditioned and ill looking appearing, positive dyspnea at rest.  Neurology: Awake and alert, non focal  E ENT: no pallor, no icterus, oral mucosa moist Cardiovascular: No JVD. S1-S2 present, rhythmic, no gallops, rubs, or murmurs. No lower extremity edema. Pulmonary: positive breath sounds bilaterally, decreased air movement, scattered rhonchi, wheezing and rales, bitlaterally Gastrointestinal. Abdomen with, no organomegaly, non tender, no rebound or guarding Skin. No rashes Musculoskeletal: no joint deformities     Data Reviewed: I have personally reviewed following labs and imaging studies  CBC: Recent Labs  Lab 12/07/19 1617 12/08/19 0055 12/08/19 0446 12/09/19 0540  WBC 11.8* 11.2* 10.3 13.9*  NEUTROABS 9.6*  --   --  11.6*  HGB 10.8* 11.0* 10.5* 9.8*  HCT 34.2* 33.8* 32.8* 30.9*  MCV 96.6 96.8 96.8 96.9  PLT 412* 422* 431* 144*   Basic Metabolic Panel: Recent Labs  Lab  12/07/19 1617 12/08/19 0055 12/08/19 0446 12/09/19 0540  NA 134*  --  133* 138  K 4.6  --  4.6 4.4  CL 98  --  101 106  CO2 24  --  21* 21*  GLUCOSE 143*  --  166* 119*  BUN 12  --  12 12  CREATININE 0.68 0.57 0.59 0.45  CALCIUM 8.6*  --  8.6* 8.9   GFR: Estimated Creatinine Clearance: 57.5 mL/min (by C-G formula based on SCr of 0.45 mg/dL). Liver Function Tests: Recent Labs  Lab 12/07/19 1617 12/08/19 0446  AST 21 15  ALT 17 17  ALKPHOS 231* 209*  BILITOT 0.8 0.6  PROT 6.9 6.7  ALBUMIN 2.6* 2.5*   No results for input(s): LIPASE, AMYLASE in the last 168 hours. No results for input(s): AMMONIA in the last 168 hours. Coagulation Profile: No results for input(s): INR, PROTIME in the last 168 hours. Cardiac Enzymes: No results for input(s): CKTOTAL, CKMB, CKMBINDEX, TROPONINI in the last 168 hours. BNP (last 3 results) No results for input(s): PROBNP in  the last 8760 hours. HbA1C: No results for input(s): HGBA1C in the last 72 hours. CBG: No results for input(s): GLUCAP in the last 168 hours. Lipid Profile: No results for input(s): CHOL, HDL, LDLCALC, TRIG, CHOLHDL, LDLDIRECT in the last 72 hours. Thyroid Function Tests: No results for input(s): TSH, T4TOTAL, FREET4, T3FREE, THYROIDAB in the last 72 hours. Anemia Panel: No results for input(s): VITAMINB12, FOLATE, FERRITIN, TIBC, IRON, RETICCTPCT in the last 72 hours.    Radiology Studies: I have reviewed all of the imaging during this hospital visit personally     Scheduled Meds: . amLODipine  5 mg Oral Daily  . enoxaparin (LOVENOX) injection  40 mg Subcutaneous QHS  . gabapentin  300 mg Oral QHS  . ipratropium  0.5 mg Nebulization TID  . latanoprost  1 drop Both Eyes QHS  . levalbuterol  0.63 mg Nebulization TID  . losartan  50 mg Oral Daily  . magnesium hydroxide  15 mL Oral QHS  . pantoprazole  40 mg Oral Daily  . timolol  1 drop Both Eyes BID  . vitamin B-12  2,000 mcg Oral Q1500   Continuous  Infusions: . cefTRIAXone (ROCEPHIN)  IV 2 g (12/09/19 0921)     LOS: 2 days        Ketrick Matney Gerome Apley, MD

## 2019-12-09 NOTE — Consult Note (Signed)
Consultation Note Date: 12/09/2019   Patient Name: Connie Lang  DOB: February 14, 1941  MRN: [id number]  Age / Sex: 79 y.o., female  PCP: Laurey Morale, MD Referring Physician: Tawni Millers  Reason for Consultation: Establishing goals of care and Psychosocial/spiritual support  HPI/Patient Profile: 79 y.o. female  with past medical history of stage 4 non small cell lung cancer with mets to the spine and left iliac, breast cancer, vaginal cancer, PMR, macular degeneration (legally blind), OA and obesity who was admitted on 12/07/2019 with post obstructive pneumonia.  She has previously decided not to pursue chemotherapy and has finished palliative radiation therapy.   She has long term chronic pain for which she has been on opioids.  She has had 3 episodes when she took both temazepam and oxycodone that required the use of narcan.   Clinical Assessment and Goals of Care:  I have reviewed medical records including EPIC notes, labs and imaging, received report from the bedside RN, examined the patient and met at bedside with her husband [name]  to discuss diagnosis prognosis, [name], EOL wishes, disposition and options.  I introduced Palliative Medicine as specialized medical care for people living with serious illness. It focuses on providing relief from the symptoms and stress of a serious illness.   We discussed a brief life review of the patient. [name] is a joyful person.  She went to nursing school and worked for a short time as a Marine scientist.  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  She married her highschool sweetheart, Lang, and they appear to be two halves of one whole.  We spent a chunk of our meeting discussing their final arrangements.  They want their ashes mixed together.  Connie Lang have 4 children.  Two daughters Anderson Malta and [name] are in [place] and very supportive of their parents  daily needs.  [name] is in [place], and [name] is in [place].  Connie Lang tell me a story about [name] that starts with him being a Teacher, early years/pre in [place] and ends with him being a Horticulturist, commercial.  He also speaks fluent [place].  Needless to say they are very proud of their family.  As far as functional and nutritional status Connie Lang suffers with macular degeneration and is legally blind.  Despite that she was completely ambulatory and independent with ADL until a few days ago when she developed pneumonia.  She reports eating well.  We discussed her current illness and what it means in the larger context of her on-going co-morbidities.  Natural disease trajectory and expectations at EOL were discussed.  I attempted to elicit values and goals of care important to the patient.  Connie Lang comments "I'm terminal.  We need to figure this out" meaning she wants to put together a plan for her care and for her family.  The difference between aggressive medical intervention and comfort care was considered in light of the patient's goals of care. Advanced directives, concepts specific to code status, artifical  feeding and hydration, and rehospitalization were considered and discussed.  Connie Lang wants to treat the treatable but does not want aggressive medical care.  She wants to be at home with her family as much as possible.  She is a DNR and was a DNR prior to admission.  Hospice and Palliative Care services outpatient were explained and offered.  Connie Lang believe Hospice services would be helpful and would like to engage them this hospitalization for support at discharge.  We discussed symptoms.  Connie Lang's temazepam was discontinued as it potentates her opioid therapy to a dangerous point.  Oxy was increased and remains PRN. Patient has chronic constipation and takes MOM daily at home.  Questions and concerns were addressed.  The family was encouraged to call with questions or concerns.     Primary Decision Maker:  PATIENT in coordination with husband Lang.  They prefer to speak with medical providers together. [name] is her HCPOA if needed.    SUMMARY OF RECOMMENDATIONS     Code status changed to DNR  Home with Hospice when medically appropriate.  Lang specifies that they will eventually want a hospice facility.  Continue to treat the treatable with minimally invasive therapies  Will need oxygen on discharge  Out of bed as tolerated.  Will DC temazepam at husband's request  Will expand the range of oxy to 5 - 10 mg PRN  Milk of magnesia daily.  PMT will follow from a distance, but do not hesitate to call us if we can do anything else to support this delightful couple.  Code Status/Advance Care Planning:  DNR   Symptom Management:   As above.    Palliative Prophylaxis:   Frequent Pain Assessment  Psycho-social/Spiritual:   Desire for further Chaplaincy support: not discussed.  Prognosis: less than 6 months.  Metastatic lung cancer no longer on chemo or radiation.  Now with post obstructive pneumonia that will recur.    Discharge Planning: Home with Hospice      Primary Diagnoses: Present on Admission: . CAP (community acquired pneumonia) . Hyperlipemia, mixed . Essential hypertension . Obesity, unspecified . Breast cancer of upper-outer quadrant of right female breast ([place]) . Spine metastasis ([place]) . Stage IV adenocarcinoma of lung ([place])   I have reviewed the medical record, interviewed the patient and family, and examined the patient. The following aspects are pertinent.  Past Medical History:  Diagnosis Date  . Allergy    occasionally has hayfever  . Anxiety   . Breast cancer ([place]) 2017  . Cancer ([place]) 1987   cancerous cells in vaginal wall seemed to be metastatic from the gut but no primary was ever found   . Cataract   . Chicken pox   . Depression   . DJD (degenerative joint disease)   . First degree AV block   . GERD  (gastroesophageal reflux disease)   . Glaucoma    glaucoma and cataracts sees Dr Satira Sark   . Hyperlipemia   . Hypertension    pt denies currently. " Prednisone made it high."  . LBBB (left bundle branch block)   . Lumbar disc disease    sees Dr. Sherwood Gambler   . Macular degeneration   . Mobitz type 1 second degree atrioventricular block   . Osteoarthritis   . Osteopenia   . Personal history of radiation therapy   . PMR (polymyalgia rheumatica) (HCC)    sees Dr. Leigh Aurora   . Radiation    02-25-16 last radiation for breast  cancer  . Transfusion history    blood    Social History   Socioeconomic History  . Marital status: Married    Spouse name: Not on file  . Number of children: Not on file  . Years of education: Not on file  . Highest education level: Not on file  Occupational History  . Not on file  Tobacco Use  . Smoking status: Former Smoker    Packs/day: 0.50    Years: 36.00    Pack years: 18.00    Types: Cigarettes    Quit date: 07/05/1994    Years since quitting: 25.4  . Smokeless tobacco: Never Used  Substance and Sexual Activity  . Alcohol use: Yes    Alcohol/week: 7.0 standard drinks    Types: 7 Glasses of wine per week    Comment: 08/02/2019- not currently- taking pain medications  . Drug use: No  . Sexual activity: Not on file  Other Topics Concern  . Not on file  Social History Narrative  . Not on file   Social Determinants of Health   Financial Resource Strain:   . Difficulty of Paying Living Expenses:   Food Insecurity:   . Worried About Charity fundraiser in the Last Year:   . Arboriculturist in the Last Year:   Transportation Needs:   . Film/video editor (Medical):   Marland Kitchen Lack of Transportation (Non-Medical):   Physical Activity:   . Days of Exercise per Week:   . Minutes of Exercise per Session:   Stress:   . Feeling of Stress :   Social Connections:   . Frequency of Communication with Friends and Family:   . Frequency of Social  Gatherings with Friends and Family:   . Attends Religious Services:   . Active Member of Clubs or Organizations:   . Attends Archivist Meetings:   Marland Kitchen Marital Status:    Family History  Problem Relation Age of Onset  . Alzheimer's disease Father   . Heart failure Father        d. 5  . Stroke Mother 39  . Cancer Sister        ovarian  . Alzheimer's disease Paternal Aunt   . Breast cancer Paternal Aunt   . Colon cancer Brother        dx. 48s  . Stroke Maternal Aunt        d. 26s  . Heart disease Maternal Grandfather   . Diabetes Maternal Grandfather   . Stroke Paternal Grandmother 73  . Heart attack Paternal Grandfather        d. 2s  . Ovarian cancer Sister        dx. 24s; no genetic testing  . Breast cancer Daughter   . Leukemia Grandchild 21  . Skin cancer Grandchild   . Cervical cancer Cousin 48       maternal 1st cousin  . Alzheimer's disease Paternal Aunt        (x4) paternal aunts  . Heart attack Paternal Uncle 73  . Colon polyps Neg Hx   . Rectal cancer Neg Hx   . Stomach cancer Neg Hx     Allergies  Allergen Reactions  . Temazepam Other (See Comments)    Comatose requiring Narcan     Vital Signs: BP 121/65   Pulse 97   Temp 98.1 F (36.7 C) (Oral)   Resp (!) 22   Ht _0  (1.626 m)   Wt 80.3  kg   LMP  (LMP Unknown) Comment: full  SpO2 92%   BMI 30.38 kg/m  Pain Scale: 0-10   Pain Score: Asleep   SpO2: SpO2: 92 % O2 Device:SpO2: 92 % O2 Flow Rate: .O2 Flow Rate (L/min): 4 L/min    Palliative Assessment/Data: 50%     Time In: 10:00 Time Out: 12:00 Time Total: 120 min. Visit consisted of counseling and education dealing with the complex and emotionally intense issues surrounding the need for palliative care and symptom management in the setting of serious and potentially life-threatening illness. Greater than 50%  of this time was spent counseling and coordinating care related to the above assessment and plan.  Signed  by: Florentina Jenny, PA-C Palliative Medicine  Please contact Palliative Medicine Team phone at [phone] for questions and concerns.  For individual provider: See Shea Evans

## 2019-12-09 NOTE — Progress Notes (Signed)
Pt had confusion upon waking, did have some difficulty remembering what brought her to the hospital, Pt has also had difficulty with urination. Unable to em[ty bladder ( chronic). However, pt voided 541ml on BSC. This nurse spoke with husband of pt and he states pt does not do well with sleeping aid that starts with a "T" and request to stop med due to confusion.  Will update MD

## 2019-12-10 ENCOUNTER — Inpatient Hospital Stay (HOSPITAL_COMMUNITY): Payer: Medicare Other

## 2019-12-10 ENCOUNTER — Ambulatory Visit: Payer: Medicare Other | Admitting: Family Medicine

## 2019-12-10 ENCOUNTER — Telehealth: Payer: Self-pay | Admitting: Family Medicine

## 2019-12-10 DIAGNOSIS — Z7189 Other specified counseling: Secondary | ICD-10-CM

## 2019-12-10 MED ORDER — MORPHINE SULFATE (CONCENTRATE) 10 MG/0.5ML PO SOLN
5.0000 mg | ORAL | Status: DC
  Administered 2019-12-10 – 2019-12-13 (×18): 5 mg via ORAL
  Filled 2019-12-10 (×18): qty 0.5

## 2019-12-10 MED ORDER — FUROSEMIDE 10 MG/ML IJ SOLN
40.0000 mg | Freq: Every day | INTRAMUSCULAR | Status: DC
  Administered 2019-12-10: 40 mg via INTRAVENOUS
  Filled 2019-12-10: qty 4

## 2019-12-10 MED ORDER — DICLOFENAC SODIUM 1 % EX GEL
2.0000 g | Freq: Four times a day (QID) | CUTANEOUS | Status: DC
  Administered 2019-12-10 – 2019-12-13 (×11): 2 g via TOPICAL
  Filled 2019-12-10: qty 100

## 2019-12-10 NOTE — TOC Initial Note (Addendum)
Transition of Care [facility]) - Initial/Assessment Note    Patient Details  Name: Connie Lang MRN: [id number] Date of Birth: 1941/01/07  [facility] CM/SW Contact:    Lynnell Catalan, RN Phone Number: 12/10/2019, 11:22 AM  Clinical Narrative:                 3343 Addendum:  Per [facility] liaison pt and husband have decided on [facility]. [facility] liaison contacted for referral.   1245 Addendum:  After pt and husband spoke with MD today they have decided on hospice. [facility] [place] [facility] and [facility] liaison contacted for referral.    1122 TOC consult for home with hospice. This CM met with pt and husband at bedside for dc planning. When hospice at home was brought up both pt and husband express that they are not interested in hospice at this time. Pt states she understands that she will not get better but she would like to work to get stronger to be able to walk around and get up her stairs at home. Pt states she would like home health services. Pt has rollator at home and has a shower chair. She is requesting a hospital bed and may require home 02. This CM requested MD orders for HHPT/OT/RN/Aide, and hospital bed. Choice was offered for home health services and [facility] was chosen. [facility] will be contacted for hospital bed once order is placed.  Expected Discharge Plan: [place] Barriers to Discharge: Continued Medical Work up   Patient Goals and CMS Choice Patient states their goals for this hospitalization and ongoing recovery are:: To be able to walk up the stairs at home. CMS Medicare.gov Compare Post Acute Care list provided to:: Patient Represenative (must comment) Choice offered to / list presented to : Patient, Spouse  Expected Discharge Plan and Services Expected Discharge Plan: [place]   Discharge Planning Services: CM Consult Post Acute Care Choice: [place] arrangements for the  past 2 months: Single Family Home                 DME Arranged: Hospital bed DME Agency: [facility]       HH Arranged: RN, PT, OT, Nurse's Aide [place] Agency: [facility] [place] Date [facility] Agency Contacted: 12/10/19 Time HH Agency Contacted: 1120 Representative spoke with at [place]: Tommi Rumps  Prior Living Arrangements/Services Living arrangements for the past 2 months: [place] Lives with:: Spouse Patient language and need for interpreter reviewed:: Yes Do you feel safe going back to the place where you live?: Yes      Need for Family Participation in Patient Care: Yes (Comment) Care giver support system in place?: Yes (comment)   Criminal Activity/Legal Involvement Pertinent to Current Situation/Hospitalization: No - Comment as needed  Activities of Daily Living Home Assistive Devices/Equipment: None ADL Screening (condition at time of admission) Patient's cognitive ability adequate to safely complete daily activities?: Yes Is the patient deaf or have difficulty hearing?: No Does the patient have difficulty seeing, even when wearing glasses/contacts?: Yes Does the patient have difficulty concentrating, remembering, or making decisions?: No Patient able to express need for assistance with ADLs?: Yes Does the patient have difficulty dressing or bathing?: No Independently performs ADLs?: No Communication: Independent Dressing (OT): Needs assistance Is this a change from baseline?: Change from baseline, expected to last <3days Grooming: Needs assistance Is this a change from baseline?: Change from baseline, expected to  last <3 days Feeding: Independent Bathing: Needs assistance Is this a change from baseline?: Change from baseline, expected to last <3 days Toileting: Needs assistance Is this a change from baseline?: Change from baseline, expected to last <3 days In/Out Bed: Needs assistance Is this a change from baseline?: Change from baseline, expected to last <3  days Walks in Home: Needs assistance Is this a change from baseline?: Change from baseline, expected to last <3 days Does the patient have difficulty walking or climbing stairs?: Yes Weakness of Legs: None Weakness of Arms/Hands: None  Permission Sought/Granted Permission sought to share information with : Chartered certified accountant granted to share information with : Yes, Verbal Permission Granted     Permission granted to share info w AGENCY: [facility]        Emotional Assessment Appearance:: Appears stated age Attitude/Demeanor/Rapport: Engaged Affect (typically observed): Appropriate Orientation: : Oriented to Self, Oriented to Place, Oriented to  Time, Oriented to Situation      Admission diagnosis:  Shortness of breath [R06.02] Hypoxia [R09.02] CAP (community acquired pneumonia) [J18.9] Community acquired pneumonia of right lower lobe of lung [J18.9] Patient Active Problem List   Diagnosis Date Noted  . DNR (do not resuscitate) 12/09/2019  . CAP (community acquired pneumonia) 12/07/2019  . Thoracic back pain 10/31/2019  . Goals of care, counseling/discussion 09/13/2019  . Stage IV adenocarcinoma of lung ([place]) 09/13/2019  . Encounter for antineoplastic chemotherapy 09/13/2019  . Adenocarcinoma of left lung, stage 4 ([place]) 09/13/2019  . Adenocarcinoma of unknown primary ([place]) 08/23/2019  . Spine metastasis ([place]) 08/06/2019  . Osteoarthritis of left hip 11/11/2016  . Family history of ovarian cancer 01/27/2016  . Family history of colon cancer 01/27/2016  . Breast cancer of upper-outer quadrant of right female breast ([place]) 12/03/2015  . Generalized edema 11/21/2015  . PMR (polymyalgia rheumatica) (HCC) 07/23/2015  . RBBB (right bundle branch block) 11/01/2013  . HTN (hypertension) 11/01/2013  . Obesity, unspecified 11/01/2013  . Pure hypercholesterolemia 11/01/2013  . CARCINOMA, BASAL CELL 07/29/2009  . Hyperlipemia, mixed 07/29/2009  .  GLAUCOMA 07/29/2009  . Essential hypertension 07/29/2009  . Osteoarthritis 07/29/2009  . COLONIC POLYPS, HX OF 07/29/2009   PCP:  Laurey Morale, MD Pharmacy:   [facility] [address] [place] - [address] [place] [address] Phone: [phone] Fax: [phone]     Social Determinants of Health (SDOH) Interventions    Readmission Risk Interventions No flowsheet data found.

## 2019-12-10 NOTE — Care Management Important Message (Signed)
Important Message  Patient Details  Name: Connie Lang MRN: [id number] Date of Birth: 1940-09-14   Medicare Important Message Given:     Document was given to Marney Doctor, NCM  to give to the patient.    Crista Luria 12/10/2019, 11:01 AM

## 2019-12-10 NOTE — Progress Notes (Addendum)
PROGRESS NOTE    Connie Lang  JIR:[id number] DOB: 02/14/1941 DOA: 12/07/2019 PCP: Laurey Morale, MD    Brief Narrative:  Patient admitted to the hospital with a working diagnosis of acute hypoxic respiratory failure due to postobstructive pneumonia in the setting of stage IV lung cancer.  79 year old female who presented with dyspnea and cough. She does have significant past medical history for stage IV metastatic lung cancer, on palliative radiotherapy, she has GERD, osteoarthritis, hypertension, dyslipidemia and obesity class III. Patient has advanced malignancy, she is notunder hospice careyet, but she has decided to stop cancer therapy and follow palliative care. She reported 2 to 3 days of cough and fever, associated with rapid progressive worsening dyspnea. EMS was called and she was found hypoxic,down to 80%. On her initial physical examination her temperature was 98.5, blood pressure 114/70, heart rate 103, respiratory 26, oxygen saturation 86% on room air, 93% on 4 L per nasal cannula. She had coarse breath sounds bilaterally with decreased air movement, bilateral rhonchi and rails. Heart S1-S2, present tachycardic, soft abdomen, no lower extremity edema. Sodium 134, potassium 4.6, chloride 98, bicarb 24, glucose 143, BUN 12, creatinine 0.68, white count 11.8, hemoglobin 10.8, hematocrit 34.2, platelets 412. SARS COVID-19 negative. Chest radiograph with dense right lower lobe infiltrate. EKG 90 bpm, left axis deviation, first degree AV block, left bundle branch block, manually corrected QTc487, sinus rhythm, J-point elevation in V2 through V5, no significant T wave changes.  Patient has been placed on supplemental oxygen and antibiotic therapy. She continue to have significant dyspnea with minimal efforts, Consulted palliative care and plan is to go home with hospice once dyspnea more stable.   Has worsening oxygen requirements, and persistent dyspnea, today on 12 L/  min per HFNC.   She has agreed to have hospice at home when more stable.    Assessment & Plan:   Principal Problem:   CAP (community acquired pneumonia) Active Problems:   Hyperlipemia, mixed   Essential hypertension   Obesity, unspecified   Breast cancer of upper-outer quadrant of right female breast ([place])   Spine metastasis ([place])   Stage IV adenocarcinoma of lung ([place])   DNR (do not resuscitate)    1. Acute hypoxic respiratory failure due to postobstructive right lower lobe pneumonia, present on admission.  Worsening hypoxemia with increased oxygen requirements, now up to 12 L/ min per HFNC. Positive wheezing and decrease air movement.    Continue antibiotic therapy with ceftriaxone IV, aggressive airway clearing techniques with flutter valve and incentive spirometry, plus as needed bronchodilator therapy and antitussive agents.   Will change furosemide to IV for acute non cardiogenic pulmonary edema.   Continue with morphine and oxycodone for palliative dyspnea for severe symptoms. Patient continue to be at high risk for worsening hypoxemic respiratory failure.   2. Stage IV lung adenocarcinoma. Patient not longer getting radiation therapy. She follows with Dr. Julien Nordmann in the cancer center.   I spoke with her husband at the bedside and he and patient agree with continue plan of home with hospice when patient more stable with improved dyspnea.   Continue symptomatic control and oxygen supplementation.   3. HTN. Stable blood pressure 134/69, will continue with amlodipine and losartan for blood pressure control.   4. Dyslipidemia with class 3 obesity. Will need outpatient follow up. Continue to hold statin.   5. Chronic back pain. On acetaminophen, ibuprofen, cyclobenzaprine, gabapentin and oxycodone for pain control.   6. Left arm cyst. Painful cyst to  palpation, no local skin changes to suggest infection. Will add local diclofenac.    Status is:  Inpatient  Remains inpatient appropriate because:IV treatments appropriate due to intensity of illness or inability to take PO and Inpatient level of care appropriate due to severity of illness   Dispo: The patient is from: Home              Anticipated d/c is to: Home              Anticipated d/c date is: 2 days              Patient currently is not medically stable to d/c.   DVT prophylaxis: Enoxaparin   Code Status:    DNR   Family Communication:  I spoke with patient's husband at the bedside, we talked in detail about patient's condition, plan of care and prognosis and all questions were addressed.      Consultants:   Palliative care   P  Antimicrobials:   Ceftriaxone     Subjective: Patient continue to have significant dyspnea with minimal efforts, her oxygen requirements have worsened. No nausea or vomiting. Right arm painful cyst.   Objective: Vitals:   12/09/19 2120 12/09/19 2330 12/10/19 0821 12/10/19 1051  BP:    134/69  Pulse:      Resp:      Temp:      TempSrc:      SpO2: 90% 90% 90%   Weight:      Height:        Intake/Output Summary (Last 24 hours) at 12/10/2019 1150 Last data filed at 12/10/2019 0700 Gross per 24 hour  Intake 200 ml  Output 1175 ml  Net -975 ml   Filed Weights   12/07/19 1602 12/08/19 1359  Weight: 81.6 kg 80.3 kg    Examination:   General: positive dyspnea at rest, deconditioned and ill looking appearing.  Neurology: Awake and alert, non focal  E ENT: positive pallor, no icterus, oral mucosa moist Cardiovascular: No JVD. S1-S2 present, rhythmic, no gallops, rubs, or murmurs. No lower extremity edema. Pulmonary: decreased breath sounds left base with no wheezing, rhonchi or rales. Gastrointestinal. Abdomen soft with, no organomegaly, non tender, no rebound or guarding Skin. No rashes Musculoskeletal: no joint deformities     Data Reviewed: I have personally reviewed following labs and imaging studies  CBC: Recent  Labs  Lab 12/07/19 1617 12/08/19 0055 12/08/19 0446 12/09/19 0540  WBC 11.8* 11.2* 10.3 13.9*  NEUTROABS 9.6*  --   --  11.6*  HGB 10.8* 11.0* 10.5* 9.8*  HCT 34.2* 33.8* 32.8* 30.9*  MCV 96.6 96.8 96.8 96.9  PLT 412* 422* 431* 458*   Basic Metabolic Panel: Recent Labs  Lab 12/07/19 1617 12/08/19 0055 12/08/19 0446 12/09/19 0540  NA 134*  --  133* 138  K 4.6  --  4.6 4.4  CL 98  --  101 106  CO2 24  --  21* 21*  GLUCOSE 143*  --  166* 119*  BUN 12  --  12 12  CREATININE 0.68 0.57 0.59 0.45  CALCIUM 8.6*  --  8.6* 8.9   GFR: Estimated Creatinine Clearance: 57.5 mL/min (by C-G formula based on SCr of 0.45 mg/dL). Liver Function Tests: Recent Labs  Lab 12/07/19 1617 12/08/19 0446  AST 21 15  ALT 17 17  ALKPHOS 231* 209*  BILITOT 0.8 0.6  PROT 6.9 6.7  ALBUMIN 2.6* 2.5*   No results for input(s): LIPASE, AMYLASE  in the last 168 hours. No results for input(s): AMMONIA in the last 168 hours. Coagulation Profile: No results for input(s): INR, PROTIME in the last 168 hours. Cardiac Enzymes: No results for input(s): CKTOTAL, CKMB, CKMBINDEX, TROPONINI in the last 168 hours. BNP (last 3 results) No results for input(s): PROBNP in the last 8760 hours. HbA1C: No results for input(s): HGBA1C in the last 72 hours. CBG: No results for input(s): GLUCAP in the last 168 hours. Lipid Profile: No results for input(s): CHOL, HDL, LDLCALC, TRIG, CHOLHDL, LDLDIRECT in the last 72 hours. Thyroid Function Tests: No results for input(s): TSH, T4TOTAL, FREET4, T3FREE, THYROIDAB in the last 72 hours. Anemia Panel: No results for input(s): VITAMINB12, FOLATE, FERRITIN, TIBC, IRON, RETICCTPCT in the last 72 hours.    Radiology Studies: I have reviewed all of the imaging during this hospital visit personally     Scheduled Meds: . amLODipine  5 mg Oral Daily  . enoxaparin (LOVENOX) injection  40 mg Subcutaneous QHS  . furosemide  40 mg Intravenous Daily  . gabapentin  300 mg  Oral QHS  . ipratropium  0.5 mg Nebulization TID  . latanoprost  1 drop Both Eyes QHS  . levalbuterol  0.63 mg Nebulization TID  . losartan  50 mg Oral Daily  . magnesium hydroxide  15 mL Oral QHS  . pantoprazole  40 mg Oral Daily  . timolol  1 drop Both Eyes BID  . vitamin B-12  2,000 mcg Oral Q1500   Continuous Infusions: . cefTRIAXone (ROCEPHIN)  IV 2 g (12/10/19 0903)     LOS: 3 days        Harlean Regula Gerome Apley, MD

## 2019-12-10 NOTE — Progress Notes (Signed)
Daily Progress Note   Patient Name: Connie Lang       Date: 12/10/2019 DOB: 24-Oct-1940  Age: 79 y.o. MRN#: [id number] Attending Physician: Tawni Millers Primary Care Physician: Laurey Morale, MD Admit Date: 12/07/2019  Reason for Consultation/Follow-up: Continued GOC discussions, Pain control  HPI/Patient Profile: 79 y.o. female  with past medical history of stage 4 non small cell lung cancer with mets to the spine and left iliac, breast cancer, vaginal cancer, PMR, macular degeneration (legally blind), OA and obesity who was admitted on 12/07/2019 with post obstructive pneumonia.  She has previously decided not to pursue chemotherapy and has finished palliative radiation therapy. She has been having dyspnea with even minimal exertion. She has long term chronic back pain for which she has been on opioids.   Subjective: Patient is sitting up in bed, eating lunch. Husband is at the bedside. He asks me to help him figure out how far the different hospice facilities are from his home. He states he is trying to think ahead to prepare for when her condition is as he describes "at the very end". According to [employer], [place] would be closer to his home. He states he will discuss with his daughters.  Note from [facility] states that patient and family had previously chosen [facility].   Patient states her pain has been well controlled with IV morphine since she has been in the hospital.    Length of Stay: 3  Current Medications: Scheduled Meds:  . amLODipine  5 mg Oral Daily  . diclofenac Sodium  2 g Topical QID  . enoxaparin (LOVENOX) injection  40 mg Subcutaneous QHS  . furosemide  40 mg Intravenous Daily  . gabapentin  300 mg Oral QHS  . ipratropium  0.5 mg  Nebulization TID  . latanoprost  1 drop Both Eyes QHS  . levalbuterol  0.63 mg Nebulization TID  . losartan  50 mg Oral Daily  . magnesium hydroxide  15 mL Oral QHS  . morphine CONCENTRATE  5 mg Oral Q4H  . pantoprazole  40 mg Oral Daily  . timolol  1 drop Both Eyes BID  . vitamin B-12  2,000 mcg Oral Q1500    Continuous Infusions: . cefTRIAXone (ROCEPHIN)  IV 2 g (12/10/19 0903)    PRN  Meds: acetaminophen, cyclobenzaprine, diphenhydrAMINE, guaiFENesin, ibuprofen, levalbuterol, menthol-cetylpyridinium, morphine injection, polyvinyl alcohol  Physical Exam Constitutional:      General: She is not in acute distress.    Appearance: She is ill-appearing.  HENT:     Head: Normocephalic and atraumatic.  Pulmonary:     Effort: Pulmonary effort is normal.  Neurological:     Mental Status: She is alert.             Vital Signs: BP 134/69   Pulse (!) 103   Temp 98.6 F (37 C) (Oral)   Resp 16   Ht 5\' 4"  (1.626 m)   Wt 80.3 kg   LMP  (LMP Unknown) Comment: full  SpO2 92%   BMI 30.38 kg/m  SpO2: SpO2: 92 % O2 Device: O2 Device: High Flow Nasal Cannula O2 Flow Rate: O2 Flow Rate (L/min): 12 L/min  Intake/output summary:   Intake/Output Summary (Last 24 hours) at 12/10/2019 1600 Last data filed at 12/10/2019 0700 Gross per 24 hour  Intake --  Output 675 ml  Net -675 ml   LBM: Last BM Date: 12/09/19 Baseline Weight: Weight: 81.6 kg Most recent weight: Weight: 80.3 kg       Palliative Assessment/Data: 50%      Patient Active Problem List   Diagnosis Date Noted  . DNR (do not resuscitate) 12/09/2019  . CAP (community acquired pneumonia) 12/07/2019  . Thoracic back pain 10/31/2019  . Goals of care, counseling/discussion 09/13/2019  . Stage IV adenocarcinoma of lung ([place]) 09/13/2019  . Encounter for antineoplastic chemotherapy 09/13/2019  . Adenocarcinoma of left lung, stage 4 ([place]) 09/13/2019  . Adenocarcinoma of unknown primary ([place]) 08/23/2019  . Spine  metastasis ([place]) 08/06/2019  . Osteoarthritis of left hip 11/11/2016  . Family history of ovarian cancer 01/27/2016  . Family history of colon cancer 01/27/2016  . Breast cancer of upper-outer quadrant of right female breast ([place]) 12/03/2015  . Generalized edema 11/21/2015  . PMR (polymyalgia rheumatica) ([facility]) 07/23/2015  . RBBB (right bundle branch block) 11/01/2013  . HTN (hypertension) 11/01/2013  . Obesity, unspecified 11/01/2013  . Pure hypercholesterolemia 11/01/2013  . CARCINOMA, BASAL CELL 07/29/2009  . Hyperlipemia, mixed 07/29/2009  . GLAUCOMA 07/29/2009  . Essential hypertension 07/29/2009  . Osteoarthritis 07/29/2009  . COLONIC POLYPS, HX OF 07/29/2009    Palliative Care Assessment & Plan   Assessment: Patient with [employer] lung cancer with mets to the spine admitted with postobstructive pneumonia. She has decided to forego chemo or radiation and go home with hospice.   Per review of MAR in the last 24 hours, she has received 6 mg IV morphine and 20 mg oxycodone IR (equivalent to 36 mg oral morphine)  Recommendations/Plan: - start scheduled morphine CONCENTRATE 10mg /0.5 ml oral solution 5 mg every 4 hours  - d/c oxycodone IR - continue morphine 1 mg IV prn for breakthrough pain  Code Status: DNR  Prognosis:   < 6 months  Discharge Planning:  Home with Hospice  Care plan was discussed with patient's family.  Thank you for allowing the Palliative Medicine Team to assist in the care of this patient.   Total Time 35 minutes Prolonged Time Billed  no       Greater than 50%  of this time was spent counseling and coordinating care related to the above assessment and plan.  Lavena Bullion, NP  Mariana Kaufman, AGNP-C  Please contact Palliative Medicine Team phone at [phone] for questions and concerns.

## 2019-12-10 NOTE — Progress Notes (Signed)
Hydrologist [facility]) Hospital Liaison: RN note     Notified by [facility] [place], RN of patient/family request for [facility] services at home after discharge. Chart and patient information under review by [facility] physician. Hospice eligibility pending currently.     Writer spoke with husband, [name] to initiate education related to hospice philosophy, services and team approach to care.  [name] verbalized understanding of information given. Per discussion, plan is for discharge to home by  [facility].   Please send signed and completed DNR form home with patient/family. Patient will need prescriptions for discharge comfort medications.   DME needs have been discussed, patient currently has the following equipment in the home: has a walker and cane.  Patient/family requests the following DME for delivery to the home: hospital bed and oxygen. [place] equipment manager has been notified and will contact DME provider to arrange delivery to the home. Home address has been verified and is correct in the chart. [name] is the family member to contact to arrange time of delivery.      [facility] aware of the above. Please notify [facility] when patient is ready to leave the unit at discharge. (Call [phone] or [phone] after 5pm.) [facility] information and contact numbers given to [name].       Please call with any hospice related questions.  Thank you for this referral.     Farrel Gordon, RN, [facility] (listed on [place] under [place])   [phone]

## 2019-12-10 NOTE — Telephone Encounter (Signed)
Ebony Hail with [facility] is calling because pt is in the hospital and they would like for [facility] to evaluate pt for hospice. Ebony Hail, would like to know if Dr. Sarajane Jews would give his okay for pt to be evaluated. Ebony Hail can be reached at [phone]. Thanks

## 2019-12-11 LAB — BASIC METABOLIC PANEL
Anion gap: 13 (ref 5–15)
BUN: 14 mg/dL (ref 8–23)
CO2: 25 mmol/L (ref 22–32)
Calcium: 8.8 mg/dL — ABNORMAL LOW (ref 8.9–10.3)
Chloride: 98 mmol/L (ref 98–111)
Creatinine, Ser: 0.63 mg/dL (ref 0.44–1.00)
GFR calc Af Amer: >60 mL/min (ref >60)
GFR calc non Af Amer: >60 mL/min (ref >60)
Glucose, Bld: 118 mg/dL — ABNORMAL HIGH (ref 70–99)
Potassium: 4.2 mmol/L (ref 3.5–5.1)
Sodium: 136 mmol/L (ref 135–145)

## 2019-12-11 LAB — CBC WITH DIFFERENTIAL/PLATELET
Abs Immature Granulocytes: 0.15 10*3/uL — ABNORMAL HIGH (ref 0.00–0.07)
Basophils Absolute: 0.1 10*3/uL (ref 0.0–0.1)
Basophils Relative: 1 %
Eosinophils Absolute: 0.2 10*3/uL (ref 0.0–0.5)
Eosinophils Relative: 2 %
HCT: 32.6 % — ABNORMAL LOW (ref 36.0–46.0)
Hemoglobin: 10.1 g/dL — ABNORMAL LOW (ref 12.0–15.0)
Immature Granulocytes: 1 %
Lymphocytes Relative: 7 %
Lymphs Abs: 0.9 10*3/uL (ref 0.7–4.0)
MCH: 30.5 pg (ref 26.0–34.0)
MCHC: 31 g/dL (ref 30.0–36.0)
MCV: 98.5 fL (ref 80.0–100.0)
Monocytes Absolute: 1.5 10*3/uL — ABNORMAL HIGH (ref 0.1–1.0)
Monocytes Relative: 12 %
Neutro Abs: 9.4 10*3/uL — ABNORMAL HIGH (ref 1.7–7.7)
Neutrophils Relative %: 77 %
Platelets: 381 10*3/uL (ref 150–400)
RBC: 3.31 MIL/uL — ABNORMAL LOW (ref 3.87–5.11)
RDW: 15.5 % (ref 11.5–15.5)
WBC: 12.2 10*3/uL — ABNORMAL HIGH (ref 4.0–10.5)
nRBC: 0 % (ref 0.0–0.2)

## 2019-12-11 MED ORDER — FUROSEMIDE 40 MG PO TABS
40.0000 mg | ORAL_TABLET | Freq: Every day | ORAL | Status: DC
  Administered 2019-12-11 – 2019-12-13 (×3): 40 mg via ORAL
  Filled 2019-12-11 (×3): qty 1

## 2019-12-11 NOTE — Telephone Encounter (Signed)
Spoke with Ebony Hail, verbal orders were given. Nothing further needed.

## 2019-12-11 NOTE — Progress Notes (Signed)
PROGRESS NOTE    SHALITA NOTTE  GNF:[id number] DOB: 04-14-1941 DOA: 12/07/2019 PCP: Laurey Morale, MD    Brief Narrative:  Patient admitted to the hospital with a working diagnosis of acute hypoxic respiratory failure due to postobstructive pneumonia in the setting of stage IV lung cancer.  79 year old female who presented with dyspnea and cough. She does have significant past medical history for stage IV metastatic lung cancer, on palliative radiotherapy, she has GERD, osteoarthritis, hypertension, dyslipidemia and obesity class III. Patient has advanced malignancy, she is notunder hospice careyet, but she has decided to stop cancer therapy and follow palliative care. She reported 2 to 3 days of cough and fever, associated with rapid progressive worsening dyspnea. EMS was called and she was found hypoxic,down to 80%. On her initial physical examination her temperature was 98.5, blood pressure 114/70, heart rate 103, respiratory 26, oxygen saturation 86% on room air, 93% on 4 L per nasal cannula. She had coarse breath sounds bilaterally with decreased air movement, bilateral rhonchi and rails. Heart S1-S2, present tachycardic, soft abdomen, no lower extremity edema. Sodium 134, potassium 4.6, chloride 98, bicarb 24, glucose 143, BUN 12, creatinine 0.68, white count 11.8, hemoglobin 10.8, hematocrit 34.2, platelets 412. SARS COVID-19 negative. Chest radiograph with dense right lower lobe infiltrate. EKG 90 bpm, left axis deviation, first degree AV block, left bundle branch block, manually corrected QTc487, sinus rhythm, J-point elevation in V2 through V5, no significant T wave changes.  Patient has been placed on supplemental oxygen and antibiotic therapy. She continue to have significant dyspnea with minimal efforts, Consulted palliative care and plan is to go home with hospice once dyspnea more stable.  Had worsening oxygen requirements, and persistent dyspnea requireing high  flow oxygen 10 to 12 L/ min   She has agreed to have hospice at home when more stable. Patient can be discharge on high flow nasal cannula home with hospice services if her dyspnea is controlled.     Assessment & Plan:   Principal Problem:   CAP (community acquired pneumonia) Active Problems:   Hyperlipemia, mixed   Essential hypertension   Obesity, unspecified   Breast cancer of upper-outer quadrant of right female breast ([place])   Spine metastasis ([place])   Stage IV adenocarcinoma of lung ([place])   DNR (do not resuscitate)   Advanced care planning/counseling discussion    1. Acute hypoxic respiratory failure due to postobstructive right lower lobe pneumonia, present on admission. This am her dyspnea has improved but not yet back to baseline, her oxymetry is 88 to 92 on 10 L/ min per HFNC. Patient sp one dose of IV furosemide for non cardiogenic pulmonary edema.  wbc is 12,2 today.   On antibiotic therapy with ceftriaxone IV, plus aggressiveairway clearing techniques with flutter valve and incentive spirometry. Continue with scheduled and as needed bronchodilator therapy. On prn antitussive agents.  On morphine for palliative dyspnea.   She continue at risk of worsening hypoxemic respiratory failure.  2. Stage IV lung adenocarcinoma. Patient not longer getting radiation therapy. She follows with Dr. Julien Nordmann in the cancer center.   No further cancer therapy, will follow with hospice services at discharge.   3. HTN.Continue with amlodipine and losartan for blood pressure control.  Will resume oral furosemide 40 mg daily.   4. Dyslipidemia with class 3 obesity. Holding statin for now, patient with very poor prognosis.  5. Chronic back pain.Continue with acetaminophen, ibuprofen, cyclobenzaprine, gabapentin and morphine with good toleration.  6. Left arm cyst.  responding well to topical pain control with  local diclofenac.     Status is: Inpatient  Remains  inpatient appropriate because:IV treatments appropriate due to intensity of illness or inability to take PO   Dispo: The patient is from: Home              Anticipated d/c is to: Home              Anticipated d/c date is: 2 days              Patient currently is not medically stable to d/c.   DVT prophylaxis: Enoxaparin   Code Status:   dnr   Family Communication:  I spoke with patient's husband at the bedside, we talked in detail about patient's condition, plan of care and prognosis and all questions were addressed.    Consultants:   Palliative care      Subjective: Patient continue to have dyspnea with minimal efforts, no nausea or vomiting, no chest pain.   Objective: Vitals:   12/10/19 2017 12/10/19 2026 12/11/19 0608 12/11/19 0820  BP:  95/65 110/69   Pulse:  84 (!) 102   Resp:  18 16   Temp:  98.5 F (36.9 C) 98.7 F (37.1 C)   TempSrc:  Oral Oral   SpO2: 91% 92% 90% (!) 89%  Weight:      Height:        Intake/Output Summary (Last 24 hours) at 12/11/2019 1223 Last data filed at 12/11/2019 1032 Gross per 24 hour  Intake 360 ml  Output 500 ml  Net -140 ml   Filed Weights   12/07/19 1602 12/08/19 1359  Weight: 81.6 kg 80.3 kg    Examination:   General: Not in pain, positive dyspnea at rest, deconditioned and ill looking appearing  Neurology: Awake and alert, non focal  E ENT: mild pallor, no icterus, oral mucosa moist Cardiovascular: No JVD. S1-S2 present, rhythmic, no gallops, rubs, or murmurs. No lower extremity edema. Pulmonary: decreased breath sounds bilaterally, with no wheezing, rhonchi or rales. Gastrointestinal. Abdomen with no organomegaly, non tender, no rebound or guarding Skin. No rashes Musculoskeletal: no joint deformities     Data Reviewed: I have personally reviewed following labs and imaging studies  CBC: Recent Labs  Lab 12/07/19 1617 12/08/19 0055 12/08/19 0446 12/09/19 0540 12/11/19 0645  WBC 11.8* 11.2* 10.3 13.9* 12.2*    NEUTROABS 9.6*  --   --  11.6* 9.4*  HGB 10.8* 11.0* 10.5* 9.8* 10.1*  HCT 34.2* 33.8* 32.8* 30.9* 32.6*  MCV 96.6 96.8 96.8 96.9 98.5  PLT 412* 422* 431* 417* 366   Basic Metabolic Panel: Recent Labs  Lab 12/07/19 1617 12/08/19 0055 12/08/19 0446 12/09/19 0540 12/11/19 0645  NA 134*  --  133* 138 136  K 4.6  --  4.6 4.4 4.2  CL 98  --  101 106 98  CO2 24  --  21* 21* 25  GLUCOSE 143*  --  166* 119* 118*  BUN 12  --  12 12 14   CREATININE 0.68 0.57 0.59 0.45 0.63  CALCIUM 8.6*  --  8.6* 8.9 8.8*   GFR: Estimated Creatinine Clearance: 57.5 mL/min (by C-G formula based on SCr of 0.63 mg/dL). Liver Function Tests: Recent Labs  Lab 12/07/19 1617 12/08/19 0446  AST 21 15  ALT 17 17  ALKPHOS 231* 209*  BILITOT 0.8 0.6  PROT 6.9 6.7  ALBUMIN 2.6* 2.5*   No results for input(s): LIPASE, AMYLASE in  the last 168 hours. No results for input(s): AMMONIA in the last 168 hours. Coagulation Profile: No results for input(s): INR, PROTIME in the last 168 hours. Cardiac Enzymes: No results for input(s): CKTOTAL, CKMB, CKMBINDEX, TROPONINI in the last 168 hours. BNP (last 3 results) No results for input(s): PROBNP in the last 8760 hours. HbA1C: No results for input(s): HGBA1C in the last 72 hours. CBG: No results for input(s): GLUCAP in the last 168 hours. Lipid Profile: No results for input(s): CHOL, HDL, LDLCALC, TRIG, CHOLHDL, LDLDIRECT in the last 72 hours. Thyroid Function Tests: No results for input(s): TSH, T4TOTAL, FREET4, T3FREE, THYROIDAB in the last 72 hours. Anemia Panel: No results for input(s): VITAMINB12, FOLATE, FERRITIN, TIBC, IRON, RETICCTPCT in the last 72 hours.    Radiology Studies: I have reviewed all of the imaging during this hospital visit personally     Scheduled Meds: . amLODipine  5 mg Oral Daily  . diclofenac Sodium  2 g Topical QID  . enoxaparin (LOVENOX) injection  40 mg Subcutaneous QHS  . furosemide  40 mg Intravenous Daily  .  gabapentin  300 mg Oral QHS  . ipratropium  0.5 mg Nebulization TID  . latanoprost  1 drop Both Eyes QHS  . levalbuterol  0.63 mg Nebulization TID  . losartan  50 mg Oral Daily  . morphine CONCENTRATE  5 mg Oral Q4H  . pantoprazole  40 mg Oral Daily  . timolol  1 drop Both Eyes BID  . vitamin B-12  2,000 mcg Oral Q1500   Continuous Infusions: . cefTRIAXone (ROCEPHIN)  IV 2 g (12/11/19 0848)     LOS: 4 days        Jevan Gaunt Gerome Apley, MD

## 2019-12-11 NOTE — Telephone Encounter (Signed)
Yes I agree with Hospice seeing her

## 2019-12-11 NOTE — Care Management Important Message (Signed)
Important Message  Patient Details IM Letter given to Marney Doctor RN Case Manager to present to the Patient Name: Connie Lang MRN: [id number] Date of Birth: 1941/06/19   Medicare Important Message Given:  Yes     Kerin Salen 12/11/2019, 10:39 AM

## 2019-12-12 LAB — CBC WITH DIFFERENTIAL/PLATELET
Abs Immature Granulocytes: 0.18 10*3/uL — ABNORMAL HIGH (ref 0.00–0.07)
Basophils Absolute: 0.1 10*3/uL (ref 0.0–0.1)
Basophils Relative: 0 %
Eosinophils Absolute: 0.2 10*3/uL (ref 0.0–0.5)
Eosinophils Relative: 2 %
HCT: 32.8 % — ABNORMAL LOW (ref 36.0–46.0)
Hemoglobin: 10.2 g/dL — ABNORMAL LOW (ref 12.0–15.0)
Immature Granulocytes: 2 %
Lymphocytes Relative: 8 %
Lymphs Abs: 0.9 10*3/uL (ref 0.7–4.0)
MCH: 30.2 pg (ref 26.0–34.0)
MCHC: 31.1 g/dL (ref 30.0–36.0)
MCV: 97 fL (ref 80.0–100.0)
Monocytes Absolute: 1.4 10*3/uL — ABNORMAL HIGH (ref 0.1–1.0)
Monocytes Relative: 12 %
Neutro Abs: 8.9 10*3/uL — ABNORMAL HIGH (ref 1.7–7.7)
Neutrophils Relative %: 76 %
Platelets: 361 10*3/uL (ref 150–400)
RBC: 3.38 MIL/uL — ABNORMAL LOW (ref 3.87–5.11)
RDW: 15.3 % (ref 11.5–15.5)
WBC: 11.7 10*3/uL — ABNORMAL HIGH (ref 4.0–10.5)
nRBC: 0 % (ref 0.0–0.2)

## 2019-12-12 LAB — BASIC METABOLIC PANEL
Anion gap: 13 (ref 5–15)
BUN: 15 mg/dL (ref 8–23)
CO2: 25 mmol/L (ref 22–32)
Calcium: 8.8 mg/dL — ABNORMAL LOW (ref 8.9–10.3)
Chloride: 97 mmol/L — ABNORMAL LOW (ref 98–111)
Creatinine, Ser: 0.48 mg/dL (ref 0.44–1.00)
GFR calc Af Amer: >60 mL/min (ref >60)
GFR calc non Af Amer: >60 mL/min (ref >60)
Glucose, Bld: 110 mg/dL — ABNORMAL HIGH (ref 70–99)
Potassium: 3.5 mmol/L (ref 3.5–5.1)
Sodium: 135 mmol/L (ref 135–145)

## 2019-12-12 MED ORDER — DOCUSATE SODIUM 100 MG PO CAPS
100.0000 mg | ORAL_CAPSULE | Freq: Two times a day (BID) | ORAL | Status: DC
  Administered 2019-12-12 – 2019-12-13 (×3): 100 mg via ORAL
  Filled 2019-12-12 (×3): qty 1

## 2019-12-12 NOTE — Progress Notes (Signed)
PROGRESS NOTE    Connie Lang  JQZ:[id number] DOB: [birth year] DOA: 12/07/2019 PCP: Laurey Morale, MD   Brief Narrative:   79 year old female who presented with dyspnea and cough. She does have significant past medical history for stage IV metastatic lung cancer, on palliative radiotherapy, she has GERD, osteoarthritis, hypertension, dyslipidemia and obesity class III. Patient has advanced malignancy, she is notunder hospice careyet, but she has decided to stop cancer therapy and follow palliative care. She reported 2 to 3 days of cough and fever, associated with rapid progressive worsening dyspnea. EMS was called and she was found hypoxic,down to 80%. On her initial physical examination her temperature was 98.5, blood pressure 114/70, heart rate 103, respiratory 26, oxygen saturation 86% on room air, 93% on 4 L per nasal cannula. She had coarse breath sounds bilaterally with decreased air movement, bilateral rhonchi and rails. Heart S1-S2, present tachycardic, soft abdomen, no lower extremity edema. Sodium 134, potassium 4.6, chloride 98, bicarb 24, glucose 143, BUN 12, creatinine 0.68, white count 11.8, hemoglobin 10.8, hematocrit 34.2, platelets 412. SARS COVID-19 negative. Chest radiograph with dense right lower lobe infiltrate. EKG 90 bpm, left axis deviation, first degree AV block, left bundle branch block, manually corrected QTc487, sinus rhythm, J-point elevation in V2 through V5, no significant T wave changes.  Patient has been placed on supplemental oxygen and antibiotic therapy. She continue to have significant dyspnea with minimal efforts, Consulted palliative care and plan is to go home with hospice once dyspnea more stable.  Had worsening oxygen requirements, and persistent dyspnea requireing high flow oxygen 10 to 12 L/ min   She has agreed to have hospice at home when more stable. Patient can be discharge on high flow nasal cannula home with hospice services if  her dyspnea is controlled.  6/9: Resp status slowly improving. Spoke with dtr. Everything should be in place for discharge to home with hospice tomorrow. Will look for discharge then.    Assessment & Plan:   Principal Problem:   CAP (community acquired pneumonia) Active Problems:   Hyperlipemia, mixed   Essential hypertension   Obesity, unspecified   Breast cancer of upper-outer quadrant of right female breast ([place])   Spine metastasis ([place])   Stage IV adenocarcinoma of lung ([place])   DNR (do not resuscitate)   Advanced care planning/counseling discussion  Acute hypoxic respiratory failure due to postobstructive right lower lobe pneumonia, present on admission.  - WBC slowly improving. Weaned to 10L HFNC. She can go home with hospice at this level  - ceftriaxone completed  - continue flutter valve and IS  Stage IV lung adenocarcinoma  - Patient no longer getting radiation therapy.   - She follows with Dr. Julien Nordmann in the cancer center.  - Going home with hospice   HTN  - continue with amlodipine and losartan for blood pressure control  - continue furosemide 40 mg daily.   Dyslipidemia with class 3 obesity  - statin held  - can resume at discharge  Chronic back pain  - continue withacetaminophen, ibuprofen, cyclobenzaprine, gabapentin and morphinewith good toleration.  Left arm cyst  - responding well to topical pain control with local diclofenac.  - denies complaints today  DVT prophylaxis: lovenox Code Status: DNR Family Communication: Spoke with dtr, Diona Foley @ [phone], and updated with plan.     Status is: Inpatient  Remains inpatient appropriate because:Awaiting home hospice equipment   Dispo: The patient is from: Home  Anticipated d/c is to: Home              Anticipated d/c date is: 1 day              Patient currently is medically stable to d/c. Remains in hospital awaiting receipt of home hospice equipment.  Consultants:    Palliative Care   ROS:  Reports improved dyspnea . Remainder 10-pt ROS is negative for all not previously mentioned.  Subjective: "They told me I need to get to 5L."  Objective: Vitals:   12/12/19 0753 12/12/19 1407 12/12/19 1452 12/12/19 1500  BP:  100/63    Pulse:  75    Resp:  15    Temp:  98.1 F (36.7 C)    TempSrc:  Oral    SpO2: 98% 91% 92% 91%  Weight:      Height:        Intake/Output Summary (Last 24 hours) at 12/12/2019 1732 Last data filed at 12/12/2019 1400 Gross per 24 hour  Intake 0 ml  Output --  Net 0 ml   Filed Weights   12/07/19 1602 12/08/19 1359  Weight: 81.6 kg 80.3 kg    Examination:  General: 79 y.o. female resting in bed in NAD Cardiovascular: RRR, +S1, S2, no m/g/r, equal pulses throughout Respiratory: decreased at bases, but good air movement GI: BS+, NDNT, no masses noted, no organomegaly noted MSK: No e/c/c Neuro: A&O x 3, no focal deficits  Data Reviewed: I have personally reviewed following labs and imaging studies.  CBC: Recent Labs  Lab 12/07/19 1617 12/07/19 1617 12/08/19 0055 12/08/19 0446 12/09/19 0540 12/11/19 0645 12/12/19 0552  WBC 11.8*   < > 11.2* 10.3 13.9* 12.2* 11.7*  NEUTROABS 9.6*  --   --   --  11.6* 9.4* 8.9*  HGB 10.8*   < > 11.0* 10.5* 9.8* 10.1* 10.2*  HCT 34.2*   < > 33.8* 32.8* 30.9* 32.6* 32.8*  MCV 96.6   < > 96.8 96.8 96.9 98.5 97.0  PLT 412*   < > 422* 431* 417* 381 361   < > = values in this interval not displayed.   Basic Metabolic Panel: Recent Labs  Lab 12/07/19 1617 12/07/19 1617 12/08/19 0055 12/08/19 0446 12/09/19 0540 12/11/19 0645 12/12/19 0552  NA 134*  --   --  133* 138 136 135  K 4.6  --   --  4.6 4.4 4.2 3.5  CL 98  --   --  101 106 98 97*  CO2 24  --   --  21* 21* 25 25  GLUCOSE 143*  --   --  166* 119* 118* 110*  BUN 12  --   --  12 12 14 15   CREATININE 0.68   < > 0.57 0.59 0.45 0.63 0.48  CALCIUM 8.6*  --   --  8.6* 8.9 8.8* 8.8*   < > = values in this interval not  displayed.   GFR: Estimated Creatinine Clearance: 57.5 mL/min (by C-G formula based on SCr of 0.48 mg/dL). Liver Function Tests: Recent Labs  Lab 12/07/19 1617 12/08/19 0446  AST 21 15  ALT 17 17  ALKPHOS 231* 209*  BILITOT 0.8 0.6  PROT 6.9 6.7  ALBUMIN 2.6* 2.5*   No results for input(s): LIPASE, AMYLASE in the last 168 hours. No results for input(s): AMMONIA in the last 168 hours. Coagulation Profile: No results for input(s): INR, PROTIME in the last 168 hours. Cardiac Enzymes: No results for  input(s): CKTOTAL, CKMB, CKMBINDEX, TROPONINI in the last 168 hours. BNP (last 3 results) No results for input(s): PROBNP in the last 8760 hours. HbA1C: No results for input(s): HGBA1C in the last 72 hours. CBG: No results for input(s): GLUCAP in the last 168 hours. Lipid Profile: No results for input(s): CHOL, HDL, LDLCALC, TRIG, CHOLHDL, LDLDIRECT in the last 72 hours. Thyroid Function Tests: No results for input(s): TSH, T4TOTAL, FREET4, T3FREE, THYROIDAB in the last 72 hours. Anemia Panel: No results for input(s): VITAMINB12, FOLATE, FERRITIN, TIBC, IRON, RETICCTPCT in the last 72 hours. Sepsis Labs: No results for input(s): PROCALCITON, LATICACIDVEN in the last 168 hours.  Recent Results (from the past 240 hour(s))  SARS Coronavirus 2 by RT PCR (hospital order, performed in [facility] hospital lab) Nasopharyngeal Nasopharyngeal Swab     Status: None   Collection Time: 12/07/19  4:17 PM   Specimen: Nasopharyngeal Swab  Result Value Ref Range Status   SARS Coronavirus 2 NEGATIVE NEGATIVE Final    Comment: (NOTE) SARS-CoV-2 target nucleic acids are NOT DETECTED. The SARS-CoV-2 RNA is generally detectable in upper and lower respiratory specimens during the acute phase of infection. The lowest concentration of SARS-CoV-2 viral copies this assay can detect is 250 copies / mL. A negative result does not preclude SARS-CoV-2 infection and should not be used as the sole basis for  treatment or other patient management decisions.  A negative result may occur with improper specimen collection / handling, submission of specimen other than nasopharyngeal swab, presence of viral mutation(s) within the areas targeted by this assay, and inadequate number of viral copies (<250 copies / mL). A negative result must be combined with clinical observations, patient history, and epidemiological information. Fact Sheet for Patients:   StrictlyIdeas.no Fact Sheet for Healthcare Providers: BankingDealers.co.za This test is not yet approved or cleared  by the [place] FDA and has been authorized for detection and/or diagnosis of SARS-CoV-2 by FDA under an Emergency Use Authorization (EUA).  This EUA will remain in effect (meaning this test can be used) for the duration of the COVID-19 declaration under Section 564(b)(1) of the Act, 21 U.S.C. section 360bbb-3(b)(1), unless the authorization is terminated or revoked sooner. Performed at [facility], [place] [address] [place], [place] [address]   Culture, blood (routine x 2) Call MD if unable to obtain prior to antibiotics being given     Status: None (Preliminary result)   Collection Time: 12/08/19 12:55 AM   Specimen: BLOOD  Result Value Ref Range Status   Specimen Description   Final    BLOOD RIGHT WRIST Performed at [facility] [address], [place], [place] [address]    Special Requests   Final    BOTTLES DRAWN AEROBIC AND ANAEROBIC Blood Culture adequate volume Performed at [facility] [address], [place], [place] [address]    Culture   Final    NO GROWTH 4 DAYS Performed at [facility], [place] [address], [place], [place] [address]    Report Status PENDING  Incomplete      Radiology Studies: No results found.   Scheduled Meds: . amLODipine  5 mg Oral Daily  . diclofenac Sodium  2 g Topical  QID  . docusate sodium  100 mg Oral BID  . enoxaparin (LOVENOX) injection  40 mg Subcutaneous QHS  . furosemide  40 mg Oral Daily  . gabapentin  300 mg Oral QHS  . ipratropium  0.5 mg Nebulization  TID  . latanoprost  1 drop Both Eyes QHS  . levalbuterol  0.63 mg Nebulization TID  . losartan  50 mg Oral Daily  . morphine CONCENTRATE  5 mg Oral Q4H  . pantoprazole  40 mg Oral Daily  . timolol  1 drop Both Eyes BID  . vitamin B-12  2,000 mcg Oral Q1500   Continuous Infusions:   LOS: 5 days    Time spent: 25 minutes spent in the coordination of care today.    Jonnie Finner, DO [facility]  If 7PM-7AM, please contact night-coverage [email or web address] 12/12/2019, 5:32 PM

## 2019-12-13 ENCOUNTER — Other Ambulatory Visit: Payer: Self-pay | Admitting: Family Medicine

## 2019-12-13 DIAGNOSIS — Z515 Encounter for palliative care: Secondary | ICD-10-CM

## 2019-12-13 LAB — CBC WITH DIFFERENTIAL/PLATELET
Abs Immature Granulocytes: 0.24 10*3/uL — ABNORMAL HIGH (ref 0.00–0.07)
Basophils Absolute: 0.1 10*3/uL (ref 0.0–0.1)
Basophils Relative: 1 %
Eosinophils Absolute: 0.4 10*3/uL (ref 0.0–0.5)
Eosinophils Relative: 3 %
HCT: 31.7 % — ABNORMAL LOW (ref 36.0–46.0)
Hemoglobin: 10 g/dL — ABNORMAL LOW (ref 12.0–15.0)
Immature Granulocytes: 2 %
Lymphocytes Relative: 6 %
Lymphs Abs: 0.7 10*3/uL (ref 0.7–4.0)
MCH: 30.9 pg (ref 26.0–34.0)
MCHC: 31.5 g/dL (ref 30.0–36.0)
MCV: 97.8 fL (ref 80.0–100.0)
Monocytes Absolute: 1.3 10*3/uL — ABNORMAL HIGH (ref 0.1–1.0)
Monocytes Relative: 12 %
Neutro Abs: 8.4 10*3/uL — ABNORMAL HIGH (ref 1.7–7.7)
Neutrophils Relative %: 76 %
Platelets: 361 10*3/uL (ref 150–400)
RBC: 3.24 MIL/uL — ABNORMAL LOW (ref 3.87–5.11)
RDW: 15.2 % (ref 11.5–15.5)
WBC: 11 10*3/uL — ABNORMAL HIGH (ref 4.0–10.5)
nRBC: 0 % (ref 0.0–0.2)

## 2019-12-13 LAB — RENAL FUNCTION PANEL
Albumin: 2.3 g/dL — ABNORMAL LOW (ref 3.5–5.0)
Anion gap: 12 (ref 5–15)
BUN: 18 mg/dL (ref 8–23)
CO2: 26 mmol/L (ref 22–32)
Calcium: 8.7 mg/dL — ABNORMAL LOW (ref 8.9–10.3)
Chloride: 97 mmol/L — ABNORMAL LOW (ref 98–111)
Creatinine, Ser: 0.56 mg/dL (ref 0.44–1.00)
GFR calc Af Amer: >60 mL/min (ref >60)
GFR calc non Af Amer: >60 mL/min (ref >60)
Glucose, Bld: 127 mg/dL — ABNORMAL HIGH (ref 70–99)
Phosphorus: 4.1 mg/dL (ref 2.5–4.6)
Potassium: 3.4 mmol/L — ABNORMAL LOW (ref 3.5–5.1)
Sodium: 135 mmol/L (ref 135–145)

## 2019-12-13 LAB — CULTURE, BLOOD (ROUTINE X 2)
Culture: NO GROWTH
Special Requests: ADEQUATE

## 2019-12-13 MED ORDER — DOCUSATE SODIUM 100 MG PO CAPS: 100.0000 mg | ORAL_CAPSULE | Freq: Two times a day (BID) | ORAL | 0 refills | Status: AC

## 2019-12-13 MED ORDER — IPRATROPIUM BROMIDE 0.02 % IN SOLN: 0.5000 mg | Freq: Three times a day (TID) | RESPIRATORY_TRACT | 0 refills | Status: AC

## 2019-12-13 MED ORDER — MORPHINE SULFATE (CONCENTRATE) 10 MG/0.5ML PO SOLN: 5.0000 mg | ORAL | 0 refills | Status: DC

## 2019-12-13 MED ORDER — LEVALBUTEROL HCL 0.63 MG/3ML IN NEBU: 0.6300 mg | INHALATION_SOLUTION | Freq: Three times a day (TID) | RESPIRATORY_TRACT | 0 refills | Status: AC

## 2019-12-13 NOTE — TOC Transition Note (Signed)
Transition of Care [facility]) - CM/SW Discharge Note   Patient Details  Name: Connie Lang MRN: [id number] Date of Birth: 05-23-1941  Transition of [facility]) CM/SW Contact:  Lynnell Catalan, RN Phone Number: 12/13/2019, 2:10 PM   Clinical Narrative:     Pt to dc home with hospice today. DME delivered to the home this am.  Yellow DNR on chart for transport. [facility] contacted and alerted that pt on 10L 02.    Final next level of care: Home w Hospice Care Barriers to Discharge: No Barriers Identified   Patient Goals and CMS Choice Patient states their goals for this hospitalization and ongoing recovery are:: To be able to walk up the stairs at home. CMS Medicare.gov Compare Post Acute Care list provided to:: Patient Represenative (must comment) Choice offered to / list presented to : Patient, Spouse  Discharge Plan and Services   Discharge Planning Services: [place]        DME Arranged: Hospital bed home 02 DME Agency: [place] Arranged: Disease Management [place] Agency: Hospice and [place]   Readmission Risk Interventions No flowsheet data found.

## 2019-12-13 NOTE — Progress Notes (Signed)
RT attempted times 3 to administer nebulizer treatment- RT will attempt to come back at a later time if possible. PT has PRN order available if needed.

## 2019-12-13 NOTE — Progress Notes (Signed)
Patient discharged to home via [facility], discharge instructions reviewed with patient and husband who verbalized understanding.

## 2019-12-13 NOTE — Progress Notes (Signed)
Hydrologist [facility]) Hospital Liaison: RN note     Notified by [facility] [place], RN of patient/family request for [facility] services at home after discharge. Chart and patient information  reviewed by [facility] physician. Hospice eligibility confirmed.     Writer spoke with husband, [name] to initiate education related to hospice philosophy, services and team approach to care.  [name] verbalized understanding of information given. Per discussion, plan is for discharge to home by  [facility].   Please send signed and completed DNR form home with patient/family. Patient will need prescriptions for discharge comfort medications.   Additional DME: 3N1 and Nebulizer have been ordered.      [facility] aware of the above. Please notify [facility] when patient is ready to leave the unit at discharge. (Call [phone] or [phone] after 5pm.) [facility] information and contact numbers given to [name].       Please call with any hospice related questions.  Thank you for this referral.     Farrel Gordon, RN, [facility] (listed on [place] under [place])   [phone]

## 2019-12-13 NOTE — Discharge Summary (Signed)
Physician Discharge Summary  Connie Lang QXI:[id number] DOB: 31-Dec-1940 DOA: 12/07/2019  PCP: Laurey Morale, MD  Admit date: 12/07/2019 Discharge date: 12/13/2019  Admitted From: Home Disposition:  Home with hospice  Recommendations for Outpatient Follow-up:  1. Follow up with hospice care.   Discharge Condition: Stable  CODE STATUS: DNR   Brief/Interim Summary: 79 year old female who presented with dyspnea and cough. She does have significant past medical history for stage IV metastatic lung cancer, on palliative radiotherapy, she has GERD, osteoarthritis, hypertension, dyslipidemia and obesity class III. Patient has advanced malignancy, she is notunder hospice careyet, but she has decided to stop cancer therapy and follow palliative care. She reported 2 to 3 days of cough and fever, associated with rapid progressive worsening dyspnea. EMS was called and she was found hypoxic,down to 80%. On her initial physical examination her temperature was 98.5, blood pressure 114/70, heart rate 103, respiratory 26, oxygen saturation 86% on room air, 93% on 4 L per nasal cannula. She had coarse breath sounds bilaterally with decreased air movement, bilateral rhonchi and rails. Heart S1-S2, present tachycardic, soft abdomen, no lower extremity edema. Sodium 134, potassium 4.6, chloride 98, bicarb 24, glucose 143, BUN 12, creatinine 0.68, white count 11.8, hemoglobin 10.8, hematocrit 34.2, platelets 412. SARS COVID-19 negative. Chest radiograph with dense right lower lobe infiltrate. EKG 90 bpm, left axis deviation, first degree AV block, left bundle branch block, manually corrected QTc487, sinus rhythm, J-point elevation in V2 through V5, no significant T wave changes.  Patient has been placed on supplemental oxygen and antibiotic therapy. She continue to have significant dyspnea with minimal efforts, Consulted palliative care and plan is to go home with hospice once dyspnea more  stable.  Hadworsening oxygen requirements, and persistent dyspnearequireing high flow oxygen 10 to 12 L/ min  She has agreed to have hospice at home when more stable.Patient can be discharge on high flow nasal cannula home with hospice services if her dyspnea is controlled.  6/9: Resp status slowly improving. Spoke with dtr. Everything should be in place for discharge to home with hospice tomorrow. Will look for discharge then.   6/10: Resp status is good this morning. Hospice equipment is in place. She will go home on 10L [place]. She is stable for discharge.   Discharge Diagnoses:  Principal Problem:   CAP (community acquired pneumonia) Active Problems:   Hyperlipemia, mixed   Essential hypertension   Obesity, unspecified   Breast cancer of upper-outer quadrant of right female breast ([place])   Spine metastasis ([place])   Stage IV adenocarcinoma of lung ([place])   DNR (do not resuscitate)   Advanced care planning/counseling discussion  Acute hypoxic respiratory failure due to postobstructive right lower lobe pneumonia, present on admission.  - WBC slowly improving. Weaned to 10L HFNC. She can go home with hospice at this level  - ceftriaxone completed  - continue flutter valve and IS  - 6/10: on 9 - 10L HFNC. Will continue at discharge. She is good for discharge today home with hospice.   Stage IV lung adenocarcinoma  - Patient no longer getting radiation therapy.   - She follows with Dr. Julien Nordmann in the cancer center.  - Going home with hospice   HTN  - continue with amlodipine and losartan for blood pressure control  - continue furosemide 40 mg daily.  - 6/10: continue current regimen at discharge   Dyslipidemia with class 3 obesity  - statin held  - can resume at discharge  Chronic back  pain  - continue withacetaminophen, ibuprofen, cyclobenzaprine, gabapentin and morphinewith good toleration.  Left arm cyst  - responding well to topical pain control with local  diclofenac.  - denies complaints today  Discharge Instructions   Allergies as of 12/13/2019      Reactions   Temazepam Other (See Comments)   Comatose requiring Narcan      Medication List    STOP taking these medications   oxyCODONE 5 MG immediate release tablet Commonly known as: Roxicodone   potassium chloride 10 MEQ tablet Commonly known as: KLOR-CON   temazepam 30 MG capsule Commonly known as: RESTORIL     TAKE these medications   acetaminophen 650 MG CR tablet Commonly known as: TYLENOL Take 650-1,300 mg by mouth every 8 (eight) hours as needed for pain.   amLODipine 5 MG tablet Commonly known as: NORVASC TAKE 1 TABLET BY MOUTH ONCE DAILY IN THE EVENING What changed: when to take this   anti-nausea solution Take 10 mLs by mouth every 15 (fifteen) minutes as needed for nausea or vomiting.   chlorpheniramine 4 MG tablet Commonly known as: CHLOR-TRIMETON Take 4 mg by mouth daily as needed for allergies.   cyclobenzaprine 10 MG tablet Commonly known as: FLEXERIL Take 1 tablet by mouth three times daily as needed for muscle spasm   diphenhydrAMINE 12.5 MG chewable tablet Commonly known as: BENADRYL Chew 12.5 mg by mouth 2 (two) times daily as needed (for allergic reaction (lip swelling)).   docusate sodium 100 MG capsule Commonly known as: COLACE Take 1 capsule (100 mg total) by mouth 2 (two) times daily.   furosemide 40 MG tablet Commonly known as: LASIX Take 1 tablet by mouth twice daily What changed: when to take this   gabapentin 300 MG capsule Commonly known as: NEURONTIN Take 1 capsule (300 mg total) by mouth at bedtime.   ibuprofen 200 MG tablet Commonly known as: ADVIL Take 400 mg by mouth every 8 (eight) hours as needed (for pain).   ipratropium 0.02 % nebulizer solution Commonly known as: ATROVENT Take 2.5 mLs (0.5 mg total) by nebulization 3 (three) times daily for 14 days.   levalbuterol 0.63 MG/3ML nebulizer solution Commonly known  as: XOPENEX Take 3 mLs (0.63 mg total) by nebulization 3 (three) times daily for 14 days.   losartan 50 MG tablet Commonly known as: COZAAR Take 1 tablet by mouth once daily   morphine CONCENTRATE 10 MG/0.5ML Soln concentrated solution Take 0.25 mLs (5 mg total) by mouth every 4 (four) hours for 5 days.   Narcan 4 MG/0.1ML Liqd nasal spray kit Generic drug: naloxone Place 1 spray into the nose once.   oxymetazoline 0.05 % nasal spray Commonly known as: AFRIN Place 1 spray into both nostrils at bedtime.   pantoprazole 40 MG tablet Commonly known as: PROTONIX Take 1 tablet by mouth once daily   PRESERVISION AREDS 2 PO Take 1 tablet by mouth 2 (two) times daily.   Systane 0.4-0.3 % Soln Generic drug: Polyethyl Glycol-Propyl Glycol Place 1 drop into both eyes 3 (three) times daily as needed (dry/irritated eyes.).   timolol 0.5 % ophthalmic solution Commonly known as: TIMOPTIC Place 1 drop into both eyes 2 (two) times daily.   Travatan Z 0.004 % Soln ophthalmic solution Generic drug: Travoprost (BAK Free) Place 1 drop into both eyes at bedtime.   Tussin 100 MG/5ML syrup Generic drug: guaifenesin Take 200 mg by mouth 3 (three) times daily as needed for cough.   vitamin B-12 1000  MCG tablet Commonly known as: CYANOCOBALAMIN Take 2,000 mcg by mouth daily at 3 pm.       Allergies  Allergen Reactions  . Temazepam Other (See Comments)    Comatose requiring Narcan   Consultations:  Palliative Care  Procedures/Studies: DG Chest 1 View  Result Date: 12/10/2019 CLINICAL DATA:  Dyspnea EXAM: CHEST  1 VIEW COMPARISON:  12/07/2019 FINDINGS: Cardiac shadow is enlarged but stable. Aortic calcifications are again seen. Changes of prior fusion in the midthoracic spine are noted. Persistent bibasilar opacities are noted right slightly greater than left. No sizable effusion is seen. No bony abnormality is noted. IMPRESSION: Persistent bibasilar opacities as described.  Electronically Signed   By: Inez Catalina M.D.   On: 12/10/2019 09:48   DG Chest Portable 1 View  Result Date: 12/07/2019 CLINICAL DATA:  Shortness of breath. EXAM: PORTABLE CHEST 1 VIEW COMPARISON:  June 13, 2019 FINDINGS: Mild diffuse chronic appearing increased lung markings are seen. Mild to moderate severity areas of atelectasis and/or infiltrate are seen within the bilateral lung bases, right greater than left. There is no evidence of a pleural effusion or pneumothorax. The cardiac silhouette is moderately enlarged. There is marked severity calcification of the aortic arch. Bilateral pedicle screws are seen within the midthoracic spine. This represents a new finding when compared to the prior exam. IMPRESSION: 1. Chronic appearing increased lung markings with mild to moderate severity bibasilar atelectasis and/or infiltrate, right greater than left. 2. Interval mid thoracic spine fusion. Electronically Signed   By: Virgina Norfolk M.D.   On: 12/07/2019 16:54      Subjective: "I feel better today."  Discharge Exam: Vitals:   12/13/19 0627 12/13/19 1029  BP: 108/61   Pulse: 94   Resp: 18   Temp: 98 F (36.7 C)   SpO2: 95% 90%   Vitals:   12/12/19 2000 12/12/19 2020 12/13/19 0627 12/13/19 1029  BP:   108/61   Pulse:   94   Resp:   18   Temp:   98 F (36.7 C)   TempSrc:   Oral   SpO2: 92% 90% 95% 90%  Weight:      Height:        General: 79 y.o. female resting in bed in NAD Cardiovascular: RRR, +S1, S2, no m/g/r, equal pulses throughout Respiratory: slight b/l basilar wheeze, normal WOB on 9L HFNC GI: BS+, NDNT, no masses noted, no organomegaly noted MSK: No e/c/c Neuro: A&O x 3, no focal deficits Psyc: Appropriate interaction and affect, calm/cooperative   The results of significant diagnostics from this hospitalization (including imaging, microbiology, ancillary and laboratory) are listed below for reference.     Microbiology: Recent Results (from the past 240  hour(s))  SARS Coronavirus 2 by RT PCR (hospital order, performed in [facility] hospital lab) Nasopharyngeal Nasopharyngeal Swab     Status: None   Collection Time: 12/07/19  4:17 PM   Specimen: Nasopharyngeal Swab  Result Value Ref Range Status   SARS Coronavirus 2 NEGATIVE NEGATIVE Final    Comment: (NOTE) SARS-CoV-2 target nucleic acids are NOT DETECTED. The SARS-CoV-2 RNA is generally detectable in upper and lower respiratory specimens during the acute phase of infection. The lowest concentration of SARS-CoV-2 viral copies this assay can detect is 250 copies / mL. A negative result does not preclude SARS-CoV-2 infection and should not be used as the sole basis for treatment or other patient management decisions.  A negative result may occur with improper specimen collection / handling,  submission of specimen other than nasopharyngeal swab, presence of viral mutation(s) within the areas targeted by this assay, and inadequate number of viral copies (<250 copies / mL). A negative result must be combined with clinical observations, patient history, and epidemiological information. Fact Sheet for Patients:   StrictlyIdeas.no Fact Sheet for Healthcare Providers: BankingDealers.co.za This test is not yet approved or cleared  by the [place] FDA and has been authorized for detection and/or diagnosis of SARS-CoV-2 by FDA under an Emergency Use Authorization (EUA).  This EUA will remain in effect (meaning this test can be used) for the duration of the COVID-19 declaration under Section 564(b)(1) of the Act, 21 U.S.C. section 360bbb-3(b)(1), unless the authorization is terminated or revoked sooner. Performed at [facility], [place] [address] [place], [place] [address]   Culture, blood (routine x 2) Call MD if unable to obtain prior to antibiotics being given     Status: None   Collection Time: 12/08/19 12:55 AM    Specimen: BLOOD  Result Value Ref Range Status   Specimen Description   Final    BLOOD RIGHT WRIST Performed at [facility] [address], [place], [place] [address]    Special Requests   Final    BOTTLES DRAWN AEROBIC AND ANAEROBIC Blood Culture adequate volume Performed at [facility] [address], [place], [place] [address]    Culture   Final    NO GROWTH 5 DAYS Performed at [facility], [place] [address] [place], [place] [address]    Report Status 12/13/2019 FINAL  Final     Labs: BNP (last 3 results) No results for input(s): BNP in the last 8760 hours. Basic Metabolic Panel: Recent Labs  Lab 12/08/19 0446 12/09/19 0540 12/11/19 0645 12/12/19 0552 12/13/19 0925  NA 133* 138 136 135 135  K 4.6 4.4 4.2 3.5 3.4*  CL 101 106 98 97* 97*  CO2 21* 21* 25 25 26   GLUCOSE 166* 119* 118* 110* 127*  BUN 12 12 14 15 18   CREATININE 0.59 0.45 0.63 0.48 0.56  CALCIUM 8.6* 8.9 8.8* 8.8* 8.7*  PHOS  --   --   --   --  4.1   Liver Function Tests: Recent Labs  Lab 12/07/19 1617 12/08/19 0446 12/13/19 0925  AST 21 15  --   ALT 17 17  --   ALKPHOS 231* 209*  --   BILITOT 0.8 0.6  --   PROT 6.9 6.7  --   ALBUMIN 2.6* 2.5* 2.3*   No results for input(s): LIPASE, AMYLASE in the last 168 hours. No results for input(s): AMMONIA in the last 168 hours. CBC: Recent Labs  Lab 12/07/19 1617 12/08/19 0055 12/08/19 0446 12/09/19 0540 12/11/19 0645 12/12/19 0552 12/13/19 0925  WBC 11.8*   < > 10.3 13.9* 12.2* 11.7* 11.0*  NEUTROABS 9.6*  --   --  11.6* 9.4* 8.9* 8.4*  HGB 10.8*   < > 10.5* 9.8* 10.1* 10.2* 10.0*  HCT 34.2*   < > 32.8* 30.9* 32.6* 32.8* 31.7*  MCV 96.6   < > 96.8 96.9 98.5 97.0 97.8  PLT 412*   < > 431* 417* 381 361 361   < > = values in this interval not displayed.   Cardiac Enzymes: No results for input(s): CKTOTAL, CKMB, CKMBINDEX, TROPONINI in the last 168 hours. BNP: Invalid input(s): POCBNP CBG: No  results for input(s): GLUCAP in the last 168 hours. D-Dimer No results for  input(s): DDIMER in the last 72 hours. Hgb A1c No results for input(s): HGBA1C in the last 72 hours. Lipid Profile No results for input(s): CHOL, HDL, LDLCALC, TRIG, CHOLHDL, LDLDIRECT in the last 72 hours. Thyroid function studies No results for input(s): TSH, T4TOTAL, T3FREE, THYROIDAB in the last 72 hours.  Invalid input(s): FREET3 Anemia work up No results for input(s): VITAMINB12, FOLATE, FERRITIN, TIBC, IRON, RETICCTPCT in the last 72 hours. Urinalysis    Component Value Date/Time   COLORURINE AMBER (A) 06/13/2019 1040   APPEARANCEUR CLOUDY (A) 06/13/2019 1040   LABSPEC 1.018 06/13/2019 1040   PHURINE 6.0 06/13/2019 1040   GLUCOSEU NEGATIVE 06/13/2019 1040   HGBUR NEGATIVE 06/13/2019 1040   BILIRUBINUR NEGATIVE 06/13/2019 1040   BILIRUBINUR negative 01/23/2019 [place] 06/13/2019 1040   PROTEINUR 30 (A) 06/13/2019 1040   UROBILINOGEN 0.2 01/23/2019 1136   NITRITE NEGATIVE 06/13/2019 1040   LEUKOCYTESUR LARGE (A) 06/13/2019 1040   Sepsis Labs Invalid input(s): PROCALCITONIN,  WBC,  LACTICIDVEN Microbiology Recent Results (from the past 240 hour(s))  SARS Coronavirus 2 by RT PCR (hospital order, performed in [place] [facility] hospital lab) Nasopharyngeal Nasopharyngeal Swab     Status: None   Collection Time: 12/07/19  4:17 PM   Specimen: Nasopharyngeal Swab  Result Value Ref Range Status   SARS Coronavirus 2 NEGATIVE NEGATIVE Final    Comment: (NOTE) SARS-CoV-2 target nucleic acids are NOT DETECTED. The SARS-CoV-2 RNA is generally detectable in upper and lower respiratory specimens during the acute phase of infection. The lowest concentration of SARS-CoV-2 viral copies this assay can detect is 250 copies / mL. A negative result does not preclude SARS-CoV-2 infection and should not be used as the sole basis for treatment or other patient management decisions.  A negative result may  occur with improper specimen collection / handling, submission of specimen other than nasopharyngeal swab, presence of viral mutation(s) within the areas targeted by this assay, and inadequate number of viral copies (<250 copies / mL). A negative result must be combined with clinical observations, patient history, and epidemiological information. Fact Sheet for Patients:   StrictlyIdeas.no Fact Sheet for Healthcare Providers: BankingDealers.co.za This test is not yet approved or cleared  by the [place] FDA and has been authorized for detection and/or diagnosis of SARS-CoV-2 by FDA under an Emergency Use Authorization (EUA).  This EUA will remain in effect (meaning this test can be used) for the duration of the COVID-19 declaration under Section 564(b)(1) of the Act, 21 U.S.C. section 360bbb-3(b)(1), unless the authorization is terminated or revoked sooner. Performed at [facility], [place] [address], [place], [place] [address]   Culture, blood (routine x 2) Call MD if unable to obtain prior to antibiotics being given     Status: None   Collection Time: 12/08/19 12:55 AM   Specimen: BLOOD  Result Value Ref Range Status   Specimen Description   Final    BLOOD RIGHT WRIST Performed at [facility] [address] [place], [place] [address]    Special Requests   Final    BOTTLES DRAWN AEROBIC AND ANAEROBIC Blood Culture adequate volume Performed at [facility] [address], [place], [place] [address]    Culture   Final    NO GROWTH 5 DAYS Performed at [facility], [place] [address], [place], [place] [address]    Report Status 12/13/2019 FINAL  Final     Time coordinating discharge: 35 minutes  SIGNED:   Jonnie Finner, DO  [facility] 12/13/2019, 1:02 PM   If 7PM-7AM, please contact night-coverage [email or web address]

## 2019-12-14 ENCOUNTER — Encounter (HOSPITAL_COMMUNITY): Payer: Self-pay | Admitting: Emergency Medicine

## 2019-12-14 ENCOUNTER — Other Ambulatory Visit: Payer: Self-pay

## 2019-12-14 ENCOUNTER — Emergency Department (HOSPITAL_COMMUNITY): Payer: Medicare Other

## 2019-12-14 ENCOUNTER — Inpatient Hospital Stay (HOSPITAL_COMMUNITY)
Admission: AD | Admit: 2019-12-14 | Discharge: 2019-12-17 | DRG: 291 | Disposition: A | Discharge status: Hospice | Payer: Medicare Other | Attending: Family Medicine | Admitting: Family Medicine

## 2019-12-14 DIAGNOSIS — E785 Hyperlipidemia, unspecified: Secondary | ICD-10-CM | POA: Diagnosis present

## 2019-12-14 DIAGNOSIS — J90 Pleural effusion, not elsewhere classified: Secondary | ICD-10-CM

## 2019-12-14 DIAGNOSIS — G8929 Other chronic pain: Secondary | ICD-10-CM | POA: Diagnosis present

## 2019-12-14 DIAGNOSIS — D630 Anemia in neoplastic disease: Secondary | ICD-10-CM | POA: Diagnosis present

## 2019-12-14 DIAGNOSIS — I5033 Acute on chronic diastolic (congestive) heart failure: Secondary | ICD-10-CM | POA: Diagnosis present

## 2019-12-14 DIAGNOSIS — Z85828 Personal history of other malignant neoplasm of skin: Secondary | ICD-10-CM

## 2019-12-14 DIAGNOSIS — Z515 Encounter for palliative care: Secondary | ICD-10-CM | POA: Diagnosis not present

## 2019-12-14 DIAGNOSIS — E669 Obesity, unspecified: Secondary | ICD-10-CM | POA: Diagnosis present

## 2019-12-14 DIAGNOSIS — Z833 Family history of diabetes mellitus: Secondary | ICD-10-CM

## 2019-12-14 DIAGNOSIS — Z8249 Family history of ischemic heart disease and other diseases of the circulatory system: Secondary | ICD-10-CM

## 2019-12-14 DIAGNOSIS — Z87891 Personal history of nicotine dependence: Secondary | ICD-10-CM

## 2019-12-14 DIAGNOSIS — J810 Acute pulmonary edema: Secondary | ICD-10-CM | POA: Diagnosis not present

## 2019-12-14 DIAGNOSIS — A419 Sepsis, unspecified organism: Secondary | ICD-10-CM | POA: Diagnosis not present

## 2019-12-14 DIAGNOSIS — R339 Retention of urine, unspecified: Secondary | ICD-10-CM | POA: Diagnosis not present

## 2019-12-14 DIAGNOSIS — N179 Acute kidney failure, unspecified: Secondary | ICD-10-CM | POA: Diagnosis present

## 2019-12-14 DIAGNOSIS — H3530 Unspecified macular degeneration: Secondary | ICD-10-CM | POA: Diagnosis present

## 2019-12-14 DIAGNOSIS — H409 Unspecified glaucoma: Secondary | ICD-10-CM | POA: Diagnosis present

## 2019-12-14 DIAGNOSIS — I9589 Other hypotension: Secondary | ICD-10-CM | POA: Diagnosis present

## 2019-12-14 DIAGNOSIS — K219 Gastro-esophageal reflux disease without esophagitis: Secondary | ICD-10-CM | POA: Diagnosis present

## 2019-12-14 DIAGNOSIS — Z90710 Acquired absence of both cervix and uterus: Secondary | ICD-10-CM

## 2019-12-14 DIAGNOSIS — Z981 Arthrodesis status: Secondary | ICD-10-CM

## 2019-12-14 DIAGNOSIS — J188 Other pneumonia, unspecified organism: Secondary | ICD-10-CM | POA: Diagnosis not present

## 2019-12-14 DIAGNOSIS — I447 Left bundle-branch block, unspecified: Secondary | ICD-10-CM | POA: Diagnosis present

## 2019-12-14 DIAGNOSIS — J9601 Acute respiratory failure with hypoxia: Secondary | ICD-10-CM | POA: Diagnosis not present

## 2019-12-14 DIAGNOSIS — C3490 Malignant neoplasm of unspecified part of unspecified bronchus or lung: Secondary | ICD-10-CM | POA: Diagnosis present

## 2019-12-14 DIAGNOSIS — M353 Polymyalgia rheumatica: Secondary | ICD-10-CM | POA: Diagnosis present

## 2019-12-14 DIAGNOSIS — C7951 Secondary malignant neoplasm of bone: Secondary | ICD-10-CM | POA: Diagnosis present

## 2019-12-14 DIAGNOSIS — R0603 Acute respiratory distress: Secondary | ICD-10-CM

## 2019-12-14 DIAGNOSIS — R404 Transient alteration of awareness: Secondary | ICD-10-CM | POA: Diagnosis not present

## 2019-12-14 DIAGNOSIS — Z853 Personal history of malignant neoplasm of breast: Secondary | ICD-10-CM

## 2019-12-14 DIAGNOSIS — Z803 Family history of malignant neoplasm of breast: Secondary | ICD-10-CM

## 2019-12-14 DIAGNOSIS — Z20822 Contact with and (suspected) exposure to covid-19: Secondary | ICD-10-CM | POA: Diagnosis not present

## 2019-12-14 DIAGNOSIS — Z820 Family history of epilepsy and other diseases of the nervous system: Secondary | ICD-10-CM

## 2019-12-14 DIAGNOSIS — E872 Acidosis: Secondary | ICD-10-CM | POA: Diagnosis present

## 2019-12-14 DIAGNOSIS — I959 Hypotension, unspecified: Secondary | ICD-10-CM | POA: Diagnosis present

## 2019-12-14 DIAGNOSIS — Z8544 Personal history of malignant neoplasm of other female genital organs: Secondary | ICD-10-CM

## 2019-12-14 DIAGNOSIS — I4891 Unspecified atrial fibrillation: Secondary | ICD-10-CM | POA: Diagnosis not present

## 2019-12-14 DIAGNOSIS — Z800 Family history of malignant neoplasm of digestive organs: Secondary | ICD-10-CM

## 2019-12-14 DIAGNOSIS — Z791 Long term (current) use of non-steroidal anti-inflammatories (NSAID): Secondary | ICD-10-CM

## 2019-12-14 DIAGNOSIS — Z66 Do not resuscitate: Secondary | ICD-10-CM | POA: Diagnosis present

## 2019-12-14 DIAGNOSIS — Z823 Family history of stroke: Secondary | ICD-10-CM

## 2019-12-14 DIAGNOSIS — J80 Acute respiratory distress syndrome: Secondary | ICD-10-CM | POA: Diagnosis not present

## 2019-12-14 DIAGNOSIS — H548 Legal blindness, as defined in USA: Secondary | ICD-10-CM | POA: Diagnosis present

## 2019-12-14 DIAGNOSIS — Z79899 Other long term (current) drug therapy: Secondary | ICD-10-CM

## 2019-12-14 DIAGNOSIS — Z9889 Other specified postprocedural states: Secondary | ICD-10-CM

## 2019-12-14 DIAGNOSIS — Z96643 Presence of artificial hip joint, bilateral: Secondary | ICD-10-CM | POA: Diagnosis present

## 2019-12-14 DIAGNOSIS — E1165 Type 2 diabetes mellitus with hyperglycemia: Secondary | ICD-10-CM | POA: Diagnosis not present

## 2019-12-14 DIAGNOSIS — Z85118 Personal history of other malignant neoplasm of bronchus and lung: Secondary | ICD-10-CM

## 2019-12-14 DIAGNOSIS — J9602 Acute respiratory failure with hypercapnia: Secondary | ICD-10-CM | POA: Diagnosis present

## 2019-12-14 DIAGNOSIS — C801 Malignant (primary) neoplasm, unspecified: Secondary | ICD-10-CM | POA: Diagnosis not present

## 2019-12-14 DIAGNOSIS — I110 Hypertensive heart disease with heart failure: Secondary | ICD-10-CM | POA: Diagnosis not present

## 2019-12-14 DIAGNOSIS — Z6829 Body mass index (BMI) 29.0-29.9, adult: Secondary | ICD-10-CM

## 2019-12-14 LAB — CBC WITH DIFFERENTIAL/PLATELET
Abs Immature Granulocytes: 0.48 10*3/uL — ABNORMAL HIGH (ref 0.00–0.07)
Basophils Absolute: 0.1 10*3/uL (ref 0.0–0.1)
Basophils Relative: 1 %
Eosinophils Absolute: 0.3 10*3/uL (ref 0.0–0.5)
Eosinophils Relative: 2 %
HCT: 34.9 % — ABNORMAL LOW (ref 36.0–46.0)
Hemoglobin: 10.6 g/dL — ABNORMAL LOW (ref 12.0–15.0)
Immature Granulocytes: 3 %
Lymphocytes Relative: 7 %
Lymphs Abs: 1 10*3/uL (ref 0.7–4.0)
MCH: 30.2 pg (ref 26.0–34.0)
MCHC: 30.4 g/dL (ref 30.0–36.0)
MCV: 99.4 fL (ref 80.0–100.0)
Monocytes Absolute: 1.5 10*3/uL — ABNORMAL HIGH (ref 0.1–1.0)
Monocytes Relative: 10 %
Neutro Abs: 11.8 10*3/uL — ABNORMAL HIGH (ref 1.7–7.7)
Neutrophils Relative %: 77 %
Platelets: 415 10*3/uL — ABNORMAL HIGH (ref 150–400)
RBC: 3.51 MIL/uL — ABNORMAL LOW (ref 3.87–5.11)
RDW: 15.1 % (ref 11.5–15.5)
WBC: 15.1 10*3/uL — ABNORMAL HIGH (ref 4.0–10.5)
nRBC: 0 % (ref 0.0–0.2)

## 2019-12-14 LAB — I-STAT ARTERIAL BLOOD GAS, ED
Acid-Base Excess: 0 mmol/L (ref 0.0–2.0)
Acid-Base Excess: 2 mmol/L (ref 0.0–2.0)
Bicarbonate: 27.1 mmol/L (ref 20.0–28.0)
Bicarbonate: 27.8 mmol/L (ref 20.0–28.0)
Calcium, Ion: 1.2 mmol/L (ref 1.15–1.40)
Calcium, Ion: 1.21 mmol/L (ref 1.15–1.40)
HCT: 32 % — ABNORMAL LOW (ref 36.0–46.0)
HCT: 30 % — ABNORMAL LOW (ref 36.0–46.0)
Hemoglobin: 10.9 g/dL — ABNORMAL LOW (ref 12.0–15.0)
Hemoglobin: 10.2 g/dL — ABNORMAL LOW (ref 12.0–15.0)
O2 Saturation: 99 %
O2 Saturation: 89 %
Patient temperature: 98
Patient temperature: 98.6
Potassium: 4.1 mmol/L (ref 3.5–5.1)
Potassium: 4.4 mmol/L (ref 3.5–5.1)
Sodium: 132 mmol/L — ABNORMAL LOW (ref 135–145)
Sodium: 134 mmol/L — ABNORMAL LOW (ref 135–145)
TCO2: 29 mmol/L (ref 22–32)
TCO2: 29 mmol/L (ref 22–32)
pCO2 arterial: 51.5 mmHg — ABNORMAL HIGH (ref 32.0–48.0)
pCO2 arterial: 46.7 mmHg (ref 32.0–48.0)
pH, Arterial: 7.327 — ABNORMAL LOW (ref 7.350–7.450)
pH, Arterial: 7.382 (ref 7.350–7.450)
pO2, Arterial: 180 mmHg — ABNORMAL HIGH (ref 83.0–108.0)
pO2, Arterial: 59 mmHg — ABNORMAL LOW (ref 83.0–108.0)

## 2019-12-14 LAB — COMPREHENSIVE METABOLIC PANEL
ALT: 18 U/L (ref 0–44)
AST: 31 U/L (ref 15–41)
Albumin: 2.1 g/dL — ABNORMAL LOW (ref 3.5–5.0)
Alkaline Phosphatase: 257 U/L — ABNORMAL HIGH (ref 38–126)
Anion gap: 14 (ref 5–15)
BUN: 19 mg/dL (ref 8–23)
CO2: 19 mmol/L — ABNORMAL LOW (ref 22–32)
Calcium: 8.7 mg/dL — ABNORMAL LOW (ref 8.9–10.3)
Chloride: 101 mmol/L (ref 98–111)
Creatinine, Ser: 1.03 mg/dL — ABNORMAL HIGH (ref 0.44–1.00)
GFR calc Af Amer: 59 mL/min — ABNORMAL LOW (ref >60)
GFR calc non Af Amer: 51 mL/min — ABNORMAL LOW (ref >60)
Glucose, Bld: 226 mg/dL — ABNORMAL HIGH (ref 70–99)
Potassium: 4.6 mmol/L (ref 3.5–5.1)
Sodium: 134 mmol/L — ABNORMAL LOW (ref 135–145)
Total Bilirubin: 0.8 mg/dL (ref 0.3–1.2)
Total Protein: 6.3 g/dL — ABNORMAL LOW (ref 6.5–8.1)

## 2019-12-14 LAB — SARS CORONAVIRUS 2 BY RT PCR (HOSPITAL ORDER, PERFORMED IN ~~LOC~~ HOSPITAL LAB): SARS Coronavirus 2: NEGATIVE

## 2019-12-14 LAB — LACTIC ACID, PLASMA: Lactic Acid, Venous: 1 mmol/L (ref 0.5–1.9)

## 2019-12-14 LAB — POC SARS CORONAVIRUS 2 AG -  ED: SARS Coronavirus 2 Ag: NEGATIVE

## 2019-12-14 LAB — BRAIN NATRIURETIC PEPTIDE: B Natriuretic Peptide: 544.9 pg/mL — ABNORMAL HIGH (ref 0.0–100.0)

## 2019-12-14 LAB — TROPONIN I (HIGH SENSITIVITY)
Troponin I (High Sensitivity): 16 ng/L (ref <18)
Troponin I (High Sensitivity): 24 ng/L — ABNORMAL HIGH (ref <18)

## 2019-12-14 MED ORDER — FUROSEMIDE 10 MG/ML IJ SOLN: 40.0000 mg | Freq: Once | INTRAMUSCULAR | Status: DC

## 2019-12-14 MED ORDER — SODIUM CHLORIDE 0.9 % IV SOLN
500.0000 mg | INTRAVENOUS | Status: DC
  Filled 2019-12-14: qty 500

## 2019-12-14 MED ORDER — DIPHENHYDRAMINE HCL 12.5 MG/5ML PO ELIX: 12.5000 mg | ORAL_SOLUTION | Freq: Two times a day (BID) | ORAL | Status: DC | PRN

## 2019-12-14 MED ORDER — LEVALBUTEROL HCL 0.63 MG/3ML IN NEBU
0.6300 mg | INHALATION_SOLUTION | Freq: Three times a day (TID) | RESPIRATORY_TRACT | Status: DC
  Administered 2019-12-14 – 2019-12-17 (×10): 0.63 mg via RESPIRATORY_TRACT
  Filled 2019-12-14 (×11): qty 3

## 2019-12-14 MED ORDER — CYCLOBENZAPRINE HCL 10 MG PO TABS
10.0000 mg | ORAL_TABLET | Freq: Three times a day (TID) | ORAL | Status: DC | PRN
  Administered 2019-12-16: 10 mg via ORAL
  Filled 2019-12-14: qty 1

## 2019-12-14 MED ORDER — ACETAMINOPHEN 650 MG RE SUPP: 650.0000 mg | Freq: Four times a day (QID) | RECTAL | Status: DC | PRN

## 2019-12-14 MED ORDER — MORPHINE SULFATE (CONCENTRATE) 10 MG/0.5ML PO SOLN
5.0000 mg | ORAL | Status: DC
  Administered 2019-12-14 (×2): 5 mg via ORAL
  Filled 2019-12-14 (×2): qty 0.5

## 2019-12-14 MED ORDER — SODIUM CHLORIDE 0.9 % IV SOLN
2.0000 g | Freq: Two times a day (BID) | INTRAVENOUS | Status: DC
  Administered 2019-12-14 – 2019-12-17 (×6): 2 g via INTRAVENOUS
  Filled 2019-12-14 (×7): qty 2

## 2019-12-14 MED ORDER — ENOXAPARIN SODIUM 40 MG/0.4ML ~~LOC~~ SOLN
40.0000 mg | SUBCUTANEOUS | Status: DC
  Administered 2019-12-14 – 2019-12-17 (×4): 40 mg via SUBCUTANEOUS
  Filled 2019-12-14 (×4): qty 0.4

## 2019-12-14 MED ORDER — NALOXONE HCL 0.4 MG/ML IJ SOLN: 0.4000 mg | INTRAMUSCULAR | Status: DC | PRN

## 2019-12-14 MED ORDER — VANCOMYCIN HCL IN DEXTROSE 1-5 GM/200ML-% IV SOLN
1000.0000 mg | Freq: Once | INTRAVENOUS | Status: AC
  Administered 2019-12-14: 1000 mg via INTRAVENOUS
  Filled 2019-12-14: qty 200

## 2019-12-14 MED ORDER — CYANOCOBALAMIN 1000 MCG/ML IJ SOLN
1000.0000 ug | Freq: Once | INTRAMUSCULAR | Status: AC
  Administered 2019-12-14: 1000 ug via INTRAMUSCULAR
  Filled 2019-12-14: qty 1

## 2019-12-14 MED ORDER — EMETROL 1.87-1.87-21.5 PO SOLN: 10.0000 mL | ORAL | Status: DC | PRN

## 2019-12-14 MED ORDER — VANCOMYCIN HCL 500 MG/100ML IV SOLN
500.0000 mg | Freq: Once | INTRAVENOUS | Status: AC
  Administered 2019-12-14: 500 mg via INTRAVENOUS
  Filled 2019-12-14: qty 100

## 2019-12-14 MED ORDER — SODIUM CHLORIDE 0.9% FLUSH
3.0000 mL | Freq: Two times a day (BID) | INTRAVENOUS | Status: DC
  Administered 2019-12-14 – 2019-12-17 (×7): 3 mL via INTRAVENOUS

## 2019-12-14 MED ORDER — ACETAMINOPHEN 500 MG PO TABS
1000.0000 mg | ORAL_TABLET | Freq: Once | ORAL | Status: AC
  Administered 2019-12-14: 1000 mg via ORAL
  Filled 2019-12-14: qty 2

## 2019-12-14 MED ORDER — DOCUSATE SODIUM 100 MG PO CAPS
100.0000 mg | ORAL_CAPSULE | Freq: Two times a day (BID) | ORAL | Status: DC
  Administered 2019-12-14 – 2019-12-17 (×5): 100 mg via ORAL
  Filled 2019-12-14 (×5): qty 1

## 2019-12-14 MED ORDER — IPRATROPIUM BROMIDE 0.02 % IN SOLN
0.5000 mg | Freq: Three times a day (TID) | RESPIRATORY_TRACT | Status: DC
  Administered 2019-12-14 – 2019-12-17 (×10): 0.5 mg via RESPIRATORY_TRACT
  Filled 2019-12-14 (×11): qty 2.5

## 2019-12-14 MED ORDER — VANCOMYCIN HCL 750 MG/150ML IV SOLN
750.0000 mg | Freq: Two times a day (BID) | INTRAVENOUS | Status: DC
  Administered 2019-12-14 – 2019-12-15 (×3): 750 mg via INTRAVENOUS
  Filled 2019-12-14 (×4): qty 150

## 2019-12-14 MED ORDER — OXYMETAZOLINE HCL 0.05 % NA SOLN
1.0000 | Freq: Every day | NASAL | Status: DC
  Administered 2019-12-14 – 2019-12-16 (×3): 1 via NASAL
  Filled 2019-12-14 (×2): qty 30

## 2019-12-14 MED ORDER — SODIUM CHLORIDE 0.9 % IV SOLN
2.0000 g | INTRAVENOUS | Status: AC
  Administered 2019-12-14: 2 g via INTRAVENOUS
  Filled 2019-12-14: qty 2

## 2019-12-14 MED ORDER — TIMOLOL MALEATE 0.5 % OP SOLN
1.0000 [drp] | Freq: Two times a day (BID) | OPHTHALMIC | Status: DC
  Administered 2019-12-14 – 2019-12-17 (×7): 1 [drp] via OPHTHALMIC
  Filled 2019-12-14 (×2): qty 5

## 2019-12-14 MED ORDER — NALOXONE HCL 4 MG/0.1ML NA LIQD: 1.0000 | NASAL | Status: DC | PRN

## 2019-12-14 MED ORDER — FUROSEMIDE 10 MG/ML IJ SOLN
40.0000 mg | Freq: Once | INTRAMUSCULAR | Status: AC
  Administered 2019-12-14: 40 mg via INTRAVENOUS
  Filled 2019-12-14: qty 4

## 2019-12-14 MED ORDER — POLYVINYL ALCOHOL 1.4 % OP SOLN
1.0000 [drp] | Freq: Three times a day (TID) | OPHTHALMIC | Status: DC | PRN
  Administered 2019-12-14 – 2019-12-16 (×4): 1 [drp] via OPHTHALMIC
  Filled 2019-12-14: qty 15

## 2019-12-14 MED ORDER — SODIUM CHLORIDE 0.9 % IV SOLN: 2.0000 g | Freq: Once | INTRAVENOUS | Status: DC

## 2019-12-14 MED ORDER — MORPHINE SULFATE (CONCENTRATE) 10 MG/0.5ML PO SOLN
5.0000 mg | ORAL | Status: DC
  Administered 2019-12-14: 10 mg via SUBLINGUAL
  Administered 2019-12-14: 5 mg via SUBLINGUAL
  Administered 2019-12-15 – 2019-12-16 (×8): 10 mg via SUBLINGUAL
  Administered 2019-12-17: 5 mg via SUBLINGUAL
  Administered 2019-12-17: 10 mg via SUBLINGUAL
  Filled 2019-12-14 (×13): qty 0.5

## 2019-12-14 MED ORDER — ACETAMINOPHEN 325 MG PO TABS
650.0000 mg | ORAL_TABLET | Freq: Four times a day (QID) | ORAL | Status: DC | PRN
  Administered 2019-12-14 – 2019-12-15 (×2): 650 mg via ORAL
  Filled 2019-12-14 (×2): qty 2

## 2019-12-14 MED ORDER — MAGNESIUM HYDROXIDE 400 MG/5ML PO SUSP
15.0000 mL | Freq: Every day | ORAL | Status: AC
  Administered 2019-12-14 – 2019-12-15 (×2): 15 mL via ORAL
  Filled 2019-12-14 (×2): qty 30

## 2019-12-14 MED ORDER — GUAIFENESIN 100 MG/5ML PO SOLN: 200.0000 mg | Freq: Three times a day (TID) | ORAL | Status: DC | PRN

## 2019-12-14 MED ORDER — LATANOPROST 0.005 % OP SOLN
1.0000 [drp] | Freq: Every day | OPHTHALMIC | Status: DC
  Administered 2019-12-14 – 2019-12-16 (×4): 1 [drp] via OPHTHALMIC
  Filled 2019-12-14: qty 2.5

## 2019-12-14 MED ORDER — KETOROLAC TROMETHAMINE 30 MG/ML IJ SOLN
30.0000 mg | Freq: Once | INTRAMUSCULAR | Status: AC
  Administered 2019-12-14: 30 mg via INTRAVENOUS
  Filled 2019-12-14: qty 1

## 2019-12-14 MED ORDER — GABAPENTIN 300 MG PO CAPS
300.0000 mg | ORAL_CAPSULE | Freq: Every day | ORAL | Status: DC
  Administered 2019-12-14 – 2019-12-16 (×3): 300 mg via ORAL
  Filled 2019-12-14 (×3): qty 1

## 2019-12-14 NOTE — H&P (Signed)
History and Physical    [name]:[id number] DOB: Dec 03, 1940 DOA: 12/14/2019  Referring MD/NP/PA: Inda Merlin, MD PCP: Laurey Morale, MD  Patient coming from: Home Via EMS  Chief Complaint: Couldn't breathe  I have personally briefly reviewed patient's old medical records in [place]   HPI: Connie Lang is a 79 y.o. female with medical history significant of stage IV metastatic adenocarcinoma of the lung s/p palliative radiation therapy, HTN, HLD, OA, and DDD presents with complaints of being unable to catch her breath.  Patient had just been hospitalized from 6/4-6/10 for community-acquired pneumonia discharged home with hospice on 10 L of nasal cannula oxygen.  She had completed treatment of IV Rocephin.  After getting home husband notes that she did not take her afternoon Lasix as it was late in the evening she did not want to be up all night.  They were able to set up the oxygen concentrator, but one of the hoses reportedly came loose.  Patient was likely not receiving oxygen as she.  She reports having a cough and had been able to produce some sputum 2 or 3 times a course of yesterday.  When she was unable to catch her breath they had tried a nebulizer treatment without any improvement.  Noted associated symptoms of breaking out in a sweat.  Denies having any nausea, vomiting, or diarrhea symptoms.  Upon arrival of EMS patient was on a nebulizer with O2 sat durations in the 60s.  Patient received 2 g of magnesium sulfate, DuoNeb, 125 mg of Solu-Medrol, and was placed on CPAP.   ED Course: Patient was found to be febrile up to 100.6 F, respirations 13-25, blood pressure 83/47-112/66.  Patient was initially placed on BiPAP as initial ABG pH of 7.327, PCO2 51.5, and PO2 180.  Labs significant for WBC 15.1, hemoglobin 10.6, sodium 134,  BUN 19, creatinine 1.03, glucose 226, BNP 544.9, troponin 16->24.  Chest x-ray significant for cardiomegaly with small pleural  effusions and interstitial edema.  Patient was given acetaminophen 1000 mg, furosemide 40 mg IV,  ketorolac 30 mg IV,, vancomycin, cefepime,  Review of Systems  Constitutional: Positive for chills and diaphoresis. Negative for fever.  HENT: Positive for congestion. Negative for ear pain.   Eyes: Positive for pain. Negative for photophobia.  Respiratory: Positive for cough, sputum production, shortness of breath and wheezing.   Cardiovascular: Negative for chest pain and leg swelling.  Gastrointestinal: Negative for abdominal pain, blood in stool, nausea and vomiting.  Genitourinary: Negative for dysuria and hematuria.  Musculoskeletal: Positive for myalgias. Negative for falls.  Skin: Negative for itching.  Neurological: Positive for weakness. Negative for loss of consciousness.  Endo/Heme/Allergies: Negative for polydipsia.  Psychiatric/Behavioral: Negative for substance abuse.  All other systems reviewed and are negative.   Past Medical History:  Diagnosis Date  . Allergy    occasionally has hayfever  . Anxiety   . Breast cancer ([facility]) 2017  . Cancer ([facility]) 1987   cancerous cells in vaginal wall seemed to be metastatic from the gut but no primary was ever found   . Cataract   . Chicken pox   . Depression   . DJD (degenerative joint disease)   . First degree AV block   . GERD (gastroesophageal reflux disease)   . Glaucoma    glaucoma and cataracts sees Dr Satira Sark   . Hyperlipemia   . Hypertension    pt denies currently. " Prednisone made it high."  . LBBB (  left bundle branch block)   . Lumbar disc disease    sees Dr. Sherwood Gambler   . Macular degeneration   . Mobitz type 1 second degree atrioventricular block   . Osteoarthritis   . Osteopenia   . Personal history of radiation therapy   . PMR (polymyalgia rheumatica) (HCC)    sees Dr. Leigh Aurora   . Radiation    02-25-16 last radiation for breast cancer  . Transfusion history    blood     Past Surgical History:    Procedure Laterality Date  . ABDOMINAL HYSTERECTOMY     TAH BSO 1985  . APPENDECTOMY    . BASAL CELL CARCINOMA EXCISION     nose and rt thigh  . BREAST BIOPSY      x 4  . BREAST EXCISIONAL BIOPSY Right 1997  . BREAST EXCISIONAL BIOPSY Left 1985  . BREAST EXCISIONAL BIOPSY Left 1965  . BREAST EXCISIONAL BIOPSY Right 1959  . BREAST LUMPECTOMY Right 12/25/2015  . BREAST LUMPECTOMY  02/27/2018   see report  . BREAST LUMPECTOMY WITH RADIOACTIVE SEED LOCALIZATION Right 12/25/2015   Procedure: RIGHT BREAST LUMPECTOMY WITH RADIOACTIVE SEED LOCALIZATION;  Surgeon: Rolm Bookbinder, MD;  Location: [facility];  Service: General;  Laterality: Right;  . CATARACT EXTRACTION     01-27-2011 left, 02-17-2011 right   . CERVICAL LAMINECTOMY     1983  . cervical microdiskectomy     1990 Dr Jovita Gamma  . CESAREAN SECTION    . COLONOSCOPY  03/21/2018   per Dr. Silverio Decamp, adenomatous polyps, no repeats due to age   . EYE SURGERY     bil cataracts with lens implant  . FOOT ARTHRODESIS, MODIFIED MCBRIDE Right   . LUMBAR LAMINECTOMY     1990 Dr Jovita Gamma  . MYELOGRAM    . POLYPECTOMY    . REFRACTIVE SURGERY     01-29-2009 04-07-2009 Dr Foye Clock in [place]   . spinal injection     07-30-2012, 09-24-2012  . TENDON REPAIR Right    rt wrist  . TOTAL HIP ARTHROPLASTY Right 05/13/2016   Procedure: RIGHT TOTAL HIP ARTHROPLASTY ANTERIOR APPROACH;  Surgeon: Rod Can, MD;  Location: [facility];  Service: Orthopedics;  Laterality: Right;  [place] RNFA  . TOTAL HIP ARTHROPLASTY Left 11/11/2016   Procedure: LEFT TOTAL HIP ARTHROPLASTY ANTERIOR APPROACH;  Surgeon: Rod Can, MD;  Location: [facility];  Service: Orthopedics;  Laterality: Left;  Dr. requesting RNFA  . [place]     x3  . WRIST SURGERY Right    tendon repair     reports that she quit smoking about 25 years ago. Her smoking use included cigarettes. She has a 18.00 pack-year smoking history. She  has never used smokeless tobacco. She reports current alcohol use of about 7.0 standard drinks of alcohol per week. She reports that she does not use drugs.  Allergies  Allergen Reactions  . Temazepam Other (See Comments)    Comatose requiring Narcan    Family History  Problem Relation Age of Onset  . Alzheimer's disease Father   . Heart failure Father        d. 31  . Stroke Mother 61  . Cancer Sister        ovarian  . Alzheimer's disease Paternal Aunt   . Breast cancer Paternal Aunt   . Colon cancer Brother        dx. 10s  . Stroke Maternal Aunt  d. 60s  . Heart disease Maternal Grandfather   . Diabetes Maternal Grandfather   . Stroke Paternal Grandmother 73  . Heart attack Paternal Grandfather        d. 64s  . Ovarian cancer Sister        dx. 40s; no genetic testing  . Breast cancer Daughter   . Leukemia Grandchild 21  . Skin cancer Grandchild   . Cervical cancer Cousin 54       maternal 1st cousin  . Alzheimer's disease Paternal Aunt        (x4) paternal aunts  . Heart attack Paternal Uncle 84  . Colon polyps Neg Hx   . Rectal cancer Neg Hx   . Stomach cancer Neg Hx     Prior to Admission medications   Medication Sig Start Date End Date Taking? Authorizing Provider  acetaminophen (TYLENOL) 650 MG CR tablet Take 650-1,300 mg by mouth every 8 (eight) hours as needed for pain.   Yes [provider]  amLODipine (NORVASC) 5 MG tablet TAKE 1 TABLET BY MOUTH ONCE DAILY IN THE EVENING Patient taking differently: Take 5 mg by mouth daily.  09/03/19  Yes Laurey Morale, MD  anti-nausea (EMETROL) solution Take 10 mLs by mouth every 15 (fifteen) minutes as needed for nausea or vomiting.   Yes [provider]  chlorpheniramine (CHLOR-TRIMETON) 4 MG tablet Take 4 mg by mouth daily as needed for allergies.   Yes [provider]  cyclobenzaprine (FLEXERIL) 10 MG tablet Take 1 tablet by mouth three times daily as needed for muscle spasm 09/03/19  Yes  Laurey Morale, MD  diphenhydrAMINE (BENADRYL) 12.5 MG chewable tablet Chew 12.5 mg by mouth 2 (two) times daily as needed (for allergic reaction (lip swelling)).   Yes [provider]  docusate sodium (COLACE) 100 MG capsule Take 1 capsule (100 mg total) by mouth 2 (two) times daily. 12/13/19  Yes Kyle, Tyrone A, DO  furosemide (LASIX) 40 MG tablet Take 1 tablet by mouth twice daily Patient taking differently: Take 40 mg by mouth daily.  06/14/19  Yes Laurey Morale, MD  gabapentin (NEURONTIN) 300 MG capsule Take 1 capsule (300 mg total) by mouth at bedtime. 09/10/19  Yes Nicholas Lose, MD  guaifenesin (TUSSIN) 100 MG/5ML syrup Take 200 mg by mouth 3 (three) times daily as needed for cough.   Yes [provider]  ibuprofen (ADVIL,MOTRIN) 200 MG tablet Take 400 mg by mouth every 8 (eight) hours as needed (for pain).    Yes [provider]  ipratropium (ATROVENT) 0.02 % nebulizer solution Take 2.5 mLs (0.5 mg total) by nebulization 3 (three) times daily for 14 days. 12/13/19 12/27/19 Yes Kyle, Tyrone A, DO  levalbuterol (XOPENEX) 0.63 MG/3ML nebulizer solution Take 3 mLs (0.63 mg total) by nebulization 3 (three) times daily for 14 days. 12/13/19 12/27/19 Yes Kyle, Tyrone A, DO  losartan (COZAAR) 50 MG tablet Take 1 tablet by mouth once daily Patient taking differently: Take 50 mg by mouth daily.  06/14/19  Yes Laurey Morale, MD  Morphine Sulfate (MORPHINE CONCENTRATE) 10 MG/0.5ML SOLN concentrated solution Take 0.25 mLs (5 mg total) by mouth every 4 (four) hours for 5 days. 12/13/19 12/18/19 Yes Kyle, Tyrone A, DO  Multiple Vitamins-Minerals (PRESERVISION AREDS 2 PO) Take 1 tablet by mouth 2 (two) times daily.   Yes [provider]  NARCAN 4 MG/0.1ML LIQD nasal spray kit Place 1 spray into the nose as needed (for overdose).  09/10/19  Yes [provider]  oxymetazoline (AFRIN) 0.05 % nasal spray Place 1 spray into both nostrils at bedtime.    Yes [provider]  pantoprazole (PROTONIX) 40 MG tablet Take 1 tablet by mouth once daily Patient taking differently: Take 40 mg by mouth daily.  06/14/19  Yes Laurey Morale, MD  Polyethyl Glycol-Propyl Glycol (SYSTANE) 0.4-0.3 % SOLN Place 1 drop into both eyes 3 (three) times daily as needed (dry/irritated eyes.).   Yes [provider]  timolol (TIMOPTIC) 0.5 % ophthalmic solution Place 1 drop into both eyes 2 (two) times daily. 06/15/19  Yes [provider]  TRAVATAN Z 0.004 % SOLN ophthalmic solution Place 1 drop into both eyes at bedtime. 12/07/18  Yes Laurey Morale, MD  vitamin B-12 (CYANOCOBALAMIN) 1000 MCG tablet Take 2,000 mcg by mouth daily at 3 pm.   Yes [provider]  Calcium Carbonate-Vit D-Min 1200-1000 MG-UNIT CHEW Chew 1 tablet by mouth daily.    09/16/11  [provider]    Physical Exam:  Constitutional: Elderly female who appears chronically ill Vitals:   12/14/19 0644 12/14/19 0713 12/14/19 0728 12/14/19 0740  BP: 101/64 108/64 107/78   Pulse: 86 87 85   Resp: 15 18 12    Temp:      SpO2: 90% 92% 90% 92%  Weight:      Height:       Eyes: PERRL, lids and conjunctivae normal ENMT: Mucous membranes are moist. Posterior pharynx clear of any exudate or lesions.Normal dentition.  Neck: normal, supple, no masses, no thyromegaly Respiratory: Decreased overall aeration with mild wheezing appreciated at this time.  Patient on high flow nasal cannula oxygen with O2 saturations hovering in the lower 90s. Cardiovascular: Regular rate and rhythm, no murmurs / rubs / gallops. No extremity edema. 2+ pedal pulses. No carotid bruits.  Abdomen: no tenderness, no masses palpated. No hepatosplenomegaly. Bowel sounds positive.  Musculoskeletal: no clubbing / cyanosis. No joint deformity upper and lower extremities. Good ROM, no contractures. Normal muscle tone.  Skin: no rashes, lesions, ulcers. No induration Neurologic: CN 2-12 grossly intact. Sensation intact, DTR  normal. Strength 5/5 in all 4.  Psychiatric: Normal judgment and insight.  Lethargic and oriented x 3. Normal mood.     Labs on Admission: I have personally reviewed following labs and imaging studies  CBC: Recent Labs  Lab 12/09/19 0540 12/09/19 0540 12/11/19 0645 12/11/19 0645 12/12/19 0552 12/13/19 0925 12/14/19 0242 12/14/19 0246 12/14/19 0555  WBC 13.9*  --  12.2*  --  11.7* 11.0* 15.1*  --   --   NEUTROABS 11.6*  --  9.4*  --  8.9* 8.4* 11.8*  --   --   HGB 9.8*   < > 10.1*   < > 10.2* 10.0* 10.6* 10.9* 10.2*  HCT 30.9*   < > 32.6*   < > 32.8* 31.7* 34.9* 32.0* 30.0*  MCV 96.9  --  98.5  --  97.0 97.8 99.4  --   --   PLT 417*  --  381  --  361 361 415*  --   --    < > = values in this interval not displayed.   Basic Metabolic Panel: Recent Labs  Lab 12/09/19 0540 12/09/19 0540 12/11/19 0645 12/11/19 0645 12/12/19 0552 12/13/19 0925 12/14/19 0242 12/14/19 0246 12/14/19 0555  NA 138   < > 136   < > 135 135 134* 132* 134*  K 4.4   < > 4.2   < >  3.5 3.4* 4.6 4.1 4.4  CL 106  --  98  --  97* 97* 101  --   --   CO2 21*  --  25  --  25 26 19*  --   --   GLUCOSE 119*  --  118*  --  110* 127* 226*  --   --   BUN 12  --  14  --  15 18 19   --   --   CREATININE 0.45  --  0.63  --  0.48 0.56 1.03*  --   --   CALCIUM 8.9  --  8.8*  --  8.8* 8.7* 8.7*  --   --   PHOS  --   --   --   --   --  4.1  --   --   --    < > = values in this interval not displayed.   GFR: Estimated Creatinine Clearance: 43.8 mL/min (A) (by C-G formula based on SCr of 1.03 mg/dL (H)). Liver Function Tests: Recent Labs  Lab 12/07/19 1617 12/08/19 0446 12/13/19 0925 12/14/19 0242  AST 21 15  --  31  ALT 17 17  --  18  ALKPHOS 231* 209*  --  257*  BILITOT 0.8 0.6  --  0.8  PROT 6.9 6.7  --  6.3*  ALBUMIN 2.6* 2.5* 2.3* 2.1*   No results for input(s): LIPASE, AMYLASE in the last 168 hours. No results for input(s): AMMONIA in the last 168 hours. Coagulation Profile: No results for  input(s): INR, PROTIME in the last 168 hours. Cardiac Enzymes: No results for input(s): CKTOTAL, CKMB, CKMBINDEX, TROPONINI in the last 168 hours. BNP (last 3 results) No results for input(s): PROBNP in the last 8760 hours. HbA1C: No results for input(s): HGBA1C in the last 72 hours. CBG: No results for input(s): GLUCAP in the last 168 hours. Lipid Profile: No results for input(s): CHOL, HDL, LDLCALC, TRIG, CHOLHDL, LDLDIRECT in the last 72 hours. Thyroid Function Tests: No results for input(s): TSH, T4TOTAL, FREET4, T3FREE, THYROIDAB in the last 72 hours. Anemia Panel: No results for input(s): VITAMINB12, FOLATE, FERRITIN, TIBC, IRON, RETICCTPCT in the last 72 hours. Urine analysis:    Component Value Date/Time   COLORURINE AMBER (A) 06/13/2019 1040   APPEARANCEUR CLOUDY (A) 06/13/2019 1040   LABSPEC 1.018 06/13/2019 1040   PHURINE 6.0 06/13/2019 1040   GLUCOSEU NEGATIVE 06/13/2019 1040   HGBUR NEGATIVE 06/13/2019 1040   BILIRUBINUR NEGATIVE 06/13/2019 1040   BILIRUBINUR negative 01/23/2019 1136   KETONESUR NEGATIVE 06/13/2019 1040   PROTEINUR 30 (A) 06/13/2019 1040   UROBILINOGEN 0.2 01/23/2019 1136   NITRITE NEGATIVE 06/13/2019 1040   LEUKOCYTESUR LARGE (A) 06/13/2019 1040   Sepsis Labs: Recent Results (from the past 240 hour(s))  SARS Coronavirus 2 by RT PCR (hospital order, performed in [facility] hospital lab) Nasopharyngeal Nasopharyngeal Swab     Status: None   Collection Time: 12/07/19  4:17 PM   Specimen: Nasopharyngeal Swab  Result Value Ref Range Status   SARS Coronavirus 2 NEGATIVE NEGATIVE Final    Comment: (NOTE) SARS-CoV-2 target nucleic acids are NOT DETECTED. The SARS-CoV-2 RNA is generally detectable in upper and lower respiratory specimens during the acute phase of infection. The lowest concentration of SARS-CoV-2 viral copies this assay can detect is 250 copies / mL. A negative result does not preclude SARS-CoV-2 infection and should not be used as  the sole basis for treatment or other patient management decisions.  A negative result may occur with improper specimen collection / handling, submission of specimen other than nasopharyngeal swab, presence of viral mutation(s) within the areas targeted by this assay, and inadequate number of viral copies (<250 copies / mL). A negative result must be combined with clinical observations, patient history, and epidemiological information. Fact Sheet for Patients:   StrictlyIdeas.no Fact Sheet for Healthcare Providers: BankingDealers.co.za This test is not yet approved or cleared  by the [place] FDA and has been authorized for detection and/or diagnosis of SARS-CoV-2 by FDA under an Emergency Use Authorization (EUA).  This EUA will remain in effect (meaning this test can be used) for the duration of the COVID-19 declaration under Section 564(b)(1) of the Act, 21 U.S.C. section 360bbb-3(b)(1), unless the authorization is terminated or revoked sooner. Performed at [facility], [place] [address], [place], [place] [address]   Culture, blood (routine x 2) Call MD if unable to obtain prior to antibiotics being given     Status: None   Collection Time: 12/08/19 12:55 AM   Specimen: BLOOD  Result Value Ref Range Status   Specimen Description   Final    BLOOD RIGHT WRIST Performed at [facility] [address], [place], [place] [address]    Special Requests   Final    BOTTLES DRAWN AEROBIC AND ANAEROBIC Blood Culture adequate volume Performed at [facility] [address], [place], [place] [address]    Culture   Final    NO GROWTH 5 DAYS Performed at [facility], [place] [address] [place], [place] [address]    Report Status 12/13/2019 FINAL  Final  SARS Coronavirus 2 by RT PCR (hospital order, performed in [facility] hospital lab) Nasopharyngeal Nasopharyngeal Swab     Status:  None   Collection Time: 12/14/19  2:42 AM   Specimen: Nasopharyngeal Swab  Result Value Ref Range Status   SARS Coronavirus 2 NEGATIVE NEGATIVE Final    Comment: (NOTE) SARS-CoV-2 target nucleic acids are NOT DETECTED.  The SARS-CoV-2 RNA is generally detectable in upper and lower respiratory specimens during the acute phase of infection. The lowest concentration of SARS-CoV-2 viral copies this assay can detect is 250 copies / mL. A negative result does not preclude SARS-CoV-2 infection and should not be used as the sole basis for treatment or other patient management decisions.  A negative result may occur with improper specimen collection / handling, submission of specimen other than nasopharyngeal swab, presence of viral mutation(s) within the areas targeted by this assay, and inadequate number of viral copies (<250 copies / mL). A negative result must be combined with clinical observations, patient history, and epidemiological information.  Fact Sheet for Patients:   StrictlyIdeas.no  Fact Sheet for Healthcare Providers: BankingDealers.co.za  This test is not yet approved or  cleared by the [place] FDA and has been authorized for detection and/or diagnosis of SARS-CoV-2 by FDA under an Emergency Use Authorization (EUA).  This EUA will remain in effect (meaning this test can be used) for the duration of the COVID-19 declaration under Section 564(b)(1) of the Act, 21 U.S.C. section 360bbb-3(b)(1), unless the authorization is terminated or revoked sooner.  Performed at [facility], [place] [address], [place],  [address]   Blood culture (routine x 2)     Status: None (Preliminary result)   Collection Time: 12/14/19  5:31 AM   Specimen: BLOOD  Result Value Ref Range Status   Specimen Description BLOOD  LEFT ANTECUBITAL  Final   Special Requests   Final    BOTTLES DRAWN AEROBIC AND ANAEROBIC Blood Culture results  may not be optimal due to an excessive volume of blood received in culture bottles   Culture   Final    NO GROWTH <12 HOURS Performed at [facility] [address], [place], [place] [address]    Report Status PENDING  Incomplete  Blood culture (routine x 2)     Status: None (Preliminary result)   Collection Time: 12/14/19  5:32 AM   Specimen: BLOOD RIGHT HAND  Result Value Ref Range Status   Specimen Description BLOOD RIGHT HAND  Final   Special Requests   Final    BOTTLES DRAWN AEROBIC AND ANAEROBIC Blood Culture results may not be optimal due to an inadequate volume of blood received in culture bottles   Culture   Final    NO GROWTH <12 HOURS Performed at [facility], [place] [address] [place], [place] [address]    Report Status PENDING  Incomplete     Radiological Exams on Admission: DG Chest Portable 1 View  Result Date: 12/14/2019 CLINICAL DATA:  Shortness of breath EXAM: PORTABLE CHEST 1 VIEW COMPARISON:  12/10/2019 FINDINGS: Moderate cardiomegaly with interstitial pulmonary edema and small pleural effusions. IMPRESSION: Cardiomegaly with small pleural effusions and interstitial pulmonary edema. Electronically Signed   By: Ulyses Jarred M.D.   On: 12/14/2019 03:26    EKG: Independently reviewed.  Sinus rhythm at 94 bpm with QTc 514  Assessment/Plan Sepsis: Acute.  Patient presents febrile up to 100.6 F with WBC elevated at 15.1.  No initial lactic acid had been obtained it appears.  Urinalysis has not been obtained as of yet and chest x-ray noted small pleural effusions with interstitial pulmonary edema.  Patient had just recently been discharged for pneumonia.  Patient had been given empiric antibiotics of vancomycin, cefepime, and azithromycin. -Admit to a progressive bed -Follow-up blood and urine cultures -Add-on lactic acid -Continue empiric antibiotics of vancomycin and cefepime. -Tylenol as needed for fever  Respiratory failure with hypoxia and  hypercapnia: Acute on chronic.  Patient was noted respiratory acidosis with hypercapnia on admission.  Initial ABG revealed a pH of 7.327 with PCO2 51.5, and PO2 180.  Patient was placed on BiPAP temporarily, but repeat ABG noted improvement.  She is currently on high flow nasal cannula oxygen 10 L. -Continuous pulse oximetry with high flow nasal cannula oxygen -Solu-Medrol IV every 8 hours  Diastolic congestive heart failure exacerbation with pulmonary edema: Chest x-ray noting cardiomegaly with interstitial edema.  BNP elevated at 544.9.  Last stress test noted a EF of 69% with normal wall motion in 06/2015.  Home medications include Lasix 40 mg p.o. patient had been ordered 40 mg of Lasix IV, but had not had significant output by mid afternoon. -Strict intake and output  -Reassess in a.m. to determine if any benefit in continuing IV diuresis versus  Transient hypotension: On admission patient's blood pressures were noted to be as low as 83/47.  Home blood pressures amlodipine 5 mg daily, furosemide 40 mg daily, and losartan 50 mg daily. -Holding home oral blood pressure medications at this time   Acute kidney injury: Patient presented with creatinine elevated up to 1.03 with BUN 19.  Patient previous baseline creatinine noted to be 0.5-0.6. -Continue to monitor with diuresis  Metastatic adenocarcinoma of the lung: Patient with metastases to the spine.  Opted only for palliative radiation as  it would help with plane and declined chemotherapy.  Patient had gone home with hospice during previous admission.   -Given patient's poor overall status we will need to have continued discussions on overall goals of care. -Palliative care formally consulted  Prolonged QT Interval: Acute.  On admission QTC noted to be 14. -Hold QT prolonging medications -Correct any electrolyte abnormality -Recheck QTC in a.m.  DNR/DNI/on hospice: Present on admission.  Patient had just recently been sent home on  hospice.  DVT prophylaxis: Lovenox Code Status: DNR Family Communication: Husband updated at bedside Disposition Plan: To be determined Consults called: Palliative Admission status: Observation  Norval Morton MD [facility] Pager [phone]   If 7PM-7AM, please contact night-coverage [email or web address] Password [facility]  12/14/2019, 8:05 AM

## 2019-12-14 NOTE — Progress Notes (Signed)
Attempted to remove patient from bipap and place on 10 lpm high flow nasal cannula. Patients Sp02 levels decreased to 87-90% and patient had some c/o shortness of breath. Patient was placed back on bipap with Sp02 levels returning to 93-95%.

## 2019-12-14 NOTE — Progress Notes (Signed)
MEWS score yellow due to hypotention. Patient is also having apneic spells during her sleep. She is oxygenating between 89-91% while asleep. Husband is at the bedside and states they have not slept well in a few days. Patient understands her prognosis and she and her husband plan to return back home on hospice. She wishes to remain a DNR.  Vital Signs @MEWSNOTE @       Adrienne Trombetta Oletta Darter 12/14/2019,1:23 PM    12/14/19 1200  Assess: MEWS Score  Temp 98 F (36.7 C)  BP (!) 92/58  Pulse Rate 89  ECG Heart Rate 89  Resp 12  SpO2 92 %  Assess: MEWS Score  MEWS Temp 0  MEWS Systolic 1  MEWS Pulse 0  MEWS RR 1  MEWS LOC 0  MEWS Score 2  MEWS Score Color Yellow  Assess: if the MEWS score is Yellow or Red  Were vital signs taken at a resting state? Yes  Focused Assessment Documented focused assessment  Early Detection of Sepsis Score *See Row Information* High  MEWS guidelines implemented *See Row Information* Yes  Treat  MEWS Interventions Other (Comment)  Take Vital Signs  Increase Vital Sign Frequency  Yellow: Q 2hr X 2 then Q 4hr X 2, if remains yellow, continue Q 4hrs  Escalate  MEWS: Escalate Yellow: discuss with charge nurse/RN and consider discussing with provider and RRT  Notify: Charge Nurse/RN  Name of Charge Nurse/RN Notified Veneta Penton, RN  Date Charge Nurse/RN Notified 12/14/19  Time Charge Nurse/RN Notified 1320

## 2019-12-14 NOTE — Progress Notes (Signed)
Arterial gas was drawn while patient was wearing oxygen set at 12 lpm via high flow cannula

## 2019-12-14 NOTE — Progress Notes (Signed)
[facility])  If pt is stable to d/c Saturday am, [facility] will meet the family at home at 300pm to begin hospice services.  Please reach out with any concerns.  Venia Carbon RN, BSN, [facility] Liaison

## 2019-12-14 NOTE — Consult Note (Signed)
Consultation Note Date: 12/14/2019   Patient Name: Connie Lang  DOB: 10/17/1940  MRN: [id number]  Age / Sex: 79 y.o., female  PCP: Laurey Morale, MD Referring Physician: Norval Morton, MD  Reason for Consultation: Establishing goals of care  HPI/Patient Profile: 79 y.o. female  with past medical history of stage 4 non small cell lung cancer with mets to the spine and left iliac, breast cancer, vaginal cancer, PMR, macular degeneration (legally blind), OA and obesity who was admitted 12/07/2019 - 12/13/2019 with post obstructive pneumonia.  She has previously decided not to pursue chemotherapy and has finished palliative radiation therapy.   She has long term chronic pain for which she has been on opioids.    She was discharged home on 12/13/2019 with a plan to receive Hospice services.   On 12/14/2019 she returned to the ER with sudden SOB not relieved by oxygen.  Per notes there was concern that the morphine made her sleepy and decreased her blood pressure.  Clinical Assessment and Goals of Care:  I have reviewed medical records including EPIC notes, labs and imaging, received report from Dr. Tamala Julian, examined the patient and met at bedside with patient, husband and daughter [name]  to discuss diagnosis prognosis, [place], EOL wishes, disposition and options.  I recently met [name] and her husband [name] during their admission at [facility] last week.  They have a good understanding of what Palliative and Hospice can do for them.  During our last meeting on 6/6 we discussed a brief life review of the patient. [name] is a joyful person.  She went to nursing school and worked for a short time as a Marine scientist.  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  She married her highschool sweetheart, [name], and they appear to be two halves of one whole.  We spent a chunk of our meeting discussing their final arrangements.  They want  their ashes mixed together.  [name] and [name] have 4 children.  Two daughters Anderson Malta and [name] are in [place] and very supportive of their parents daily needs.  [name] is in [place], and [name] is in [place].  [name] and [name] tell me a story about [name] that starts with him being a Teacher, early years/pre in [place] and ends with him being a Horticulturist, commercial.  He also speaks fluent [place].  Needless to say they are very proud of their family.  I asked [name] and [name] what happened.  They arrived home from the hospital around 5 pm.  [name] quickly became short of breath.  [name] gave her morphine and was attempting to wait for it to take effect.  Unfortunately [name] continued to become worse and the family brought her to the ER.    The family has since discovered that [name]'s oxygen tubing was not connected properly and she was not receiving oxygen.     [name] and Anderson Malta - patient's two daughters are meeting with Hospice at the patient's house today at 5 pm to prepare for  their mother's services and discharge.  Per [name], her goals have not changed.  She does not want chemo or radiation.  She is a DNR / DNI.  She wants to go home with hospice and enjoy her time at home.     Questions and concerns were addressed.  The family was encouraged to call with questions or concerns.    Primary Decision Maker:  PATIENT    SUMMARY OF RECOMMENDATIONS     Home with Hospice when medically appropriate.    Patient states her oxygen tubing at home was not connected properly so she was not receiving oxygen.  Would discharge with morphine concentrate SL 5 mg - 10 mg PRN dyspnea or pain.  PMT will follow at a distance.  Please call our office if any needs arise.  Code Status/Advance Care Planning:  DNR   Symptom Management:   On appropriate symptom management   Will add MOM daily as she uses at home.  Palliative Prophylaxis:   frequent dyspnea assessment  Psycho-social/Spiritual:   Desire  for further Chaplaincy support: not needed at this time.  Prognosis:  Weeks to months give metastatic cancer and post obstructive pneumonia    Discharge Planning: Home with Hospice      Primary Diagnoses: Present on Admission:  Sepsis ([place])   I have reviewed the medical record, interviewed the patient and family, and examined the patient. The following aspects are pertinent.  Past Medical History:  Diagnosis Date   Allergy    occasionally has hayfever   Anxiety    Breast cancer ([place]) 2017   Cancer ([place]) 1987   cancerous cells in vaginal wall seemed to be metastatic from the gut but no primary was ever found    Cataract    Chicken pox    Depression    DJD (degenerative joint disease)    First degree AV block    GERD (gastroesophageal reflux disease)    Glaucoma    glaucoma and cataracts sees Dr Satira Sark    Hyperlipemia    Hypertension    pt denies currently. " Prednisone made it high."   LBBB (left bundle branch block)    Lumbar disc disease    sees Dr. Sherwood Gambler    Macular degeneration    Mobitz type 1 second degree atrioventricular block    Osteoarthritis    Osteopenia    Personal history of radiation therapy    PMR (polymyalgia rheumatica) ([place])    sees Dr. Leigh Aurora    Radiation    02-25-16 last radiation for breast cancer   Transfusion history    blood    Social History   Socioeconomic History   Marital status: Married    Spouse name: Not on file   Number of children: Not on file   Years of education: Not on file   Highest education level: Not on file  Occupational History   Not on file  Tobacco Use   Smoking status: Former Smoker    Packs/day: 0.50    Years: 36.00    Pack years: 18.00    Types: Cigarettes    Quit date: 07/05/1994    Years since quitting: 25.4   Smokeless tobacco: Never Used  Vaping Use   Vaping Use: Never used  Substance and Sexual Activity   Alcohol use: Yes    Alcohol/week: 7.0 standard  drinks    Types: 7 Glasses of wine per week    Comment: 08/02/2019- not currently- taking pain medications   Drug  use: No   Sexual activity: Not on file  Other Topics Concern   Not on file  Social History Narrative   Not on file   Social Determinants of Health   Financial Resource Strain:    Difficulty of Paying Living Expenses:   Food Insecurity:    Worried About [place] in the Last Year:    Arboriculturist in the Last Year:   Transportation Needs:    Film/video editor (Medical):    Lack of Transportation (Non-Medical):   Physical Activity:    Days of Exercise per Week:    Minutes of Exercise per Session:   Stress:    Feeling of Stress :   Social Connections:    Frequency of Communication with Friends and Family:    Frequency of Social Gatherings with Friends and Family:    Attends Religious Services:    Active Member of Clubs or Organizations:    Attends Music therapist:    Marital Status:    Family History  Problem Relation Age of Onset   Alzheimer's disease Father    Heart failure Father        d. 67   Stroke Mother 49   Cancer Sister        ovarian   Alzheimer's disease Paternal Aunt    Breast cancer Paternal Aunt    Colon cancer Brother        dx. 68s   Stroke Maternal Aunt        d. 54s   Heart disease Maternal Grandfather    Diabetes Maternal Grandfather    Stroke Paternal Grandmother 42   Heart attack Paternal Grandfather        d. 52s   Ovarian cancer Sister        dx. 70s; no genetic testing   Breast cancer Daughter    Leukemia Grandchild 21   Skin cancer Grandchild    Cervical cancer Cousin 60       maternal 1st cousin   Alzheimer's disease Paternal Aunt        (x4) paternal aunts   Heart attack Paternal Uncle 27   Colon polyps Neg Hx    Rectal cancer Neg Hx    Stomach cancer Neg Hx     Allergies  Allergen Reactions   Temazepam Other (See Comments)    Comatose  requiring Narcan      Vital Signs: BP (!) 109/56    Pulse 88    Temp 97.7 F (36.5 C) (Oral)    Resp 17    Ht 5' 4"  (1.626 m)    Wt 77.1 kg    LMP  (LMP Unknown) Comment: full   SpO2 91%    BMI 29.18 kg/m  Pain Scale: 0-10   Pain Score: 7    SpO2: SpO2: 91 % O2 Device:SpO2: 91 % O2 Flow Rate: .O2 Flow Rate (L/min): 15 L/min    Palliative Assessment/Data: 30%     Time In: 2:00  Time Out: 2:50 Time Total: 50 min. Visit consisted of counseling and education dealing with the complex and emotionally intense issues surrounding the need for palliative care and symptom management in the setting of serious and potentially life-threatening illness. Greater than 50%  of this time was spent counseling and coordinating care related to the above assessment and plan.  Signed by: Florentina Jenny, PA-C Palliative Medicine  Please contact Palliative Medicine Team phone at [phone] for questions and concerns.  For individual  provider: See Shea Evans

## 2019-12-14 NOTE — ED Provider Notes (Signed)
[facility] EMERGENCY DEPARTMENT Provider Note   CSN: [id number] Arrival date & time: 12/14/19  4627     History Chief Complaint  Patient presents with  . Respiratory Distress    Connie Lang is a 79 y.o. female.  The history is provided by the EMS personnel. The history is limited by the condition of the patient.  Shortness of Breath Severity:  Severe Onset quality:  Sudden Timing:  Constant Progression:  Unchanged Chronicity:  Recurrent Context: not weather changes   Context comment:  Patient with metastatic adenocarcinoma of the lung who was recently hospitalized for PNA and changed from oxycodone to morphine and increased O2 to 9 l at home.   Relieved by:  Nothing Worsened by:  Nothing Ineffective treatments:  Oxygen (nebs) Associated symptoms: wheezing   Associated symptoms: no diaphoresis   Risk factors: hx of cancer        Past Medical History:  Diagnosis Date  . Allergy    occasionally has hayfever  . Anxiety   . Breast cancer ([place]) 2017  . Cancer ([place]) 1987   cancerous cells in vaginal wall seemed to be metastatic from the gut but no primary was ever found   . Cataract   . Chicken pox   . Depression   . DJD (degenerative joint disease)   . First degree AV block   . GERD (gastroesophageal reflux disease)   . Glaucoma    glaucoma and cataracts sees Dr Satira Sark   . Hyperlipemia   . Hypertension    pt denies currently. " Prednisone made it high."  . LBBB (left bundle branch block)   . Lumbar disc disease    sees Dr. Sherwood Gambler   . Macular degeneration   . Mobitz type 1 second degree atrioventricular block   . Osteoarthritis   . Osteopenia   . Personal history of radiation therapy   . PMR (polymyalgia rheumatica) (HCC)    sees Dr. Leigh Aurora   . Radiation    02-25-16 last radiation for breast cancer  . Transfusion history    blood     Patient Active Problem List   Diagnosis Date Noted  . Hospice care patient 12/13/2019   . Palliative care patient 12/13/2019  . Advanced care planning/counseling discussion   . DNR (do not resuscitate) 12/09/2019  . CAP (community acquired pneumonia) 12/07/2019  . Thoracic back pain 10/31/2019  . Goals of care, counseling/discussion 09/13/2019  . Stage IV adenocarcinoma of lung ([place]) 09/13/2019  . Encounter for antineoplastic chemotherapy 09/13/2019  . Adenocarcinoma of left lung, stage 4 ([place]) 09/13/2019  . Adenocarcinoma of unknown primary ([place]) 08/23/2019  . 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 Osteoarthritis of left hip 11/11/2016  . Family history of ovarian cancer 01/27/2016  . Family history of colon cancer 01/27/2016  . Breast cancer of upper-outer quadrant of right female breast ([place]) 12/03/2015  . Generalized edema 11/21/2015  . PMR (polymyalgia rheumatica) (HCC) 07/23/2015  . RBBB (right bundle branch block) 11/01/2013  . HTN (hypertension) 11/01/2013  . Obesity, unspecified 11/01/2013  . Pure hypercholesterolemia 11/01/2013  . CARCINOMA, BASAL CELL 07/29/2009  . Hyperlipemia, mixed 07/29/2009  . GLAUCOMA 07/29/2009  . Essential hypertension 07/29/2009  . Osteoarthritis 07/29/2009  . COLONIC POLYPS, HX OF 07/29/2009    Past Surgical History:  Procedure Laterality Date  . ABDOMINAL HYSTERECTOMY     TAH BSO 1985  . APPENDECTOMY    . BASAL CELL CARCINOMA EXCISION     nose and rt  thigh  . BREAST BIOPSY      x 4  . BREAST EXCISIONAL BIOPSY Right 1997  . BREAST EXCISIONAL BIOPSY Left 1985  . BREAST EXCISIONAL BIOPSY Left 1965  . BREAST EXCISIONAL BIOPSY Right 1959  . BREAST LUMPECTOMY Right 12/25/2015  . BREAST LUMPECTOMY  02/27/2018   see report  . BREAST LUMPECTOMY WITH RADIOACTIVE SEED LOCALIZATION Right 12/25/2015   Procedure: RIGHT BREAST LUMPECTOMY WITH RADIOACTIVE SEED LOCALIZATION;  Surgeon: Rolm Bookbinder, MD;  Location: [facility];  Service: General;  Laterality: Right;  . CATARACT EXTRACTION     01-27-2011 left, 02-17-2011  right   . CERVICAL LAMINECTOMY     1983  . cervical microdiskectomy     1990 Dr Jovita Gamma  . CESAREAN SECTION    . COLONOSCOPY  03/21/2018   per Dr. Silverio Decamp, adenomatous polyps, no repeats due to age   . EYE SURGERY     bil cataracts with lens implant  . FOOT ARTHRODESIS, MODIFIED MCBRIDE Right   . LUMBAR LAMINECTOMY     1990 Dr Jovita Gamma  . MYELOGRAM    . POLYPECTOMY    . REFRACTIVE SURGERY     01-29-2009 04-07-2009 Dr Foye Clock in [place]  . spinal injection     07-30-2012, 09-24-2012  . TENDON REPAIR Right    rt wrist  . TOTAL HIP ARTHROPLASTY Right 05/13/2016   Procedure: RIGHT TOTAL HIP ARTHROPLASTY ANTERIOR APPROACH;  Surgeon: Rod Can, MD;  Location: [facility];  Service: Orthopedics;  Laterality: Right;  [place] RNFA  . TOTAL HIP ARTHROPLASTY Left 11/11/2016   Procedure: LEFT TOTAL HIP ARTHROPLASTY ANTERIOR APPROACH;  Surgeon: Rod Can, MD;  Location: [facility];  Service: Orthopedics;  Laterality: Left;  Dr. requesting RNFA  . [place]     x3  . WRIST SURGERY Right    tendon repair     OB History   No obstetric history on file.     Family History  Problem Relation Age of Onset  . Alzheimer's disease Father   . Heart failure Father        d. 74  . Stroke Mother 65  . Cancer Sister        ovarian  . Alzheimer's disease Paternal Aunt   . Breast cancer Paternal Aunt   . Colon cancer Brother        dx. 78s  . Stroke Maternal Aunt        d. 90s  . Heart disease Maternal Grandfather   . Diabetes Maternal Grandfather   . Stroke Paternal Grandmother 73  . Heart attack Paternal Grandfather        d. 58s  . Ovarian cancer Sister        dx. 32s; no genetic testing  . Breast cancer Daughter   . Leukemia Grandchild 21  . Skin cancer Grandchild   . Cervical cancer Cousin 17       maternal 1st cousin  . Alzheimer's disease Paternal Aunt        (x4) paternal aunts  . Heart attack Paternal Uncle 1  . Colon polyps Neg Hx    . Rectal cancer Neg Hx   . Stomach cancer Neg Hx     Social History   Tobacco Use  . Smoking status: Former Smoker    Packs/day: 0.50    Years: 36.00    Pack years: 18.00    Types: Cigarettes    Quit date: 07/05/1994    Years  since quitting: 25.4  . Smokeless tobacco: Never Used  Vaping Use  . Vaping Use: Never used  Substance Use Topics  . Alcohol use: Yes    Alcohol/week: 7.0 standard drinks    Types: 7 Glasses of wine per week    Comment: 08/02/2019- not currently- taking pain medications  . Drug use: No    Home Medications Prior to Admission medications   Medication Sig Start Date End Date Taking? Authorizing Provider  acetaminophen (TYLENOL) 650 MG CR tablet Take 650-1,300 mg by mouth every 8 (eight) hours as needed for pain.   Yes [provider]  amLODipine (NORVASC) 5 MG tablet TAKE 1 TABLET BY MOUTH ONCE DAILY IN THE EVENING Patient taking differently: Take 5 mg by mouth daily.  09/03/19  Yes Laurey Morale, MD  anti-nausea (EMETROL) solution Take 10 mLs by mouth every 15 (fifteen) minutes as needed for nausea or vomiting.   Yes [provider]  chlorpheniramine (CHLOR-TRIMETON) 4 MG tablet Take 4 mg by mouth daily as needed for allergies.   Yes [provider]  cyclobenzaprine (FLEXERIL) 10 MG tablet Take 1 tablet by mouth three times daily as needed for muscle spasm 09/03/19  Yes Laurey Morale, MD  diphenhydrAMINE (BENADRYL) 12.5 MG chewable tablet Chew 12.5 mg by mouth 2 (two) times daily as needed (for allergic reaction (lip swelling)).   Yes [provider]  docusate sodium (COLACE) 100 MG capsule Take 1 capsule (100 mg total) by mouth 2 (two) times daily. 12/13/19  Yes Kyle, Tyrone A, DO  furosemide (LASIX) 40 MG tablet Take 1 tablet by mouth twice daily Patient taking differently: Take 40 mg by mouth daily.  06/14/19  Yes Laurey Morale, MD  gabapentin (NEURONTIN) 300 MG capsule Take 1 capsule (300 mg total) by mouth at bedtime.  09/10/19  Yes Nicholas Lose, MD  guaifenesin (TUSSIN) 100 MG/5ML syrup Take 200 mg by mouth 3 (three) times daily as needed for cough.   Yes [provider]  ibuprofen (ADVIL,MOTRIN) 200 MG tablet Take 400 mg by mouth every 8 (eight) hours as needed (for pain).    Yes [provider]  ipratropium (ATROVENT) 0.02 % nebulizer solution Take 2.5 mLs (0.5 mg total) by nebulization 3 (three) times daily for 14 days. 12/13/19 12/27/19 Yes Kyle, Tyrone A, DO  levalbuterol (XOPENEX) 0.63 MG/3ML nebulizer solution Take 3 mLs (0.63 mg total) by nebulization 3 (three) times daily for 14 days. 12/13/19 12/27/19 Yes Kyle, Tyrone A, DO  losartan (COZAAR) 50 MG tablet Take 1 tablet by mouth once daily Patient taking differently: Take 50 mg by mouth daily.  06/14/19  Yes Laurey Morale, MD  Morphine Sulfate (MORPHINE CONCENTRATE) 10 MG/0.5ML SOLN concentrated solution Take 0.25 mLs (5 mg total) by mouth every 4 (four) hours for 5 days. 12/13/19 12/18/19 Yes Kyle, Tyrone A, DO  Multiple Vitamins-Minerals (PRESERVISION AREDS 2 PO) Take 1 tablet by mouth 2 (two) times daily.   Yes [provider]  NARCAN 4 MG/0.1ML LIQD nasal spray kit Place 1 spray into the nose as needed (for overdose).  09/10/19  Yes [provider]  oxymetazoline (AFRIN) 0.05 % nasal spray Place 1 spray into both nostrils at bedtime.    Yes [provider]  pantoprazole (PROTONIX) 40 MG tablet Take 1 tablet by mouth once daily Patient taking differently: Take 40 mg by mouth daily.  06/14/19  Yes Laurey Morale, MD  Polyethyl Glycol-Propyl Glycol (SYSTANE) 0.4-0.3 % SOLN Place 1 drop  into both eyes 3 (three) times daily as needed (dry/irritated eyes.).   Yes [provider]  timolol (TIMOPTIC) 0.5 % ophthalmic solution Place 1 drop into both eyes 2 (two) times daily. 06/15/19  Yes [provider]  TRAVATAN Z 0.004 % SOLN ophthalmic solution Place 1 drop into both eyes at bedtime. 12/07/18  Yes Laurey Morale, MD  vitamin B-12 (CYANOCOBALAMIN) 1000 MCG tablet Take 2,000 mcg by mouth daily at 3 pm.   Yes [provider]  Calcium Carbonate-Vit D-Min 1200-1000 MG-UNIT CHEW Chew 1 tablet by mouth daily.    09/16/11  [provider]    Allergies    Temazepam  Review of Systems   Review of Systems  Unable to perform ROS: Acuity of condition  Constitutional: Negative for diaphoresis.  Respiratory: Positive for shortness of breath and wheezing.   Cardiovascular: Negative for leg swelling.    Physical Exam Updated Vital Signs BP 106/61   Pulse 80   Temp (!) 100.6 F (38.1 C)   Resp 17   Ht _0  (1.626 m)   Wt 77.1 kg   LMP  (LMP Unknown) Comment: full  SpO2 92%   BMI 29.18 kg/m   Physical Exam Vitals and nursing note reviewed.  Constitutional:      General: She is in acute distress.     Appearance: Normal appearance.  HENT:     Head: Normocephalic and atraumatic.     Nose: Nose normal.  Eyes:     Conjunctiva/sclera: Conjunctivae normal.     Pupils: Pupils are equal, round, and reactive to light.  Cardiovascular:     Rate and Rhythm: Normal rate and regular rhythm.     Pulses: Normal pulses.     Heart sounds: Normal heart sounds.  Pulmonary:     Effort: Respiratory distress present.     Breath sounds: Wheezing present.  Abdominal:     General: Abdomen is flat. Bowel sounds are normal.     Tenderness: There is no abdominal tenderness. There is no guarding.  Musculoskeletal:        General: Normal range of motion.     Cervical back: Normal range of motion and neck supple.     Right lower leg: No edema.     Left lower leg: No edema.  Skin:    General: Skin is warm and dry.     Capillary Refill: Capillary refill takes less than 2 seconds.  Neurological:     Mental Status: She is alert.     Deep Tendon Reflexes: Reflexes normal.  Psychiatric:     Comments: Unable      ED Results / Procedures / Treatments   Labs (all labs ordered are listed,  but only abnormal results are displayed) Results for orders placed or performed during the hospital encounter of 12/14/19  SARS Coronavirus 2 by RT PCR (hospital order, performed in [place] hospital lab) Nasopharyngeal Nasopharyngeal Swab   Specimen: Nasopharyngeal Swab  Result Value Ref Range   SARS Coronavirus 2 NEGATIVE NEGATIVE  CBC with Differential/Platelet  Result Value Ref Range   WBC 15.1 (H) 4.0 - 10.5 K/uL   RBC 3.51 (L) 3.87 - 5.11 MIL/uL   Hemoglobin 10.6 (L) 12.0 - 15.0 g/dL   HCT 34.9 (L) 36 - 46 %   MCV 99.4 80.0 - 100.0 fL   MCH 30.2 26.0 - 34.0 pg   MCHC 30.4 30.0 - 36.0 g/dL   RDW 15.1 11.5 - 15.5 %  Platelets 415 (H) 150 - 400 K/uL   nRBC 0.0 0.0 - 0.2 %   Neutrophils Relative % 77 %   Neutro Abs 11.8 (H) 1.7 - 7.7 K/uL   Lymphocytes Relative 7 %   Lymphs Abs 1.0 0.7 - 4.0 K/uL   Monocytes Relative 10 %   Monocytes Absolute 1.5 (H) 0 - 1 K/uL   Eosinophils Relative 2 %   Eosinophils Absolute 0.3 0 - 0 K/uL   Basophils Relative 1 %   Basophils Absolute 0.1 0 - 0 K/uL   Immature Granulocytes 3 %   Abs Immature Granulocytes 0.48 (H) 0.00 - 0.07 K/uL  Comprehensive metabolic panel  Result Value Ref Range   Sodium 134 (L) 135 - 145 mmol/L   Potassium 4.6 3.5 - 5.1 mmol/L   Chloride 101 98 - 111 mmol/L   CO2 19 (L) 22 - 32 mmol/L   Glucose, Bld 226 (H) 70 - 99 mg/dL   BUN 19 8 - 23 mg/dL   Creatinine, Ser 1.03 (H) 0.44 - 1.00 mg/dL   Calcium 8.7 (L) 8.9 - 10.3 mg/dL   Total Protein 6.3 (L) 6.5 - 8.1 g/dL   Albumin 2.1 (L) 3.5 - 5.0 g/dL   AST 31 15 - 41 U/L   ALT 18 0 - 44 U/L   Alkaline Phosphatase 257 (H) 38 - 126 U/L   Total Bilirubin 0.8 0.3 - 1.2 mg/dL   GFR calc non Af Amer 51 (L) >60 mL/min   GFR calc Af Amer 59 (L) >60 mL/min   Anion gap 14 5 - 15  Brain natriuretic peptide  Result Value Ref Range   B Natriuretic Peptide 544.9 (H) 0.0 - 100.0 pg/mL  I-Stat arterial blood gas, ED  Result Value Ref Range   pH, Arterial 7.327 (L) 7.35 -  7.45   pCO2 arterial 51.5 (H) 32 - 48 mmHg   pO2, Arterial 180 (H) 83 - 108 mmHg   Bicarbonate 27.1 20.0 - 28.0 mmol/L   TCO2 29 22 - 32 mmol/L   O2 Saturation 99.0 %   Acid-Base Excess 0.0 0.0 - 2.0 mmol/L   Sodium 132 (L) 135 - 145 mmol/L   Potassium 4.1 3.5 - 5.1 mmol/L   Calcium, Ion 1.20 1.15 - 1.40 mmol/L   HCT 32.0 (L) 36 - 46 %   Hemoglobin 10.9 (L) 12.0 - 15.0 g/dL   Patient temperature 98.0 F    Collection site Radial    Drawn by Operator    Sample type ARTERIAL   POC SARS Coronavirus 2 Ag-ED -  Result Value Ref Range   SARS Coronavirus 2 Ag NEGATIVE NEGATIVE  Troponin I (High Sensitivity)  Result Value Ref Range   Troponin I (High Sensitivity) 16 <18 ng/L   DG Chest 1 View  Result Date: 12/10/2019 CLINICAL DATA:  Dyspnea EXAM: CHEST  1 VIEW COMPARISON:  12/07/2019 FINDINGS: Cardiac shadow is enlarged but stable. Aortic calcifications are again seen. Changes of prior fusion in the midthoracic spine are noted. Persistent bibasilar opacities are noted right slightly greater than left. No sizable effusion is seen. No bony abnormality is noted. IMPRESSION: Persistent bibasilar opacities as described. Electronically Signed   By: Inez Catalina M.D.   On: 12/10/2019 09:48   DG Chest Portable 1 View  Result Date: 12/14/2019 CLINICAL DATA:  Shortness of breath EXAM: PORTABLE CHEST 1 VIEW COMPARISON:  12/10/2019 FINDINGS: Moderate cardiomegaly with interstitial pulmonary edema and small pleural effusions. IMPRESSION: Cardiomegaly with small pleural effusions and  interstitial pulmonary edema. Electronically Signed   By: Ulyses Jarred M.D.   On: 12/14/2019 03:26   DG Chest Portable 1 View  Result Date: 12/07/2019 CLINICAL DATA:  Shortness of breath. EXAM: PORTABLE CHEST 1 VIEW COMPARISON:  June 13, 2019 FINDINGS: Mild diffuse chronic appearing increased lung markings are seen. Mild to moderate severity areas of atelectasis and/or infiltrate are seen within the bilateral lung bases,  right greater than left. There is no evidence of a pleural effusion or pneumothorax. The cardiac silhouette is moderately enlarged. There is marked severity calcification of the aortic arch. Bilateral pedicle screws are seen within the midthoracic spine. This represents a new finding when compared to the prior exam. IMPRESSION: 1. Chronic appearing increased lung markings with mild to moderate severity bibasilar atelectasis and/or infiltrate, right greater than left. 2. Interval mid thoracic spine fusion. Electronically Signed   By: Virgina Norfolk M.D.   On: 12/07/2019 16:54    EKG  EKG Interpretation  Date/Time:  Friday December 14 2019 02:38:50 EDT Ventricular Rate:  94 PR Interval:    QRS Duration: 160 QT Interval:  411 QTC Calculation: 514 R Axis:   -61 Text Interpretation: Sinus rhythm Atrial premature complex Left bundle branch block Confirmed by Vernelle Wisner ([id number]) on 12/14/2019 5:51:36 AM       Radiology DG Chest Portable 1 View  Result Date: 12/14/2019 CLINICAL DATA:  Shortness of breath EXAM: PORTABLE CHEST 1 VIEW COMPARISON:  12/10/2019 FINDINGS: Moderate cardiomegaly with interstitial pulmonary edema and small pleural effusions. IMPRESSION: Cardiomegaly with small pleural effusions and interstitial pulmonary edema. Electronically Signed   By: Ulyses Jarred M.D.   On: 12/14/2019 03:26    Procedures Procedures (including critical care time)  Medications Ordered in ED Medications  vancomycin (VANCOCIN) IVPB 1000 mg/200 mL premix (has no administration in time range)  ceFEPIme (MAXIPIME) 2 g in sodium chloride 0.9 % 100 mL IVPB (has no administration in time range)  acetaminophen (TYLENOL) tablet 1,000 mg (has no administration in time range)  ketorolac (TORADOL) 30 MG/ML injection 30 mg (has no administration in time range)  furosemide (LASIX) injection 40 mg (40 mg Intravenous Given 12/14/19 0451)    ED Course  I have reviewed the triage vital signs and the nursing  notes.  Pertinent labs & imaging results that were available during my care of the patient were reviewed by me and considered in my medical decision making (see chart for details).    Patient is a DNR DNI with metastatic lung CA.  Presents with acute SOB not relieved by oxygen.  Cultures sent.  I suspect this is a combination of worsening disease, o2 at home that did not work and the morphine which made her sleepy and decreased her BP.    MDM Reviewed: previous chart, nursing note and vitals Reviewed previous: labs, ECG and x-ray Interpretation: ECG, labs and x-ray (pulmonary edema by me on cxr, first troponin is negative ) Total time providing critical care: 30-74 minutes (placed on BIPAP immediately .). This excludes time spent performing separately reportable procedures and services. Consults: admitting MD   Final Clinical Impression(s) / ED Diagnoses Final diagnoses:  Acute pulmonary edema ([place])  Primary malignant neoplasm of lung metastatic to other site, unspecified laterality [facility])    Admit to medicine    Jonique Kulig, MD 12/14/19 [id number]

## 2019-12-14 NOTE — Progress Notes (Signed)
Pharmacy Antibiotic Note  Connie Lang is a 79 y.o. female admitted on 12/14/2019 with pneumonia.  Pharmacy has been consulted for vancomycin and cefepime dosing.  Plan: Vancomycin 500 mg IV x 1 (in addition to 1g administered for total of 1500mg ), then 750 mg IV every 12 hours Cefepime 2g IV every 12 hours Add MRSA PCR Monitor renal function, Cx/PCR to narrow Vancomycin trough at steady state  Height: 5\' 4"  (162.6 cm) Weight: 77.1 kg (170 lb) IBW/kg (Calculated) : 54.7  Temp (24hrs), Avg:100.6 F (38.1 C), Min:100.6 F (38.1 C), Max:100.6 F (38.1 C)  Recent Labs  Lab 12/09/19 0540 12/11/19 0645 12/12/19 0552 12/13/19 0925 12/14/19 0242  WBC 13.9* 12.2* 11.7* 11.0* 15.1*  CREATININE 0.45 0.63 0.48 0.56 1.03*    Estimated Creatinine Clearance: 43.8 mL/min (A) (by C-G formula based on SCr of 1.03 mg/dL (H)).    Allergies  Allergen Reactions  . Temazepam Other (See Comments)    Comatose requiring Narcan    Bertis Ruddy, PharmD Clinical Pharmacist ED Pharmacist Phone # [phone] 12/14/2019 8:50 AM

## 2019-12-14 NOTE — Progress Notes (Signed)
Hydrologist [facility]) Hospital Liaison: RN note   This patient was newly referred to [facility] but had not been admitted to our service yet.   [place] will continue to follow while hospitalized and assist with coordination of initiation of hospice services at time of discharge.   Please call with any hospice related questions.  Thank you for this referral.  Farrel Gordon, RN, [facility] (listed on [place] under [place])  [phone]

## 2019-12-14 NOTE — Progress Notes (Signed)
Placed patient on bipap via servo I for increased shortness of breath.

## 2019-12-14 NOTE — ED Triage Notes (Signed)
Pt husband called EMS for resp distress.When EMS arrived pt was on a neb with sats in the 60's. Pt arrived moderate resp distress. EMS gave 2 grams mag, duo-neb, 125mg  solumedrol.

## 2019-12-15 DIAGNOSIS — N179 Acute kidney failure, unspecified: Secondary | ICD-10-CM | POA: Diagnosis present

## 2019-12-15 DIAGNOSIS — I959 Hypotension, unspecified: Secondary | ICD-10-CM | POA: Diagnosis present

## 2019-12-15 LAB — CBC
HCT: 29.4 % — ABNORMAL LOW (ref 36.0–46.0)
Hemoglobin: 9.2 g/dL — ABNORMAL LOW (ref 12.0–15.0)
MCH: 30.4 pg (ref 26.0–34.0)
MCHC: 31.3 g/dL (ref 30.0–36.0)
MCV: 97 fL (ref 80.0–100.0)
Platelets: 372 10*3/uL (ref 150–400)
RBC: 3.03 MIL/uL — ABNORMAL LOW (ref 3.87–5.11)
RDW: 14.8 % (ref 11.5–15.5)
WBC: 17.1 10*3/uL — ABNORMAL HIGH (ref 4.0–10.5)
nRBC: 0 % (ref 0.0–0.2)

## 2019-12-15 LAB — URINALYSIS, ROUTINE W REFLEX MICROSCOPIC
Bilirubin Urine: NEGATIVE
Glucose, UA: NEGATIVE mg/dL
Hgb urine dipstick: NEGATIVE
Ketones, ur: NEGATIVE mg/dL
Leukocytes,Ua: NEGATIVE
Nitrite: NEGATIVE
Protein, ur: NEGATIVE mg/dL
Specific Gravity, Urine: 1.017 (ref 1.005–1.030)
pH: 5 (ref 5.0–8.0)

## 2019-12-15 LAB — BASIC METABOLIC PANEL
Anion gap: 12 (ref 5–15)
BUN: 30 mg/dL — ABNORMAL HIGH (ref 8–23)
CO2: 24 mmol/L (ref 22–32)
Calcium: 8.5 mg/dL — ABNORMAL LOW (ref 8.9–10.3)
Chloride: 99 mmol/L (ref 98–111)
Creatinine, Ser: 1.26 mg/dL — ABNORMAL HIGH (ref 0.44–1.00)
GFR calc Af Amer: 47 mL/min — ABNORMAL LOW (ref >60)
GFR calc non Af Amer: 40 mL/min — ABNORMAL LOW (ref >60)
Glucose, Bld: 161 mg/dL — ABNORMAL HIGH (ref 70–99)
Potassium: 4.1 mmol/L (ref 3.5–5.1)
Sodium: 135 mmol/L (ref 135–145)

## 2019-12-15 MED ORDER — METHYLPREDNISOLONE SODIUM SUCC 125 MG IJ SOLR
60.0000 mg | Freq: Three times a day (TID) | INTRAMUSCULAR | Status: DC
  Administered 2019-12-15 (×2): 60 mg via INTRAVENOUS
  Filled 2019-12-15 (×2): qty 2

## 2019-12-15 MED ORDER — FUROSEMIDE 10 MG/ML IJ SOLN
40.0000 mg | Freq: Once | INTRAMUSCULAR | Status: AC
  Administered 2019-12-15: 40 mg via INTRAVENOUS
  Filled 2019-12-15: qty 4

## 2019-12-15 MED ORDER — FUROSEMIDE 10 MG/ML IJ SOLN
40.0000 mg | Freq: Two times a day (BID) | INTRAMUSCULAR | Status: DC
  Administered 2019-12-15 – 2019-12-16 (×3): 40 mg via INTRAVENOUS
  Filled 2019-12-15 (×3): qty 4

## 2019-12-15 NOTE — Progress Notes (Signed)
PROGRESS NOTE    MAGUIRE KILLMER  YTK:[id number] DOB: 08/18/1940 DOA: 12/14/2019 PCP: Laurey Morale, MD      Brief Narrative:  Connie Lang is a 79 y.o. F with macular degeneration legally blind, LBBB, HTN, PMR and metastatic lung cancer on Hospice who presented with fever and hypoxia  Patient admitted for 5 days last week due to new onset dyspnea, found to have dense LLL post-obstructive pneumonia.  Treated with antibiotics and diuretics with only minimal improvement, only able to wean from 12 to 10L O2/min, and was discharged to home with Hospice following.   Unfortunately, after arriving home, patient developed dyspnea within a few hours, so severe that she was gasping for air and so came back to the ER.  In the ER, temp 100.22F, BP 80s/50s, WBC up to 17K.  CXR showed edema superimposed on known post-obstructive pneuonia.  Patient was started on vancomycin and cefepime and admitted to the hospital.      Assessment & Plan:  Acute hypoxic respiratory failure, suspect due to postobstructive pneumonia in the left lower lobe Metastatic lung cancer  -Hold steroids -Continue vancomycin and cefepime -Continue scheduled bronchodilators -Continue gabapentin and oral morphine for pain -Observe overnight for improvement with antibiotics, may attempt ultrasound thoracentesis tomorrow   Acute on chronic diastolic CHF -Furosemide 40 mg IV  -Strict I/Os, daily weights, telemetry  -Daily monitoring renal function    AKI Cr up to 1.2 from baseline 0.4. Suspect this is congestive nephropathy -Lasix and follow BMP  Hypertension BP soft -Hold home amlodipine and losartan  Anemia Due to malignancy  Glaucoma -Continue eyedrops   Urinary retention Noted some 800cc urine retention last night. -qshift bladder scan and I/O cath for PVR > 300 -If I/O cath needed >24 hours, will place foley       Disposition: Status is: Inpatient  Remains inpatient appropriate  because:patient has end stage cancer, likely prognosis of days to weeks, needs IV lasix for palliation of dyspnea and inpatient services are warranted to prevent undue suffereing at end of life   Dispo: The patient is from: Home              Anticipated d/c is to: Home              Anticipated d/c date is: 2 days              Patient currently is not medically stable to d/c.              MDM: The below labs and imaging reports were reviewed and summarized above.  Medication management as above.  This is a severe illness with threat to life.     DVT prophylaxis: enoxaparin (LOVENOX) injection 40 mg Start: 12/14/19 1000  Code Status: DNR Family Communication: Husband    Consultants:     Procedures:     Antimicrobials:   Vancomycin and cefepime 6/11 >>   Culture data:   6/11 blood cutlure x2 -- ngtd  6/12 urine culture -- ng           Subjective: Patient still feels out of breath.  No fever overnight.  No confusion, no vomiting.  Objective: Vitals:   12/15/19 0739 12/15/19 0752 12/15/19 0800 12/15/19 1123  BP: (!) 115/50  120/64 122/65  Pulse: 88  90 100  Resp: 13  14 20   Temp: 98 F (36.7 C)   98.7 F (37.1 C)  TempSrc: Oral   Oral  SpO2: 95% 93% 94%  92%  Weight:      Height:        Intake/Output Summary (Last 24 hours) at 12/15/2019 1301 Last data filed at 12/15/2019 0859 Gross per 24 hour  Intake 1210.19 ml  Output 800 ml  Net 410.19 ml   Filed Weights   12/14/19 0240  Weight: 77.1 kg    Examination: General appearance:  adult female, alert and in [place] distress.   HEENT: Anicteric, conjunctiva pink, lids and lashes normal. No nasal deformity, discharge, epistaxis.  Lips moist, dentition normal, oropharynx moist, no oral lesions.   Skin: Warm and dry.  no jaundice.  No suspicious rashes or lesions. Cardiac: Tachycardic, regular, nl S1-S2, no murmurs appreciated.  Capillary refill is bris.  JVP not visible. no LE edema.  Radial  pulses 2+ and symmetric. Respiratory: Tachypneic, bibasilar Rales, no wheezing. Abdomen: Abdomen soft.  No TTP or guarding. No ascites, distension, hepatosplenomegaly.   MSK: No deformities or effusions. Neuro: Awake and alert.  EOMI, moves all extremities. Speech fluent.    Psych: Sensorium intact and responding to questions, attention normal. Affect normal, tired.  Judgment and insight appear normal.    Data Reviewed: I have personally reviewed following labs and imaging studies:  CBC: Recent Labs  Lab 12/09/19 0540 12/09/19 0540 12/11/19 0645 12/11/19 0645 12/12/19 0552 12/12/19 0552 12/13/19 0925 12/14/19 0242 12/14/19 0246 12/14/19 0555 12/15/19 0429  WBC 13.9*   < > 12.2*  --  11.7*  --  11.0* 15.1*  --   --  17.1*  NEUTROABS 11.6*  --  9.4*  --  8.9*  --  8.4* 11.8*  --   --   --   HGB 9.8*   < > 10.1*   < > 10.2*   < > 10.0* 10.6* 10.9* 10.2* 9.2*  HCT 30.9*   < > 32.6*   < > 32.8*   < > 31.7* 34.9* 32.0* 30.0* 29.4*  MCV 96.9   < > 98.5  --  97.0  --  97.8 99.4  --   --  97.0  PLT 417*   < > 381  --  361  --  361 415*  --   --  372   < > = values in this interval not displayed.   Basic Metabolic Panel: Recent Labs  Lab 12/11/19 0645 12/11/19 0645 12/12/19 3710 12/12/19 6269 12/13/19 4854 12/14/19 0242 12/14/19 0246 12/14/19 0555 12/15/19 0429  NA 136   < > 135   < > 135 134* 132* 134* 135  K 4.2   < > 3.5   < > 3.4* 4.6 4.1 4.4 4.1  CL 98  --  97*  --  97* 101  --   --  99  CO2 25  --  25  --  26 19*  --   --  24  GLUCOSE 118*  --  110*  --  127* 226*  --   --  161*  BUN 14  --  15  --  18 19  --   --  30*  CREATININE 0.63  --  0.48  --  0.56 1.03*  --   --  1.26*  CALCIUM 8.8*  --  8.8*  --  8.7* 8.7*  --   --  8.5*  PHOS  --   --   --   --  4.1  --   --   --   --    < > = values in this  interval not displayed.   GFR: Estimated Creatinine Clearance: 35.8 mL/min (A) (by C-G formula based on SCr of 1.26 mg/dL (H)). Liver Function Tests: Recent Labs    Lab 12/13/19 0925 12/14/19 0242  AST  --  31  ALT  --  18  ALKPHOS  --  257*  BILITOT  --  0.8  PROT  --  6.3*  ALBUMIN 2.3* 2.1*   No results for input(s): LIPASE, AMYLASE in the last 168 hours. No results for input(s): AMMONIA in the last 168 hours. Coagulation Profile: No results for input(s): INR, PROTIME in the last 168 hours. Cardiac Enzymes: No results for input(s): CKTOTAL, CKMB, CKMBINDEX, TROPONINI in the last 168 hours. BNP (last 3 results) No results for input(s): PROBNP in the last 8760 hours. HbA1C: No results for input(s): HGBA1C in the last 72 hours. CBG: No results for input(s): GLUCAP in the last 168 hours. Lipid Profile: No results for input(s): CHOL, HDL, LDLCALC, TRIG, CHOLHDL, LDLDIRECT in the last 72 hours. Thyroid Function Tests: No results for input(s): TSH, T4TOTAL, FREET4, T3FREE, THYROIDAB in the last 72 hours. Anemia Panel: No results for input(s): VITAMINB12, FOLATE, FERRITIN, TIBC, IRON, RETICCTPCT in the last 72 hours. Urine analysis:    Component Value Date/Time   COLORURINE YELLOW 12/15/2019 0258   APPEARANCEUR CLEAR 12/15/2019 0258   LABSPEC 1.017 12/15/2019 0258   PHURINE 5.0 12/15/2019 0258   GLUCOSEU NEGATIVE 12/15/2019 0258   HGBUR NEGATIVE 12/15/2019 0258   BILIRUBINUR NEGATIVE 12/15/2019 0258   BILIRUBINUR negative 01/23/2019 1136   KETONESUR NEGATIVE 12/15/2019 0258   PROTEINUR NEGATIVE 12/15/2019 0258   UROBILINOGEN 0.2 01/23/2019 1136   NITRITE NEGATIVE 12/15/2019 0258   LEUKOCYTESUR NEGATIVE 12/15/2019 0258   Sepsis Labs: @LABRCNTIP (procalcitonin:4,lacticacidven:4)  ) Recent Results (from the past 240 hour(s))  SARS Coronavirus 2 by RT PCR (hospital order, performed in [facility] hospital lab) Nasopharyngeal Nasopharyngeal Swab     Status: None   Collection Time: 12/07/19  4:17 PM   Specimen: Nasopharyngeal Swab  Result Value Ref Range Status   SARS Coronavirus 2 NEGATIVE NEGATIVE Final    Comment:  (NOTE) SARS-CoV-2 target nucleic acids are NOT DETECTED. The SARS-CoV-2 RNA is generally detectable in upper and lower respiratory specimens during the acute phase of infection. The lowest concentration of SARS-CoV-2 viral copies this assay can detect is 250 copies / mL. A negative result does not preclude SARS-CoV-2 infection and should not be used as the sole basis for treatment or other patient management decisions.  A negative result may occur with improper specimen collection / handling, submission of specimen other than nasopharyngeal swab, presence of viral mutation(s) within the areas targeted by this assay, and inadequate number of viral copies (<250 copies / mL). A negative result must be combined with clinical observations, patient history, and epidemiological information. Fact Sheet for Patients:   StrictlyIdeas.no Fact Sheet for Healthcare Providers: BankingDealers.co.za This test is not yet approved or cleared  by the [place] FDA and has been authorized for detection and/or diagnosis of SARS-CoV-2 by FDA under an Emergency Use Authorization (EUA).  This EUA will remain in effect (meaning this test can be used) for the duration of the COVID-19 declaration under Section 564(b)(1) of the Act, 21 U.S.C. section 360bbb-3(b)(1), unless the authorization is terminated or revoked sooner. Performed at [facility], [place] [address], [place], [place] [address]   Culture, blood (routine x 2) Call MD if unable to obtain prior to antibiotics being given  Status: None   Collection Time: 12/08/19 12:55 AM   Specimen: BLOOD  Result Value Ref Range Status   Specimen Description   Final    BLOOD RIGHT WRIST Performed at [place] [address], [place], [place] [address]    Special Requests   Final    BOTTLES DRAWN AEROBIC AND ANAEROBIC Blood Culture adequate volume Performed at [facility] [facility] [address] [place], [place] [address]    Culture   Final    NO GROWTH 5 DAYS Performed at [facility], [place] [address], [place], [place] [address]    Report Status 12/13/2019 FINAL  Final  SARS Coronavirus 2 by RT PCR (hospital order, performed in [facility] hospital lab) Nasopharyngeal Nasopharyngeal Swab     Status: None   Collection Time: 12/14/19  2:42 AM   Specimen: Nasopharyngeal Swab  Result Value Ref Range Status   SARS Coronavirus 2 NEGATIVE NEGATIVE Final    Comment: (NOTE) SARS-CoV-2 target nucleic acids are NOT DETECTED.  The SARS-CoV-2 RNA is generally detectable in upper and lower respiratory specimens during the acute phase of infection. The lowest concentration of SARS-CoV-2 viral copies this assay can detect is 250 copies / mL. A negative result does not preclude SARS-CoV-2 infection and should not be used as the sole basis for treatment or other patient management decisions.  A negative result may occur with improper specimen collection / handling, submission of specimen other than nasopharyngeal swab, presence of viral mutation(s) within the areas targeted by this assay, and inadequate number of viral copies (<250 copies / mL). A negative result must be combined with clinical observations, patient history, and epidemiological information.  Fact Sheet for Patients:   StrictlyIdeas.no  Fact Sheet for Healthcare Providers: BankingDealers.co.za  This test is not yet approved or  cleared by the [place] FDA and has been authorized for detection and/or diagnosis of SARS-CoV-2 by FDA under an Emergency Use Authorization (EUA).  This EUA will remain in effect (meaning this test can be used) for the duration of the COVID-19 declaration under Section 564(b)(1) of the Act, 21 U.S.C. section 360bbb-3(b)(1), unless the authorization is terminated or revoked sooner.  Performed at  [facility], [place] [address] [place], [place] [address]   Blood culture (routine x 2)     Status: None (Preliminary result)   Collection Time: 12/14/19  5:31 AM   Specimen: BLOOD  Result Value Ref Range Status   Specimen Description BLOOD LEFT ANTECUBITAL  Final   Special Requests   Final    BOTTLES DRAWN AEROBIC AND ANAEROBIC Blood Culture results may not be optimal due to an excessive volume of blood received in culture bottles   Culture   Final    NO GROWTH 1 DAY Performed at [facility], [place] [address], [place], [place] [address]    Report Status PENDING  Incomplete  Blood culture (routine x 2)     Status: None (Preliminary result)   Collection Time: 12/14/19  5:32 AM   Specimen: BLOOD RIGHT HAND  Result Value Ref Range Status   Specimen Description BLOOD RIGHT HAND  Final   Special Requests   Final    BOTTLES DRAWN AEROBIC AND ANAEROBIC Blood Culture results may not be optimal due to an inadequate volume of blood received in culture bottles   Culture   Final    NO GROWTH 1 DAY Performed at [facility], [place] [address],  [place], [place] [address]    Report Status PENDING  Incomplete         Radiology Studies: DG Chest Portable 1 View  Result Date: 12/14/2019 CLINICAL DATA:  Shortness of breath EXAM: PORTABLE CHEST 1 VIEW COMPARISON:  12/10/2019 FINDINGS: Moderate cardiomegaly with interstitial pulmonary edema and small pleural effusions. IMPRESSION: Cardiomegaly with small pleural effusions and interstitial pulmonary edema. Electronically Signed   By: Ulyses Jarred M.D.   On: 12/14/2019 03:26        Scheduled Meds: . docusate sodium  100 mg Oral BID  . enoxaparin (LOVENOX) injection  40 mg Subcutaneous Q24H  . gabapentin  300 mg Oral QHS  . ipratropium  0.5 mg Nebulization TID  . latanoprost  1 drop Both Eyes QHS  . levalbuterol  0.63 mg Nebulization TID  . magnesium hydroxide  15 mL Oral QHS  . methylPREDNISolone (SOLU-MEDROL) injection  60  mg Intravenous Q8H  . morphine CONCENTRATE  5-10 mg Sublingual Q4H  . oxymetazoline  1 spray Each Nare QHS  . sodium chloride flush  3 mL Intravenous Q12H  . timolol  1 drop Both Eyes BID   Continuous Infusions: . ceFEPime (MAXIPIME) IV 2 g (12/15/19 1037)  . vancomycin 750 mg (12/15/19 0859)     LOS: 1 day    Time spent: 35 minutes    Edwin Dada, MD [facility] 12/15/2019, 1:01 PM     Please page though [place] or Epic secure chat:  For [employer], Adult nurse

## 2019-12-15 NOTE — Progress Notes (Signed)
In and out cath performed per MD order using sterile technique. 2 RN's present (Rolf Fells and Ivana) 800 ml obtained pre bladder scan was 484. Urine Specimen sent for UA and Culture. Pt had small amount of milky discharge without odor, vaginal area intact. Peri care performed before and after insertion. Pt tolerated well

## 2019-12-15 NOTE — Progress Notes (Signed)
   12/14/19 2000  Vitals  BP (!) 94/24  MAP (mmHg) (!) 42  BP Location Right Arm  BP Method Automatic  Patient Position (if appropriate) Lying  Pulse Rate 88  Pulse Rate Source Monitor  ECG Heart Rate 88  Resp (!) 21  Oxygen Therapy  SpO2 95 %  O2 Device HFNC  O2 Flow Rate (L/min) 10 L/min  MEWS Score  MEWS Temp 0  MEWS Systolic 1  MEWS Pulse 0  MEWS RR 1  MEWS LOC 0  MEWS Score 2  MEWS Score Color Yellow  Not an acute change. Will continue to monitor pt

## 2019-12-16 ENCOUNTER — Inpatient Hospital Stay (HOSPITAL_COMMUNITY): Payer: Medicare Other

## 2019-12-16 DIAGNOSIS — J9602 Acute respiratory failure with hypercapnia: Secondary | ICD-10-CM

## 2019-12-16 DIAGNOSIS — J9601 Acute respiratory failure with hypoxia: Secondary | ICD-10-CM

## 2019-12-16 LAB — CBC
HCT: 32.6 % — ABNORMAL LOW (ref 36.0–46.0)
Hemoglobin: 10 g/dL — ABNORMAL LOW (ref 12.0–15.0)
MCH: 30 pg (ref 26.0–34.0)
MCHC: 30.7 g/dL (ref 30.0–36.0)
MCV: 97.9 fL (ref 80.0–100.0)
Platelets: 405 10*3/uL — ABNORMAL HIGH (ref 150–400)
RBC: 3.33 MIL/uL — ABNORMAL LOW (ref 3.87–5.11)
RDW: 14.8 % (ref 11.5–15.5)
WBC: 16.7 10*3/uL — ABNORMAL HIGH (ref 4.0–10.5)
nRBC: 0 % (ref 0.0–0.2)

## 2019-12-16 LAB — BASIC METABOLIC PANEL
Anion gap: 12 (ref 5–15)
BUN: 37 mg/dL — ABNORMAL HIGH (ref 8–23)
CO2: 25 mmol/L (ref 22–32)
Calcium: 8.6 mg/dL — ABNORMAL LOW (ref 8.9–10.3)
Chloride: 95 mmol/L — ABNORMAL LOW (ref 98–111)
Creatinine, Ser: 1.25 mg/dL — ABNORMAL HIGH (ref 0.44–1.00)
GFR calc Af Amer: 47 mL/min — ABNORMAL LOW (ref >60)
GFR calc non Af Amer: 41 mL/min — ABNORMAL LOW (ref >60)
Glucose, Bld: 157 mg/dL — ABNORMAL HIGH (ref 70–99)
Potassium: 4.2 mmol/L (ref 3.5–5.1)
Sodium: 132 mmol/L — ABNORMAL LOW (ref 135–145)

## 2019-12-16 LAB — URINE CULTURE: Culture: NO GROWTH

## 2019-12-16 MED ORDER — POTASSIUM CHLORIDE CRYS ER 20 MEQ PO TBCR
20.0000 meq | EXTENDED_RELEASE_TABLET | Freq: Every day | ORAL | Status: DC
  Administered 2019-12-17: 20 meq via ORAL
  Filled 2019-12-16: qty 1

## 2019-12-16 MED ORDER — VANCOMYCIN HCL IN DEXTROSE 1-5 GM/200ML-% IV SOLN
1000.0000 mg | INTRAVENOUS | Status: DC
  Administered 2019-12-17: 1000 mg via INTRAVENOUS
  Filled 2019-12-16: qty 200

## 2019-12-16 NOTE — Progress Notes (Signed)
Daily Progress Note   Patient Name: Connie Lang       Date: 12/16/2019 DOB: Jun 12, 1941  Age: 79 y.o. MRN#: [id number] Attending Physician: Edwin Dada, * Primary Care Physician: Laurey Morale, MD Admit Date: 12/14/2019  Reason for Consultation/Follow-up: symptom management, emotional support.   Subjective: Spoke with Connie Lang at bedside.  Connie Lang just drifted off to sleep.  Connie Lang states she ate a big breakfast this morning (bigger than she's had in a long time) and her urinary retention appears to have resolved today.    Per Connie Lang, Connie Lang discussed the plan of care yesterday with them - and they are in agreement.  Connie Lang anticipates discharge home in the next day or so.  He reports (happily) that both he and Connie Lang have had more sleep in the last 3 days than they have had in weeks.  Assessment: 24 yof with metastatic lung cancer - just admitted to hospice.  Readmitted with acute respiratory failure secondary to post obstructive pna as well as heart failure exacerbation.  Improving.   Length of Stay: 2   Vital Signs: BP (!) 108/56   Pulse 88   Temp 98.1 F (36.7 C)   Resp 16   Ht 5\' 4"  (1.626 m)   Wt 77.1 kg   LMP  (LMP Unknown) Comment: full  SpO2 93%   BMI 29.18 kg/m  SpO2: SpO2: 93 % O2 Device: O2 Device: High Flow Nasal Cannula O2 Flow Rate: O2 Flow Rate (L/min): 8 L/min       Palliative Assessment/Data: 30%     Palliative Care Plan    Recommendations/Plan:  Palliative will follow as needed.  Please give our office a call if more active engagement is needed.  Code Status:  DNR  Prognosis:   < 3 months   Discharge Planning:  Home with [place] was discussed with husband  Thank you for allowing the Palliative Medicine Team to assist  in the care of this patient.  Total time spent:  15 min.     Greater than 50%  of this time was spent counseling and coordinating care related to the above assessment and plan.  Florentina Jenny, PA-C Palliative Medicine  Please contact Palliative MedicineTeam phone at [phone] for questions and concerns between 7 am - 7 pm.  Please see AMION for individual provider pager numbers.

## 2019-12-16 NOTE — Progress Notes (Signed)
PROGRESS NOTE    Connie DONALSON  IHK:[id number] DOB: April 03, 1941 DOA: 12/14/2019 PCP: Laurey Morale, MD      Brief Narrative:  [name] is a 79 y.o. F with macular degeneration legally blind, LBBB, HTN, PMR and metastatic lung cancer on Hospice who presented with fever and hypoxia  Patient admitted for 5 days last week due to new onset dyspnea, found to have dense LLL post-obstructive pneumonia.  Treated with antibiotics and diuretics with only minimal improvement, only able to wean from 12 to 10L O2/min, and was discharged to home with Hospice following.   Unfortunately, after arriving home, patient developed dyspnea within a few hours, so severe that she was gasping for air and so came back to the ER.  In the ER, temp 100.38F, BP 80s/50s, WBC up to 17K.  CXR showed edema superimposed on known post-obstructive pneuonia.  Patient was started on vancomycin and cefepime and admitted to the hospital.      Assessment & Plan:  Acute hypoxic respiratory failure, suspect due to postobstructive pneumonia in the left lower lobe as well as acute on chronic diastolic CHF with pleural effusion Metastatic lung cancer Patient admitted and started on IV antibiotics and diuretics.  CXR today with 2 views, personally reviewed shows dense LLL infiltrate, also moderate sized pleural effusion.  Will attempt US thoracentesis tomorrow.  -Continue vancomycin and cefepime -Continue scheduled bronchodilators -Continue gabapentin and oral morphine for pain   Acute on chronic diastolic CHF Patient with bilateral opacities on CXR, elevated BNP.  Suspect a degree of her hypoxia is from CHF, effusions -Continue Lasix BID -Continue potassium supplement -Strict I/Os, daily weights, telemetry  -Daily monitoring renal function    AKI Cr stable at 1.2 with diuresis Suspect this may be congestive nephropathy -Trend BMP  Hypertension BP low to normal -Hold home amlodipine and  losartan  Anemia Due to malignancy, unchanged, no clinical bleeding  Glaucoma -Continue eyedrops   Urinary retention Patient has repeatedly had difficulty voiding, largely positional -qshift bladder scan and I/O cath for PVR > 300 -If I/O cath needed >24 hours, will place foley       Disposition: Status is: Inpatient  Remains inpatient appropriate because:The patient has end-stage lung cancer, worsening hypoxic respiratory failure which is severe needing 10 L of oxygen, as well as IV Lasix, possible thoracentesis, and antibiotics to prevent undue suffering at end-of-life   Dispo:  Patient From: Home  Planned Disposition: Home Hospice  Expected discharge date: 12/17/19  Medically stable for discharge: No                MDM: The below labs and imaging reports reviewed and summarized above.  Medication management as above. This is a severe acute illness   DVT prophylaxis: enoxaparin (LOVENOX) injection 40 mg Start: 12/14/19 1000  Code Status: DNR Family Communication: Husband    Consultants:     Procedures:     Antimicrobials:   Vancomycin and cefepime 6/11 >>   Culture data:   6/11 blood cutlure x2 -- ngtd  6/12 urine culture -- ng           Subjective: She is very dyspneic.  She has no confusion.  No new fever.  No vomiting.  She has chest pain on the left which is improved with morphine.       Objective: Vitals:   12/16/19 0800 12/16/19 1500 12/16/19 1600 12/16/19 1700  BP: (!) 108/56  117/72   Pulse: 88  81  Resp: 16  12 13   Temp: 98.1 F (36.7 C)  98.4 F (36.9 C)   TempSrc:   Oral   SpO2: 93% (!) 89% 93%   Weight:      Height:        Intake/Output Summary (Last 24 hours) at 12/16/2019 1717 Last data filed at 12/16/2019 0946 Gross per 24 hour  Intake 580 ml  Output 550 ml  Net 30 ml   Filed Weights   12/14/19 0240  Weight: 77.1 kg    Examination: General appearance: Elderly adult female, lying in bed,  interactive, appears very tired     HEENT: Anicteric, conjunctival pink, lids and lashes normal.  No nasal deformity, discharge, or epistaxis.  Hearing normal Skin: Warm and dry. Pale Cardiac: Heart, heart sounds distant, I do not appreciate lower extremity edema. Respiratory: Lung sounds diminished on the left, normal on the right.  No wheezing or rales.  Respiratory rate increased at rest. Abdomen:, No tenderness palpation or guarding, no ascites or distention. MSK:  Neuro: Alert and awake, extraocular movements intact, moves all extremities with general weakness, speech fluent. Psych: Sensorium intact responding questions, attention normal, affect blunted, judgment insight appear normal.     Data Reviewed: I have personally reviewed following labs and imaging studies:  CBC: Recent Labs  Lab 12/11/19 0645 12/11/19 0645 12/12/19 0552 12/12/19 0552 12/13/19 0925 12/13/19 0925 12/14/19 0242 12/14/19 0246 12/14/19 0555 12/15/19 0429 12/16/19 0634  WBC 12.2*   < > 11.7*  --  11.0*  --  15.1*  --   --  17.1* 16.7*  NEUTROABS 9.4*  --  8.9*  --  8.4*  --  11.8*  --   --   --   --   HGB 10.1*   < > 10.2*   < > 10.0*   < > 10.6* 10.9* 10.2* 9.2* 10.0*  HCT 32.6*   < > 32.8*   < > 31.7*   < > 34.9* 32.0* 30.0* 29.4* 32.6*  MCV 98.5   < > 97.0  --  97.8  --  99.4  --   --  97.0 97.9  PLT 381   < > 361  --  361  --  415*  --   --  372 405*   < > = values in this interval not displayed.   Basic Metabolic Panel: Recent Labs  Lab 12/12/19 0552 12/12/19 0552 12/13/19 0925 12/13/19 0925 12/14/19 0242 12/14/19 0246 12/14/19 0555 12/15/19 0429 12/16/19 0634  NA 135   < > 135   < > 134* 132* 134* 135 132*  K 3.5   < > 3.4*   < > 4.6 4.1 4.4 4.1 4.2  CL 97*  --  97*  --  101  --   --  99 95*  CO2 25  --  26  --  19*  --   --  24 25  GLUCOSE 110*  --  127*  --  226*  --   --  161* 157*  BUN 15  --  18  --  19  --   --  30* 37*  CREATININE 0.48  --  0.56  --  1.03*  --   --  1.26*  1.25*  CALCIUM 8.8*  --  8.7*  --  8.7*  --   --  8.5* 8.6*  PHOS  --   --  4.1  --   --   --   --   --   --    < > =  values in this interval not displayed.   GFR: Estimated Creatinine Clearance: 36.1 mL/min (A) (by C-G formula based on SCr of 1.25 mg/dL (H)). Liver Function Tests: Recent Labs  Lab 12/13/19 0925 12/14/19 0242  AST  --  31  ALT  --  18  ALKPHOS  --  257*  BILITOT  --  0.8  PROT  --  6.3*  ALBUMIN 2.3* 2.1*   No results for input(s): LIPASE, AMYLASE in the last 168 hours. No results for input(s): AMMONIA in the last 168 hours. Coagulation Profile: No results for input(s): INR, PROTIME in the last 168 hours. Cardiac Enzymes: No results for input(s): CKTOTAL, CKMB, CKMBINDEX, TROPONINI in the last 168 hours. BNP (last 3 results) No results for input(s): PROBNP in the last 8760 hours. HbA1C: No results for input(s): HGBA1C in the last 72 hours. CBG: No results for input(s): GLUCAP in the last 168 hours. Lipid Profile: No results for input(s): CHOL, HDL, LDLCALC, TRIG, CHOLHDL, LDLDIRECT in the last 72 hours. Thyroid Function Tests: No results for input(s): TSH, T4TOTAL, FREET4, T3FREE, THYROIDAB in the last 72 hours. Anemia Panel: No results for input(s): VITAMINB12, FOLATE, FERRITIN, TIBC, IRON, RETICCTPCT in the last 72 hours. Urine analysis:    Component Value Date/Time   COLORURINE YELLOW 12/15/2019 0258   APPEARANCEUR CLEAR 12/15/2019 0258   LABSPEC 1.017 12/15/2019 0258   PHURINE 5.0 12/15/2019 0258   GLUCOSEU NEGATIVE 12/15/2019 0258   HGBUR NEGATIVE 12/15/2019 0258   BILIRUBINUR NEGATIVE 12/15/2019 0258   BILIRUBINUR negative 01/23/2019 1136   KETONESUR NEGATIVE 12/15/2019 0258   PROTEINUR NEGATIVE 12/15/2019 0258   UROBILINOGEN 0.2 01/23/2019 1136   NITRITE NEGATIVE 12/15/2019 0258   LEUKOCYTESUR NEGATIVE 12/15/2019 0258   Sepsis Labs: @LABRCNTIP (procalcitonin:4,lacticacidven:4)  ) Recent Results (from the past 240 hour(s))  SARS  Coronavirus 2 by RT PCR (hospital order, performed in [facility] hospital lab) Nasopharyngeal Nasopharyngeal Swab     Status: None   Collection Time: 12/07/19  4:17 PM   Specimen: Nasopharyngeal Swab  Result Value Ref Range Status   SARS Coronavirus 2 NEGATIVE NEGATIVE Final    Comment: (NOTE) SARS-CoV-2 target nucleic acids are NOT DETECTED. The SARS-CoV-2 RNA is generally detectable in upper and lower respiratory specimens during the acute phase of infection. The lowest concentration of SARS-CoV-2 viral copies this assay can detect is 250 copies / mL. A negative result does not preclude SARS-CoV-2 infection and should not be used as the sole basis for treatment or other patient management decisions.  A negative result may occur with improper specimen collection / handling, submission of specimen other than nasopharyngeal swab, presence of viral mutation(s) within the areas targeted by this assay, and inadequate number of viral copies (<250 copies / mL). A negative result must be combined with clinical observations, patient history, and epidemiological information. Fact Sheet for Patients:   StrictlyIdeas.no Fact Sheet for Healthcare Providers: BankingDealers.co.za This test is not yet approved or cleared  by the [place] FDA and has been authorized for detection and/or diagnosis of SARS-CoV-2 by FDA under an Emergency Use Authorization (EUA).  This EUA will remain in effect (meaning this test can be used) for the duration of the COVID-19 declaration under Section 564(b)(1) of the Act, 21 U.S.C. section 360bbb-3(b)(1), unless the authorization is terminated or revoked sooner. Performed at [facility], [place] [address], [place], [place] [address]   Culture, blood (routine x 2) Call MD if unable to obtain prior to antibiotics being given  Status: None   Collection Time: 12/08/19 12:55 AM   Specimen: BLOOD   Result Value Ref Range Status   Specimen Description   Final    BLOOD RIGHT WRIST Performed at [facility] [address] [place], [place] [address]    Special Requests   Final    BOTTLES DRAWN AEROBIC AND ANAEROBIC Blood Culture adequate volume Performed at [facility] [address] [place], [place] [address]    Culture   Final    NO GROWTH 5 DAYS Performed at [facility], [place] [address], [place], [place] [address]    Report Status 12/13/2019 FINAL  Final  SARS Coronavirus 2 by RT PCR (hospital order, performed in [facility] hospital lab) Nasopharyngeal Nasopharyngeal Swab     Status: None   Collection Time: 12/14/19  2:42 AM   Specimen: Nasopharyngeal Swab  Result Value Ref Range Status   SARS Coronavirus 2 NEGATIVE NEGATIVE Final    Comment: (NOTE) SARS-CoV-2 target nucleic acids are NOT DETECTED.  The SARS-CoV-2 RNA is generally detectable in upper and lower respiratory specimens during the acute phase of infection. The lowest concentration of SARS-CoV-2 viral copies this assay can detect is 250 copies / mL. A negative result does not preclude SARS-CoV-2 infection and should not be used as the sole basis for treatment or other patient management decisions.  A negative result may occur with improper specimen collection / handling, submission of specimen other than nasopharyngeal swab, presence of viral mutation(s) within the areas targeted by this assay, and inadequate number of viral copies (<250 copies / mL). A negative result must be combined with clinical observations, patient history, and epidemiological information.  Fact Sheet for Patients:   StrictlyIdeas.no  Fact Sheet for Healthcare Providers: BankingDealers.co.za  This test is not yet approved or  cleared by the [place] FDA and has been authorized for detection and/or diagnosis of SARS-CoV-2 by FDA under  an Emergency Use Authorization (EUA).  This EUA will remain in effect (meaning this test can be used) for the duration of the COVID-19 declaration under Section 564(b)(1) of the Act, 21 U.S.C. section 360bbb-3(b)(1), unless the authorization is terminated or revoked sooner.  Performed at [facility], [place] [address], [place], [place] [address]   Blood culture (routine x 2)     Status: None (Preliminary result)   Collection Time: 12/14/19  5:31 AM   Specimen: BLOOD  Result Value Ref Range Status   Specimen Description BLOOD LEFT ANTECUBITAL  Final   Special Requests   Final    BOTTLES DRAWN AEROBIC AND ANAEROBIC Blood Culture results may not be optimal due to an excessive volume of blood received in culture bottles   Culture   Final    NO GROWTH 2 DAYS Performed at [facility], [place] [address] [place], [place] [address]    Report Status PENDING  Incomplete  Blood culture (routine x 2)     Status: None (Preliminary result)   Collection Time: 12/14/19  5:32 AM   Specimen: BLOOD RIGHT HAND  Result Value Ref Range Status   Specimen Description BLOOD RIGHT HAND  Final   Special Requests   Final    BOTTLES DRAWN AEROBIC AND ANAEROBIC Blood Culture results may not be optimal due to an inadequate volume of blood received in culture bottles   Culture   Final    NO GROWTH 2 DAYS Performed at [facility], [place] [address]  [place], [place] [address]    Report Status PENDING  Incomplete  Culture, Urine     Status: None   Collection Time: 12/15/19  2:41 AM   Specimen: Urine, Random  Result Value Ref Range Status   Specimen Description URINE, RANDOM  Final   Special Requests NONE  Final   Culture   Final    NO GROWTH Performed at [facility], [place] [address], [place], [place] [address]    Report Status 12/16/2019 FINAL  Final         Radiology Studies: DG Chest 2 View  Result Date: 12/16/2019 CLINICAL DATA:  Follow-up pleural effusion EXAM: CHEST - 2 VIEW  COMPARISON:  12/14/2019 FINDINGS: Check shadow remains enlarged. Aortic calcifications are again seen. Postsurgical changes in the midthoracic spine are again noted. Patchy airspace opacity is noted in the right base as well as in the left mid lung and left retrocardiac region. Left pleural effusion is noted. These opacities are somewhat more focal than that noted on the prior exam no acute bony abnormality is seen. IMPRESSION: Slight decrease in vascular congestion with more focal airspace opacities consistent with multifocal pneumonia. Persistent left effusion is noted. Electronically Signed   By: Inez Catalina M.D.   On: 12/16/2019 15:24        Scheduled Meds: . docusate sodium  100 mg Oral BID  . enoxaparin (LOVENOX) injection  40 mg Subcutaneous Q24H  . furosemide  40 mg Intravenous BID  . gabapentin  300 mg Oral QHS  . ipratropium  0.5 mg Nebulization TID  . latanoprost  1 drop Both Eyes QHS  . levalbuterol  0.63 mg Nebulization TID  . morphine CONCENTRATE  5-10 mg Sublingual Q4H  . oxymetazoline  1 spray Each Nare QHS  . [START ON 12/17/2019] potassium chloride  20 mEq Oral Daily  . sodium chloride flush  3 mL Intravenous Q12H  . timolol  1 drop Both Eyes BID   Continuous Infusions: . ceFEPime (MAXIPIME) IV 2 g (12/16/19 0946)  . vancomycin       LOS: 2 days    Time spent: 35 minutes    Edwin Dada, MD [facility] 12/16/2019, 5:17 PM     Please page though [place] or Epic secure chat:  For [employer], Adult nurse

## 2019-12-16 NOTE — Progress Notes (Signed)
[facility] Documentation  ACC has an admission visit holding for pt tomorrow if pt discharges. Writer spoke with daughter and all DME is in the home.   Please reach out with any questions.   Thank you,  Freddie Breech, RN  [facility] Liaison [phone]

## 2019-12-16 NOTE — Progress Notes (Signed)
Monitor tech reports rhythm in A-fib with rate of 110-115.  Pt is asymptomatic.  Pulse tends to be more tachy with increased movement.

## 2019-12-16 NOTE — Progress Notes (Signed)
PHARMACY NOTE:  ANTIMICROBIAL RENAL DOSAGE ADJUSTMENT  Current antimicrobial regimen includes a mismatch between antimicrobial dosage and estimated renal function.  As per policy approved by the Pharmacy & Therapeutics and Medical Executive Committees, the antimicrobial dosage will be adjusted accordingly.  Current antimicrobial dosage:  Vancomycin 750 mg IV every 12 hours   Indication: Sepsis 2/2 pneumonia  Renal Function:   Estimated Creatinine Clearance: 36.1 mL/min (A) (by C-G formula based on SCr of 1.25 mg/dL (H)).     Antimicrobial dosage has been changed to:  Vancomycin 1000 mg IV every 24 hours    Thank you for allowing pharmacy to be a part of this patient's care.  Acey Lav, PharmD  PGY1 Acute Care Pharmacy Resident 12/16/2019 9:20 AM

## 2019-12-17 ENCOUNTER — Inpatient Hospital Stay (HOSPITAL_COMMUNITY): Payer: Medicare Other

## 2019-12-17 HISTORY — PX: IR THORACENTESIS ASP PLEURAL SPACE W/IMG GUIDE: IMG5380

## 2019-12-17 LAB — MRSA PCR SCREENING: MRSA by PCR: NEGATIVE

## 2019-12-17 LAB — COMPREHENSIVE METABOLIC PANEL
ALT: 21 U/L (ref 0–44)
AST: 24 U/L (ref 15–41)
Albumin: 2 g/dL — ABNORMAL LOW (ref 3.5–5.0)
Alkaline Phosphatase: 231 U/L — ABNORMAL HIGH (ref 38–126)
Anion gap: 11 (ref 5–15)
BUN: 47 mg/dL — ABNORMAL HIGH (ref 8–23)
CO2: 26 mmol/L (ref 22–32)
Calcium: 8.6 mg/dL — ABNORMAL LOW (ref 8.9–10.3)
Chloride: 95 mmol/L — ABNORMAL LOW (ref 98–111)
Creatinine, Ser: 1.55 mg/dL — ABNORMAL HIGH (ref 0.44–1.00)
GFR calc Af Amer: 36 mL/min — ABNORMAL LOW (ref >60)
GFR calc non Af Amer: 31 mL/min — ABNORMAL LOW (ref >60)
Glucose, Bld: 160 mg/dL — ABNORMAL HIGH (ref 70–99)
Potassium: 4.3 mmol/L (ref 3.5–5.1)
Sodium: 132 mmol/L — ABNORMAL LOW (ref 135–145)
Total Bilirubin: 0.5 mg/dL (ref 0.3–1.2)
Total Protein: 6.3 g/dL — ABNORMAL LOW (ref 6.5–8.1)

## 2019-12-17 LAB — CBC
HCT: 32.9 % — ABNORMAL LOW (ref 36.0–46.0)
Hemoglobin: 10.1 g/dL — ABNORMAL LOW (ref 12.0–15.0)
MCH: 30.3 pg (ref 26.0–34.0)
MCHC: 30.7 g/dL (ref 30.0–36.0)
MCV: 98.8 fL (ref 80.0–100.0)
Platelets: 407 10*3/uL — ABNORMAL HIGH (ref 150–400)
RBC: 3.33 MIL/uL — ABNORMAL LOW (ref 3.87–5.11)
RDW: 15.2 % (ref 11.5–15.5)
WBC: 14.5 10*3/uL — ABNORMAL HIGH (ref 4.0–10.5)
nRBC: 0 % (ref 0.0–0.2)

## 2019-12-17 LAB — PROTEIN, PLEURAL OR PERITONEAL FLUID: Total protein, fluid: <3 g/dL

## 2019-12-17 LAB — GRAM STAIN

## 2019-12-17 LAB — LACTATE DEHYDROGENASE, PLEURAL OR PERITONEAL FLUID: LD, Fluid: 77 U/L — ABNORMAL HIGH (ref 3–23)

## 2019-12-17 LAB — LACTATE DEHYDROGENASE: LDH: 225 U/L — ABNORMAL HIGH (ref 98–192)

## 2019-12-17 MED ORDER — AMOXICILLIN-POT CLAVULANATE 875-125 MG PO TABS
1.0000 | ORAL_TABLET | Freq: Two times a day (BID) | ORAL | 0 refills | Status: AC
Start: 2019-12-17 — End: 2019-12-31

## 2019-12-17 MED ORDER — FUROSEMIDE 40 MG PO TABS: 40.0000 mg | ORAL_TABLET | Freq: Two times a day (BID) | ORAL | Status: DC

## 2019-12-17 MED ORDER — LIDOCAINE HCL 1 % IJ SOLN
INTRAMUSCULAR | Status: AC
  Filled 2019-12-17: qty 20

## 2019-12-17 MED ORDER — LIDOCAINE HCL (PF) 1 % IJ SOLN
INTRAMUSCULAR | Status: DC | PRN
  Administered 2019-12-17: 5 mL

## 2019-12-17 MED ORDER — MORPHINE SULFATE (CONCENTRATE) 10 MG/0.5ML PO SOLN: 5.0000 mg | ORAL | 0 refills | Status: AC

## 2019-12-17 MED ORDER — FUROSEMIDE 40 MG PO TABS: 40.0000 mg | ORAL_TABLET | Freq: Every day | ORAL | 3 refills | Status: AC

## 2019-12-17 NOTE — Procedures (Signed)
PROCEDURE SUMMARY:  Successful US guided left thoracentesis. Yielded 300 mL of yellow, clear fluid. Pt tolerated procedure well. No immediate complications.  Specimen was sent for labs. CXR ordered.  EBL < 5 mL  Docia Barrier PA-C 12/17/2019 1:29 PM

## 2019-12-17 NOTE — TOC Transition Note (Signed)
Transition of Care [facility]) - CM/SW Discharge Note   Patient Details  Name: Connie Lang MRN: [id number] Date of Birth: [birth year]  Transition of [facility]) CM/SW Contact:  Bethann Berkshire, [place] Phone Number: 12/17/2019, 2:50 PM   Clinical Narrative:     CSW met with pt husband bedside and confirmed plan to return home with hospice through [facility]. CSW explained transition process and that he would schedule PTAR.   Patient will DC to: Home with Hospice Anticipated DC date: 12/17/19 Family notified: Daishia, Fetterly (Spouse)  [phone] ([place]) Transport by: Corey Harold   Per MD patient ready for DC to home with [place] . RN, patient, patient's family, [facility] are notified of DC. Ambulance transport requested for patient.   CSW will sign off for now as social work intervention is no longer needed. Please consult [place] again if new needs arise.   Final next level of care: Home w Hospice Care Barriers to Discharge: No Barriers Identified   Patient Goals and CMS Choice        Discharge Placement              Patient chooses bed at:  (Home with hospice) Patient to be transferred to facility by: [facility] Name of family member notified: Elisandra, Deshmukh (Spouse) [phone] ([place]) Patient and family notified of of transfer: 12/17/19  Discharge Plan and Services                            [place]:  Lonia Chimera) Date HH Agency Contacted: 12/17/19      Social Determinants of Health (SDOH) Interventions     Readmission Risk Interventions No flowsheet data found.

## 2019-12-17 NOTE — Plan of Care (Signed)
  Problem: Education: Goal: Knowledge of General Education information will improve Description: Including pain rating scale, medication(s)/side effects and non-pharmacologic comfort measures 12/17/2019 0501 by Loma Messing, RN Outcome: Not Progressing 12/17/2019 0457 by Loma Messing, RN Outcome: Not Progressing   Problem: Health Behavior/Discharge Planning: Goal: Ability to manage health-related needs will improve 12/17/2019 0501 by Loma Messing, RN Outcome: Not Progressing 12/17/2019 0457 by Loma Messing, RN Outcome: Not Progressing   Problem: Clinical Measurements: Goal: Ability to maintain clinical measurements within normal limits will improve 12/17/2019 0501 by Loma Messing, RN Outcome: Not Progressing 12/17/2019 0457 by Loma Messing, RN Outcome: Not Progressing Goal: Will remain free from infection 12/17/2019 0501 by Loma Messing, RN Outcome: Not Progressing 12/17/2019 0457 by Loma Messing, RN Outcome: Not Progressing Goal: Diagnostic test results will improve 12/17/2019 0501 by Loma Messing, RN Outcome: Not Progressing 12/17/2019 0457 by Loma Messing, RN Outcome: Not Progressing Goal: Respiratory complications will improve 12/17/2019 0501 by Loma Messing, RN Outcome: Not Progressing 12/17/2019 0457 by Karsten Fells D, RN Outcome: Not Progressing Goal: Cardiovascular complication will be avoided 12/17/2019 0501 by Loma Messing, RN Outcome: Not Progressing 12/17/2019 0457 by Loma Messing, RN Outcome: Not Progressing   Problem: Activity: Goal: Risk for activity intolerance will decrease 12/17/2019 0501 by Loma Messing, RN Outcome: Not Progressing 12/17/2019 0457 by Loma Messing, RN Outcome: Not Progressing   Problem: Nutrition: Goal: Adequate nutrition will be maintained 12/17/2019 0501 by Loma Messing, RN Outcome: Not Progressing 12/17/2019 0457 by Karsten Fells D,  RN Outcome: Not Progressing   Problem: Coping: Goal: Level of anxiety will decrease 12/17/2019 0501 by Loma Messing, RN Outcome: Not Progressing 12/17/2019 0457 by Karsten Fells D, RN Outcome: Not Progressing   Problem: Elimination: Goal: Will not experience complications related to bowel motility 12/17/2019 0501 by Loma Messing, RN Outcome: Not Progressing 12/17/2019 0457 by Loma Messing, RN Outcome: Not Progressing Goal: Will not experience complications related to urinary retention 12/17/2019 0501 by Loma Messing, RN Outcome: Not Progressing 12/17/2019 0457 by Loma Messing, RN Outcome: Not Progressing   Problem: Pain Managment: Goal: General experience of comfort will improve 12/17/2019 0501 by Loma Messing, RN Outcome: Not Progressing 12/17/2019 0457 by Loma Messing, RN Outcome: Not Progressing   Problem: Safety: Goal: Ability to remain free from injury will improve 12/17/2019 0501 by Loma Messing, RN Outcome: Not Progressing 12/17/2019 0457 by Karsten Fells D, RN Outcome: Not Progressing   Problem: Skin Integrity: Goal: Risk for impaired skin integrity will decrease 12/17/2019 0501 by Loma Messing, RN Outcome: Not Progressing 12/17/2019 0457 by Loma Messing, RN Outcome: Not Progressing

## 2019-12-17 NOTE — Discharge Summary (Signed)
Physician Discharge Summary  Connie Lang XBW:[id number] DOB: 02/29/1941 DOA: 12/14/2019  PCP: Laurey Morale, MD  Admit date: 12/14/2019 Discharge date: 12/17/2019  Admitted From: Home  Disposition:  Home with Hospice   Recommendations for Outpatient Follow-up:  1. Hospice: Please obtain CXR in 1 week and repeat thoracentesis or place Pleur-X if recurrent effusion     Home Health: Hospice  Equipment/Devices: O2  Discharge Condition: Declining, likely days to weeks prognosis  CODE STATUS: DNR Diet recommendation: As desired  Brief/Interim Summary: Connie Lang is a 79 y.o. F with macular degeneration legally blind, LBBB, HTN, PMR and metastatic lung cancer on Hospice who presented with fever and hypoxia  Patient admitted for 5 days last week due to new onset dyspnea, found to have dense LLL post-obstructive pneumonia.  Treated with antibiotics and diuretics with only minimal improvement, only able to wean from 12 to 10L O2/min, and was discharged to home with Hospice following.   Unfortunately, after arriving home, patient developed dyspnea within a few hours, so severe that she was gasping for air and so came back to the ER.  In the ER, temp 100.69F, BP 80s/50s, WBC up to 17K.  CXR showed edema superimposed on known post-obstructive pneuonia.  Patient was started on vancomycin and cefepime and admitted to the hospital.        [place] DIAGNOSIS: Acute hypoxic respiratory failure due to CHF and post-obstructive pneumonia and pleural effusion    Discharge Diagnoses:   Acute hypoxic respiratory failure, suspect due to postobstructive pneumonia in the left lower lobe as well as acute on chronic diastolic CHF with pleural effusion Metastatic lung cancer Patient admitted and started on IV antibiotics and diuretics.  Sepsis was ruled out.  I suspect her respiratory failure was not from septic lung injury.  She was 2 days, and then had small bump in  creatinine, diuretics stopped.  2 view chest x-ray showed moderate-sized pleural effusion, US thoracentesis was obtained, removed 500 cc fluid after which she had symptomatic relief, and was able to weaned from 8 to 5 L of oxygen.    At the time of discharge, she had weaned her oxygen somewhat, has been diuresed to the extent possible, and her pleural patient has been drained.  I suspect that her cancer is spreading and that she has days to weaks prognosis.  I would recommend continued antibiotics indefinitely, repeat CXR in 1 week or if hypoxia worsens to consider repeat thoracentesis.  She may not live that long and care could pivot soon to pure comfort measures.  I discussed with daughter.     Acute on chronic diastolic CHF    AKI  Hypertension Hold antihypertensives   Anemia Due to malignancy, unchanged, no clinical bleeding  Glaucoma Continue eyedrops             Discharge Instructions  Discharge Instructions    Discharge instructions   Complete by: As directed    From Dr. Loleta Books: You were admitted for lung failure and low oxygen.  Your lungs were failing partly because the oxygen tubing failed, but partly because of how sick your lungs are with infection and fluid.  The fluid is from congestive heart failure. It had accumulated inside and around the lungs.   The fluid IN the lungs was removed to the extent that I could by using furosemide/Lasix  You should resume taking Lasix once daily as you were before.  Take it right before lunch  The fluid AROUND the lungs  was removed with the thoracentesis  [facility] should repeat a chest x-ray in 5-6 days and repeat the thoracentesis or place a special catheter if able.   For the infection, you were treated with antibiotics  You should continue Augmentin 875-125 mg twice daily  Take this for at least 2 weeks I recommend that Hospice consider continuing it indefinitely if they feel reasonable  Use your  other home medicines but STOP amlodipine and losartan. Your blood pressure has been low and I do not think this is wise.     Allergies as of 12/17/2019      Reactions   Temazepam Other (See Comments)   Comatose requiring Narcan      Medication List    STOP taking these medications   amLODipine 5 MG tablet Commonly known as: NORVASC   losartan 50 MG tablet Commonly known as: COZAAR     TAKE these medications   acetaminophen 650 MG CR tablet Commonly known as: TYLENOL Take 650-1,300 mg by mouth every 8 (eight) hours as needed for pain.   amoxicillin-clavulanate 875-125 MG tablet Commonly known as: Augmentin Take 1 tablet by mouth 2 (two) times daily for 14 days.   anti-nausea solution Take 10 mLs by mouth every 15 (fifteen) minutes as needed for nausea or vomiting.   chlorpheniramine 4 MG tablet Commonly known as: CHLOR-TRIMETON Take 4 mg by mouth daily as needed for allergies.   cyclobenzaprine 10 MG tablet Commonly known as: FLEXERIL Take 1 tablet by mouth three times daily as needed for muscle spasm   diphenhydrAMINE 12.5 MG chewable tablet Commonly known as: BENADRYL Chew 12.5 mg by mouth 2 (two) times daily as needed (for allergic reaction (lip swelling)).   docusate sodium 100 MG capsule Commonly known as: COLACE Take 1 capsule (100 mg total) by mouth 2 (two) times daily.   furosemide 40 MG tablet Commonly known as: LASIX Take 1 tablet (40 mg total) by mouth daily.   gabapentin 300 MG capsule Commonly known as: NEURONTIN Take 1 capsule (300 mg total) by mouth at bedtime.   ibuprofen 200 MG tablet Commonly known as: ADVIL Take 400 mg by mouth every 8 (eight) hours as needed (for pain).   ipratropium 0.02 % nebulizer solution Commonly known as: ATROVENT Take 2.5 mLs (0.5 mg total) by nebulization 3 (three) times daily for 14 days.   levalbuterol 0.63 MG/3ML nebulizer solution Commonly known as: XOPENEX Take 3 mLs (0.63 mg total) by nebulization 3  (three) times daily for 14 days.   morphine CONCENTRATE 10 MG/0.5ML Soln concentrated solution Take 0.25 mLs (5 mg total) by mouth every 4 (four) hours.   Narcan 4 MG/0.1ML Liqd nasal spray kit Generic drug: naloxone Place 1 spray into the nose as needed (for overdose).   oxymetazoline 0.05 % nasal spray Commonly known as: AFRIN Place 1 spray into both nostrils at bedtime.   pantoprazole 40 MG tablet Commonly known as: PROTONIX Take 1 tablet by mouth once daily   PRESERVISION AREDS 2 PO Take 1 tablet by mouth 2 (two) times daily.   Systane 0.4-0.3 % Soln Generic drug: Polyethyl Glycol-Propyl Glycol Place 1 drop into both eyes 3 (three) times daily as needed (dry/irritated eyes.).   timolol 0.5 % ophthalmic solution Commonly known as: TIMOPTIC Place 1 drop into both eyes 2 (two) times daily.   Travatan Z 0.004 % Soln ophthalmic solution Generic drug: Travoprost (BAK Free) Place 1 drop into both eyes at bedtime.   Tussin 100 MG/5ML syrup Generic drug:  guaifenesin Take 200 mg by mouth 3 (three) times daily as needed for cough.   vitamin B-12 1000 MCG tablet Commonly known as: CYANOCOBALAMIN Take 2,000 mcg by mouth daily at 3 pm.       Follow-up Information    [facility] Follow up.   Contact information: [place] [address] [phone]             Allergies  Allergen Reactions  . Temazepam Other (See Comments)    Comatose requiring Narcan    Consultations:  Palliative Care  Hospice   Procedures/Studies: DG Chest 1 View  Result Date: 12/17/2019 CLINICAL DATA:  Status post thoracentesis EXAM: CHEST  1 VIEW COMPARISON:  July 18, 2019 FINDINGS: No pneumothorax. Left pleural effusion is smaller following thoracentesis. There is airspace opacity in the left mid lung. There is mild atelectasis in the right base. Right lung otherwise clear. There is cardiomegaly with pulmonary vascularity normal. No adenopathy.  There is aortic atherosclerosis. There is postoperative change in the thoracic spine at multiple levels. IMPRESSION: No pneumothorax. Pleural effusion on the left much smaller following thoracentesis. Airspace opacity in the left mid lung in part may be due to loculated pleural effusion. A focal area of pneumonia in this area must be of concern, however. Right lung clear except for mild right base atelectasis. Stable cardiomegaly. No adenopathy. Aortic Atherosclerosis (ICD10-I70.0). Electronically Signed   By: Lowella Grip III M.D.   On: 12/17/2019 13:39   DG Chest 1 View  Result Date: 12/10/2019 CLINICAL DATA:  Dyspnea EXAM: CHEST  1 VIEW COMPARISON:  12/07/2019 FINDINGS: Cardiac shadow is enlarged but stable. Aortic calcifications are again seen. Changes of prior fusion in the midthoracic spine are noted. Persistent bibasilar opacities are noted right slightly greater than left. No sizable effusion is seen. No bony abnormality is noted. IMPRESSION: Persistent bibasilar opacities as described. Electronically Signed   By: Inez Catalina M.D.   On: 12/10/2019 09:48   DG Chest 2 View  Result Date: 12/16/2019 CLINICAL DATA:  Follow-up pleural effusion EXAM: CHEST - 2 VIEW COMPARISON:  12/14/2019 FINDINGS: Check shadow remains enlarged. Aortic calcifications are again seen. Postsurgical changes in the midthoracic spine are again noted. Patchy airspace opacity is noted in the right base as well as in the left mid lung and left retrocardiac region. Left pleural effusion is noted. These opacities are somewhat more focal than that noted on the prior exam no acute bony abnormality is seen. IMPRESSION: Slight decrease in vascular congestion with more focal airspace opacities consistent with multifocal pneumonia. Persistent left effusion is noted. Electronically Signed   By: Inez Catalina M.D.   On: 12/16/2019 15:24   DG Chest Portable 1 View  Result Date: 12/14/2019 CLINICAL DATA:  Shortness of breath EXAM:  PORTABLE CHEST 1 VIEW COMPARISON:  12/10/2019 FINDINGS: Moderate cardiomegaly with interstitial pulmonary edema and small pleural effusions. IMPRESSION: Cardiomegaly with small pleural effusions and interstitial pulmonary edema. Electronically Signed   By: Ulyses Jarred M.D.   On: 12/14/2019 03:26   DG Chest Portable 1 View  Result Date: 12/07/2019 CLINICAL DATA:  Shortness of breath. EXAM: PORTABLE CHEST 1 VIEW COMPARISON:  June 13, 2019 FINDINGS: Mild diffuse chronic appearing increased lung markings are seen. Mild to moderate severity areas of atelectasis and/or infiltrate are seen within the bilateral lung bases, right greater than left. There is no evidence of a pleural effusion or pneumothorax. The cardiac silhouette is moderately enlarged. There is marked severity calcification of the  aortic arch. Bilateral pedicle screws are seen within the midthoracic spine. This represents a new finding when compared to the prior exam. IMPRESSION: 1. Chronic appearing increased lung markings with mild to moderate severity bibasilar atelectasis and/or infiltrate, right greater than left. 2. Interval mid thoracic spine fusion. Electronically Signed   By: Virgina Norfolk M.D.   On: 12/07/2019 16:54       Subjective: Patient is tired.  Her breathing feels better and her chest pain is slightly less after the thoracentesis.  She still has moderate chest pain in the left, relieved with morphine.  Discharge Exam: Vitals:   12/17/19 0758 12/17/19 1212  BP: 121/64 92/60  Pulse:    Resp:    Temp:  98.1 F (36.7 C)  SpO2: (!) 88%    Vitals:   12/17/19 0329 12/17/19 0724 12/17/19 0758 12/17/19 1212  BP: (!) 99/55  121/64 92/60  Pulse: 95 (!) 104    Resp: 18 18    Temp: 97.7 F (36.5 C)   98.1 F (36.7 C)  TempSrc: Oral   Oral  SpO2:  92% (!) 88%   Weight:      Height:        General: Pt is sleepy after recent dose of morphine, but interactive, not in acute distress Cardiovascular: Tachycardic,  regular, nl S1-S2, no murmurs appreciated.   No LE edema.   Respiratory: Respirations fairly shallow, respiratory rate higher than normal.  Lung sounds diminished bilaterally.  Rales on the left.  Abdominal: Abdomen soft and non-tender.  No distension or HSM.   Neuro/Psych: Strength symmetric in upper and lower extremities.  Judgment and insight appear normal.   The results of significant diagnostics from this hospitalization (including imaging, microbiology, ancillary and laboratory) are listed below for reference.     Microbiology: Recent Results (from the past 240 hour(s))  Culture, blood (routine x 2) Call MD if unable to obtain prior to antibiotics being given     Status: None   Collection Time: 12/08/19 12:55 AM   Specimen: BLOOD  Result Value Ref Range Status   Specimen Description   Final    BLOOD RIGHT WRIST Performed at [facility] [address], [place], [place] [address]    Special Requests   Final    BOTTLES DRAWN AEROBIC AND ANAEROBIC Blood Culture adequate volume Performed at [facility] [address] [place], [place] [address]    Culture   Final    NO GROWTH 5 DAYS Performed at [facility], [place] [address] [place], [place] [address]    Report Status 12/13/2019 FINAL  Final  SARS Coronavirus 2 by RT PCR (hospital order, performed in [facility] hospital lab) Nasopharyngeal Nasopharyngeal Swab     Status: None   Collection Time: 12/14/19  2:42 AM   Specimen: Nasopharyngeal Swab  Result Value Ref Range Status   SARS Coronavirus 2 NEGATIVE NEGATIVE Final    Comment: (NOTE) SARS-CoV-2 target nucleic acids are NOT DETECTED.  The SARS-CoV-2 RNA is generally detectable in upper and lower respiratory specimens during the acute phase of infection. The lowest concentration of SARS-CoV-2 viral copies this assay can detect is 250 copies / mL. A negative result does not preclude SARS-CoV-2 infection and should not be used  as the sole basis for treatment or other patient management decisions.  A negative result may occur with improper specimen collection / handling, submission of specimen other than nasopharyngeal swab, presence of viral mutation(s) within the  areas targeted by this assay, and inadequate number of viral copies (<250 copies / mL). A negative result must be combined with clinical observations, patient history, and epidemiological information.  Fact Sheet for Patients:   StrictlyIdeas.no  Fact Sheet for Healthcare Providers: BankingDealers.co.za  This test is not yet approved or  cleared by the [place] FDA and has been authorized for detection and/or diagnosis of SARS-CoV-2 by FDA under an Emergency Use Authorization (EUA).  This EUA will remain in effect (meaning this test can be used) for the duration of the COVID-19 declaration under Section 564(b)(1) of the Act, 21 U.S.C. section 360bbb-3(b)(1), unless the authorization is terminated or revoked sooner.  Performed at [facility], [place] [address] [place], [place] [address]   Blood culture (routine x 2)     Status: None (Preliminary result)   Collection Time: 12/14/19  5:31 AM   Specimen: BLOOD  Result Value Ref Range Status   Specimen Description BLOOD LEFT ANTECUBITAL  Final   Special Requests   Final    BOTTLES DRAWN AEROBIC AND ANAEROBIC Blood Culture results may not be optimal due to an excessive volume of blood received in culture bottles   Culture   Final    NO GROWTH 3 DAYS Performed at [facility], [place] [address], [place], [place] [address]    Report Status PENDING  Incomplete  Blood culture (routine x 2)     Status: None (Preliminary result)   Collection Time: 12/14/19  5:32 AM   Specimen: BLOOD RIGHT HAND  Result Value Ref Range Status   Specimen Description BLOOD RIGHT HAND  Final   Special Requests   Final    BOTTLES DRAWN AEROBIC AND ANAEROBIC Blood  Culture results may not be optimal due to an inadequate volume of blood received in culture bottles   Culture   Final    NO GROWTH 3 DAYS Performed at [facility], [place] [address], [place], [place] [address]    Report Status PENDING  Incomplete  Culture, Urine     Status: None   Collection Time: 12/15/19  2:41 AM   Specimen: Urine, Random  Result Value Ref Range Status   Specimen Description URINE, RANDOM  Final   Special Requests NONE  Final   Culture   Final    NO GROWTH Performed at [facility], [place] [address], [place], [place] [address]    Report Status 12/16/2019 FINAL  Final  MRSA PCR Screening     Status: None   Collection Time: 12/17/19 11:23 AM   Specimen: Nasal Mucosa; Nasopharyngeal  Result Value Ref Range Status   MRSA by PCR NEGATIVE NEGATIVE Final    Comment:        The GeneXpert MRSA Assay (FDA approved for NASAL specimens only), is one component of a comprehensive MRSA colonization surveillance program. It is not intended to diagnose MRSA infection nor to guide or monitor treatment for MRSA infections. Performed at [facility], [place] [address], [place], [place] [address]      Labs: BNP (last 3 results) Recent Labs    12/14/19 0243  BNP 696.2*   Basic Metabolic Panel: Recent Labs  Lab 12/13/19 0925 12/13/19 0925 12/14/19 0242 12/14/19 0242 12/14/19 0246 12/14/19 0555 12/15/19 0429 12/16/19 0634 12/17/19 0240  NA 135   < > 134*   < > 132* 134* 135 132* 132*  K 3.4*   < > 4.6   < > 4.1  4.4 4.1 4.2 4.3  CL 97*  --  101  --   --   --  99 95* 95*  CO2 26  --  19*  --   --   --  24 25 26   GLUCOSE 127*  --  226*  --   --   --  161* 157* 160*  BUN 18  --  19  --   --   --  30* 37* 47*  CREATININE 0.56  --  1.03*  --   --   --  1.26* 1.25* 1.55*  CALCIUM 8.7*  --  8.7*  --   --   --  8.5* 8.6* 8.6*  PHOS 4.1  --   --   --   --   --   --   --   --    < > = values in this interval not displayed.   Liver Function Tests: Recent  Labs  Lab 12/13/19 0925 12/14/19 0242 12/17/19 0240  AST  --  31 24  ALT  --  18 21  ALKPHOS  --  257* 231*  BILITOT  --  0.8 0.5  PROT  --  6.3* 6.3*  ALBUMIN 2.3* 2.1* 2.0*   No results for input(s): LIPASE, AMYLASE in the last 168 hours. No results for input(s): AMMONIA in the last 168 hours. CBC: Recent Labs  Lab 12/11/19 0645 12/11/19 0645 12/12/19 0552 12/12/19 0552 12/13/19 0925 12/13/19 0925 12/14/19 0242 12/14/19 0242 12/14/19 0246 12/14/19 0555 12/15/19 0429 12/16/19 0634 12/17/19 0240  WBC 12.2*   < > 11.7*   < > 11.0*  --  15.1*  --   --   --  17.1* 16.7* 14.5*  NEUTROABS 9.4*  --  8.9*  --  8.4*  --  11.8*  --   --   --   --   --   --   HGB 10.1*   < > 10.2*   < > 10.0*   < > 10.6*   < > 10.9* 10.2* 9.2* 10.0* 10.1*  HCT 32.6*   < > 32.8*   < > 31.7*   < > 34.9*   < > 32.0* 30.0* 29.4* 32.6* 32.9*  MCV 98.5   < > 97.0   < > 97.8  --  99.4  --   --   --  97.0 97.9 98.8  PLT 381   < > 361   < > 361  --  415*  --   --   --  372 405* 407*   < > = values in this interval not displayed.   Cardiac Enzymes: No results for input(s): CKTOTAL, CKMB, CKMBINDEX, TROPONINI in the last 168 hours. BNP: Invalid input(s): POCBNP CBG: No results for input(s): GLUCAP in the last 168 hours. D-Dimer No results for input(s): DDIMER in the last 72 hours. Hgb A1c No results for input(s): HGBA1C in the last 72 hours. Lipid Profile No results for input(s): CHOL, HDL, LDLCALC, TRIG, CHOLHDL, LDLDIRECT in the last 72 hours. Thyroid function studies No results for input(s): TSH, T4TOTAL, T3FREE, THYROIDAB in the last 72 hours.  Invalid input(s): FREET3 Anemia work up No results for input(s): VITAMINB12, FOLATE, FERRITIN, TIBC, IRON, RETICCTPCT in the last 72 hours. Urinalysis    Component Value Date/Time   COLORURINE YELLOW 12/15/2019 0258   APPEARANCEUR CLEAR 12/15/2019 0258   LABSPEC 1.017 12/15/2019 0258   PHURINE 5.0 12/15/2019 [place] 12/15/2019  0258  HGBUR NEGATIVE 12/15/2019 0258   BILIRUBINUR NEGATIVE 12/15/2019 0258   BILIRUBINUR negative 01/23/2019 1136   KETONESUR NEGATIVE 12/15/2019 0258   PROTEINUR NEGATIVE 12/15/2019 0258   UROBILINOGEN 0.2 01/23/2019 1136   NITRITE NEGATIVE 12/15/2019 0258   LEUKOCYTESUR NEGATIVE 12/15/2019 0258   Sepsis Labs Invalid input(s): PROCALCITONIN,  WBC,  LACTICIDVEN Microbiology Recent Results (from the past 240 hour(s))  Culture, blood (routine x 2) Call MD if unable to obtain prior to antibiotics being given     Status: None   Collection Time: 12/08/19 12:55 AM   Specimen: BLOOD  Result Value Ref Range Status   Specimen Description   Final    BLOOD RIGHT WRIST Performed at [facility] [address] [place], [place] [address]    Special Requests   Final    BOTTLES DRAWN AEROBIC AND ANAEROBIC Blood Culture adequate volume Performed at [facility] [address], [place], [place] [address]    Culture   Final    NO GROWTH 5 DAYS Performed at [facility], [place] [address], [place], [place] [address]    Report Status 12/13/2019 FINAL  Final  SARS Coronavirus 2 by RT PCR (hospital order, performed in [facility] hospital lab) Nasopharyngeal Nasopharyngeal Swab     Status: None   Collection Time: 12/14/19  2:42 AM   Specimen: Nasopharyngeal Swab  Result Value Ref Range Status   SARS Coronavirus 2 NEGATIVE NEGATIVE Final    Comment: (NOTE) SARS-CoV-2 target nucleic acids are NOT DETECTED.  The SARS-CoV-2 RNA is generally detectable in upper and lower respiratory specimens during the acute phase of infection. The lowest concentration of SARS-CoV-2 viral copies this assay can detect is 250 copies / mL. A negative result does not preclude SARS-CoV-2 infection and should not be used as the sole basis for treatment or other patient management decisions.  A negative result may occur with improper specimen collection / handling, submission  of specimen other than nasopharyngeal swab, presence of viral mutation(s) within the areas targeted by this assay, and inadequate number of viral copies (<250 copies / mL). A negative result must be combined with clinical observations, patient history, and epidemiological information.  Fact Sheet for Patients:   StrictlyIdeas.no  Fact Sheet for Healthcare Providers: BankingDealers.co.za  This test is not yet approved or  cleared by the [place] FDA and has been authorized for detection and/or diagnosis of SARS-CoV-2 by FDA under an Emergency Use Authorization (EUA).  This EUA will remain in effect (meaning this test can be used) for the duration of the COVID-19 declaration under Section 564(b)(1) of the Act, 21 U.S.C. section 360bbb-3(b)(1), unless the authorization is terminated or revoked sooner.  Performed at [facility], [place] [address], [place], [place] [address]   Blood culture (routine x 2)     Status: None (Preliminary result)   Collection Time: 12/14/19  5:31 AM   Specimen: BLOOD  Result Value Ref Range Status   Specimen Description BLOOD LEFT ANTECUBITAL  Final   Special Requests   Final    BOTTLES DRAWN AEROBIC AND ANAEROBIC Blood Culture results may not be optimal due to an excessive volume of blood received in culture bottles   Culture   Final    NO GROWTH 3 DAYS Performed at [facility], [place] [address], [place],  [address]    Report Status PENDING  Incomplete  Blood culture (routine x 2)     Status: None (Preliminary result)  Collection Time: 12/14/19  5:32 AM   Specimen: BLOOD RIGHT HAND  Result Value Ref Range Status   Specimen Description BLOOD RIGHT HAND  Final   Special Requests   Final    BOTTLES DRAWN AEROBIC AND ANAEROBIC Blood Culture results may not be optimal due to an inadequate volume of blood received in culture bottles   Culture   Final    NO GROWTH 3 DAYS Performed at  [facility], [place] [address] [place], [place] [address]    Report Status PENDING  Incomplete  Culture, Urine     Status: None   Collection Time: 12/15/19  2:41 AM   Specimen: Urine, Random  Result Value Ref Range Status   Specimen Description URINE, RANDOM  Final   Special Requests NONE  Final   Culture   Final    NO GROWTH Performed at [facility], [place] [address] [place], [place] [address]    Report Status 12/16/2019 FINAL  Final  MRSA PCR Screening     Status: None   Collection Time: 12/17/19 11:23 AM   Specimen: Nasal Mucosa; Nasopharyngeal  Result Value Ref Range Status   MRSA by PCR NEGATIVE NEGATIVE Final    Comment:        The GeneXpert MRSA Assay (FDA approved for NASAL specimens only), is one component of a comprehensive MRSA colonization surveillance program. It is not intended to diagnose MRSA infection nor to guide or monitor treatment for MRSA infections. Performed at [facility], [place] [address], [place], [place] [address]      Time coordinating discharge: 35 minutes The [place] controlled substances registry was reviewed, although a longer than 5 days prescription was written because she is a hospice patient.      SIGNED:   Edwin Dada, MD  [facility] Hospitalists 12/17/2019, 4:56 PM

## 2019-12-17 NOTE — Progress Notes (Signed)
[facility]  All necessary DME is already in the home.  Tentative plans to d/c pt today.  Will arrange for hospice RN to meet at the home once the pt is discharged.  Venia Carbon RN, BSN, [facility] Liaison

## 2019-12-17 NOTE — Plan of Care (Signed)

## 2019-12-17 NOTE — Plan of Care (Signed)

## 2019-12-17 NOTE — Consult Note (Signed)
   [facility] CM Inpatient Consult   12/17/2019  [name] 15-Jan-1941 [id number]   [place] [facility] [ACO] Patient: Connie Lang [facility]    Patient screened for high risk score for unplanned readmission score and for less than 7 day readmission hospitalizations to check if potential [facility] Management service needs.  Review of patient's medical record reveals patient is for home with hospice care.  Primary Care Provider is Alysia Penna, MD   Plan:  Chart reviewed and reveals the patient with Hospice/Palliative Care through [name] and equipment is in the home per Hospice notes.   Given this choice patient will have full case management services through Hospice and Management consultant will sign off at transition from the hospital.   For questions contact:   Natividad Brood, RN BSN [facility] Liaison  [phone] business mobile phone Toll free office [phone]  Fax number: [phone] [email or web address]

## 2019-12-18 ENCOUNTER — Telehealth: Payer: Self-pay | Admitting: Family Medicine

## 2019-12-18 LAB — BODY FLUID CELL COUNT WITH DIFFERENTIAL
Eos, Fluid: 0 %
Lymphs, Fluid: 77 %
Monocyte-Macrophage-Serous Fluid: 7 % — ABNORMAL LOW (ref 50–90)
Neutrophil Count, Fluid: 16 % (ref 0–25)
Total Nucleated Cell Count, Fluid: 698 cu mm (ref 0–1000)

## 2019-12-18 LAB — CYTOLOGY - NON PAP

## 2019-12-18 NOTE — Telephone Encounter (Signed)
Connie Lang from Hospice Case manager needs a verbal order for a cathedral   Connie can be reached at [phone]

## 2019-12-19 LAB — CULTURE, BLOOD (ROUTINE X 2)
Culture: NO GROWTH
Culture: NO GROWTH

## 2019-12-19 NOTE — Telephone Encounter (Signed)
Orders have been given. Nothing further needed.

## 2019-12-19 NOTE — Telephone Encounter (Signed)
I am not sure what this means. Please get more information

## 2019-12-19 NOTE — Telephone Encounter (Signed)
This is for a catheter.

## 2019-12-19 NOTE — Telephone Encounter (Signed)
Please okay the order for an indwelling catheter

## 2019-12-21 ENCOUNTER — Telehealth: Payer: Self-pay | Admitting: Family Medicine

## 2019-12-21 NOTE — Telephone Encounter (Signed)
Patient is actively dying and is very agitated.  Hospice is wanting to add medication to help patient rest.

## 2019-12-21 NOTE — Telephone Encounter (Signed)
Spoke with Lollie Sails and another provider has already taken care of this. Nothing further needed.

## 2019-12-22 LAB — CULTURE, BODY FLUID W GRAM STAIN -BOTTLE: Culture: NO GROWTH

## 2020-01-02 DIAGNOSIS — C7951 Secondary malignant neoplasm of bone: Secondary | ICD-10-CM | POA: Diagnosis not present

## 2020-01-02 DIAGNOSIS — Z853 Personal history of malignant neoplasm of breast: Secondary | ICD-10-CM | POA: Diagnosis not present

## 2020-01-02 DIAGNOSIS — I10 Essential (primary) hypertension: Secondary | ICD-10-CM | POA: Diagnosis not present

## 2020-01-02 DIAGNOSIS — C3492 Malignant neoplasm of unspecified part of left bronchus or lung: Secondary | ICD-10-CM | POA: Diagnosis not present

## 2020-01-03 DEATH — deceased

## 2020-01-10 ENCOUNTER — Telehealth: Payer: Self-pay

## 2020-01-24 ENCOUNTER — Encounter: Payer: Medicare Other | Admitting: Family Medicine

## 2020-08-10 IMAGING — XA DG MYELOGRAPHY LUMBAR INJ THORACIC
7 series · 7 of 7 positions shown · non-contrast
Comparison: Thoracic spine MRI 07/19/2019

CLINICAL DATA: SRS treatment planning.
TECHNIQUE: Contiguous axial images were obtained through the Thoracic spine
after the intrathecal infusion of infusion. Coronal and sagittal
reconstructions were obtained of the axial image sets.

[Series 1: vasc adipose · 1 of 1 slices shown (1 of 7)]
[im 1/1]
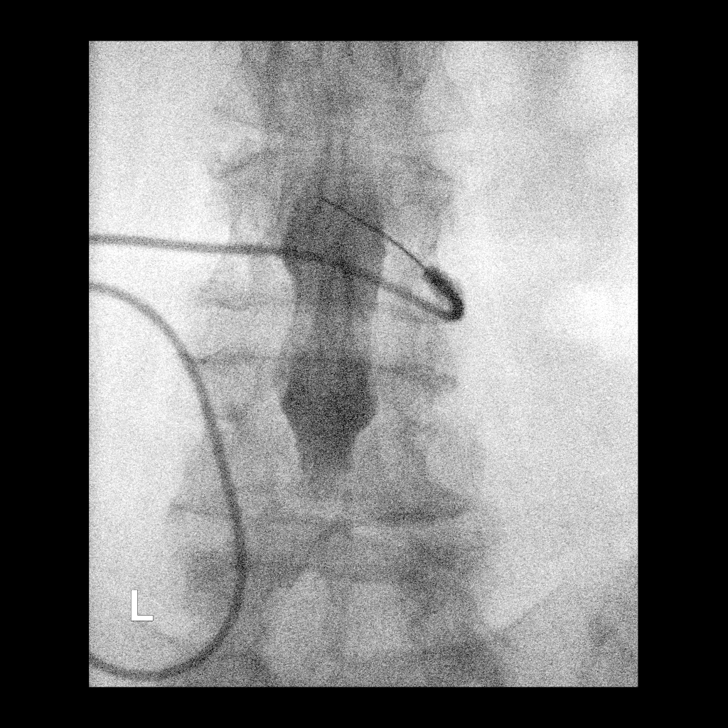

[Series 2: vasc adipose · 1 of 1 slices shown (2 of 7)]
[im 1/1]
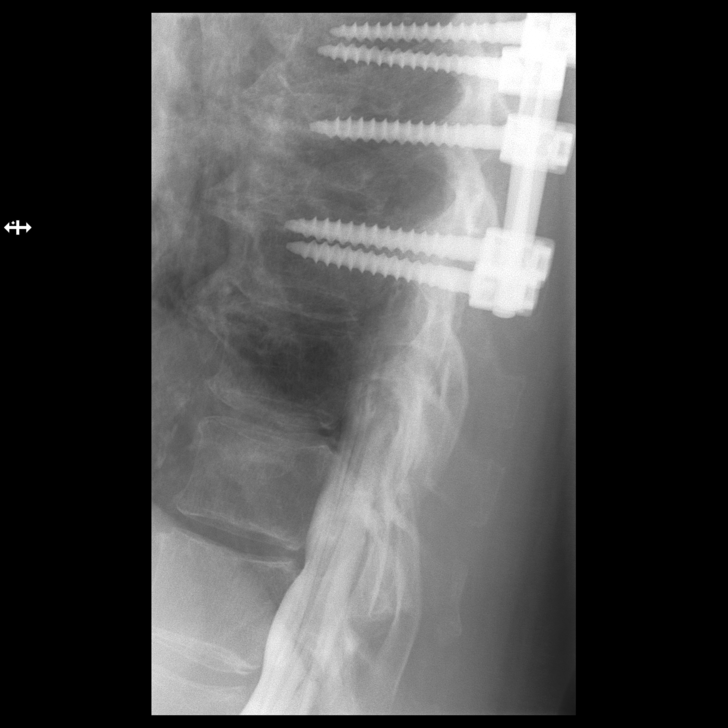

[Series 3: vasc adipose · 1 of 1 slices shown (3 of 7)]
[im 1/1]
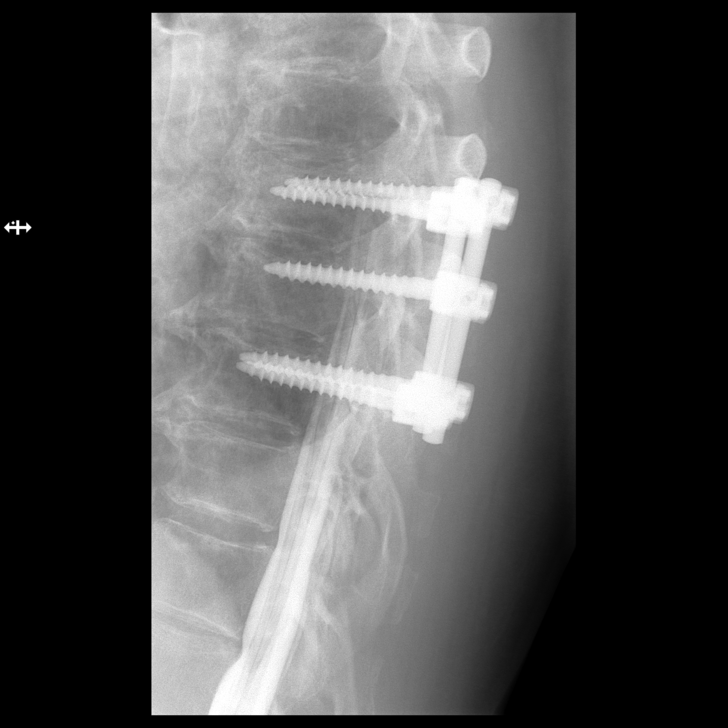

[Series 4: vasc adipose · 1 of 1 slices shown (4 of 7)]
[im 1/1]
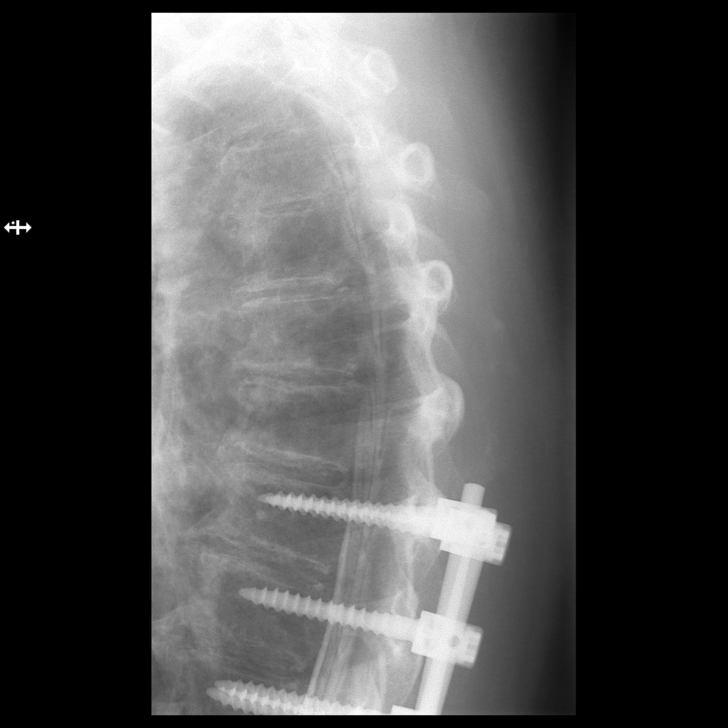

[Series 5: vasc adipose · 1 of 1 slices shown (5 of 7)]
[im 1/1]
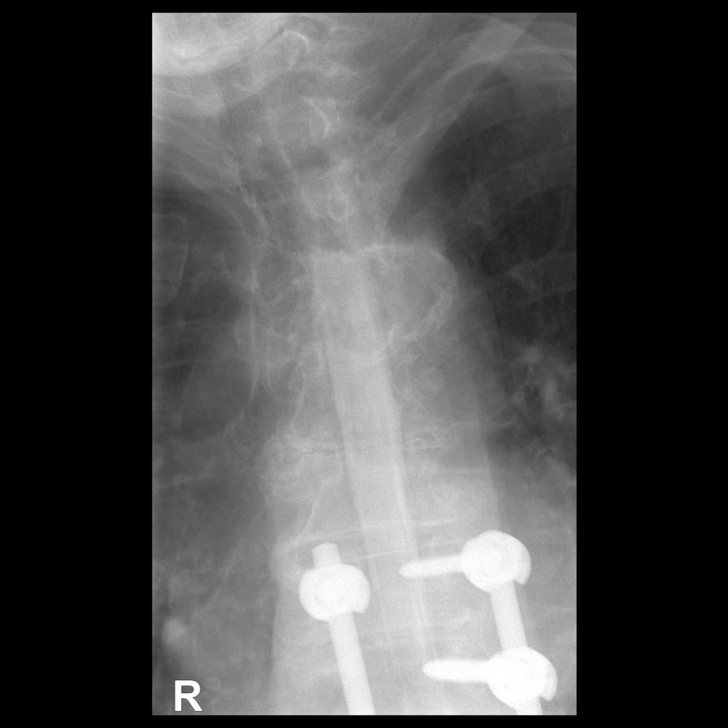

[Series 6: vasc adipose · 1 of 1 slices shown (6 of 7)]
[im 1/1]
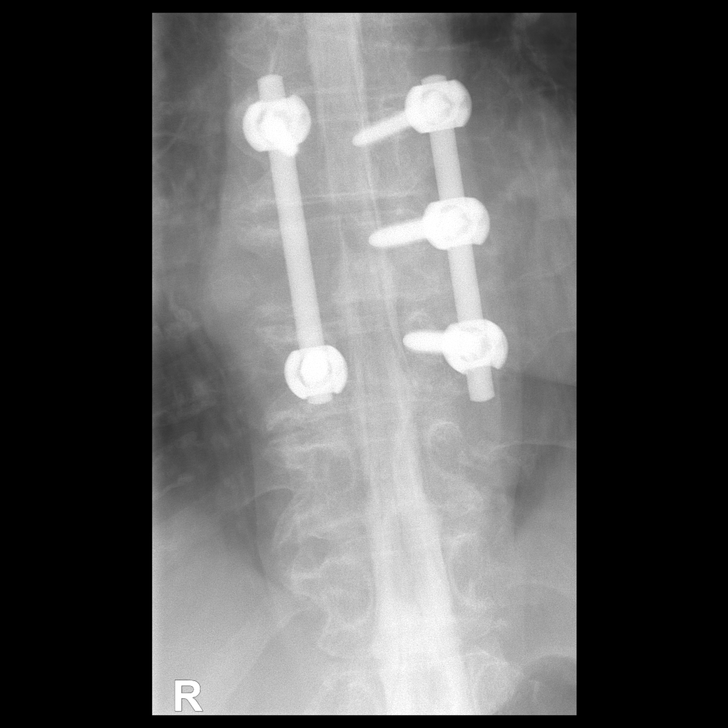

[Series 7: vasc adipose · 1 of 1 slices shown (7 of 7)]
[im 1/1]
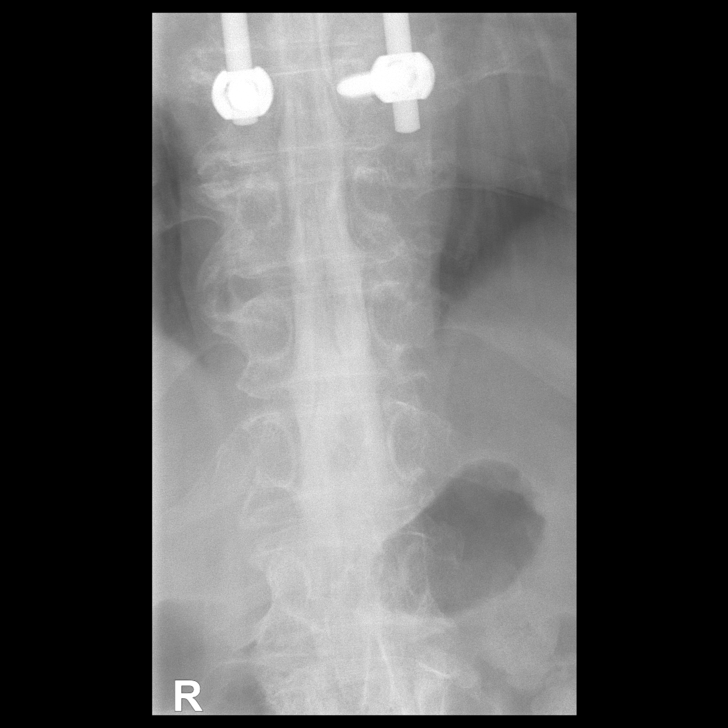

[7 of 7 positions shown; findings below may reference images not displayed]

FLUOROSCOPY TIME:  Fluoroscopy Time: 52 seconds

Radiation Exposure Index: 181.58 microGray*m^2

PROCEDURE:
LUMBAR PUNCTURE FOR THORACIC MYELOGRAM

After thorough discussion of risks and benefits of the procedure
including bleeding, infection, injury to nerves, blood vessels,
adjacent structures as well as headache and CSF leak, written and
oral informed consent was obtained. Consent was obtained by Dr.
Elif Vi.

Patient was positioned prone on the fluoroscopy table. Local
anesthesia was provided with 1% lidocaine without epinephrine after
prepped and draped in the usual sterile fashion. Puncture was
performed at L2-3 using a 3 1/2 inch 22-gauge spinal needle via a
right interlaminar approach. Using a single pass through the dura,
the needle was placed within the thecal sac, with return of clear
CSF. 10 mL of Isovue Y-AAA was injected into the thecal sac, with
normal opacification of the nerve roots and cauda equina consistent
with free flow within the subarachnoid space. The patient was then
moved to the trendelenburg position and contrast flowed into the
Thoracic spine region.

I personally performed the lumbar puncture and administered the
intrathecal contrast. I also personally supervised acquisition of
the myelogram images.
FINDINGS: THORACIC MYELOGRAM FINDINGS:

Sequelae of T7-T9 posterior fusion are identified without evidence
of compressive spinal stenosis. Scattered small ventral extradural
defects are noted elsewhere in the thoracic spine with detailed
assessment of degenerative changes deferred to CT.

CT THORACIC MYELOGRAM FINDINGS:

There is 3 mm anterolisthesis of T2 on T3 and trace anterolisthesis
of T4 on T5. No acute fracture is identified. Aortic
atherosclerosis, coronary atherosclerosis, and a small sliding
hiatal hernia are noted.

Sequelae of interval T7-T9 posterior fusion are identified including
right-sided laminectomy and facetectomies at T8. There are bilateral
pedicle screws at T7 and T9 and a left-sided screw at T8. The screws
appear well-positioned without evidence of loosening. Bone graft
material is noted along the posterior elements on the left. A
destructive lesion is again seen in the right aspect of the T8
vertebral body with involvement of the superior and inferior
endplates as well as posterior and lateral vertebral body cortex.
Soft tissue lateral to the T8 vertebral body on the right and in the
right ventrolateral and dorsolateral epidural space may reflect a
combination of extraosseous tumor and postoperative changes. This
results in mild spinal stenosis at T7-8 with mild flattening of the
right ventral aspect of the spinal cord.

C6-7: Interbody and bilateral facet ankylosis. No stenosis.

C7-T1: Mild to moderate disc space narrowing. Mild disc bulging,
endplate spurring, and mild-to-moderate facet arthrosis result in
mild left neural foraminal stenosis without spinal stenosis.

T1-2: Mild-to-moderate disc space narrowing. Disc bulging, endplate
spurring, and moderate right and mild left facet arthrosis without
significant stenosis.

T2-3: Mild-to-moderate disc space narrowing with vacuum disc.
Anterolisthesis with bulging uncovered disc, a moderate-sized
central pseudo disc extrusion, and moderate facet arthrosis result
in mild spinal stenosis with mild cord flattening. No significant
neural foraminal stenosis.

T3-4: Severe disc space narrowing. Endplate spurring, disc space
height loss, and facet arthrosis without significant stenosis.
Bilateral facet ankylosis.

T4-5: Mild-to-moderate disc space narrowing and vacuum disc. Mild
disc bulging and severe right and mild left facet arthrosis without
stenosis.

T5-6: Mild-to-moderate disc space narrowing with vacuum disc.
Moderate right and mild left facet arthrosis and a tiny central disc
protrusion without stenosis.

T6-7: Moderate facet arthrosis without disc herniation or stenosis.

T7-8: Posterior decompression and fusion with extraosseous tumor and
mild spinal stenosis as above.

T8-9: Posterior fusion. Effacement of the right neural foramen by
tumor. No spinal stenosis.

T9-10: Mild right and moderate to severe left facet arthrosis
without disc herniation or stenosis.

T10-11: Tiny calcified central disc protrusion and mild-to-moderate
facet arthrosis without stenosis.

T11-12: Mild disc bulging and moderate right and mild left facet
arthrosis without stenosis.

T12-L1: Shallow, broad, partially calcified central disc protrusion
without stenosis.
IMPRESSION: 1. Known destructive tumor involving the right aspect of the T8
vertebral body status post interval T7-T9 posterior decompression
fusion. Mild spinal stenosis at T7-8 due to epidural tumor.
2. Multilevel disc and advanced facet degeneration in the thoracic
spine with grade 1 anterolisthesis and mild spinal stenosis at T2-3.

Aortic Atherosclerosis ([id number].I).
